# Patient Record
Sex: Female | Born: 1950 | Race: White | Hispanic: No | Smoking: Current every day smoker
Health system: Southern US, Community
[De-identification: ages and names within clinical notes are randomized; demographics above are authoritative.]

## PROBLEM LIST (undated history)

## (undated) DIAGNOSIS — C801 Malignant (primary) neoplasm, unspecified: Secondary | ICD-10-CM

## (undated) DIAGNOSIS — F419 Anxiety disorder, unspecified: Secondary | ICD-10-CM

## (undated) DIAGNOSIS — I209 Angina pectoris, unspecified: Secondary | ICD-10-CM

## (undated) DIAGNOSIS — J189 Pneumonia, unspecified organism: Secondary | ICD-10-CM

## (undated) DIAGNOSIS — I2510 Atherosclerotic heart disease of native coronary artery without angina pectoris: Secondary | ICD-10-CM

## (undated) DIAGNOSIS — I10 Essential (primary) hypertension: Secondary | ICD-10-CM

## (undated) DIAGNOSIS — M1990 Unspecified osteoarthritis, unspecified site: Secondary | ICD-10-CM

## (undated) DIAGNOSIS — K219 Gastro-esophageal reflux disease without esophagitis: Secondary | ICD-10-CM

## (undated) HISTORY — PX: THYROID LOBECTOMY: SHX420

## (undated) HISTORY — PX: CARDIAC CATHETERIZATION: SHX172

---

## 2012-09-10 ENCOUNTER — Emergency Department: Payer: Self-pay | Admitting: Emergency Medicine

## 2012-09-10 LAB — CBC
HGB: 13.8 g/dL (ref 12.0–16.0)
MCH: 26.8 pg (ref 26.0–34.0)
MCV: 80 fL (ref 80–100)
RBC: 5.14 10*6/uL (ref 3.80–5.20)
RDW: 16.3 % — ABNORMAL HIGH (ref 11.5–14.5)
WBC: 5.7 10*3/uL (ref 3.6–11.0)

## 2012-09-10 LAB — BASIC METABOLIC PANEL
Anion Gap: 8 (ref 7–16)
BUN: 6 mg/dL — ABNORMAL LOW (ref 7–18)
Calcium, Total: 8.8 mg/dL (ref 8.5–10.1)
Chloride: 104 mmol/L (ref 98–107)
Co2: 25 mmol/L (ref 21–32)
Creatinine: 0.64 mg/dL (ref 0.60–1.30)
EGFR (African American): >60
Glucose: 107 mg/dL — ABNORMAL HIGH (ref 65–99)
Potassium: 3.7 mmol/L (ref 3.5–5.1)
Sodium: 137 mmol/L (ref 136–145)

## 2012-09-10 LAB — CK TOTAL AND CKMB (NOT AT ARMC)
CK, Total: 99 U/L (ref 21–215)
CK-MB: 1.4 ng/mL (ref 0.5–3.6)

## 2012-09-10 IMAGING — CR DG CHEST 2V
1 series · 3 of 3 positions shown · non-contrast
Comparison: none

REASON FOR EXAM: SOB
COMMENTS:

PROCEDURE:     DXR - DXR CHEST PA (OR AP) AND LATERAL  - [DATE]  [DATE]
RESULT:     Comparison: None.

[Series 1: w chest pa · 0.14mm/px · 3 of 3 slices shown]
[im 1/3]
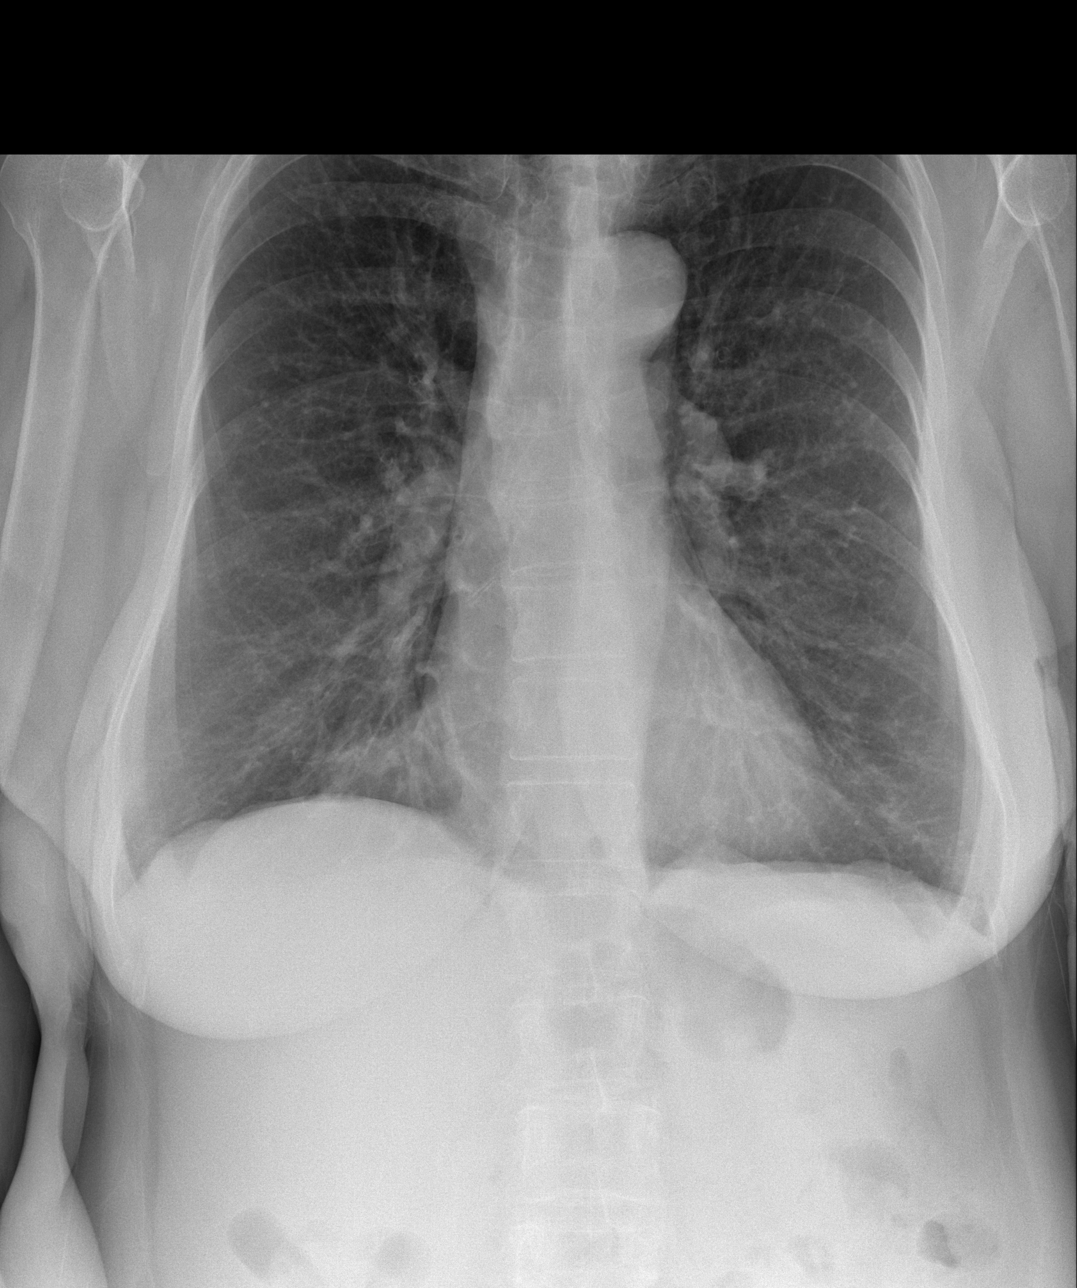
[im 2/3]
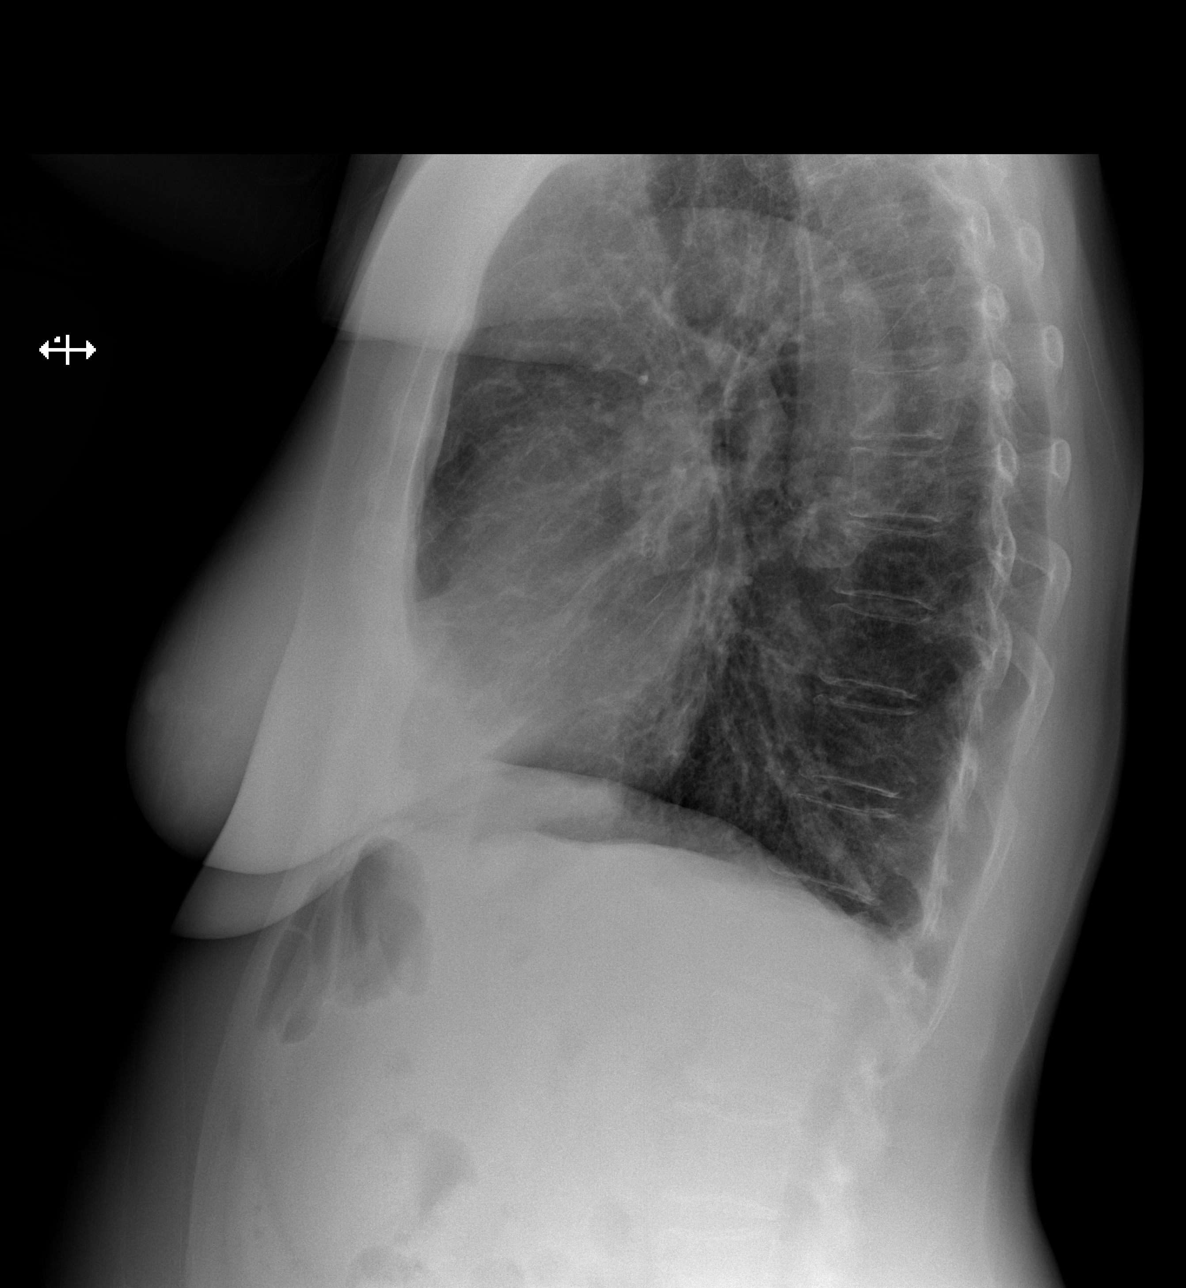
[im 3/3]
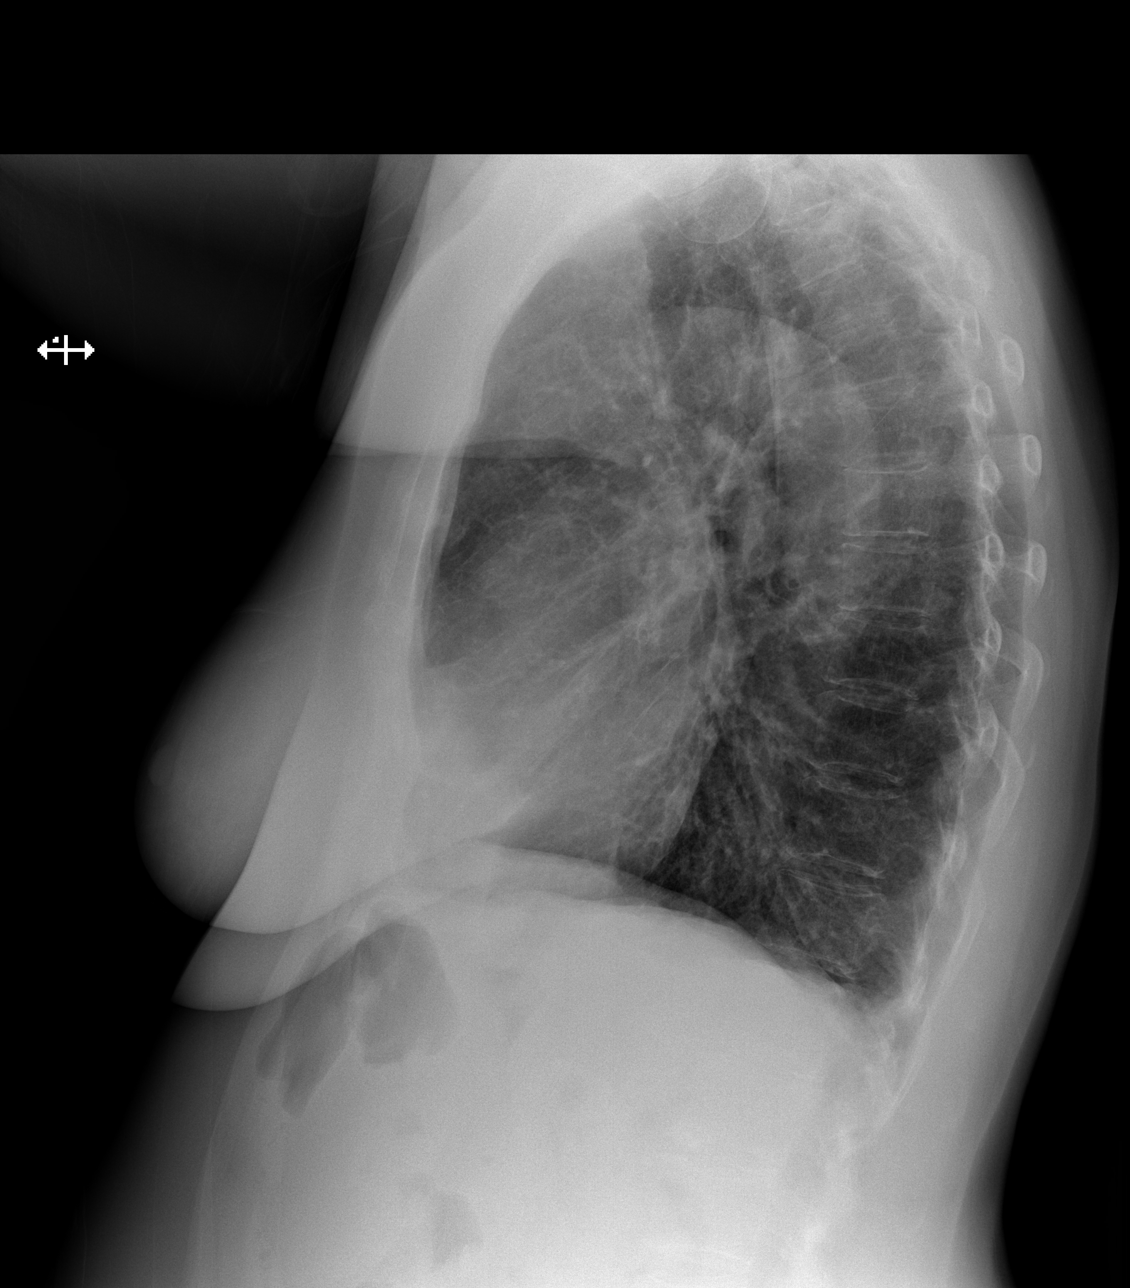

[3 of 3 positions shown; findings below may reference images not displayed]

FINDINGS: The heart and mediastinum are within normal limits. There is mild opacity in
the medial right lower lung, likely the right middle lobe. The left lung is
clear.
IMPRESSION: There is mild heterogeneous opacity in the medial right lower lung. This
could represent infection in the appropriate clinical setting. Followup PA
and lateral chest radiograph is recommended to ensure resolution.

[REDACTED]

## 2014-12-02 ENCOUNTER — Ambulatory Visit
Admit: 2014-12-02 | Disposition: A | Discharge status: Home / Self Care | Payer: Self-pay | Attending: Internal Medicine | Admitting: Internal Medicine

## 2017-05-28 ENCOUNTER — Observation Stay: Payer: Commercial Managed Care - PPO

## 2017-05-28 ENCOUNTER — Observation Stay
Admission: EM | Admit: 2017-05-28 | Discharge: 2017-05-29 | Disposition: A | Discharge status: Home / Self Care | Payer: Commercial Managed Care - PPO | Attending: Cardiology | Admitting: Cardiology

## 2017-05-28 ENCOUNTER — Encounter: Payer: Self-pay | Admitting: Emergency Medicine

## 2017-05-28 ENCOUNTER — Encounter
Admission: EM | Disposition: A | Discharge status: Home / Self Care | Payer: Self-pay | Source: Home / Self Care | Attending: Emergency Medicine

## 2017-05-28 ENCOUNTER — Emergency Department: Payer: Commercial Managed Care - PPO

## 2017-05-28 DIAGNOSIS — M069 Rheumatoid arthritis, unspecified: Secondary | ICD-10-CM | POA: Insufficient documentation

## 2017-05-28 DIAGNOSIS — J209 Acute bronchitis, unspecified: Secondary | ICD-10-CM | POA: Insufficient documentation

## 2017-05-28 DIAGNOSIS — I2510 Atherosclerotic heart disease of native coronary artery without angina pectoris: Secondary | ICD-10-CM | POA: Diagnosis not present

## 2017-05-28 DIAGNOSIS — R079 Chest pain, unspecified: Secondary | ICD-10-CM | POA: Diagnosis present

## 2017-05-28 DIAGNOSIS — J440 Chronic obstructive pulmonary disease with acute lower respiratory infection: Secondary | ICD-10-CM | POA: Insufficient documentation

## 2017-05-28 DIAGNOSIS — Z79899 Other long term (current) drug therapy: Secondary | ICD-10-CM | POA: Diagnosis not present

## 2017-05-28 DIAGNOSIS — Z885 Allergy status to narcotic agent status: Secondary | ICD-10-CM | POA: Diagnosis not present

## 2017-05-28 DIAGNOSIS — R9431 Abnormal electrocardiogram [ECG] [EKG]: Secondary | ICD-10-CM | POA: Insufficient documentation

## 2017-05-28 DIAGNOSIS — F17210 Nicotine dependence, cigarettes, uncomplicated: Secondary | ICD-10-CM | POA: Insufficient documentation

## 2017-05-28 DIAGNOSIS — E785 Hyperlipidemia, unspecified: Secondary | ICD-10-CM | POA: Insufficient documentation

## 2017-05-28 DIAGNOSIS — I10 Essential (primary) hypertension: Secondary | ICD-10-CM | POA: Insufficient documentation

## 2017-05-28 HISTORY — PX: CORONARY/GRAFT ACUTE MI REVASCULARIZATION: CATH118305

## 2017-05-28 HISTORY — DX: Unspecified osteoarthritis, unspecified site: M19.90

## 2017-05-28 HISTORY — DX: Essential (primary) hypertension: I10

## 2017-05-28 HISTORY — PX: LEFT HEART CATH AND CORONARY ANGIOGRAPHY: CATH118249

## 2017-05-28 LAB — PROTIME-INR
INR: 0.9
Prothrombin Time: 12.1 seconds (ref 11.4–15.2)

## 2017-05-28 LAB — CBC
HCT: 45.9 % (ref 35.0–47.0)
Hemoglobin: 16 g/dL (ref 12.0–16.0)
MCH: 30.1 pg (ref 26.0–34.0)
MCHC: 34.8 g/dL (ref 32.0–36.0)
MCV: 86.7 fL (ref 80.0–100.0)
PLATELETS: 351 10*3/uL (ref 150–440)
RBC: 5.3 MIL/uL — ABNORMAL HIGH (ref 3.80–5.20)
RDW: 14.6 % — ABNORMAL HIGH (ref 11.5–14.5)
WBC: 13.1 10*3/uL — ABNORMAL HIGH (ref 3.6–11.0)

## 2017-05-28 LAB — BASIC METABOLIC PANEL
Anion gap: 8 (ref 5–15)
BUN: 11 mg/dL (ref 6–20)
CO2: 24 mmol/L (ref 22–32)
CREATININE: 0.64 mg/dL (ref 0.44–1.00)
Calcium: 9.2 mg/dL (ref 8.9–10.3)
Chloride: 105 mmol/L (ref 101–111)
GFR calc Af Amer: >60 mL/min (ref >60)
GLUCOSE: 123 mg/dL — AB (ref 65–99)
POTASSIUM: 4.1 mmol/L (ref 3.5–5.1)
Sodium: 137 mmol/L (ref 135–145)

## 2017-05-28 LAB — LIPID PANEL
Cholesterol: 191 mg/dL (ref 0–200)
HDL: 43 mg/dL (ref >40)
LDL CALC: 136 mg/dL — AB (ref 0–99)
Total CHOL/HDL Ratio: 4.4 RATIO
Triglycerides: 60 mg/dL (ref <150)
VLDL: 12 mg/dL (ref 0–40)

## 2017-05-28 LAB — GLUCOSE, CAPILLARY: GLUCOSE-CAPILLARY: 122 mg/dL — AB (ref 65–99)

## 2017-05-28 LAB — MRSA PCR SCREENING: MRSA BY PCR: NEGATIVE

## 2017-05-28 LAB — APTT: aPTT: 36 seconds (ref 24–36)

## 2017-05-28 LAB — TROPONIN I

## 2017-05-28 LAB — PROCALCITONIN: Procalcitonin: <0.1 ng/mL

## 2017-05-28 IMAGING — DX DG CHEST 1V
1 series · 1 of 1 positions shown · non-contrast
Comparison: [DATE].

CLINICAL DATA: 66-year-old female with chest pain today.

EXAM:
CHEST 1 VIEW

[chest ap]
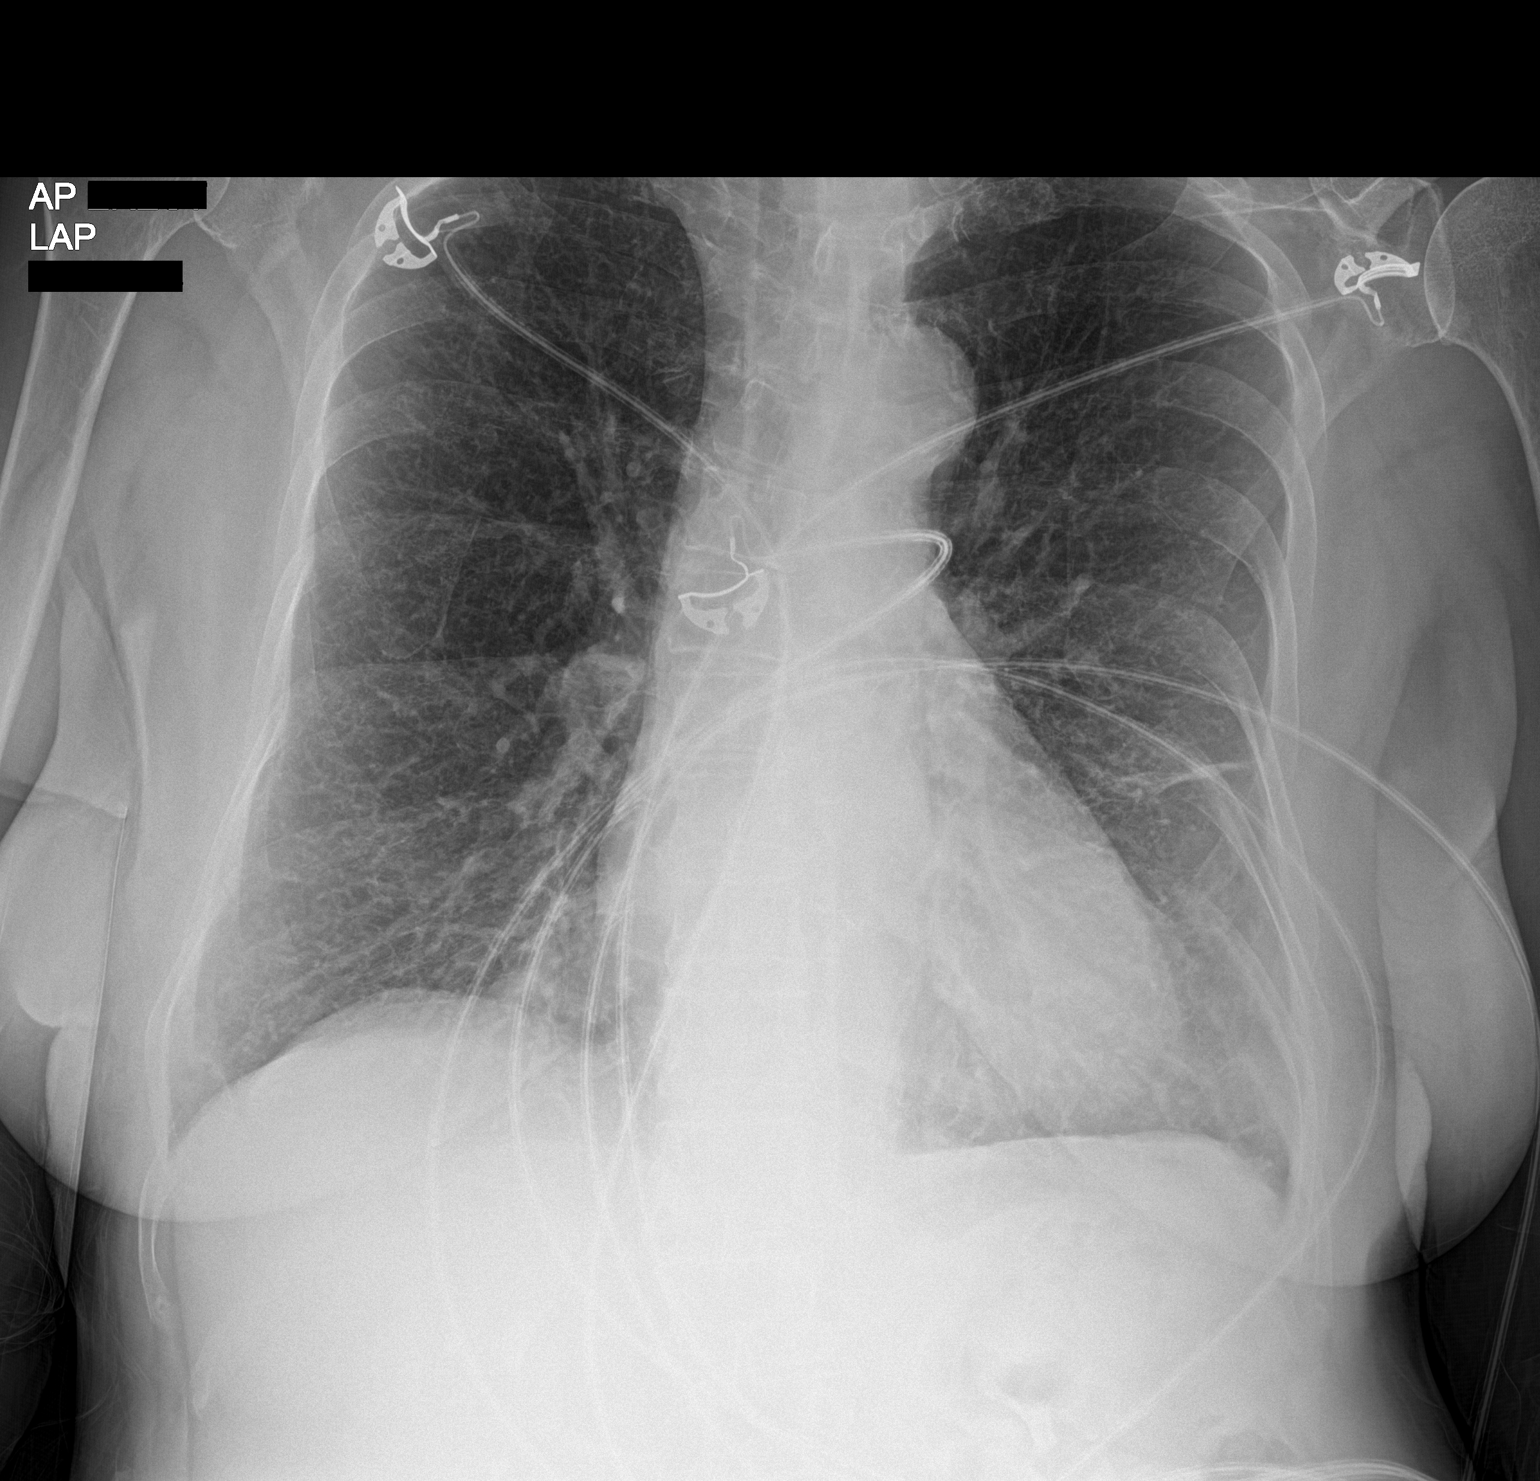

[1 of 1 positions shown; findings below may reference images not displayed]

FINDINGS: Portable AP upright view at [42] hours. Stable cardiac size at the
upper limits of normal. Other mediastinal contours are within normal
limits. Visualized tracheal air column is within normal limits.
Chronic large lung volumes. Mild increased interstitial markings in
both lungs appear increased. Mild linear atelectasis or scarring in
the left mid lung. No pneumothorax, pulmonary edema or pleural
effusion. Thoracic scoliosis. Negative visible bowel gas pattern.
IMPRESSION: 1. Increased interstitial markings in both lungs such that mild or
developing interstitial edema is difficult to exclude. Alternatively
these could be chronic, as there is evidence of hyperinflation.
2. Linear scarring or atelectasis suspected in the left mid lung.
3. Borderline to mild cardiomegaly.

## 2017-05-28 SURGERY — LEFT HEART CATH AND CORONARY ANGIOGRAPHY
Anesthesia: Moderate Sedation

## 2017-05-28 MED ORDER — MIDAZOLAM HCL 2 MG/2ML IJ SOLN
INTRAMUSCULAR | Status: AC
  Filled 2017-05-28: qty 2

## 2017-05-28 MED ORDER — AZITHROMYCIN 250 MG PO TABS: 250.0000 mg | ORAL_TABLET | Freq: Every day | ORAL | Status: DC

## 2017-05-28 MED ORDER — HEPARIN SODIUM (PORCINE) 1000 UNIT/ML IJ SOLN
4000.0000 [IU] | Freq: Once | INTRAMUSCULAR | Status: AC
  Administered 2017-05-28: 4000 [IU] via INTRAVENOUS

## 2017-05-28 MED ORDER — ASPIRIN 300 MG RE SUPP
300.0000 mg | RECTAL | Status: AC
  Filled 2017-05-28: qty 1

## 2017-05-28 MED ORDER — ACETAMINOPHEN 325 MG PO TABS: 650.0000 mg | ORAL_TABLET | ORAL | Status: DC | PRN

## 2017-05-28 MED ORDER — ENOXAPARIN SODIUM 40 MG/0.4ML ~~LOC~~ SOLN
40.0000 mg | SUBCUTANEOUS | Status: DC
  Administered 2017-05-28 – 2017-05-29 (×2): 40 mg via SUBCUTANEOUS
  Filled 2017-05-28 (×2): qty 0.4

## 2017-05-28 MED ORDER — HEPARIN SODIUM (PORCINE) 5000 UNIT/ML IJ SOLN: 60.0000 [IU]/kg | Freq: Once | INTRAMUSCULAR | Status: DC

## 2017-05-28 MED ORDER — SODIUM CHLORIDE 0.9 % WEIGHT BASED INFUSION: 1.0000 mL/kg/h | INTRAVENOUS | Status: DC

## 2017-05-28 MED ORDER — SODIUM CHLORIDE 0.9 % IV SOLN: INTRAVENOUS | Status: DC

## 2017-05-28 MED ORDER — NITROGLYCERIN 0.4 MG SL SUBL
0.4000 mg | SUBLINGUAL_TABLET | Freq: Once | SUBLINGUAL | Status: AC
  Administered 2017-05-28: 0.4 mg via SUBLINGUAL

## 2017-05-28 MED ORDER — NITROGLYCERIN 5 MG/ML IV SOLN
INTRAVENOUS | Status: AC
  Filled 2017-05-28: qty 10

## 2017-05-28 MED ORDER — SODIUM CHLORIDE 0.9 % IV SOLN: 250.0000 mL | INTRAVENOUS | Status: DC | PRN

## 2017-05-28 MED ORDER — IBUPROFEN 400 MG PO TABS
400.0000 mg | ORAL_TABLET | Freq: Four times a day (QID) | ORAL | Status: DC | PRN
  Administered 2017-05-28: 400 mg via ORAL
  Filled 2017-05-28: qty 1

## 2017-05-28 MED ORDER — ONDANSETRON HCL 4 MG/2ML IJ SOLN
4.0000 mg | Freq: Four times a day (QID) | INTRAMUSCULAR | Status: DC | PRN
  Administered 2017-05-28: 4 mg via INTRAVENOUS
  Filled 2017-05-28: qty 2

## 2017-05-28 MED ORDER — SODIUM CHLORIDE 0.9% FLUSH: 3.0000 mL | INTRAVENOUS | Status: DC | PRN

## 2017-05-28 MED ORDER — ASPIRIN 81 MG PO CHEW
324.0000 mg | CHEWABLE_TABLET | Freq: Once | ORAL | Status: AC
  Administered 2017-05-28: 324 mg via ORAL

## 2017-05-28 MED ORDER — ACETAMINOPHEN 325 MG PO TABS
650.0000 mg | ORAL_TABLET | Freq: Four times a day (QID) | ORAL | Status: DC | PRN
  Administered 2017-05-29: 650 mg via ORAL
  Filled 2017-05-28: qty 2

## 2017-05-28 MED ORDER — SODIUM CHLORIDE 0.9 % IV SOLN
INTRAVENOUS | Status: DC
  Administered 2017-05-28: 07:00:00 via INTRAVENOUS

## 2017-05-28 MED ORDER — ALBUTEROL SULFATE (2.5 MG/3ML) 0.083% IN NEBU: 2.5000 mg | INHALATION_SOLUTION | RESPIRATORY_TRACT | Status: DC | PRN

## 2017-05-28 MED ORDER — ASPIRIN 81 MG PO CHEW: 324.0000 mg | CHEWABLE_TABLET | ORAL | Status: AC

## 2017-05-28 MED ORDER — SODIUM CHLORIDE 0.9% FLUSH
3.0000 mL | Freq: Two times a day (BID) | INTRAVENOUS | Status: DC
  Administered 2017-05-28 – 2017-05-29 (×2): 3 mL via INTRAVENOUS

## 2017-05-28 MED ORDER — ACETAMINOPHEN 650 MG RE SUPP
650.0000 mg | Freq: Four times a day (QID) | RECTAL | Status: DC | PRN
  Filled 2017-05-28: qty 1

## 2017-05-28 MED ORDER — IOPAMIDOL (ISOVUE-300) INJECTION 61%
INTRAVENOUS | Status: DC | PRN
  Administered 2017-05-28: 110 mL via INTRA_ARTERIAL

## 2017-05-28 MED ORDER — ATORVASTATIN CALCIUM 20 MG PO TABS
40.0000 mg | ORAL_TABLET | Freq: Every day | ORAL | Status: DC
  Administered 2017-05-28: 40 mg via ORAL
  Filled 2017-05-28: qty 2

## 2017-05-28 MED ORDER — AZITHROMYCIN 500 MG PO TABS
500.0000 mg | ORAL_TABLET | Freq: Every day | ORAL | Status: DC
  Filled 2017-05-28: qty 1

## 2017-05-28 MED ORDER — GUAIFENESIN 100 MG/5ML PO SOLN
5.0000 mL | ORAL | Status: DC | PRN
Start: 2017-05-28 — End: 2017-05-29
  Administered 2017-05-28 – 2017-05-29 (×3): 100 mg via ORAL
  Filled 2017-05-28 (×5): qty 5

## 2017-05-28 MED ORDER — FENTANYL CITRATE (PF) 100 MCG/2ML IJ SOLN
INTRAMUSCULAR | Status: AC
  Filled 2017-05-28: qty 2

## 2017-05-28 MED ORDER — COLCHICINE 0.6 MG PO TABS
0.6000 mg | ORAL_TABLET | Freq: Every day | ORAL | Status: DC
  Administered 2017-05-28 – 2017-05-29 (×2): 0.6 mg via ORAL
  Filled 2017-05-28 (×3): qty 1

## 2017-05-28 MED ORDER — HEPARIN (PORCINE) IN NACL 2-0.9 UNIT/ML-% IJ SOLN
INTRAMUSCULAR | Status: AC
  Filled 2017-05-28: qty 1000

## 2017-05-28 SURGICAL SUPPLY — 11 items
CATH 5FR JL4 DIAGNOSTIC (CATHETERS) ×2 IMPLANT
CATH INFINITI 5FR ANG PIGTAIL (CATHETERS) ×2 IMPLANT
CATH INFINITI JR4 5F (CATHETERS) ×2 IMPLANT
DEVICE CLOSURE MYNXGRIP 6/7F (Vascular Products) ×2 IMPLANT
DEVICE INFLAT 30 PLUS (MISCELLANEOUS) IMPLANT
DEVICE SAFEGUARD 24CM (GAUZE/BANDAGES/DRESSINGS) ×2 IMPLANT
KIT MANI 3VAL PERCEP (MISCELLANEOUS) ×2 IMPLANT
NEEDLE PERC 18GX7CM (NEEDLE) ×2 IMPLANT
PACK CARDIAC CATH (CUSTOM PROCEDURE TRAY) ×2 IMPLANT
SHEATH AVANTI 6FR X 11CM (SHEATH) ×2 IMPLANT
WIRE EMERALD 3MM-J .035X150CM (WIRE) ×2 IMPLANT

## 2017-05-28 NOTE — ED Provider Notes (Signed)
[facility] Emergency Department Provider Note       Time seen: ----------------------------------------- 7:07 AM on 05/28/2017 -----------------------------------------     I have reviewed the triage vital signs and the nursing notes.   HISTORY   Chief Complaint Generalized Body Aches; Chest Pain; Cough; and Fever    HPI Kathleen Blankenship is a 66 y.o. female who presents to the ED for cough, chest pain, fever and general body aches for the last month worse over the last 2 weeks. Patient reports she's been experiencing respiratory symptoms on and off and is not having a fever. Patient is concerned she may have pneumonia. She reports last night she began having chest pain that radiated into her back that is mild to moderate. She does not normally get chest pain. She typically has pain from time to time with rheumatoid arthritis.  Past Medical History:  Diagnosis Date  . Arthritis   . Hypertension     There are no active problems to display for this patient.   Past Surgical History:  Procedure Laterality Date  . THYROID LOBECTOMY      Allergies Codeine  Social History Social History  Substance Use Topics  . Smoking status: Heavy Tobacco Smoker  . Smokeless tobacco: Never Used  . Alcohol use No    Review of Systems Constitutional: Negative for fever. Eyes: Negative for vision changes ENT:  positive for congestion Cardiovascular: positive for chest pain Respiratory: Negative for shortness of breath.positive for cough Gastrointestinal: Negative for abdominal pain, vomiting and diarrhea. Genitourinary: Negative for dysuria. Musculoskeletal: positive for back pain Skin: Negative for rash. Neurological: Negative for headaches, focal weakness or numbness.  All systems negative/normal/unremarkable except as stated in the HPI  ____________________________________________   PHYSICAL EXAM:  VITAL SIGNS: ED Triage Vitals  Enc Vitals Group      BP 05/28/17 0643 (!) 163/88     Pulse Rate 05/28/17 0643 (!) 102     Resp 05/28/17 0643 20     Temp 05/28/17 0643 98.4 F (36.9 C)     Temp Source 05/28/17 0643 Oral     SpO2 05/28/17 0643 95 %     Weight 05/28/17 0643 142 lb (64.4 kg)     Height 05/28/17 0643 5\' 2"  (1.575 m)     Head Circumference --      Peak Flow --      Pain Score 05/28/17 0642 7     Pain Loc --      Pain Edu? --      Excl. in [place]? --     Constitutional: Alert and oriented. Well appearing and in no distress. Eyes: Conjunctivae are normal. Normal extraocular movements. ENT   Head: Normocephalic and atraumatic.   Nose: No congestion/rhinnorhea.   Mouth/Throat: Mucous membranes are moist.   Neck: No stridor. Cardiovascular: rapid rate, regular rhythm. No murmurs, rubs, or gallops. Respiratory: Normal respiratory effort without tachypnea nor retractions. Breath sounds are clear and equal bilaterally. No wheezes/rales/rhonchi. Gastrointestinal: Soft and nontender. Normal bowel sounds Musculoskeletal: Nontender with normal range of motion in extremities. No lower extremity tenderness nor edema. Neurologic:  Normal speech and language. No gross focal neurologic deficits are appreciated.  Skin:  Skin is warm, dry and intact. No rash noted. Psychiatric: Mood and affect are normal. Speech and behavior are normal.  ____________________________________________  EKG: Interpreted by me.sinus tachycardia with a rate of 101 bpm, normal PR interval, normal QRS, normal QT, there is ST elevation most in the inferior leads but  also some in the anterior leads.  Repeat EKG interpreted by me with ST elevation worsening inferior leads, rate is 101 bpm, normal PR interval, normal QRS size  ____________________________________________  ED COURSE:  Pertinent labs & imaging results that were available during my care of the patient were reviewed by me and considered in my medical decision making (see chart for  details). Patient presents for symptoms of general malaise and chest pain, we will assess with labs and imaging as indicated. EKG is concerning for ST elevation MI, I will consult cardiology. We will initially treat with aspirin and heparin.   Procedures ____________________________________________   LABS (pertinent positives/negatives)  Labs Reviewed  BASIC METABOLIC PANEL  CBC  TROPONIN I  PROTIME-INR  APTT  LIPID PANEL   CRITICAL CARE Performed by: Earleen Newport   Total critical care time: 30 minutes  Critical care time was exclusive of separately billable procedures and treating other patients.  Critical care was necessary to treat or prevent imminent or life-threatening deterioration.  Critical care was time spent personally by me on the following activities: development of treatment plan with patient and/or surrogate as well as nursing, discussions with consultants, evaluation of patient's response to treatment, examination of patient, obtaining history from patient or surrogate, ordering and performing treatments and interventions, ordering and review of laboratory studies, ordering and review of radiographic studies, pulse oximetry and re-evaluation of patient's condition.  RADIOLOGY  chest x-ray Is pending ____________________________________________  DIFFERENTIAL DIAGNOSIS   STEMI, pericarditis, dissection, PE, pneumonia   FINAL ASSESSMENT AND PLAN  chest pain, abnormal EKG   Plan: Patient's labs and imaging were dictated above. Patient had presented for chest pain with abnormal EKG concerning for ST elevation MI. She looks very well clinically and her story is not consistent with most [place], however due to the abnormal EKG she will likely need a catheter. Cardiology agrees with same. This may end up being pericarditis, however she was given aspirin, heparin bolus and a dose of nitroglycerin. She remains stable for cardiac catheterization at this  time.   Earleen Newport, MD   Note: This note was generated in part or whole with voice recognition software. Voice recognition is usually quite accurate but there are transcription errors that can and very often do occur. I apologize for any typographical errors that were not detected and corrected.     Earleen Newport, MD 05/28/17 [phone]

## 2017-05-28 NOTE — ED Notes (Signed)
Dr. Saralyn Pilar at bedside at this time.

## 2017-05-28 NOTE — ED Triage Notes (Signed)
Pt arrived to the ED for complaints of cough, chest pain, fever and generalized body aches x 1 month. Pt reports that she has been experiencing upper respiratory infection symptoms on and off for the last moth and now is having a fever. Pt states that she believes that she has pneumonia. Pt is AOx4 in no apparent distress.

## 2017-05-28 NOTE — Progress Notes (Signed)
Patient Transferred from CCU. Ambulated to the bathroom unassisted. No tele orders noted. VSS. No needs expressed at this time, resting quietly with family at bedside bed in the lowest position and call light in reach will continue to monitor.

## 2017-05-28 NOTE — Progress Notes (Signed)
66 year old female with prolonged episode of chest pain, and ECG suggestive of inferior ST elevation infarction  S/p PCI   VS stable.  Post cath protocol

## 2017-05-28 NOTE — ED Notes (Signed)
Dr Williams at bedside 

## 2017-05-28 NOTE — Consult Note (Signed)
[facility]  CARDIOLOGY CONSULT NOTE  Patient ID: Kathleen Blankenship MRN: [id number] DOB/AGE: May 13, 1951 66 y.o.  Admit date: 05/28/2017 Referring Physician Jimmye Norman Primary Physician [facility] Primary Cardiologist  Reason for ConsultationChest pain  HPI: 66 year old female referred for evaluation of chest pain and abnormal ECG suggestive of inferior ST elevation myocardial infarction. The patient reports a two-week history of intermittent shortness of breath, mild fever. Last evening, she developed 7 out of 10 substernal chest discomfort which radiated to her mid back. The patient reports chest pain is exacerbated by deep breathing or cough. She presented to [facility] ED where ECG revealed ST elevations in leads II, III, and F and aVF suggestive of inferior ST elevation myocardial infarction.  Review of systems complete and found to be negative unless listed above     Past Medical History:  Diagnosis Date  . Arthritis   . Hypertension     Past Surgical History:  Procedure Laterality Date  . THYROID LOBECTOMY      No prescriptions prior to admission.   Social History   Social History  . Marital status: Divorced    Spouse name: N/A  . Number of children: N/A  . Years of education: N/A   Occupational History  . Not on file.   Social History Main Topics  . Smoking status: Heavy Tobacco Smoker  . Smokeless tobacco: Never Used  . Alcohol use No  . Drug use: No  . Sexual activity: No   Other Topics Concern  . Not on file   Social History Narrative  . No narrative on file    History reviewed. No pertinent family history.    Review of systems complete and found to be negative unless listed above      PHYSICAL EXAM  General: Well developed, well nourished, in no acute distress HEENT:  Normocephalic and atramatic Neck:  No JVD.  Lungs: Clear bilaterally to auscultation and percussion. Heart: HRRR . Normal S1 and S2 without gallops or murmurs.  Abdomen: Bowel sounds are  positive, abdomen soft and non-tender  Msk:  Back normal, normal gait. Normal strength and tone for age. Extremities: No clubbing, cyanosis or edema.   Neuro: Alert and oriented X 3. Psych:  Good affect, responds appropriately  Labs:   Lab Results  Component Value Date   WBC 13.1 (H) 05/28/2017   HGB 16.0 05/28/2017   HCT 45.9 05/28/2017   MCV 86.7 05/28/2017   PLT 351 05/28/2017    Recent Labs Lab 05/28/17 0655  NA 137  K 4.1  CL 105  CO2 24  BUN 11  CREATININE 0.64  CALCIUM 9.2  GLUCOSE 123*   Lab Results  Component Value Date   CKTOTAL 99 09/10/2012   CKMB 1.4 09/10/2012   TROPONINI <0.03 05/28/2017   No results found for: CHOL No results found for: HDL No results found for: LDLCALC No results found for: TRIG No results found for: CHOLHDL No results found for: LDLDIRECT    Radiology: No results found.  IRW:ERXVQM sinus rhythm, 1-2 mm ST elevation leads 2,3 aVF  ASSESSMENT AND PLAN:   16. 66 year old female with prolonged episode of chest pain, and ECG suggestive of inferior ST elevation infarction  Recommendations  1. Emergent cardiac catheterization and possible primary PCI. The risks, benefits alternatives to cardiac catheterization, and PCI were explained to the patient and informed consent was obtained.  Signed: Isaias Cowman MD,PhD, [facility] 05/28/2017, 8:22 AM

## 2017-05-28 NOTE — H&P (Signed)
[place] [facility] [place] NAME: Kathleen Blankenship    MR#:  [id number]  DATE OF BIRTH:  02-23-1951  DATE OF ADMISSION:  05/28/2017  PRIMARY CARE PHYSICIAN: Tracie Harrier, MD   REQUESTING/REFERRING PHYSICIAN: dr. Lenise Arena  CHIEF COMPLAINT:chest pain   Chief Complaint  Patient presents with  . Generalized Body Aches  . Chest Pain  . Cough  . Fever    HISTORY OF PRESENT ILLNESS:  Kathleen Blankenship  is a 66 y.o. female with a known history of COPD, rheumatoid arthritis on Remicade comes in with chest pain on the left side back since this morning. Patient has been having flu symptoms for 1 week associated with cough and phlegm. Patient noted to have ST elevations in lead2, lead 3, aVF suggesting inferior ST elevation so hold ST MI called.hypotension and immediately taken to cardiac catheter, cardiac catheter revealed normal coronaries. Patient still has chest pain and cardiology felt patient may be having pericarditis. She also has cough, elevated white count and case number pneumonia in the left lung.  PAST MEDICAL HISTORY:   Past Medical History:  Diagnosis Date  . Arthritis    RA  . Hypertension     PAST SURGICAL HISTOIRY:   Past Surgical History:  Procedure Laterality Date  . CORONARY/GRAFT ACUTE MI REVASCULARIZATION N/A 05/28/2017   Procedure: Coronary/Graft Acute MI Revascularization;  Surgeon: Isaias Cowman, MD;  Location: [place] [facility];  Service: Cardiovascular;  Laterality: N/A;  . LEFT HEART CATH AND CORONARY ANGIOGRAPHY N/A 05/28/2017   Procedure: LEFT HEART CATH AND CORONARY ANGIOGRAPHY;  Surgeon: Isaias Cowman, MD;  Location: [place] [facility];  Service: Cardiovascular;  Laterality: N/A;  . THYROID LOBECTOMY      SOCIAL HISTORY:   Social History  Substance Use Topics  . Smoking status: Heavy Tobacco Smoker  . Smokeless tobacco: Never Used  . Alcohol use No    FAMILY HISTORY:  History  reviewed. No pertinent family history.  DRUG ALLERGIES:   Allergies  Allergen Reactions  . Codeine Hives    REVIEW OF SYSTEMS:  CONSTITUTIONAL: No fever, fatigue or weakness.  EYES: No blurred or double vision.  EARS, NOSE, AND THROAT: No tinnitus or ear pain.  RESPIRATORY:cough, shortness of breath, pleuritic chest pain. CARDIOVASCULAR: No chest pain, orthopnea, edema.  GASTROINTESTINAL: No nausea, vomiting, diarrhea or abdominal pain.  GENITOURINARY: No dysuria, hematuria.  ENDOCRINE: No polyuria, nocturia,  HEMATOLOGY: No anemia, easy bruising or bleeding SKIN: No rash or lesion. MUSCULOSKELETAL: No joint pain or arthritis.   NEUROLOGIC: No tingling, numbness, weakness.  PSYCHIATRY: No anxiety or depression.   MEDICATIONS AT HOME:   Prior to Admission medications   Medication Sig Start Date End Date Taking? Authorizing Provider  diphenhydramine-acetaminophen (TYLENOL PM) 25-500 MG TABS tablet Take 1 tablet by mouth at bedtime as needed.   Yes [provider]  inFLIXimab (REMICADE) 100 MG injection Inject 100 mg into the vein.   Yes [provider]  leflunomide (ARAVA) 20 MG tablet Take 20 mg by mouth daily.   Yes [provider]  losartan (COZAAR) 50 MG tablet Take 50 mg by mouth daily.   Yes [provider]  metoprolol tartrate (LOPRESSOR) 50 MG tablet Take 50 mg by mouth once.   Yes [provider]  sertraline (ZOLOFT) 50 MG tablet Take 50 mg by mouth at bedtime.   Yes [provider]  traMADol (ULTRAM) 50 MG tablet Take 50 mg by mouth every 6 (  six) hours as needed.   Yes [provider]      VITAL SIGNS:  Blood pressure 126/77, pulse 99, temperature 98.6 F (37 C), temperature source Oral, resp. rate (!) 25, height 5\' 2"  (1.575 m), weight 63.9 kg (140 lb 14 oz), SpO2 94 %.  PHYSICAL EXAMINATION:  GENERAL:  66 y.o.-year-old patient lying in the bed with no acute distress.  EYES: Pupils equal, round,  reactive to light  . No scleral icterus. Extraocular muscles intact.  HEENT: Head atraumatic, normocephalic. Oropharynx and nasopharynx clear.  NECK:  Supple, no jugular venous distention. No thyroid enlargement, no tenderness.  LUNGS: Faint expiratory wheeze bilaterally. CARDIOVASCULAR: S1, S2 normal. No murmurs, rubs, or gallops.  ABDOMEN: Soft, nontender, nondistended. Bowel sounds present. No organomegaly or mass.  EXTREMITIES: No pedal edema, cyanosis, or clubbing.  NEUROLOGIC: Cranial nerves II through XII are intact. Muscle strength 5/5 in all extremities. Sensation intact. Gait not checked.  PSYCHIATRIC: The patient is alert and oriented x 3.  SKIN: No obvious rash, lesion, or ulcer.   LABORATORY PANEL:   CBC  Recent Labs Lab 05/28/17 0655  WBC 13.1*  HGB 16.0  HCT 45.9  PLT 351   ------------------------------------------------------------------------------------------------------------------  Chemistries   Recent Labs Lab 05/28/17 0655  NA 137  K 4.1  CL 105  CO2 24  GLUCOSE 123*  BUN 11  CREATININE 0.64  CALCIUM 9.2   ------------------------------------------------------------------------------------------------------------------  Cardiac Enzymes  Recent Labs Lab 05/28/17 0655  TROPONINI <0.03   ------------------------------------------------------------------------------------------------------------------  RADIOLOGY:  Dg Chest 1 View  Result Date: 05/28/2017 CLINICAL DATA:  66 year old female with chest pain today. EXAM: CHEST 1 VIEW COMPARISON:  09/10/2012. FINDINGS: Portable AP upright view at 0854 hours. Stable cardiac size at the upper limits of normal. Other mediastinal contours are within normal limits. Visualized tracheal air column is within normal limits. Chronic large lung volumes. Mild increased interstitial markings in both lungs appear increased. Mild linear atelectasis or scarring in the left mid lung. No pneumothorax, pulmonary edema  or pleural effusion. Thoracic scoliosis. Negative visible bowel gas pattern. IMPRESSION: 1. Increased interstitial markings in both lungs such that mild or developing interstitial edema is difficult to exclude. Alternatively these could be chronic, as there is evidence of hyperinflation. 2. Linear scarring or atelectasis suspected in the left mid lung. 3. Borderline to mild cardiomegaly. Electronically Signed   By: Genevie Ann M.D.   On: 05/28/2017 09:26    EKG:   Orders placed or performed during the hospital encounter of 05/28/17  . ED EKG within 10 minutes  . ED EKG within 10 minutes  . EKG 12-Lead  . EKG 12-Lead    IMPRESSION AND PLAN:   #1 #1 left-sided chest pain with EKG changes: Patient cardiac catheter is normal. Transfer the patient out of ICU to regular floor #2 pleuritic chest pain likely secondary to pneumonia and possible pericarditis. Continue azithromycin and add bronchodilators,  #3 possible pericarditis with history of rheumatoid arthritis: addcolchicine.ibuprofen  #4/tobacco abuse: Smokes 1 pack of cigarettes a day: Counseled to quit.  D/w daughter CODE STATUS:full  TOTAL TIME TAKING CARE OF THIS PATIENT: 55 minutes.    Epifanio Lesches M.D on 05/28/2017 at 2:22 PM  Between 7am to 6pm - Pager - [phone]  After 6pm go to [email or web address] - password [email or web address] [facility]  Office  [phone]  CC: Primary care physician; Tracie Harrier, MD  Note: This dictation was prepared with Dragon dictation along with smaller phrase technology. Any transcriptional errors that  result from this process are unintentional.

## 2017-05-28 NOTE — ED Notes (Signed)
EDP at bedside  

## 2017-05-29 LAB — CBC
HCT: 39.6 % (ref 35.0–47.0)
Hemoglobin: 13.8 g/dL (ref 12.0–16.0)
MCH: 30.5 pg (ref 26.0–34.0)
MCHC: 34.9 g/dL (ref 32.0–36.0)
MCV: 87.4 fL (ref 80.0–100.0)
PLATELETS: 290 10*3/uL (ref 150–440)
RBC: 4.53 MIL/uL (ref 3.80–5.20)
RDW: 14.1 % (ref 11.5–14.5)
WBC: 8.7 10*3/uL (ref 3.6–11.0)

## 2017-05-29 LAB — PROCALCITONIN: PROCALCITONIN: 0.1 ng/mL

## 2017-05-29 LAB — BASIC METABOLIC PANEL
ANION GAP: 5 (ref 5–15)
BUN: 11 mg/dL (ref 6–20)
CO2: 27 mmol/L (ref 22–32)
CREATININE: 0.48 mg/dL (ref 0.44–1.00)
Calcium: 8.5 mg/dL — ABNORMAL LOW (ref 8.9–10.3)
Chloride: 106 mmol/L (ref 101–111)
GLUCOSE: 111 mg/dL — AB (ref 65–99)
Potassium: 3.6 mmol/L (ref 3.5–5.1)
Sodium: 138 mmol/L (ref 135–145)

## 2017-05-29 LAB — GLUCOSE, CAPILLARY: GLUCOSE-CAPILLARY: 101 mg/dL — AB (ref 65–99)

## 2017-05-29 MED ORDER — ATORVASTATIN CALCIUM 40 MG PO TABS: 40.0000 mg | ORAL_TABLET | Freq: Every day | ORAL | 0 refills | Status: DC

## 2017-05-29 MED ORDER — AZITHROMYCIN 250 MG PO TABS: ORAL_TABLET | ORAL | 0 refills | Status: AC

## 2017-05-29 MED ORDER — DEXTROSE 5 % IV SOLN
1.0000 g | INTRAVENOUS | Status: DC
  Administered 2017-05-29: 1 g via INTRAVENOUS
  Filled 2017-05-29 (×2): qty 10

## 2017-05-29 MED ORDER — ALBUTEROL SULFATE HFA 108 (90 BASE) MCG/ACT IN AERS: 2.0000 | INHALATION_SPRAY | Freq: Four times a day (QID) | RESPIRATORY_TRACT | 2 refills | Status: AC | PRN

## 2017-05-29 MED ORDER — COLCHICINE 0.6 MG PO TABS: 0.6000 mg | ORAL_TABLET | Freq: Every day | ORAL | 0 refills | Status: DC

## 2017-05-29 MED ORDER — LOSARTAN POTASSIUM 50 MG PO TABS
50.0000 mg | ORAL_TABLET | Freq: Every day | ORAL | Status: DC
  Administered 2017-05-29: 50 mg via ORAL
  Filled 2017-05-29: qty 1

## 2017-05-29 MED ORDER — METOPROLOL SUCCINATE ER 50 MG PO TB24
50.0000 mg | ORAL_TABLET | Freq: Every day | ORAL | Status: DC
  Administered 2017-05-29: 50 mg via ORAL
  Filled 2017-05-29: qty 1

## 2017-05-29 MED ORDER — DEXTROSE 5 % IV SOLN
500.0000 mg | INTRAVENOUS | Status: DC
  Administered 2017-05-29: 500 mg via INTRAVENOUS
  Filled 2017-05-29 (×2): qty 500

## 2017-05-29 NOTE — Progress Notes (Signed)
Candida Peeling Trotta to be D/C'd Home per MD order. Patient given discharge teaching and paperwork regarding medications, diet, follow-up appointments and activity. Patient understanding verbalized. No questions or complaints at this time. Skin condition as charted. IV and telemetry removed prior to leaving.  No further needs by Care Management/Social Work. Prescriptions given to patient.  Allergies as of 05/29/2017      Reactions   Codeine Hives      Medication List    STOP taking these medications   diphenhydramine-acetaminophen 25-500 MG Tabs tablet Commonly known as:  TYLENOL PM   metoprolol tartrate 50 MG tablet Commonly known as:  LOPRESSOR     TAKE these medications   albuterol 108 (90 Base) MCG/ACT inhaler Commonly known as:  PROVENTIL HFA;VENTOLIN HFA Inhale 2 puffs into the lungs every 6 (six) hours as needed for wheezing or shortness of breath.   atorvastatin 40 MG tablet Commonly known as:  LIPITOR Take 1 tablet (40 mg total) by mouth daily at 6 PM.   azithromycin 250 MG tablet Commonly known as:  ZITHROMAX Z-PAK Take 2 tablets (500 mg) on  Day 1,  followed by 1 tablet (250 mg) once daily on Days 2 through 5.   colchicine 0.6 MG tablet Take 1 tablet (0.6 mg total) by mouth daily.   inFLIXimab 100 MG injection Commonly known as:  REMICADE Inject 100 mg into the vein.   leflunomide 20 MG tablet Commonly known as:  ARAVA Take 20 mg by mouth daily.   losartan 50 MG tablet Commonly known as:  COZAAR Take 50 mg by mouth daily.   sertraline 50 MG tablet Commonly known as:  ZOLOFT Take 50 mg by mouth at bedtime.   traMADol 50 MG tablet Commonly known as:  ULTRAM Take 50 mg by mouth every 6 (six) hours as needed.          An After Visit Summary was printed and given to the patient. Caregiver/family present during discharge teaching.   Patient escorted via wheelchair  Terrilyn Saver

## 2017-05-29 NOTE — Progress Notes (Signed)
Patient asking about home BP meds that have not been ordered as inpatient. Dr. Ubaldo Glassing notified - MD to place orders. Instructed to d/c telemetry.

## 2017-05-30 LAB — HIV ANTIBODY (ROUTINE TESTING W REFLEX): HIV SCREEN 4TH GENERATION: NONREACTIVE

## 2017-06-06 NOTE — Discharge Summary (Signed)
Kathleen Blankenship, is a 66 y.o. female  DOB [birth year]  MRN [id number].  Admission date:  05/28/2017  Admitting Physician  Isaias Cowman, MD  Discharge Date:  05/29/2017   Primary MD  Tracie Harrier, MD  Recommendations for primary care physician for things to follow:   Follow-up with PCP in one week.   Admission Diagnosis  Abnormal EKG [R94.31] Chest pain, unspecified type [R07.9]   Discharge Diagnosis  Abnormal EKG [R94.31] Chest pain, unspecified type [R07.9]    Active Problems:   Chest pain on exertion   Chest pain      Past Medical History:  Diagnosis Date  . Arthritis    RA  . Hypertension     Past Surgical History:  Procedure Laterality Date  . CORONARY/GRAFT ACUTE MI REVASCULARIZATION N/A 05/28/2017   Procedure: Coronary/Graft Acute MI Revascularization;  Surgeon: Isaias Cowman, MD;  Location: [facility];  Service: Cardiovascular;  Laterality: N/A;  . LEFT HEART CATH AND CORONARY ANGIOGRAPHY N/A 05/28/2017   Procedure: LEFT HEART CATH AND CORONARY ANGIOGRAPHY;  Surgeon: Isaias Cowman, MD;  Location: [facility];  Service: Cardiovascular;  Laterality: N/A;  . THYROID LOBECTOMY         History of present illness and  Hospital Course:     Kindly see H&P for history of present illness and admission details, please review complete Labs, Consult reports and Test reports for all details in brief  HPI  from the history and physical done on the day of admission 66 year old female patient with history of COPD, rheumatoid arthritis on Remicade comes in with left-sided chest pain and found to have ST elevation MI in lead 2, lead 3, aVF, initially activated code STEMI protocol. Patient is taken to cardiac catheter by Dr. Saralyn Pilar, cardiac catheter showed normal coronaries,  admitted to ICU after the cardiac catheter.   Hospital Course  #1 left-sided chest pain with EKG changes: Normal coronaries by cardiac catheterization.. Admitted initially to ICU, thought  have pericarditis causing ST elevation MI. Started on colchicine. Transfer out of ICU to telemetry.  #2 acute bronchitis,; disabled IV Rocephin, Zithromax, discharged home with antibiotics. Patient smokes at home advised to quit smoking. Advised to use albuterol inhalers.   #3. Hyperlipidemia: Patient's LDL is 36. Triglycerides 60. Started on statins. #4 history of rheumatoid arthritis: Continue her home dose Remicade, leflunomide, follow up with her rheumatologist.  Discharge Condition: stable   Follow UP  Follow-up Information    Tracie Harrier, MD On 06/06/2017.   Specialty:  Internal Medicine Why:  Appointment Time: 9:15am Contact information: [address] [place] [address] [phone]             Discharge Instructions  and  Discharge Medications     Allergies as of 05/29/2017      Reactions   Codeine Hives      Medication List    STOP taking these medications   diphenhydramine-acetaminophen 25-500 MG Tabs tablet Commonly known as:  TYLENOL PM   metoprolol tartrate 50 MG tablet Commonly known as:  LOPRESSOR     TAKE these medications   albuterol 108 (90 Base) MCG/ACT inhaler Commonly known as:  PROVENTIL HFA;VENTOLIN HFA Inhale 2 puffs into the lungs every 6 (six) hours as needed for wheezing or shortness of breath.   atorvastatin 40 MG tablet Commonly known as:  LIPITOR Take 1 tablet (40 mg total) by mouth daily at 6 PM.   colchicine 0.6 MG tablet  Take 1 tablet (0.6 mg total) by mouth daily.   inFLIXimab 100 MG injection Commonly known as:  REMICADE Inject 100 mg into the vein.   leflunomide 20 MG tablet Commonly known as:  ARAVA Take 20 mg by mouth daily.   losartan 50 MG tablet Commonly known as:  COZAAR Take 50  mg by mouth daily.   sertraline 50 MG tablet Commonly known as:  ZOLOFT Take 50 mg by mouth at bedtime.   traMADol 50 MG tablet Commonly known as:  ULTRAM Take 50 mg by mouth every 6 (six) hours as needed.     ASK your doctor about these medications   azithromycin 250 MG tablet Commonly known as:  ZITHROMAX Z-PAK Take 2 tablets (500 mg) on  Day 1,  followed by 1 tablet (250 mg) once daily on Days 2 through 5. Ask about: Should I take this medication?         Diet and Activity recommendation: See Discharge Instructions above   Consults obtained -cardiology   Major procedures and Radiology Reports - PLEASE review detailed and final reports for all details, in brief -      Dg Chest 1 View  Result Date: 05/28/2017 CLINICAL DATA:  66 year old female with chest pain today. EXAM: CHEST 1 VIEW COMPARISON:  09/10/2012. FINDINGS: Portable AP upright view at 0854 hours. Stable cardiac size at the upper limits of normal. Other mediastinal contours are within normal limits. Visualized tracheal air column is within normal limits. Chronic large lung volumes. Mild increased interstitial markings in both lungs appear increased. Mild linear atelectasis or scarring in the left mid lung. No pneumothorax, pulmonary edema or pleural effusion. Thoracic scoliosis. Negative visible bowel gas pattern. IMPRESSION: 1. Increased interstitial markings in both lungs such that mild or developing interstitial edema is difficult to exclude. Alternatively these could be chronic, as there is evidence of hyperinflation. 2. Linear scarring or atelectasis suspected in the left mid lung. 3. Borderline to mild cardiomegaly. Electronically Signed   By: Genevie Ann M.D.   On: 05/28/2017 09:26    Micro Results    Recent Results (from the past 240 hour(s))  MRSA PCR Screening     Status: None   Collection Time: 05/28/17 10:41 AM  Result Value Ref Range Status   MRSA by PCR NEGATIVE NEGATIVE Final    Comment:         The GeneXpert MRSA Assay (FDA approved for NASAL specimens only), is one component of a comprehensive MRSA colonization surveillance program. It is not intended to diagnose MRSA infection nor to guide or monitor treatment for MRSA infections.        Today   Subjective:   Corena Tilson today has no headache,no chest abdominal pain,no new weakness tingling or numbness, feels much better wants to go home today.   Objective:   Blood pressure (!) 138/97, pulse 88, temperature 98.4 F (36.9 C), temperature source Oral, resp. rate 17, height 5\' 2"  (1.575 m), weight 60.9 kg (134 lb 4.8 oz), SpO2 95 %.  No intake or output data in the 24 hours ending 06/06/17 1440  Exam Awake Alert, Oriented x 3, No new F.N deficits, Normal affect [place].AT,PERRAL Supple Neck,No JVD, No cervical lymphadenopathy appriciated.  Symmetrical Chest wall movement, Good air movement bilaterally, CTAB RRR,No Gallops,Rubs or new Murmurs, No Parasternal Heave +ve B.Sounds, Abd Soft, Non tender, No organomegaly appriciated, No rebound -guarding or rigidity. No Cyanosis, Clubbing or edema, No new Rash or bruise  Data Review  CBC w Diff:  Lab Results  Component Value Date   WBC 8.7 05/29/2017   HGB 13.8 05/29/2017   HGB 13.8 09/10/2012   HCT 39.6 05/29/2017   HCT 40.9 09/10/2012   PLT 290 05/29/2017   PLT 198 09/10/2012    CMP:  Lab Results  Component Value Date   NA 138 05/29/2017   NA 137 09/10/2012   K 3.6 05/29/2017   K 3.7 09/10/2012   CL 106 05/29/2017   CL 104 09/10/2012   CO2 27 05/29/2017   CO2 25 09/10/2012   BUN 11 05/29/2017   BUN 6 (L) 09/10/2012   CREATININE 0.48 05/29/2017   CREATININE 0.64 09/10/2012  .   Total Time in preparing paper work, data evaluation and todays exam - 92 minutes  Martrice Apt M.D on 10/42018 at 2:40 PM    Note: This dictation was prepared with Dragon dictation along with smaller phrase technology. Any transcriptional errors that result  from this process are unintentional.

## 2019-10-16 ENCOUNTER — Ambulatory Visit: Payer: Medicare HMO | Attending: Internal Medicine

## 2019-10-16 DIAGNOSIS — Z23 Encounter for immunization: Secondary | ICD-10-CM | POA: Insufficient documentation

## 2019-10-16 NOTE — Progress Notes (Signed)
   Covid-19 Vaccination Clinic  Name:  Kathleen Blankenship    MRN: [id number] DOB: 06-May-1951  10/16/2019  Kathleen Blankenship was observed post Covid-19 immunization for 30 minutes based on pre-vaccination screening without incidence. She was provided with Vaccine Information Sheet and instruction to access the V-Safe system.   Kathleen Blankenship was instructed to call 911 with any severe reactions post vaccine: Marland Kitchen Difficulty breathing  . Swelling of your face and throat  . A fast heartbeat  . A bad rash all over your body  . Dizziness and weakness    Immunizations Administered    Name Date Dose VIS Date Route   Pfizer COVID-19 Vaccine 10/16/2019 12:34 PM 0.3 mL 08/06/2019 Intramuscular   Manufacturer: [place]   Lot: [id number]   [place]: [id number]

## 2019-10-25 DIAGNOSIS — Z6823 Body mass index (BMI) 23.0-23.9, adult: Secondary | ICD-10-CM | POA: Diagnosis not present

## 2019-10-25 DIAGNOSIS — Z72820 Sleep deprivation: Secondary | ICD-10-CM | POA: Diagnosis not present

## 2019-10-25 DIAGNOSIS — M2550 Pain in unspecified joint: Secondary | ICD-10-CM | POA: Diagnosis not present

## 2019-10-25 DIAGNOSIS — Z7189 Other specified counseling: Secondary | ICD-10-CM | POA: Diagnosis not present

## 2019-10-25 DIAGNOSIS — M0579 Rheumatoid arthritis with rheumatoid factor of multiple sites without organ or systems involvement: Secondary | ICD-10-CM | POA: Diagnosis not present

## 2019-10-25 DIAGNOSIS — Z79899 Other long term (current) drug therapy: Secondary | ICD-10-CM | POA: Diagnosis not present

## 2019-10-25 DIAGNOSIS — R5383 Other fatigue: Secondary | ICD-10-CM | POA: Diagnosis not present

## 2019-10-25 DIAGNOSIS — E559 Vitamin D deficiency, unspecified: Secondary | ICD-10-CM | POA: Diagnosis not present

## 2019-11-09 ENCOUNTER — Ambulatory Visit: Payer: Medicare HMO | Attending: Internal Medicine

## 2019-11-09 DIAGNOSIS — Z23 Encounter for immunization: Secondary | ICD-10-CM

## 2019-11-09 NOTE — Progress Notes (Signed)
   Covid-19 Vaccination Clinic  Name:  Kathleen Blankenship    MRN: [id number] DOB: [birth year]  11/09/2019  [name] was observed post Covid-19 immunization for 15 minutes without incident. She was provided with Vaccine Information Sheet and instruction to access the V-Safe system.   [name] was instructed to call 911 with any severe reactions post vaccine: Marland Kitchen Difficulty breathing  . Swelling of face and throat  . A fast heartbeat  . A bad rash all over body  . Dizziness and weakness   Immunizations Administered    Name Date Dose VIS Date Route   Pfizer COVID-19 Vaccine 11/09/2019 11:55 AM 0.3 mL 08/06/2019 Intramuscular   Manufacturer: [place]   Lot: [id number]   [place]: [id number]

## 2019-12-03 DIAGNOSIS — M05741 Rheumatoid arthritis with rheumatoid factor of right hand without organ or systems involvement: Secondary | ICD-10-CM | POA: Diagnosis not present

## 2019-12-03 DIAGNOSIS — R7989 Other specified abnormal findings of blood chemistry: Secondary | ICD-10-CM | POA: Diagnosis not present

## 2019-12-03 DIAGNOSIS — I10 Essential (primary) hypertension: Secondary | ICD-10-CM | POA: Diagnosis not present

## 2019-12-03 DIAGNOSIS — Z0000 Encounter for general adult medical examination without abnormal findings: Secondary | ICD-10-CM | POA: Diagnosis not present

## 2019-12-03 DIAGNOSIS — E039 Hypothyroidism, unspecified: Secondary | ICD-10-CM | POA: Diagnosis not present

## 2019-12-10 DIAGNOSIS — M069 Rheumatoid arthritis, unspecified: Secondary | ICD-10-CM | POA: Diagnosis not present

## 2019-12-10 DIAGNOSIS — I10 Essential (primary) hypertension: Secondary | ICD-10-CM | POA: Diagnosis not present

## 2019-12-10 DIAGNOSIS — Z0000 Encounter for general adult medical examination without abnormal findings: Secondary | ICD-10-CM | POA: Diagnosis not present

## 2019-12-10 DIAGNOSIS — E538 Deficiency of other specified B group vitamins: Secondary | ICD-10-CM | POA: Diagnosis not present

## 2019-12-10 DIAGNOSIS — M25441 Effusion, right hand: Secondary | ICD-10-CM | POA: Diagnosis not present

## 2019-12-10 DIAGNOSIS — M1990 Unspecified osteoarthritis, unspecified site: Secondary | ICD-10-CM | POA: Diagnosis not present

## 2019-12-10 DIAGNOSIS — E559 Vitamin D deficiency, unspecified: Secondary | ICD-10-CM | POA: Diagnosis not present

## 2019-12-10 DIAGNOSIS — R69 Illness, unspecified: Secondary | ICD-10-CM | POA: Diagnosis not present

## 2019-12-10 DIAGNOSIS — E890 Postprocedural hypothyroidism: Secondary | ICD-10-CM | POA: Diagnosis not present

## 2019-12-10 DIAGNOSIS — E785 Hyperlipidemia, unspecified: Secondary | ICD-10-CM | POA: Diagnosis not present

## 2019-12-16 ENCOUNTER — Telehealth: Payer: Self-pay | Admitting: *Deleted

## 2019-12-16 DIAGNOSIS — Z87891 Personal history of nicotine dependence: Secondary | ICD-10-CM

## 2019-12-16 NOTE — Telephone Encounter (Signed)
Received referral for initial lung cancer screening scan. Contacted patient and obtained smoking history,(current, ) as well as answering questions related to screening process. Patient denies signs of lung cancer such as weight loss or hemoptysis. Patient denies comorbidity that would prevent curative treatment if lung cancer were found. Patient is scheduled for shared decision making visit and CT scan on 01/11/20 at 115pm.

## 2020-01-11 ENCOUNTER — Other Ambulatory Visit: Payer: Self-pay

## 2020-01-11 ENCOUNTER — Encounter: Payer: Self-pay | Admitting: Oncology

## 2020-01-11 ENCOUNTER — Ambulatory Visit
Admission: RE | Admit: 2020-01-11 | Discharge: 2020-01-11 | Disposition: A | Discharge status: Home / Self Care | Payer: Medicare HMO | Source: Ambulatory Visit | Attending: Oncology | Admitting: Oncology

## 2020-01-11 ENCOUNTER — Inpatient Hospital Stay: Payer: Medicare HMO | Attending: Oncology | Admitting: Hospice and Palliative Medicine

## 2020-01-11 DIAGNOSIS — Z87891 Personal history of nicotine dependence: Secondary | ICD-10-CM

## 2020-01-11 IMAGING — CT CT CHEST LUNG CANCER SCREENING LOW DOSE W/O CM
2 of 5 series · 14 of 40 positions shown, 17 images · non-contrast
Comparison: None.
COMPARISON: None.

Addendum:
CLINICAL DATA: 68-year-old female with 37 pack-year history of
smoking. Lung cancer screening.

EXAM:
CT CHEST WITHOUT CONTRAST LOW-DOSE FOR LUNG CANCER SCREENING
TECHNIQUE: Multidetector CT imaging of the chest was performed following the
standard protocol without IV contrast.

[Series 3: lung 1.00 · axial · 0.55mm/px · z∈[-1192,-892]mm · 11 of 331 slices shown, 14 images]
[im 16/331  mediastinal]
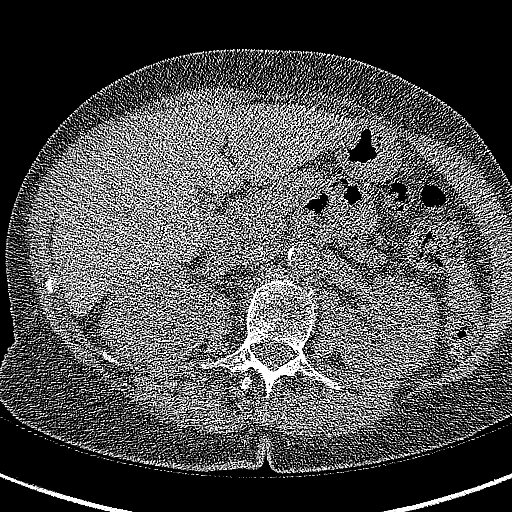
[im 16/331  lung]
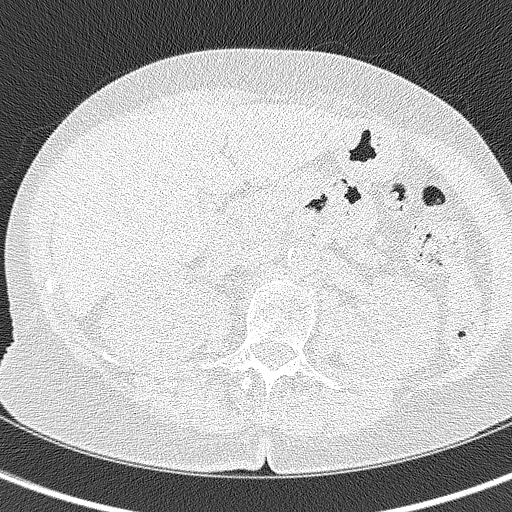
[im 46/331  lung]
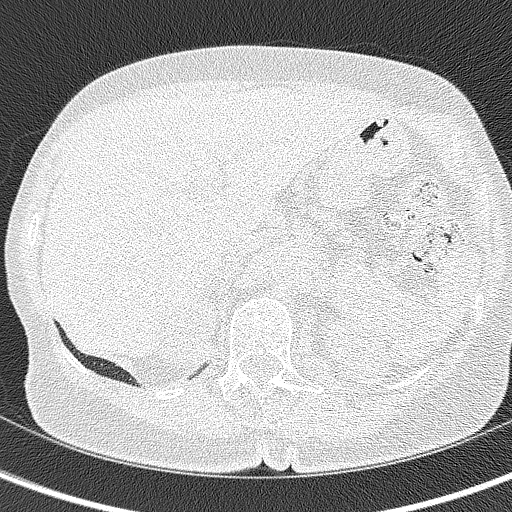
[im 76/331  lung]
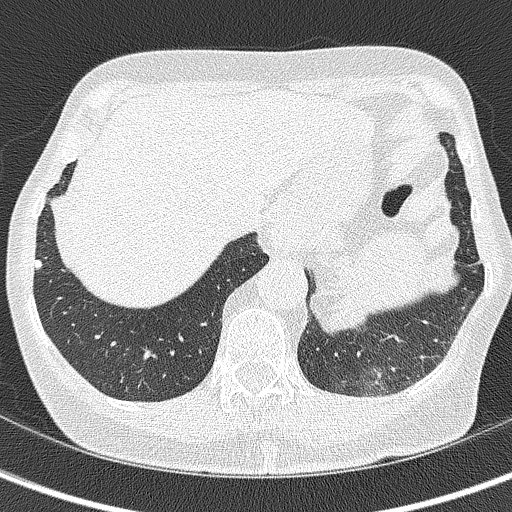
[im 106/331  lung]
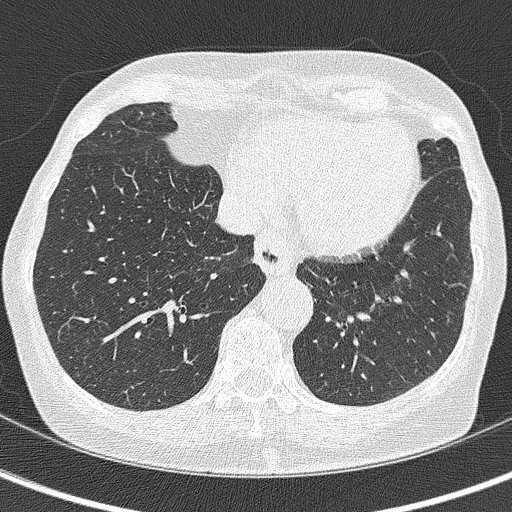
[im 136/331  mediastinal]
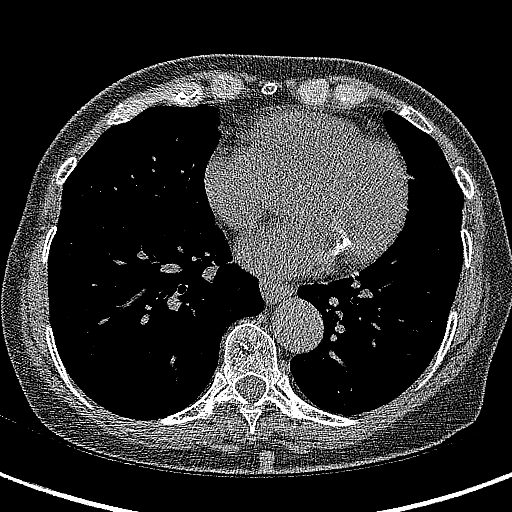
[im 136/331  lung]
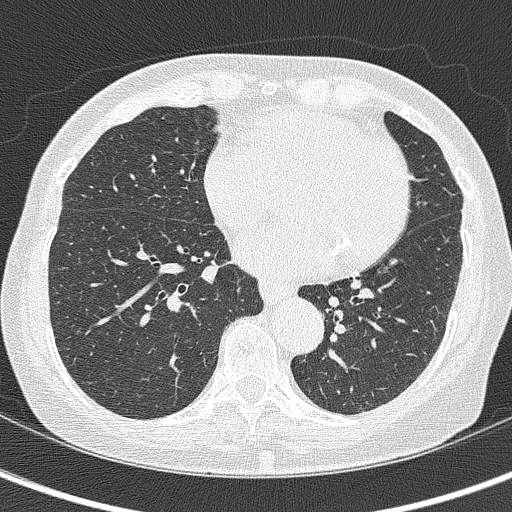
[im 166/331  lung]
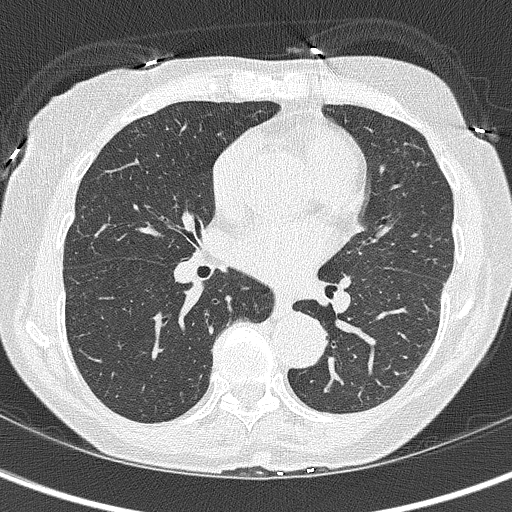
[im 196/331  lung]
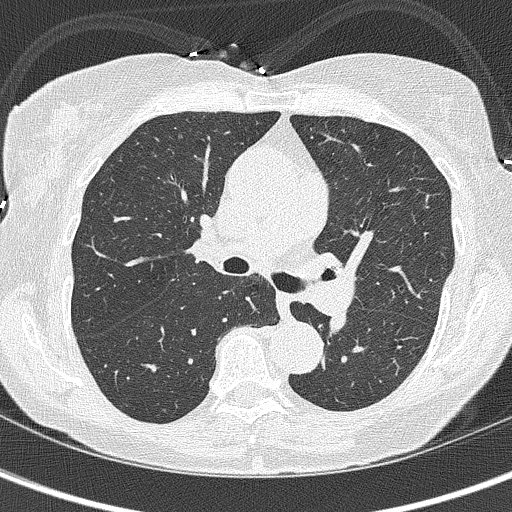
[im 226/331  lung]
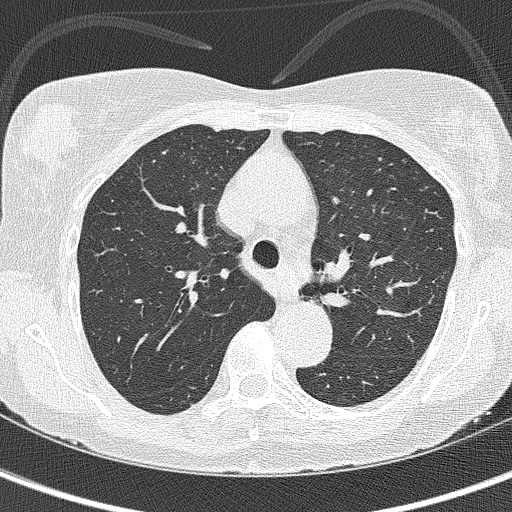
[im 256/331  mediastinal]
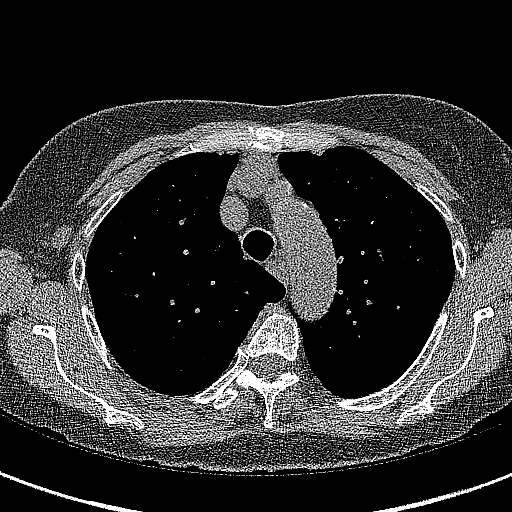
[im 256/331  lung]
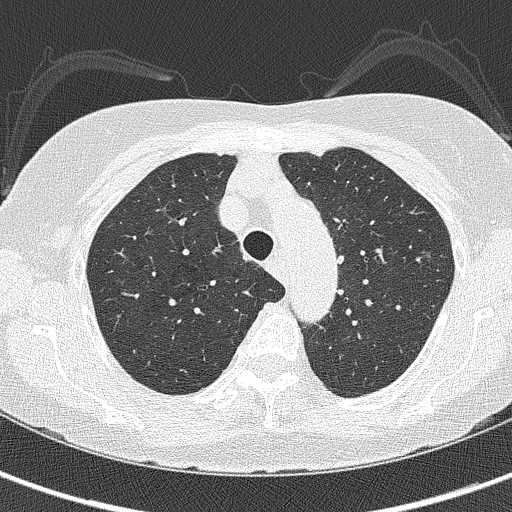
[im 286/331  lung]
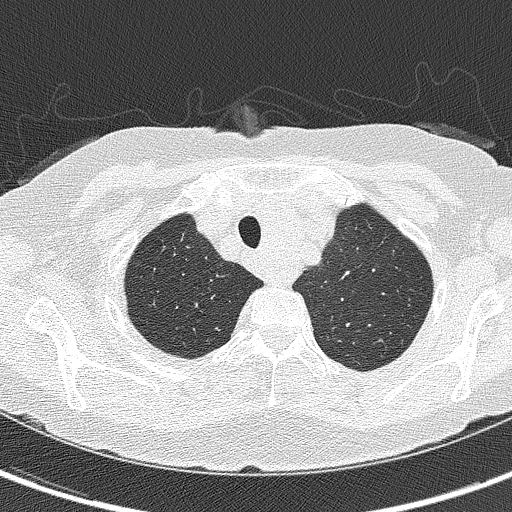
[im 316/331  lung]
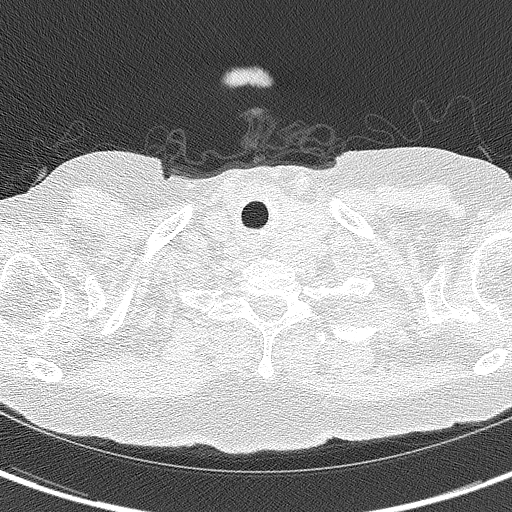

[Series 4: coronals lung 1.00 cor · coronal · 0.55mm/px · 3 of 236 slices shown]
[im 48/236  lung]
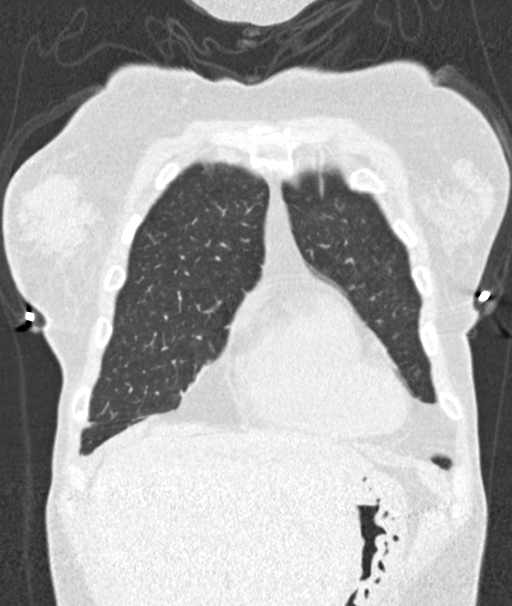
[im 95/236  lung]
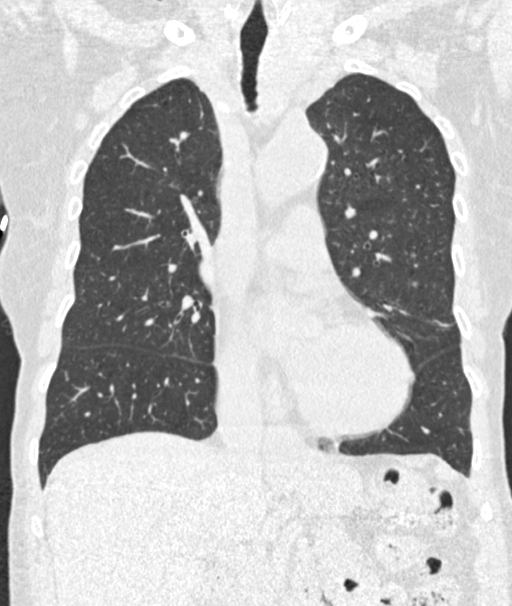
[im 142/236  lung]
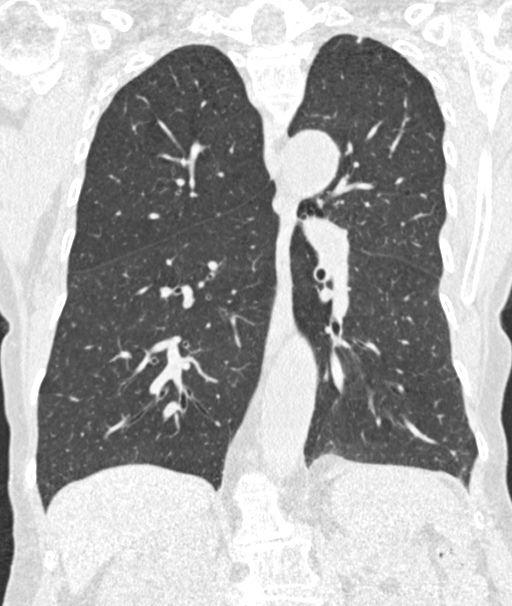

[14 of 40 positions shown; findings below may reference images not displayed]

FINDINGS: Cardiovascular: The heart size is normal. No substantial pericardial
effusion. Coronary artery calcification is evident. Atherosclerotic
calcification is noted in the wall of the thoracic aorta.

Mediastinum/Nodes: Asymmetric enlargement left thyroid lobe with 15
mm heterogeneous nodule identified on image 6/series 2. No
mediastinal lymphadenopathy. No evidence for gross hilar
lymphadenopathy although assessment is limited by the lack of
intravenous contrast on today's study. The esophagus has normal
imaging features. Tiny hiatal hernia associated. There is no
axillary lymphadenopathy. 9 mm short axis lymph node evident in the
right axilla on image [DATE], upper normal for size.

Lungs/Pleura: Centrilobular emphsyema noted. Tiny bilateral
pulmonary nodules identified measuring up to maximum volume derived
equivalent diameter of 4.1 mm. No focal airspace consolidation. No
pleural effusion.

Upper Abdomen: Unremarkable

Musculoskeletal: No worrisome lytic or sclerotic osseous
abnormality.
IMPRESSION: 1. Lung-RADS 2, benign appearance or behavior. Continue annual
screening with low-dose chest CT without contrast in 12 months.
2. Aortic Atherosclerosis ([4B]-[4B]) and Emphysema ([4B]-[4B]).

ADDENDUM:
As noted in the body of the report, there is a 15 mm heterogeneous
nodule in the left thyroid lobe. Thyroid ultrasound recommended.
(Ref: [HOSPITAL]. [DATE]): 143-50).

*** End of Addendum ***
FINDINGS: Cardiovascular: The heart size is normal. No substantial pericardial
effusion. Coronary artery calcification is evident. Atherosclerotic
calcification is noted in the wall of the thoracic aorta.

Mediastinum/Nodes: Asymmetric enlargement left thyroid lobe with 15
mm heterogeneous nodule identified on image 6/series 2. No
mediastinal lymphadenopathy. No evidence for gross hilar
lymphadenopathy although assessment is limited by the lack of
intravenous contrast on today's study. The esophagus has normal
imaging features. Tiny hiatal hernia associated. There is no
axillary lymphadenopathy. 9 mm short axis lymph node evident in the
right axilla on image [DATE], upper normal for size.

Lungs/Pleura: Centrilobular emphsyema noted. Tiny bilateral
pulmonary nodules identified measuring up to maximum volume derived
equivalent diameter of 4.1 mm. No focal airspace consolidation. No
pleural effusion.

Upper Abdomen: Unremarkable

Musculoskeletal: No worrisome lytic or sclerotic osseous
abnormality.
IMPRESSION: 1. Lung-RADS 2, benign appearance or behavior. Continue annual
screening with low-dose chest CT without contrast in 12 months.
2. Aortic Atherosclerosis ([4B]-[4B]) and Emphysema ([4B]-[4B]).

## 2020-01-11 NOTE — Progress Notes (Signed)
In accordance with CMS guidelines, patient has met eligibility criteria including age, absence of signs or symptoms of lung cancer.  Social History   Tobacco Use  . Smoking status: Current Every Day Smoker    Packs/day: 1.00    Years: 37.00    Pack years: 37.00    Types: Cigarettes  . Smokeless tobacco: Never Used  Substance Use Topics  . Alcohol use: No  . Drug use: No      A shared decision-making session was conducted prior to the performance of CT scan. This includes one or more decision aids, includes benefits and harms of screening, follow-up diagnostic testing, over-diagnosis, false positive rate, and total radiation exposure.   Counseling on the importance of adherence to annual lung cancer LDCT screening, impact of co-morbidities, and ability or willingness to undergo diagnosis and treatment is imperative for compliance of the program.   Counseling on the importance of continued smoking cessation for former smokers; the importance of smoking cessation for current smokers, and information about tobacco cessation interventions have been given to patient including [facility] and [facility] quit [place] programs.   Written order for lung cancer screening with LDCT has been given to the patient and any and all questions have been answered to the best of my abilities.    Yearly follow up will be coordinated by Burgess Estelle, Thoracic Navigator.  Time Total: 15 minutes  Visit consisted of counseling and education dealing with complex health screening. Greater than 50%  of this time was spent counseling and coordinating care related to the above assessment and plan.  Signed by: Altha Harm, PhD, NP-C [phone] (Work Cell)

## 2020-01-14 ENCOUNTER — Encounter: Payer: Self-pay | Admitting: *Deleted

## 2020-01-14 ENCOUNTER — Telehealth: Payer: Self-pay | Admitting: *Deleted

## 2020-01-14 NOTE — Telephone Encounter (Signed)
Notified Dr. Linton Ham office verbally of thyroid nodule seen on recent lung screening scan. Verbalized understanding.

## 2020-01-25 DIAGNOSIS — R5383 Other fatigue: Secondary | ICD-10-CM | POA: Diagnosis not present

## 2020-01-25 DIAGNOSIS — M2550 Pain in unspecified joint: Secondary | ICD-10-CM | POA: Diagnosis not present

## 2020-01-25 DIAGNOSIS — E559 Vitamin D deficiency, unspecified: Secondary | ICD-10-CM | POA: Diagnosis not present

## 2020-01-25 DIAGNOSIS — Z6823 Body mass index (BMI) 23.0-23.9, adult: Secondary | ICD-10-CM | POA: Diagnosis not present

## 2020-01-25 DIAGNOSIS — M0579 Rheumatoid arthritis with rheumatoid factor of multiple sites without organ or systems involvement: Secondary | ICD-10-CM | POA: Diagnosis not present

## 2020-01-25 DIAGNOSIS — Z72820 Sleep deprivation: Secondary | ICD-10-CM | POA: Diagnosis not present

## 2020-01-25 DIAGNOSIS — Z79899 Other long term (current) drug therapy: Secondary | ICD-10-CM | POA: Diagnosis not present

## 2020-01-25 DIAGNOSIS — Z7189 Other specified counseling: Secondary | ICD-10-CM | POA: Diagnosis not present

## 2020-02-09 DIAGNOSIS — E041 Nontoxic single thyroid nodule: Secondary | ICD-10-CM | POA: Diagnosis not present

## 2020-02-11 DIAGNOSIS — E041 Nontoxic single thyroid nodule: Secondary | ICD-10-CM | POA: Diagnosis not present

## 2020-04-26 DIAGNOSIS — M0579 Rheumatoid arthritis with rheumatoid factor of multiple sites without organ or systems involvement: Secondary | ICD-10-CM | POA: Diagnosis not present

## 2020-04-26 DIAGNOSIS — Z13220 Encounter for screening for lipoid disorders: Secondary | ICD-10-CM | POA: Diagnosis not present

## 2020-04-26 DIAGNOSIS — Z6823 Body mass index (BMI) 23.0-23.9, adult: Secondary | ICD-10-CM | POA: Diagnosis not present

## 2020-04-26 DIAGNOSIS — Z79899 Other long term (current) drug therapy: Secondary | ICD-10-CM | POA: Diagnosis not present

## 2020-04-26 DIAGNOSIS — M2550 Pain in unspecified joint: Secondary | ICD-10-CM | POA: Diagnosis not present

## 2020-06-06 DIAGNOSIS — M05741 Rheumatoid arthritis with rheumatoid factor of right hand without organ or systems involvement: Secondary | ICD-10-CM | POA: Diagnosis not present

## 2020-06-06 DIAGNOSIS — I10 Essential (primary) hypertension: Secondary | ICD-10-CM | POA: Diagnosis not present

## 2020-06-06 DIAGNOSIS — R69 Illness, unspecified: Secondary | ICD-10-CM | POA: Diagnosis not present

## 2020-06-06 DIAGNOSIS — Z720 Tobacco use: Secondary | ICD-10-CM | POA: Diagnosis not present

## 2020-06-06 DIAGNOSIS — R7989 Other specified abnormal findings of blood chemistry: Secondary | ICD-10-CM | POA: Diagnosis not present

## 2020-06-13 DIAGNOSIS — E039 Hypothyroidism, unspecified: Secondary | ICD-10-CM | POA: Diagnosis not present

## 2020-06-13 DIAGNOSIS — R69 Illness, unspecified: Secondary | ICD-10-CM | POA: Diagnosis not present

## 2020-06-13 DIAGNOSIS — R7989 Other specified abnormal findings of blood chemistry: Secondary | ICD-10-CM | POA: Diagnosis not present

## 2020-06-13 DIAGNOSIS — Z0000 Encounter for general adult medical examination without abnormal findings: Secondary | ICD-10-CM | POA: Diagnosis not present

## 2020-06-13 DIAGNOSIS — E538 Deficiency of other specified B group vitamins: Secondary | ICD-10-CM | POA: Diagnosis not present

## 2020-06-13 DIAGNOSIS — Z23 Encounter for immunization: Secondary | ICD-10-CM | POA: Diagnosis not present

## 2020-06-13 DIAGNOSIS — M069 Rheumatoid arthritis, unspecified: Secondary | ICD-10-CM | POA: Diagnosis not present

## 2020-06-13 DIAGNOSIS — I10 Essential (primary) hypertension: Secondary | ICD-10-CM | POA: Diagnosis not present

## 2020-06-29 DIAGNOSIS — Z20822 Contact with and (suspected) exposure to covid-19: Secondary | ICD-10-CM | POA: Diagnosis not present

## 2020-07-25 ENCOUNTER — Emergency Department
Admission: EM | Admit: 2020-07-25 | Discharge: 2020-07-26 | Disposition: A | Discharge status: Other Acute Inpatient Hospital | Payer: Medicare HMO | Attending: Emergency Medicine | Admitting: Emergency Medicine

## 2020-07-25 ENCOUNTER — Other Ambulatory Visit: Payer: Self-pay

## 2020-07-25 ENCOUNTER — Emergency Department: Payer: Medicare HMO

## 2020-07-25 DIAGNOSIS — S0993XA Unspecified injury of face, initial encounter: Secondary | ICD-10-CM | POA: Diagnosis not present

## 2020-07-25 DIAGNOSIS — S42212A Unspecified displaced fracture of surgical neck of left humerus, initial encounter for closed fracture: Secondary | ICD-10-CM | POA: Diagnosis not present

## 2020-07-25 DIAGNOSIS — S4292XA Fracture of left shoulder girdle, part unspecified, initial encounter for closed fracture: Secondary | ICD-10-CM | POA: Insufficient documentation

## 2020-07-25 DIAGNOSIS — I10 Essential (primary) hypertension: Secondary | ICD-10-CM | POA: Insufficient documentation

## 2020-07-25 DIAGNOSIS — S42202A Unspecified fracture of upper end of left humerus, initial encounter for closed fracture: Secondary | ICD-10-CM | POA: Diagnosis not present

## 2020-07-25 DIAGNOSIS — S4992XA Unspecified injury of left shoulder and upper arm, initial encounter: Secondary | ICD-10-CM | POA: Diagnosis present

## 2020-07-25 DIAGNOSIS — S0990XA Unspecified injury of head, initial encounter: Secondary | ICD-10-CM | POA: Diagnosis not present

## 2020-07-25 DIAGNOSIS — W010XXA Fall on same level from slipping, tripping and stumbling without subsequent striking against object, initial encounter: Secondary | ICD-10-CM | POA: Insufficient documentation

## 2020-07-25 DIAGNOSIS — S52351A Displaced comminuted fracture of shaft of radius, right arm, initial encounter for closed fracture: Secondary | ICD-10-CM | POA: Insufficient documentation

## 2020-07-25 DIAGNOSIS — Z20822 Contact with and (suspected) exposure to covid-19: Secondary | ICD-10-CM | POA: Insufficient documentation

## 2020-07-25 DIAGNOSIS — F17210 Nicotine dependence, cigarettes, uncomplicated: Secondary | ICD-10-CM | POA: Diagnosis not present

## 2020-07-25 DIAGNOSIS — S52181A Other fracture of upper end of right radius, initial encounter for closed fracture: Secondary | ICD-10-CM | POA: Diagnosis not present

## 2020-07-25 DIAGNOSIS — S53104A Unspecified dislocation of right ulnohumeral joint, initial encounter: Secondary | ICD-10-CM | POA: Diagnosis not present

## 2020-07-25 DIAGNOSIS — S52091A Other fracture of upper end of right ulna, initial encounter for closed fracture: Secondary | ICD-10-CM | POA: Diagnosis not present

## 2020-07-25 DIAGNOSIS — S0031XA Abrasion of nose, initial encounter: Secondary | ICD-10-CM | POA: Diagnosis not present

## 2020-07-25 DIAGNOSIS — S42292A Other displaced fracture of upper end of left humerus, initial encounter for closed fracture: Secondary | ICD-10-CM | POA: Diagnosis not present

## 2020-07-25 DIAGNOSIS — Z79899 Other long term (current) drug therapy: Secondary | ICD-10-CM | POA: Insufficient documentation

## 2020-07-25 DIAGNOSIS — S52121A Displaced fracture of head of right radius, initial encounter for closed fracture: Secondary | ICD-10-CM | POA: Diagnosis not present

## 2020-07-25 DIAGNOSIS — S52101A Unspecified fracture of upper end of right radius, initial encounter for closed fracture: Secondary | ICD-10-CM | POA: Diagnosis not present

## 2020-07-25 DIAGNOSIS — R69 Illness, unspecified: Secondary | ICD-10-CM | POA: Diagnosis not present

## 2020-07-25 DIAGNOSIS — S52001A Unspecified fracture of upper end of right ulna, initial encounter for closed fracture: Secondary | ICD-10-CM | POA: Diagnosis not present

## 2020-07-25 DIAGNOSIS — S42401A Unspecified fracture of lower end of right humerus, initial encounter for closed fracture: Secondary | ICD-10-CM

## 2020-07-25 DIAGNOSIS — Z01818 Encounter for other preprocedural examination: Secondary | ICD-10-CM

## 2020-07-25 LAB — CBC WITH DIFFERENTIAL/PLATELET
Abs Immature Granulocytes: 0.05 10*3/uL (ref 0.00–0.07)
Basophils Absolute: 0 10*3/uL (ref 0.0–0.1)
Basophils Relative: 0 %
Eosinophils Absolute: 0.1 10*3/uL (ref 0.0–0.5)
Eosinophils Relative: 1 %
HCT: 40.6 % (ref 36.0–46.0)
Hemoglobin: 13.7 g/dL (ref 12.0–15.0)
Immature Granulocytes: 0 %
Lymphocytes Relative: 8 %
Lymphs Abs: 1 10*3/uL (ref 0.7–4.0)
MCH: 30.9 pg (ref 26.0–34.0)
MCHC: 33.7 g/dL (ref 30.0–36.0)
MCV: 91.6 fL (ref 80.0–100.0)
Monocytes Absolute: 0.9 10*3/uL (ref 0.1–1.0)
Monocytes Relative: 7 %
Neutro Abs: 10.8 10*3/uL — ABNORMAL HIGH (ref 1.7–7.7)
Neutrophils Relative %: 84 %
Platelets: 245 10*3/uL (ref 150–400)
RBC: 4.43 MIL/uL (ref 3.87–5.11)
RDW: 15.9 % — ABNORMAL HIGH (ref 11.5–15.5)
WBC: 12.9 10*3/uL — ABNORMAL HIGH (ref 4.0–10.5)
nRBC: 0 % (ref 0.0–0.2)

## 2020-07-25 LAB — BASIC METABOLIC PANEL
Anion gap: 11 (ref 5–15)
BUN: 9 mg/dL (ref 8–23)
CO2: 21 mmol/L — ABNORMAL LOW (ref 22–32)
Calcium: 8.9 mg/dL (ref 8.9–10.3)
Chloride: 105 mmol/L (ref 98–111)
Creatinine, Ser: 0.63 mg/dL (ref 0.44–1.00)
GFR, Estimated: >60 mL/min (ref >60)
Glucose, Bld: 101 mg/dL — ABNORMAL HIGH (ref 70–99)
Potassium: 5 mmol/L (ref 3.5–5.1)
Sodium: 137 mmol/L (ref 135–145)

## 2020-07-25 LAB — RESP PANEL BY RT-PCR (FLU A&B, COVID) ARPGX2
Influenza A by PCR: NEGATIVE
Influenza B by PCR: NEGATIVE
SARS Coronavirus 2 by RT PCR: NEGATIVE

## 2020-07-25 IMAGING — CT CT MAXILLOFACIAL W/O CM
3 series · 16 of 47 positions shown, 19 images · non-contrast
Comparison: None.

CLINICAL DATA: Facial trauma after fall.

EXAM:
CT HEAD WITHOUT CONTRAST
CT MAXILLOFACIAL WITHOUT CONTRAST
TECHNIQUE: Multidetector CT imaging of the head and maxillofacial structures
were performed using the standard protocol without intravenous
contrast. Multiplanar CT image reconstructions of the maxillofacial
structures were also generated.

[Series 2: max soft · axial · 0.30mm/px · z∈[-209,-63]mm · 10 of 85 slices shown, 13 images]
[im 6/85  brain]
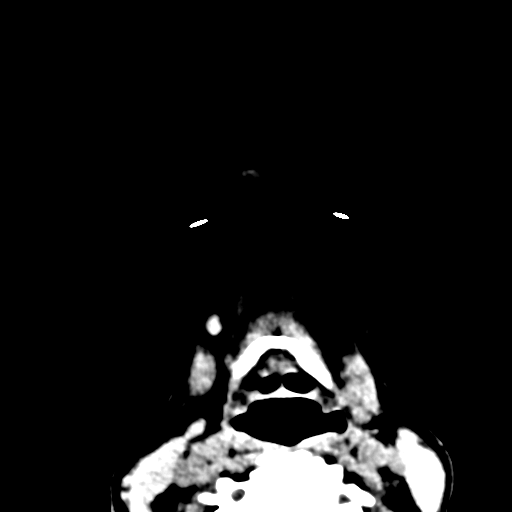
[im 6/85  bone]
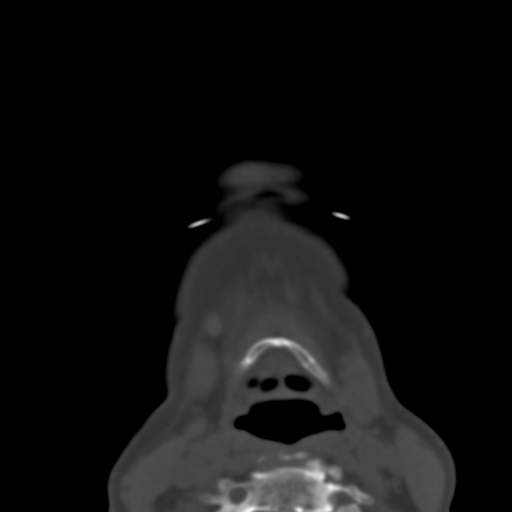
[im 15/85  bone]
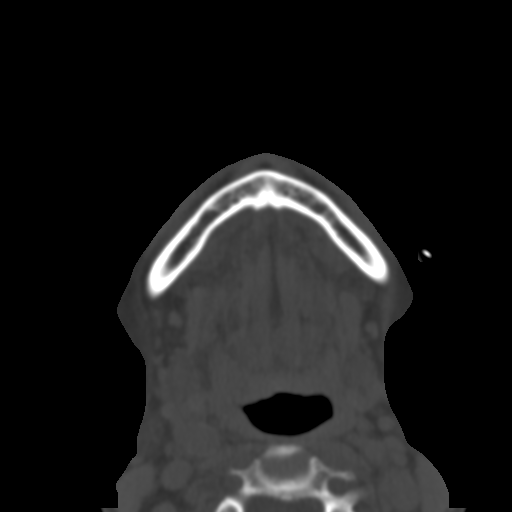
[im 24/85  bone]
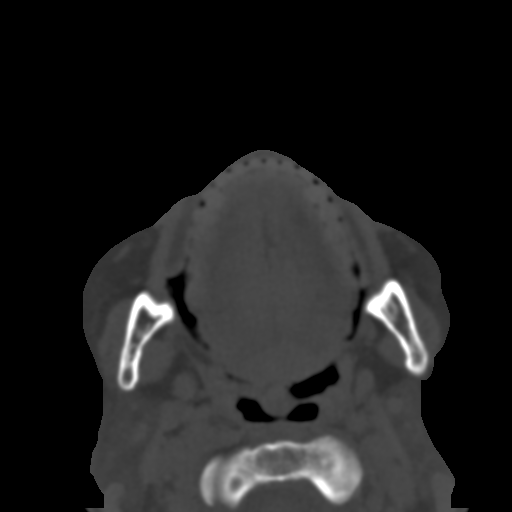
[im 29/85  bone]
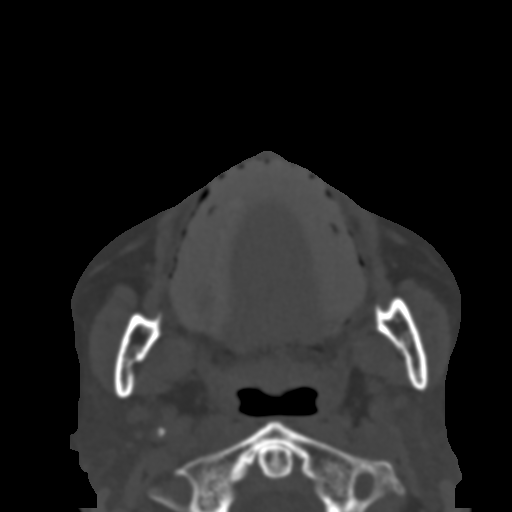
[im 38/85  brain]
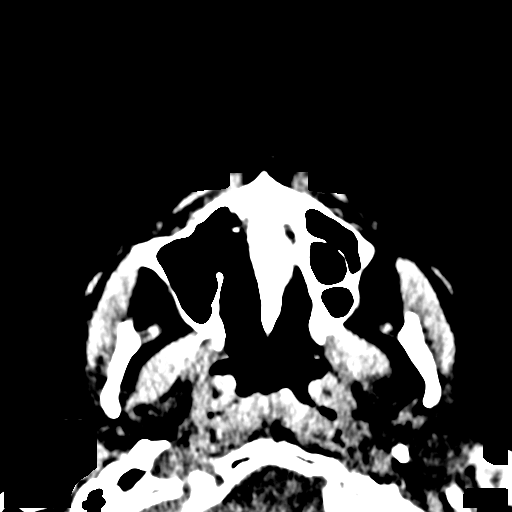
[im 38/85  bone]
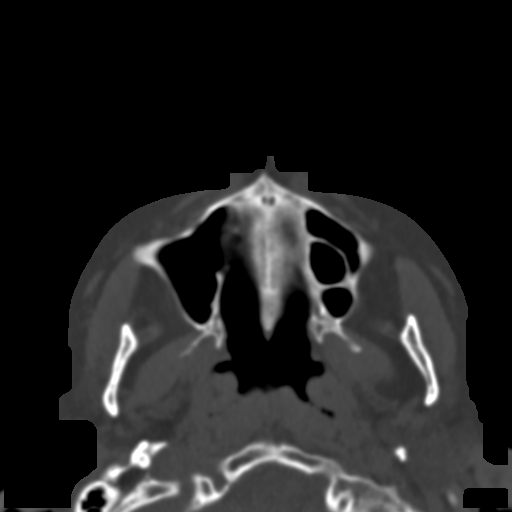
[im 47/85  bone]
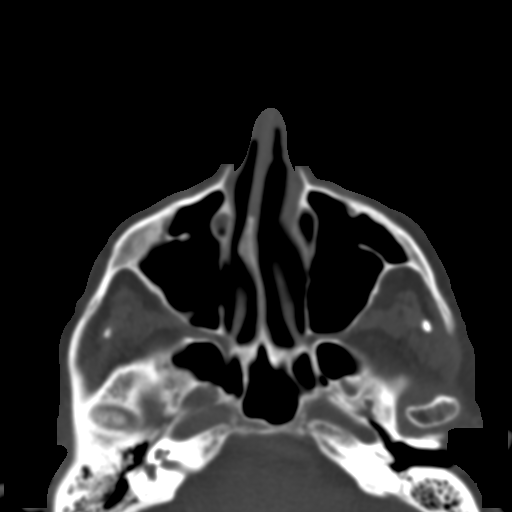
[im 56/85  bone]
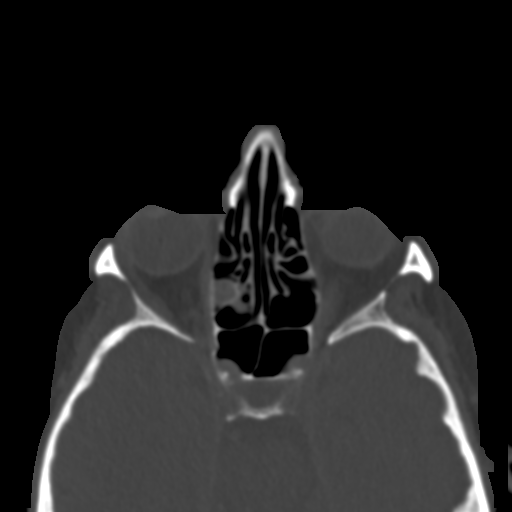
[im 64/85  bone]
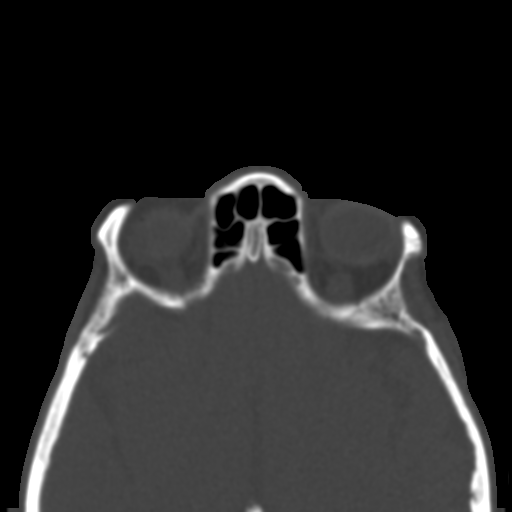
[im 70/85  brain]
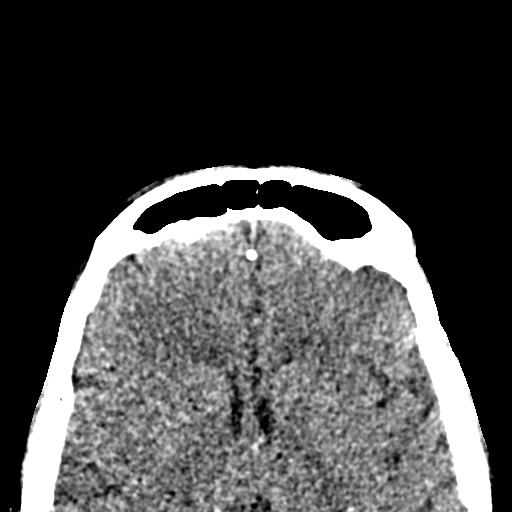
[im 70/85  bone]
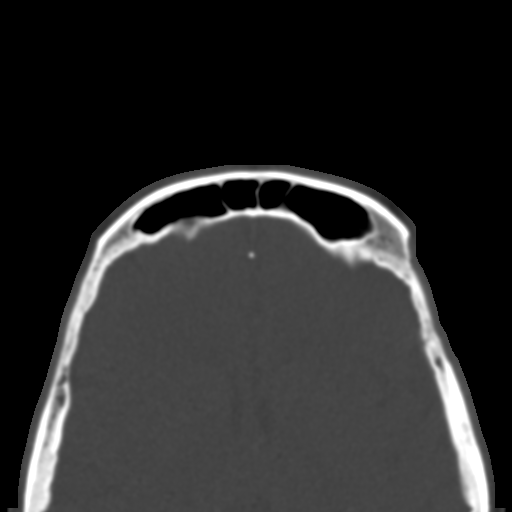
[im 79/85  bone]
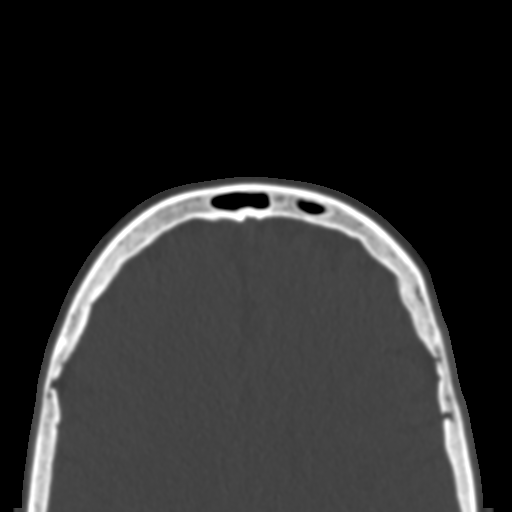

[Series 6: coronal soft · coronal · 0.35mm/px · 3 of 77 slices shown]
[im 26/77  bone]
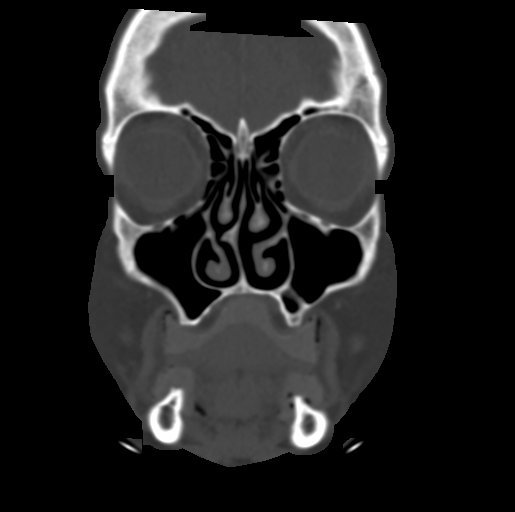
[im 34/77  bone]
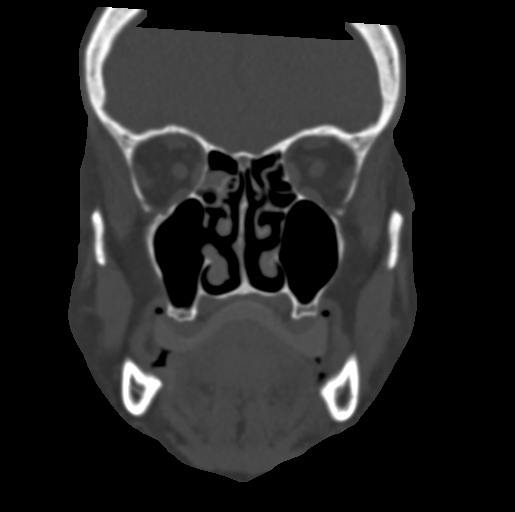
[im 43/77  bone]
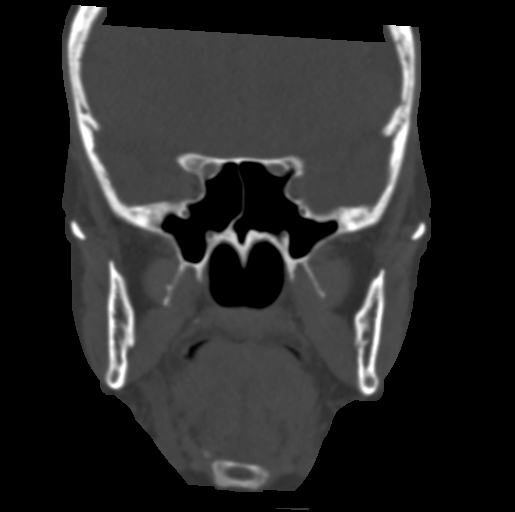

[Series 7: sagittal soft · sagittal · 0.35mm/px · 3 of 74 slices shown]
[im 25/74  bone]
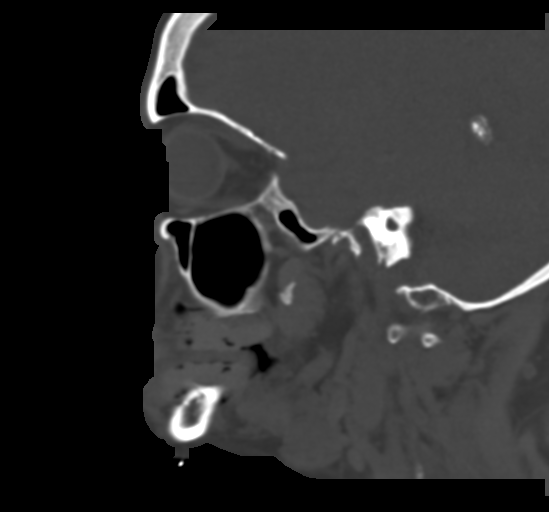
[im 37/74  bone]
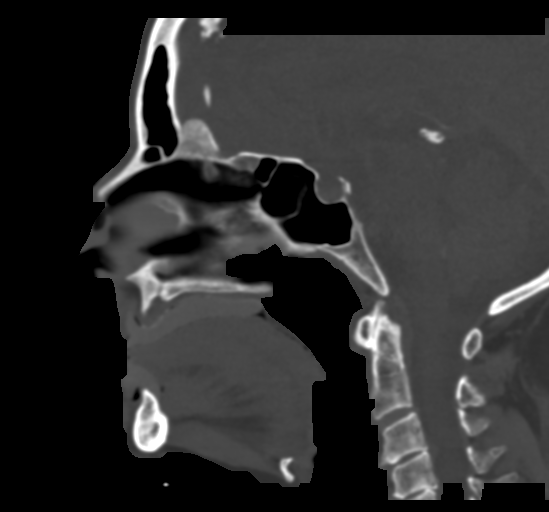
[im 49/74  bone]
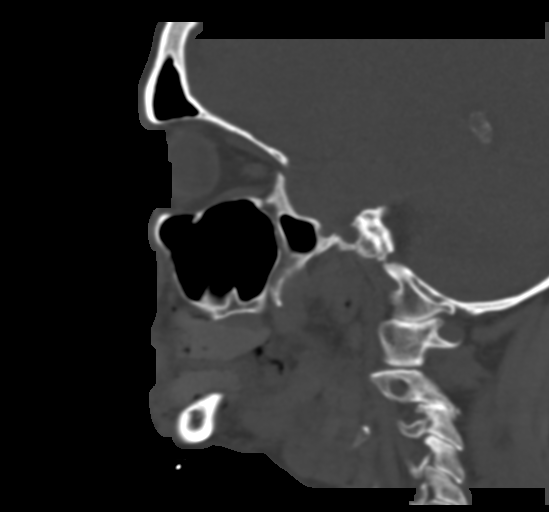

[16 of 47 positions shown; findings below may reference images not displayed]

FINDINGS: CT HEAD FINDINGS

Brain: No evidence of acute infarction, hemorrhage, hydrocephalus,
extra-axial collection or mass lesion/mass effect.

Vascular: No hyperdense vessel or unexpected calcification.

Skull: Normal. Negative for fracture or focal lesion.

Other: None.

CT MAXILLOFACIAL FINDINGS

Osseous: No fracture or mandibular dislocation. No destructive
process.

Orbits: Negative. No traumatic or inflammatory finding.

Sinuses: Clear.

Soft tissues: Negative.
IMPRESSION: 1. Normal head CT.
2. No abnormality seen in maxillofacial region.

## 2020-07-25 IMAGING — CT CT HEAD W/O CM
3 series · 15 of 47 positions shown, 18 images · non-contrast
Comparison: None.

CLINICAL DATA: Facial trauma after fall.

EXAM:
CT HEAD WITHOUT CONTRAST
CT MAXILLOFACIAL WITHOUT CONTRAST
TECHNIQUE: Multidetector CT imaging of the head and maxillofacial structures
were performed using the standard protocol without intravenous
contrast. Multiplanar CT image reconstructions of the maxillofacial
structures were also generated.

[Series 2: head wo · axial · 0.45mm/px · z∈[-125,+5]mm · 9 of 32 slices shown, 12 images]
[im 3/32  brain]
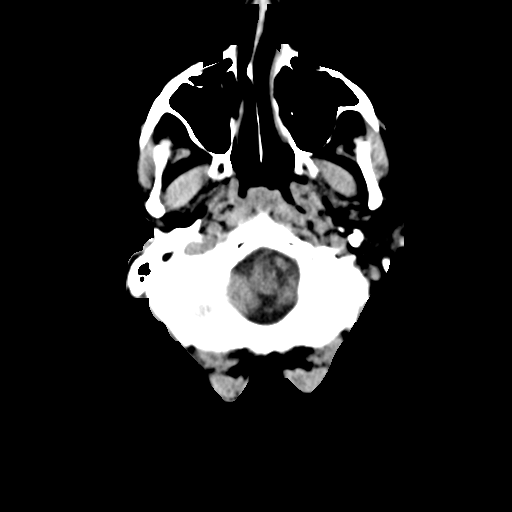
[im 3/32  bone]
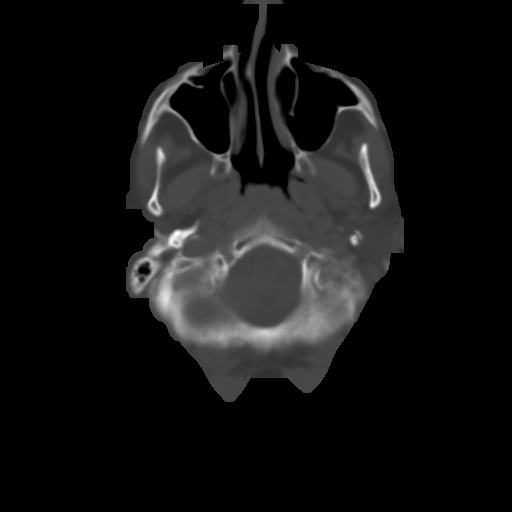
[im 6/32  brain]
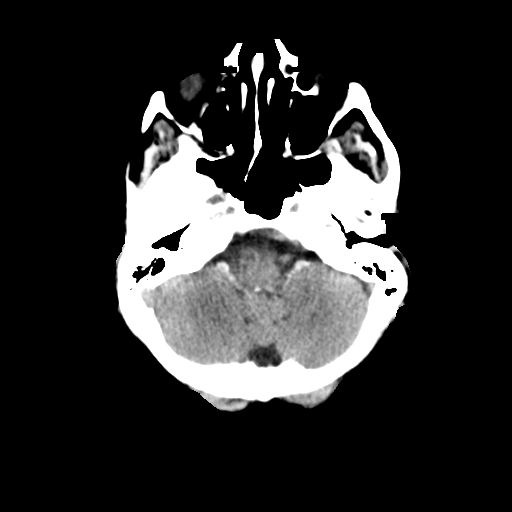
[im 9/32  brain]
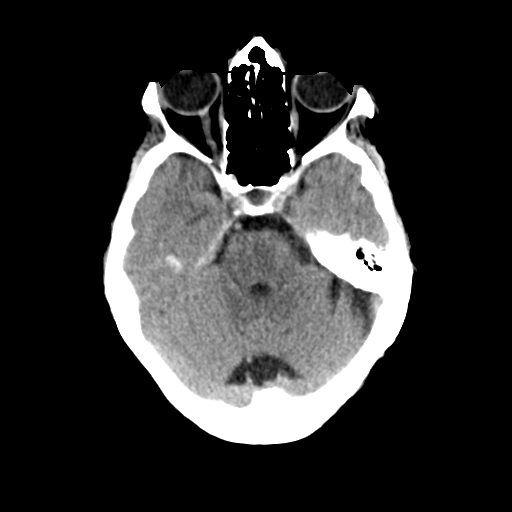
[im 12/32  brain]
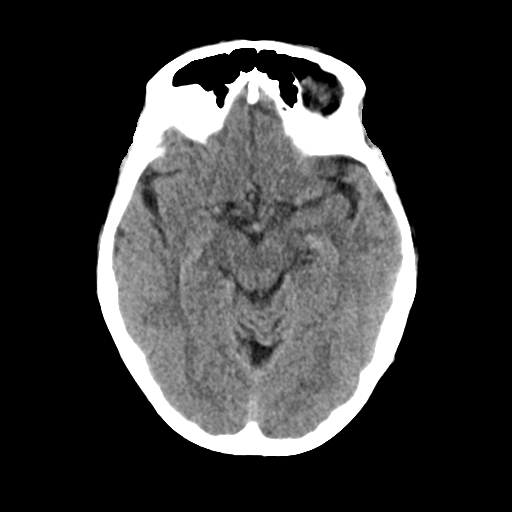
[im 17/32  brain]
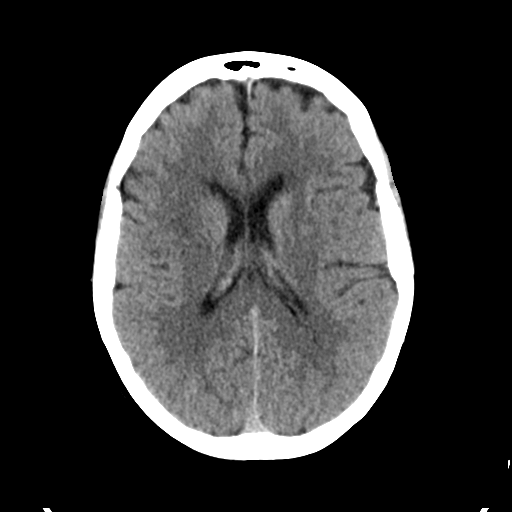
[im 17/32  bone]
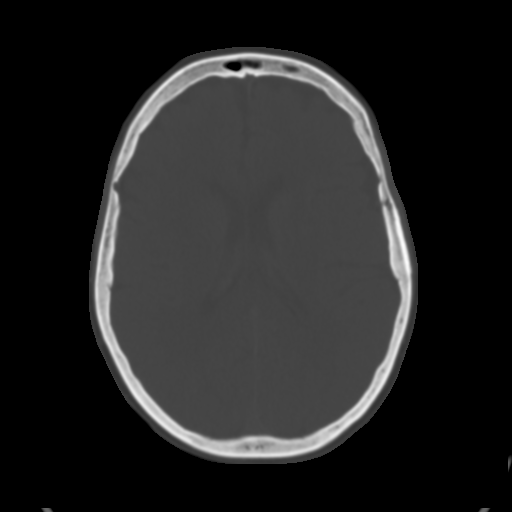
[im 20/32  brain]
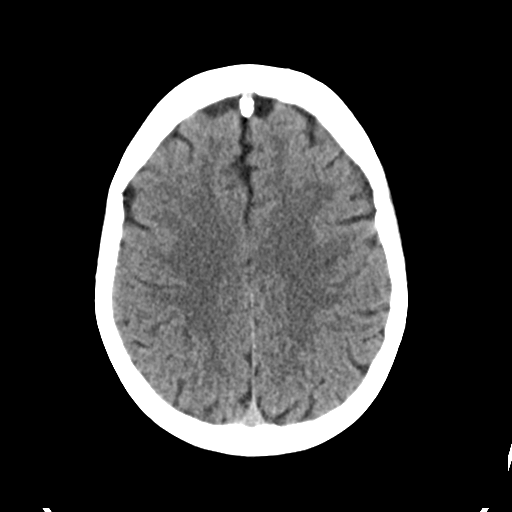
[im 23/32  brain]
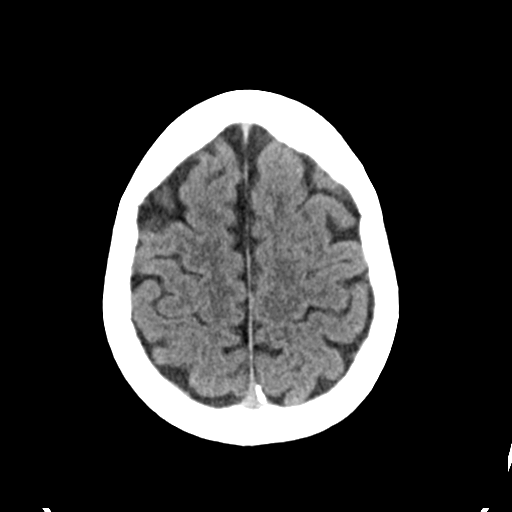
[im 26/32  brain]
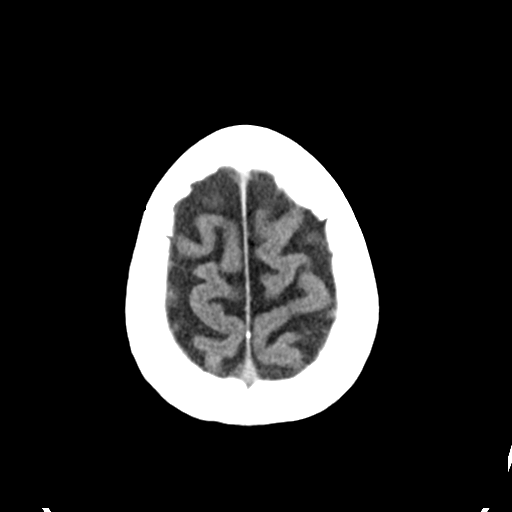
[im 29/32  brain]
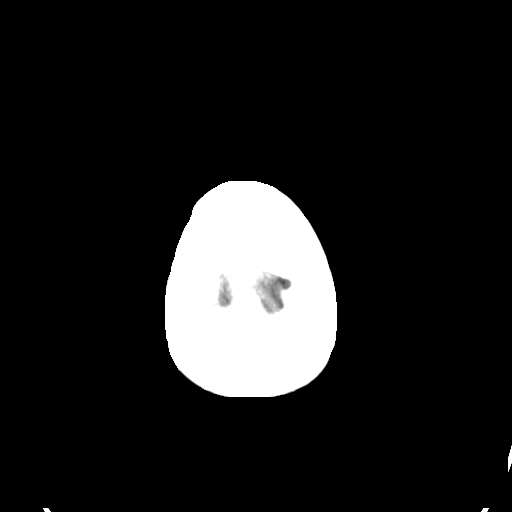
[im 29/32  bone]
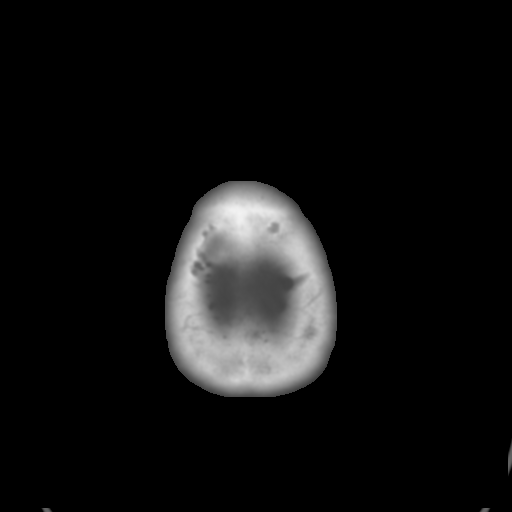

[Series 4: coronal soft tissue · coronal · 0.30mm/px · 3 of 63 slices shown]
[im 21/63  brain]
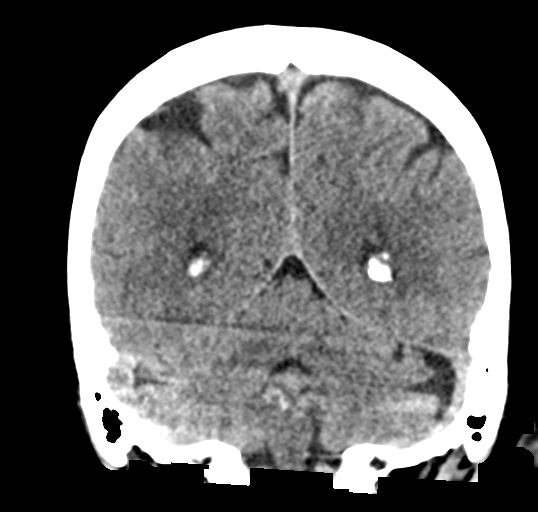
[im 28/63  brain]
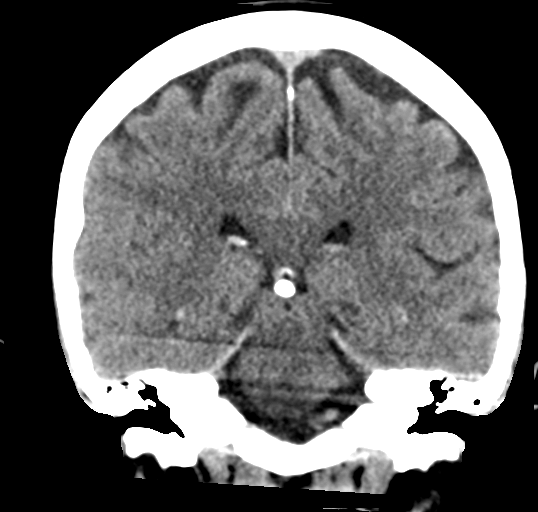
[im 35/63  brain]
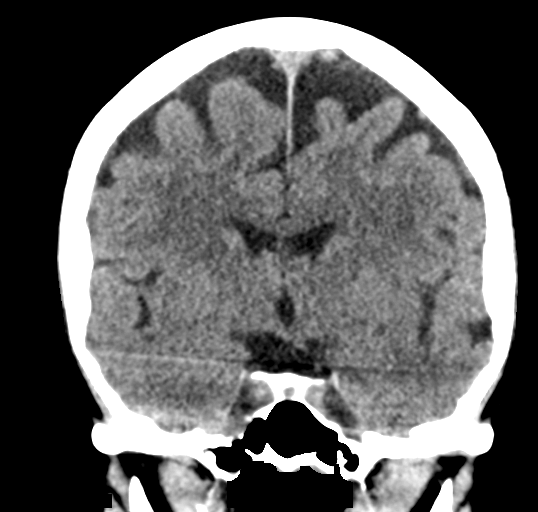

[Series 5: sagittal soft tissue · sagittal · 0.32mm/px · 3 of 48 slices shown]
[im 16/48  brain]
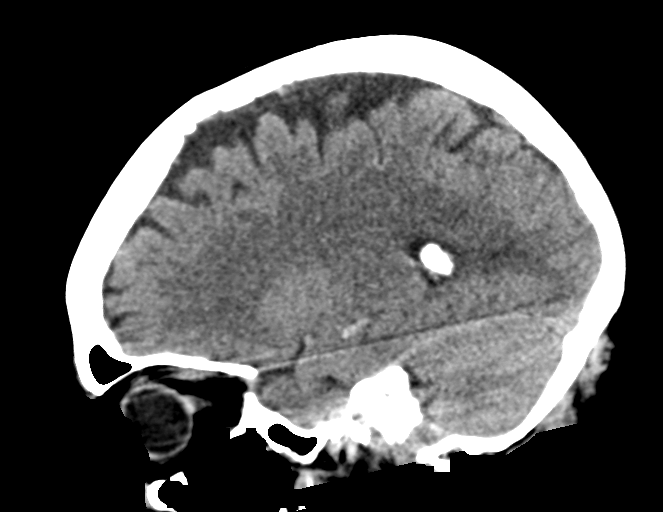
[im 24/48  brain]
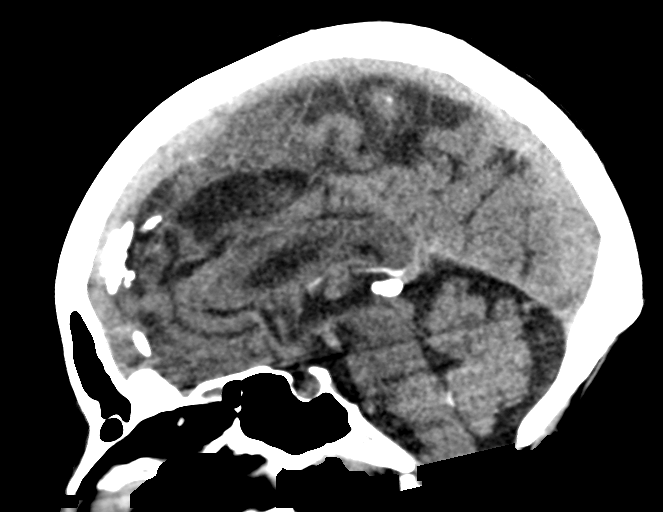
[im 32/48  brain]
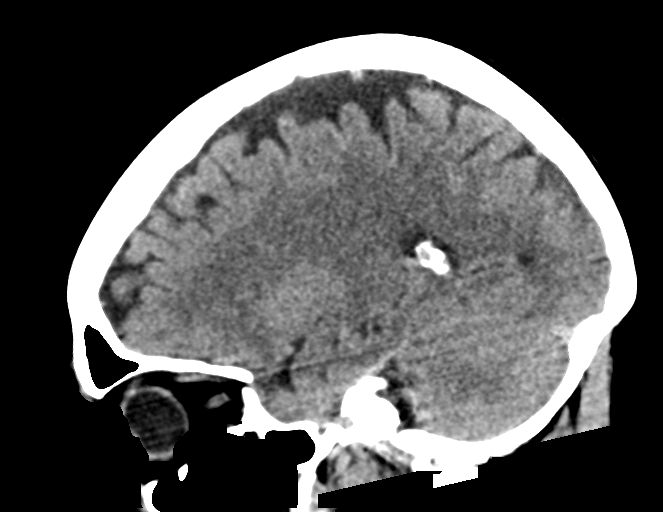

[15 of 47 positions shown; findings below may reference images not displayed]

FINDINGS: CT HEAD FINDINGS

Brain: No evidence of acute infarction, hemorrhage, hydrocephalus,
extra-axial collection or mass lesion/mass effect.

Vascular: No hyperdense vessel or unexpected calcification.

Skull: Normal. Negative for fracture or focal lesion.

Other: None.

CT MAXILLOFACIAL FINDINGS

Osseous: No fracture or mandibular dislocation. No destructive
process.

Orbits: Negative. No traumatic or inflammatory finding.

Sinuses: Clear.

Soft tissues: Negative.
IMPRESSION: 1. Normal head CT.
2. No abnormality seen in maxillofacial region.

## 2020-07-25 IMAGING — CR DG SHOULDER 2+V*L*
3 series · 3 of 3 positions shown · non-contrast
Comparison: None.

CLINICAL DATA: Post fall, now with shoulder pain.

EXAM:
LEFT SHOULDER - 2+ VIEW

[shoulder ap neutral]
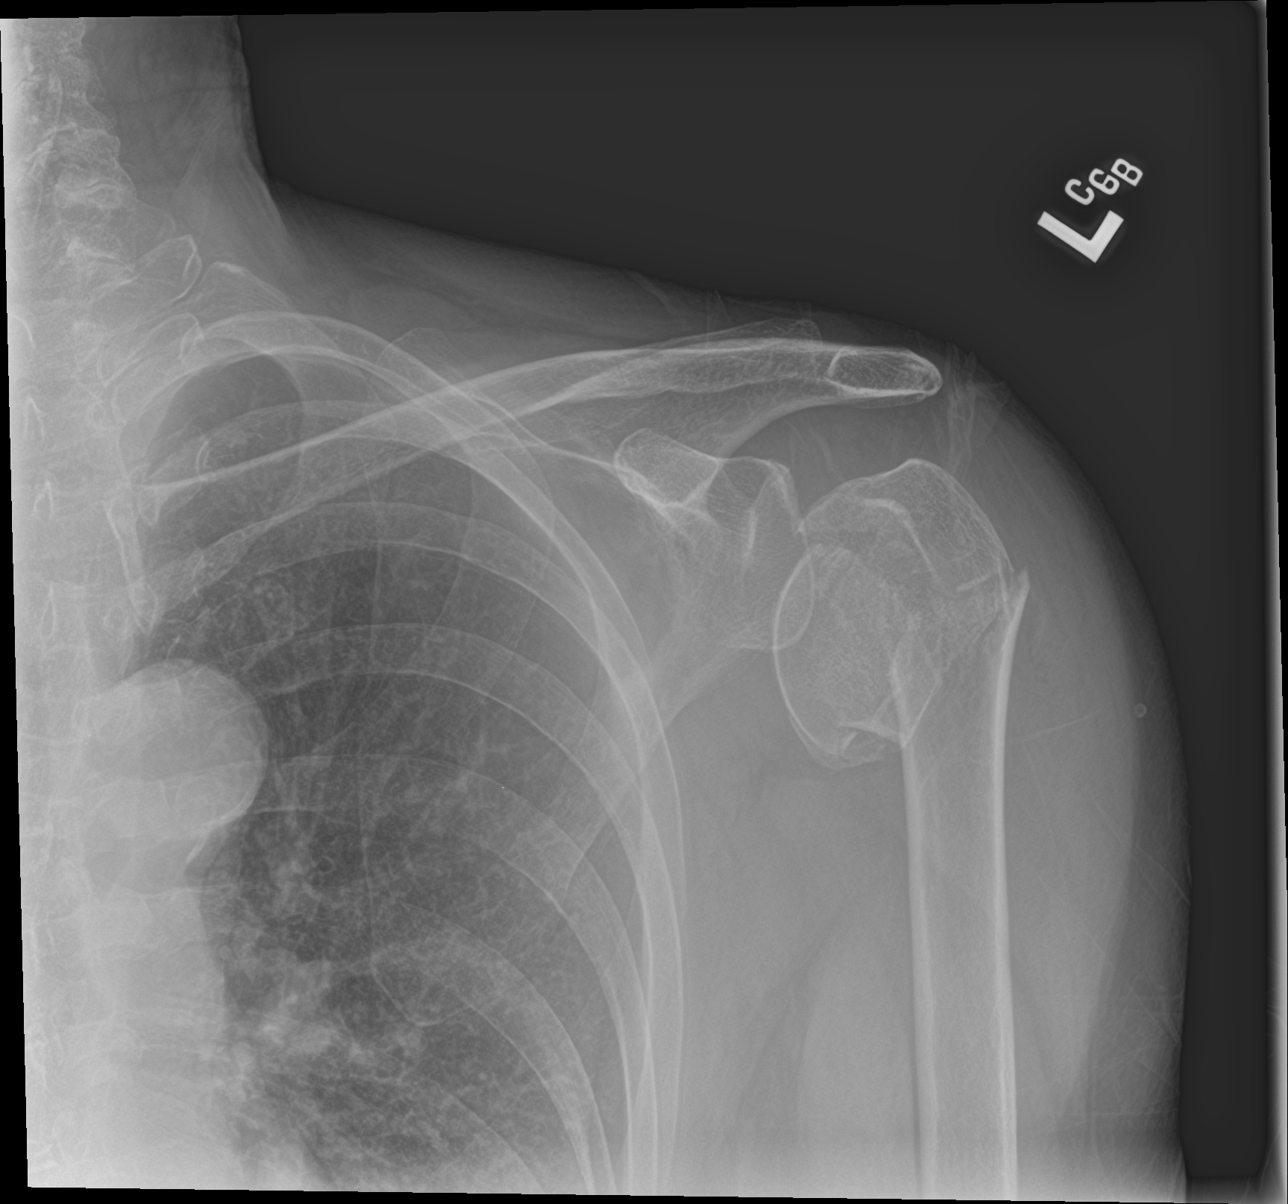

[shoulder grashey]
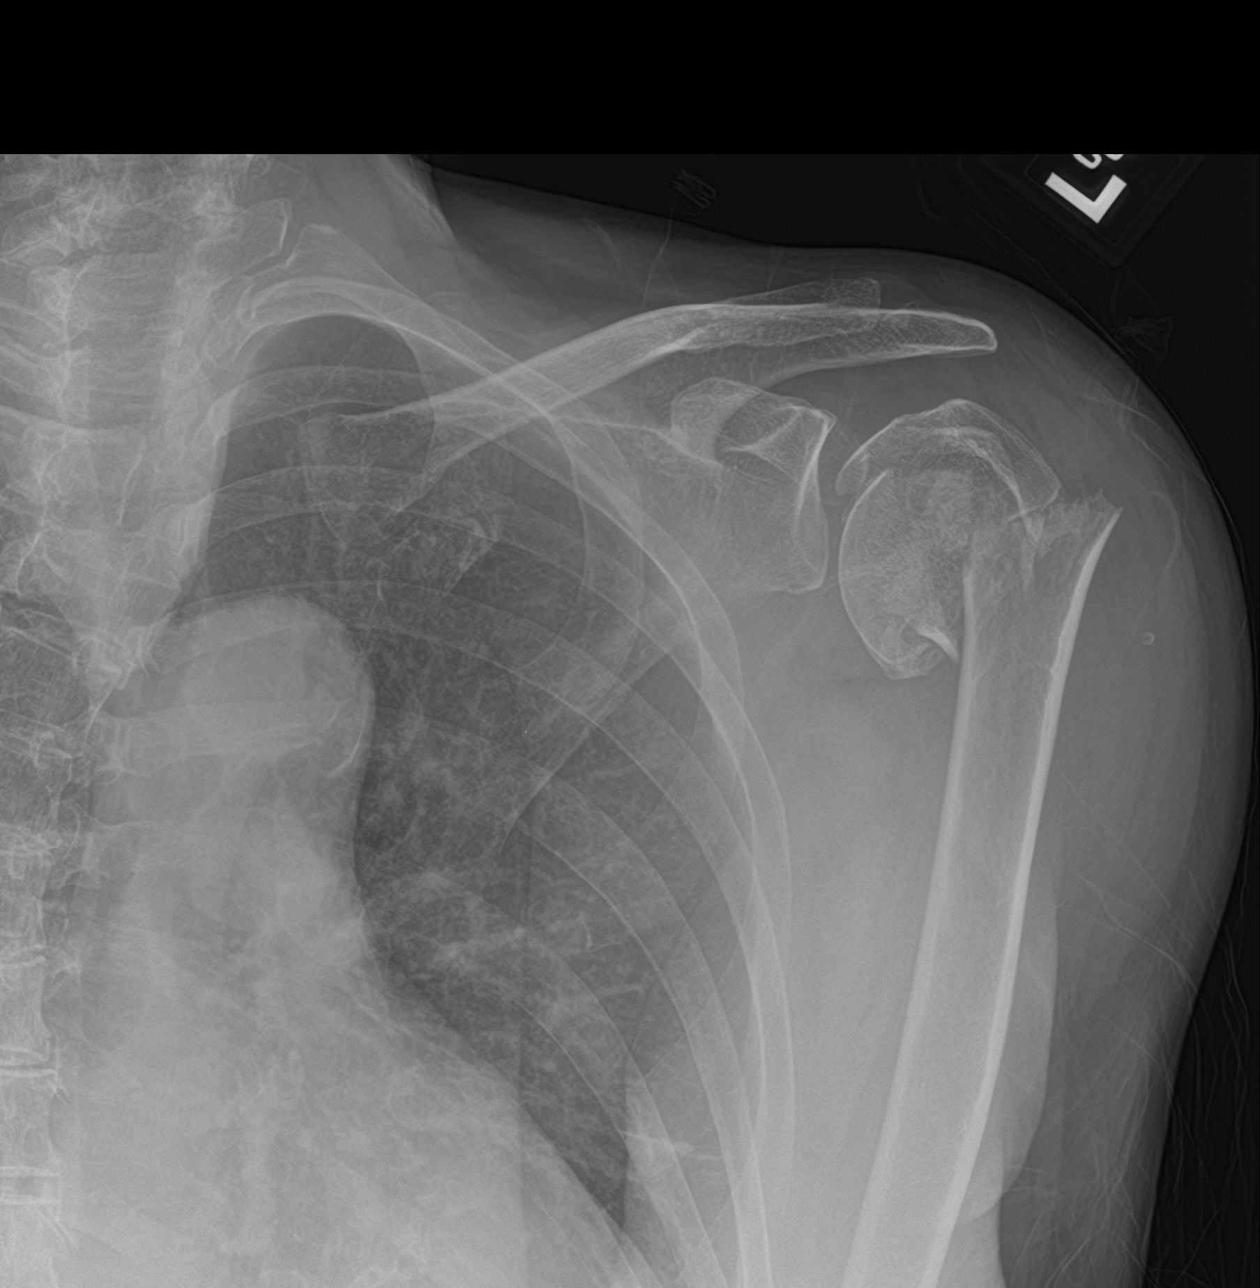

[shoulder y view]
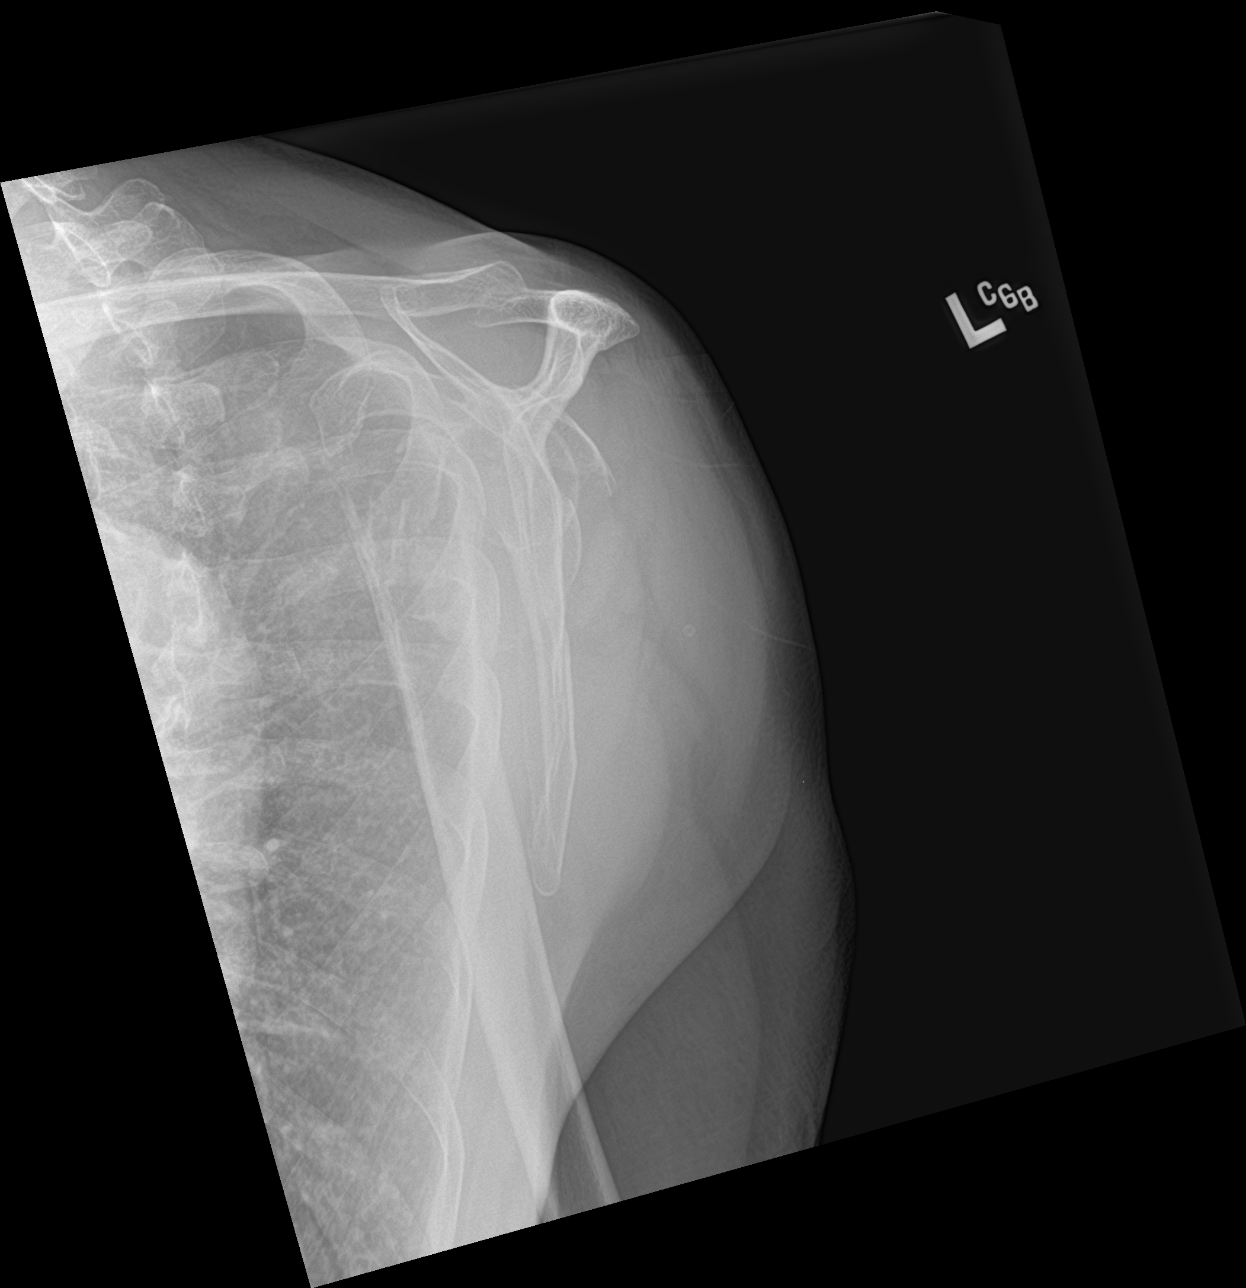

[3 of 3 positions shown; findings below may reference images not displayed]

FINDINGS: There is an acute comminuted fracture involving both the anatomic
and surgical necks of the proximal humerus with foreshortening and
angulation, apex posterior and extension of the fracture to the
glenohumeral joint. No additional fractures identified.
Acromioclavicular joint spaces appear preserved.

Expected adjacent soft tissue swelling.  No radiopaque foreign body

Limited visualization of the adjacent thorax demonstrates
atherosclerotic plaque within the aortic arch.
IMPRESSION: Acute, comminuted, displaced fracture involving both the anatomic
and surgical necks of the proximal humerus with extension to the
glenohumeral joint.

## 2020-07-25 IMAGING — CT CT SHOULDER*L* W/O CM
3 series · 13 of 33 positions shown, 16 images · non-contrast
Comparison: Radiograph dated [DATE].

CLINICAL DATA: 69-year-old female with fall and left humeral neck
fracture.

EXAM:
CT OF THE UPPER LEFT EXTREMITY WITHOUT CONTRAST
TECHNIQUE: Multidetector CT imaging of the upper left extremity was performed
according to the standard protocol.

[Series 5: ax st · axial · 0.34mm/px · z∈[-227,-103]mm · 5 of 90 slices shown, 7 images]
[im 14/90  soft-tissue]
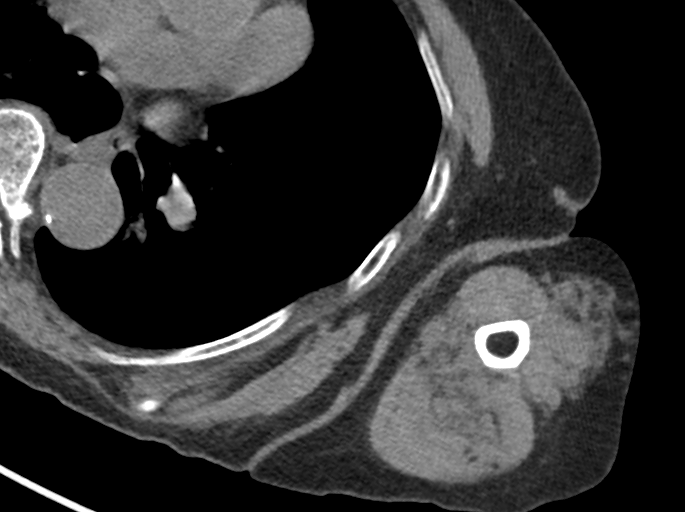
[im 14/90  bone]
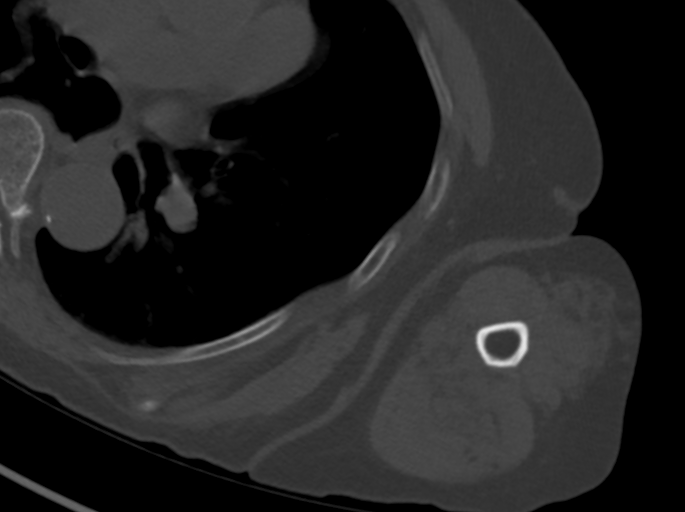
[im 28/90  bone]
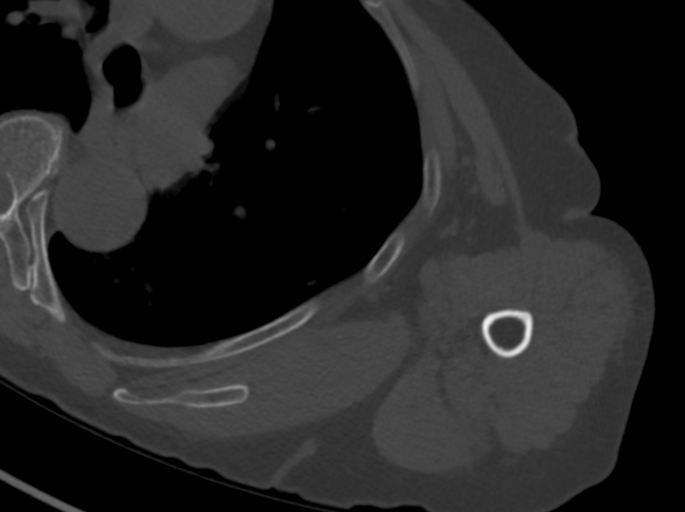
[im 48/90  bone]
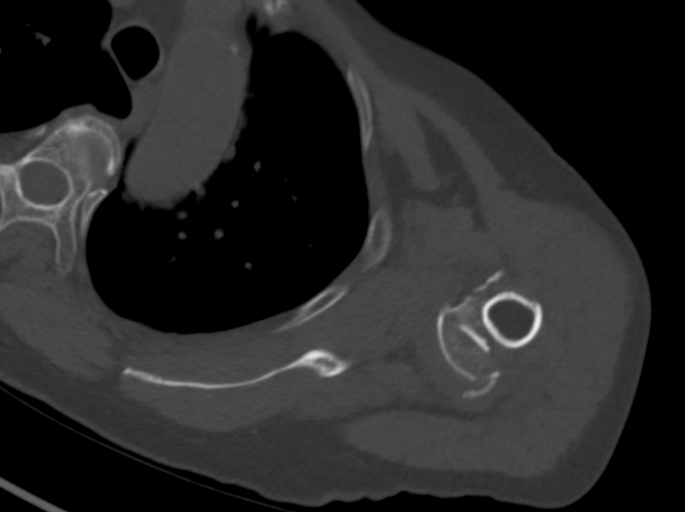
[im 62/90  bone]
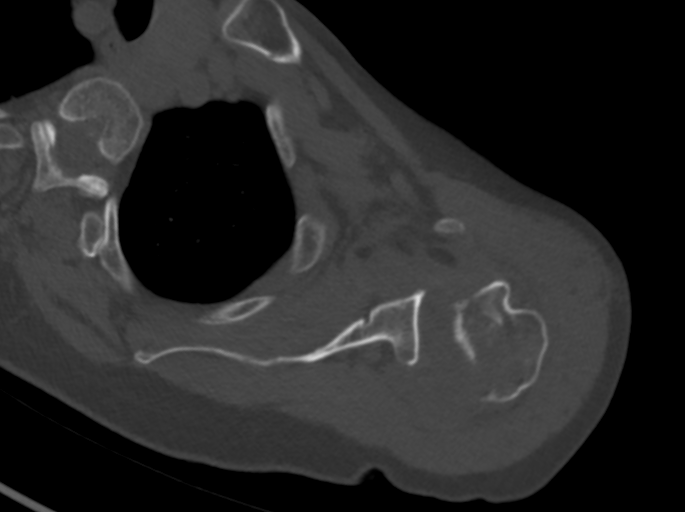
[im 76/90  soft-tissue]
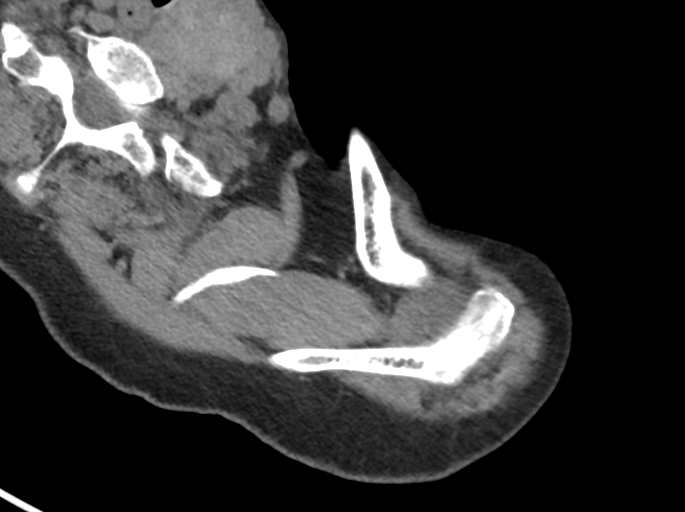
[im 76/90  bone]
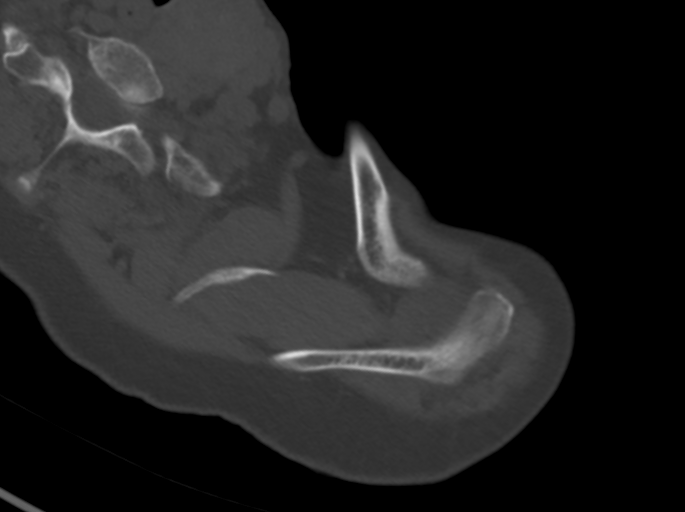

[Series 7: cor st · coronal · 0.35mm/px · 3 of 87 slices shown]
[im 18/87  bone]
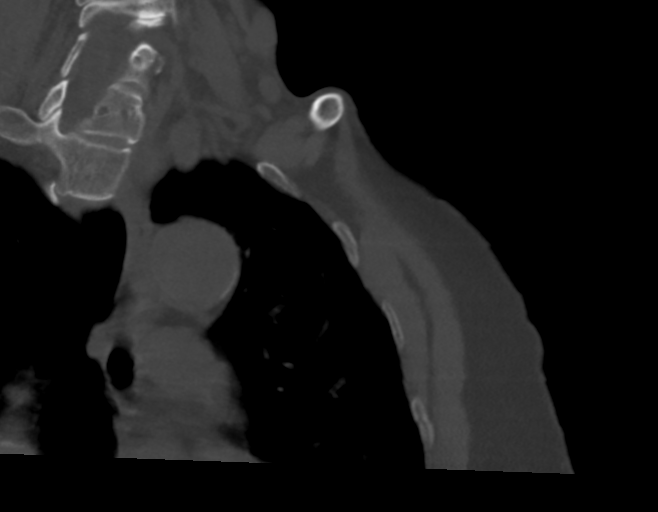
[im 35/87  bone]
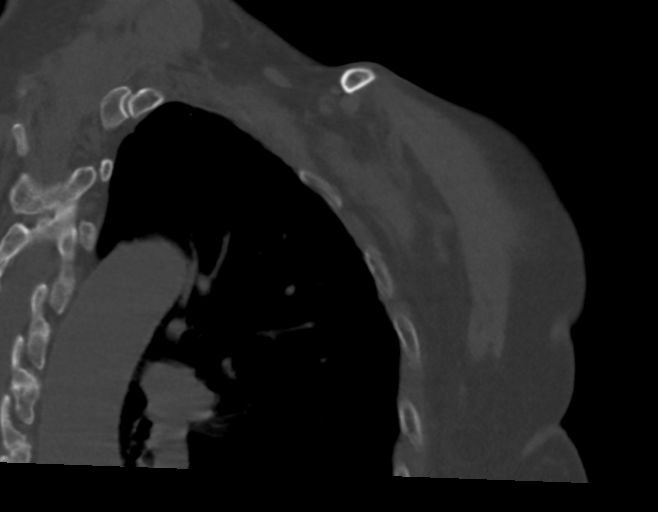
[im 52/87  bone]
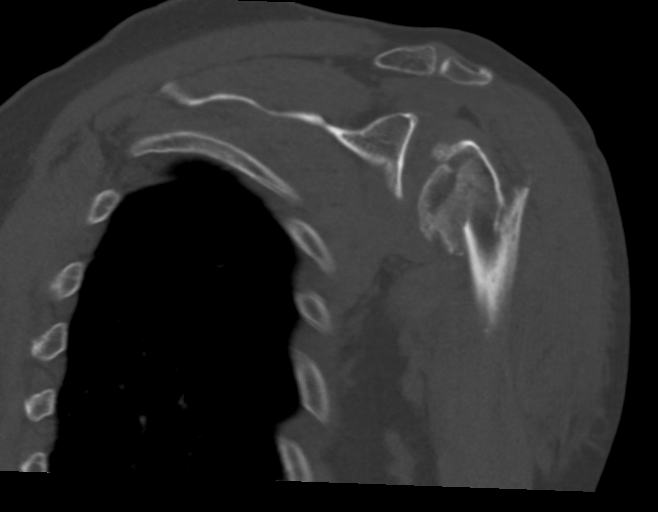

[Series 9: sag st · sagittal · 0.34mm/px · 5 of 115 slices shown, 6 images]
[im 39/115  bone]
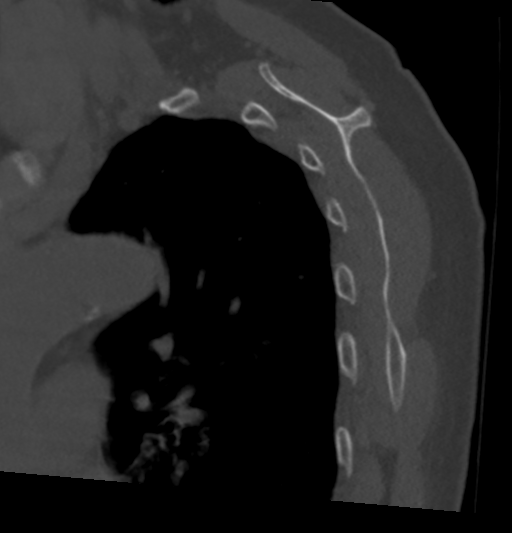
[im 48/115  bone]
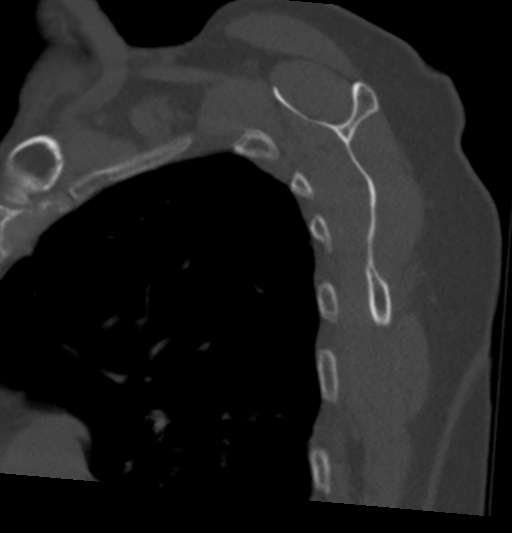
[im 58/115  soft-tissue]
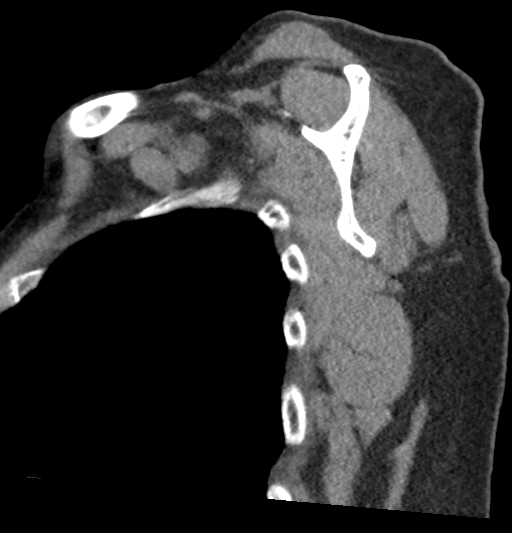
[im 58/115  bone]
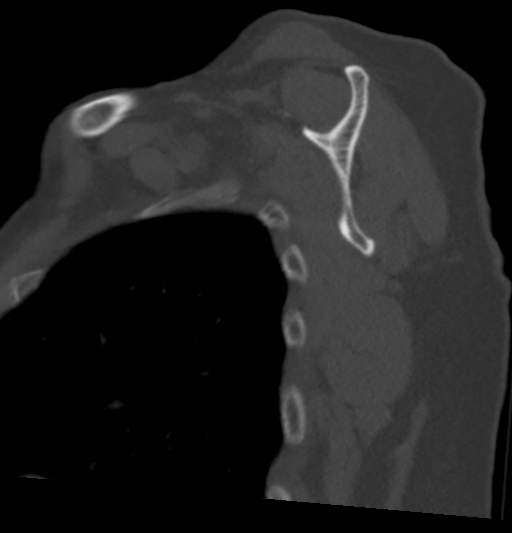
[im 67/115  bone]
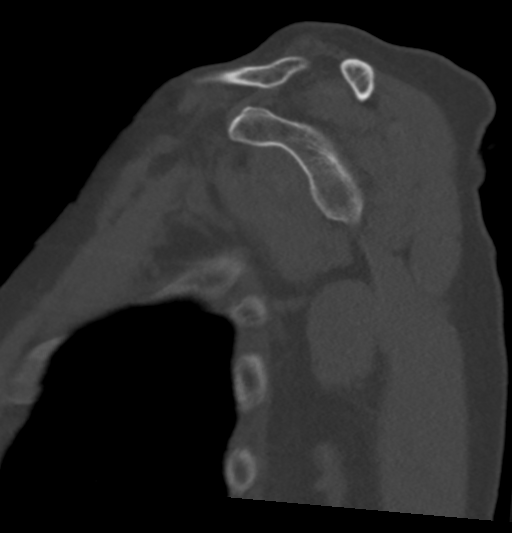
[im 77/115  bone]
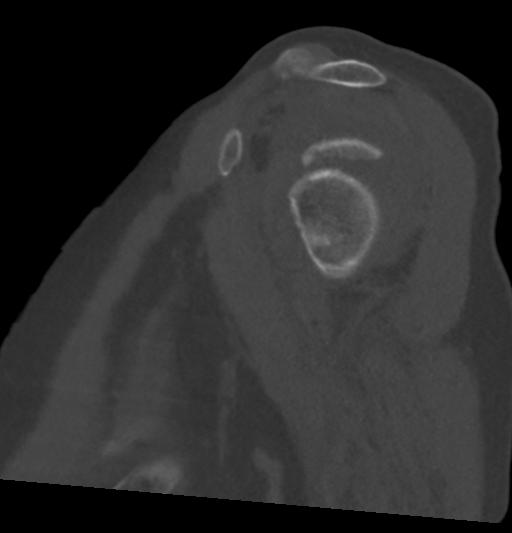

[13 of 33 positions shown; findings below may reference images not displayed]

FINDINGS: Bones/Joint/Cartilage

There is a comminuted fracture of the left humeral neck and head
with displaced fracture fragments. There is inferior positioning of
the articular surface of the humeral head in relation to the glenoid
consistent with a partial dislocation or subluxation. No other acute
fracture.

Ligaments

Suboptimally assessed by CT.

Muscles and Tendons

There is intramuscular and articular edema and hematoma. No
drainable fluid collection.

Soft tissues

Edema. No fluid collection.
IMPRESSION: Comminuted fracture of the left humeral neck and head with inferior
positioning of the articular surface of the humeral head in relation
to the glenoid consistent with a partial dislocation or subluxation.

## 2020-07-25 IMAGING — CT CT ELBOW*R* W/O CM
2 of 3 series · 6 of 18 positions shown, 7 images · non-contrast
Comparison: Radiographs from [DATE]

CLINICAL DATA: Radial and ulnar fractures.

EXAM:
CT OF THE RIGHT ELBOW WITHOUT CONTRAST
TECHNIQUE: Multidetector CT imaging of the RIGHT ELBOW was performed according
to the standard protocol.

[Series 7: ax st · oblique · 0.25mm/px · 3 of 83 slices shown, 4 images]
[im 21/83  soft-tissue]
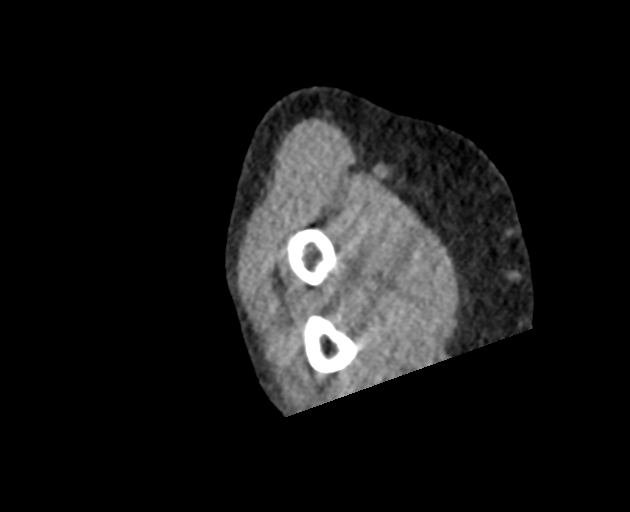
[im 21/83  bone]
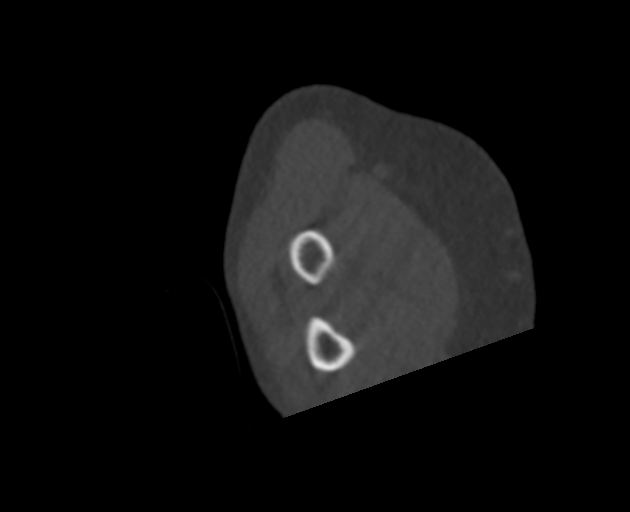
[im 42/83  bone]
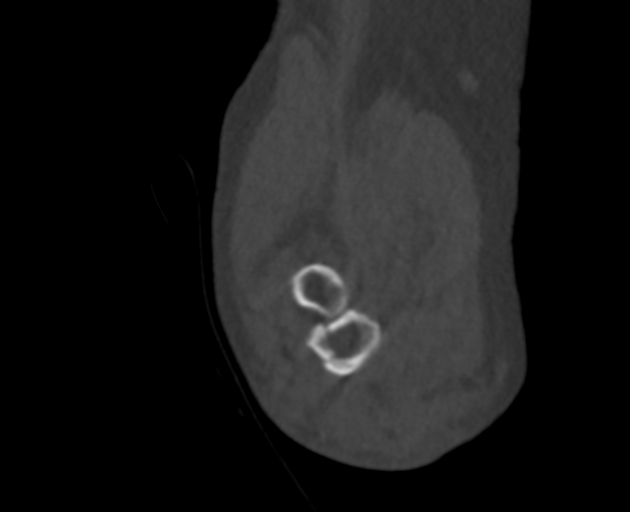
[im 62/83  bone]
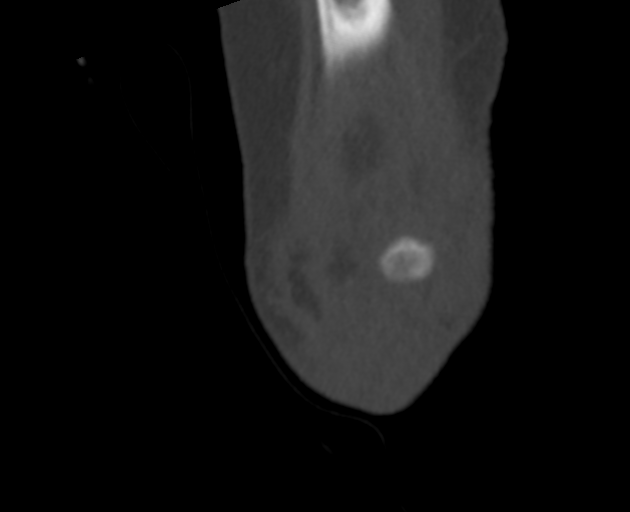

[Series 11: sag st · coronal · 0.25mm/px · 3 of 78 slices shown]
[im 31/78  bone]
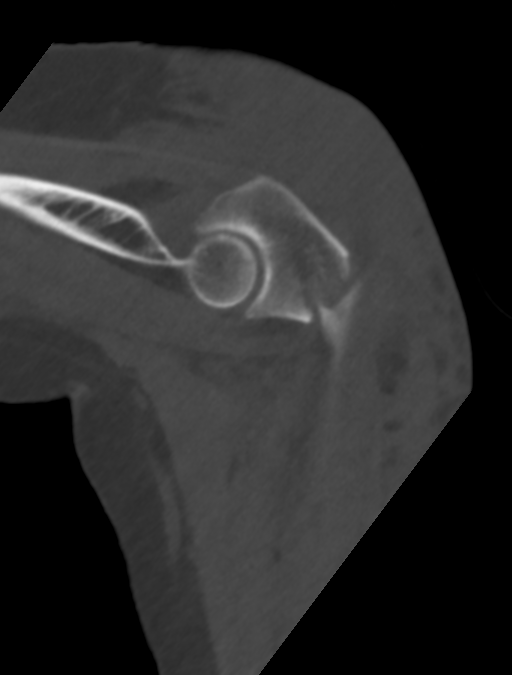
[im 39/78  bone]
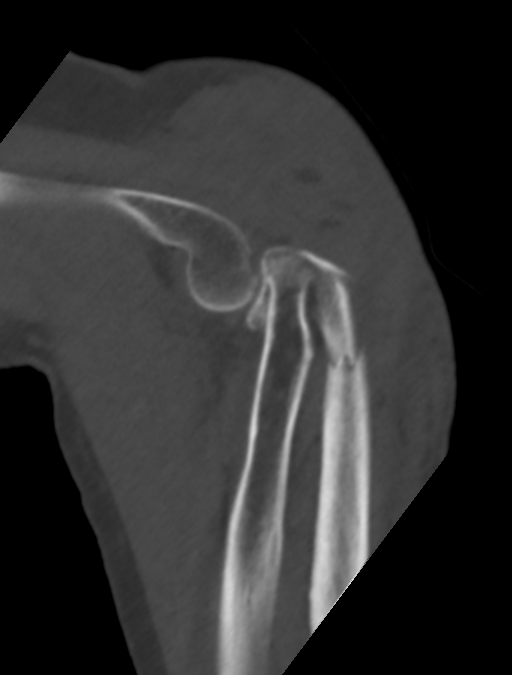
[im 47/78  bone]
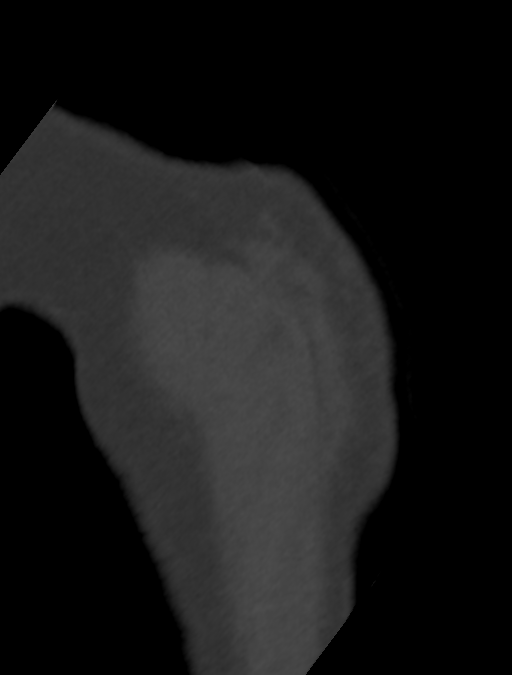

[6 of 18 positions shown; findings below may reference images not displayed]

FINDINGS: Bones/Joint/Cartilage

The distal humerus appears intact.

Comminuted fracture of the proximal ulnar metaphysis with a dominant
transverse component shown on image 48 of series 10. There is also a
posterolateral intermediary fragment measuring 2.3 cm as well as a
lateral intermediary fragment measuring 2.1 cm. The dominant shaft
fragment is laterally displaced about 9 mm with respect to the
dominant proximal fragment along with some mild apex posterior
angulation.

There is an impacted transverse fracture of the radial head/neck
with almost 90 degrees of angulation between the remaining articular
fragment and the shaft fragment as shown on image 41 of series 10.

Elbow joint effusion.

Ligaments

Suboptimally assessed by CT.

Muscles and Tendons

Expected edema in musculature surrounding the fracture sites.

Soft tissues

Subcutaneous edema along the elbow especially posteriorly, suspected
blood products or fluid in the olecranon bursa.

3D multiplanar reconstruction was performed for further depiction of
the fractures.
IMPRESSION: 1. Comminuted fracture of the proximal ulnar metaphysis. Mild
angulation and moderate displacement.
2. Prominently angulated fracture at the junction of the radial head
and neck.
3. Elbow joint effusion.
4. Subcutaneous edema along the elbow especially posteriorly,
suspected blood products or fluid in the olecranon bursa.

## 2020-07-25 IMAGING — CR DG ELBOW COMPLETE 3+V*R*
4 series · 4 of 4 positions shown · non-contrast
Comparison: None

CLINICAL DATA: Post fall, now with right elbow pain.

EXAM:
RIGHT ELBOW - COMPLETE 3+ VIEW

[elbow ap]
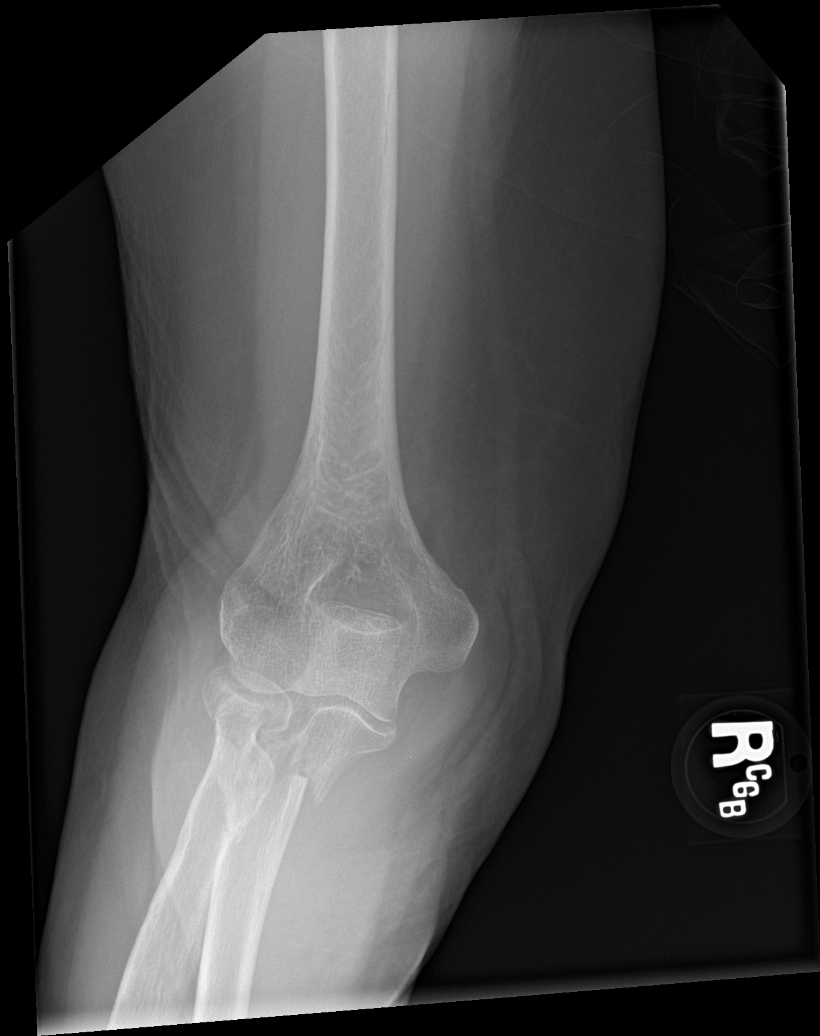

[elbow obl (1 of 2)]
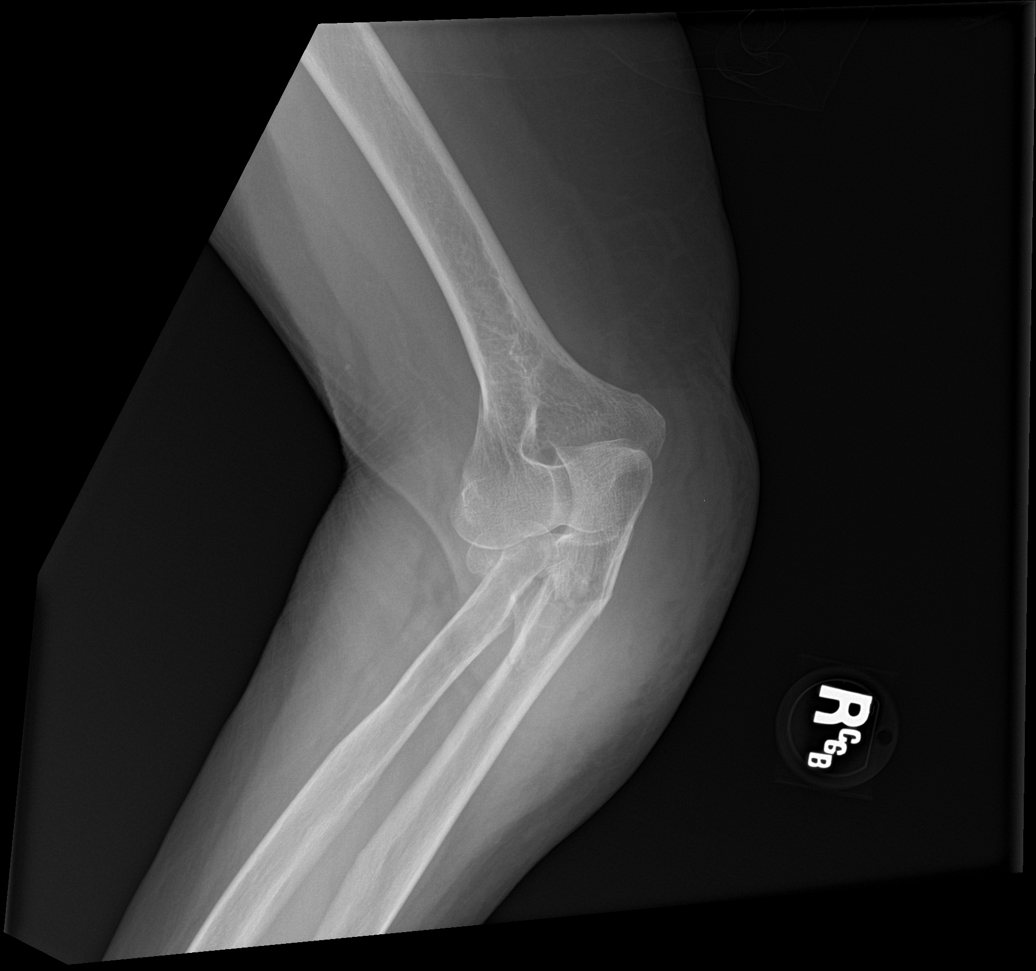

[elbow obl (2 of 2)]
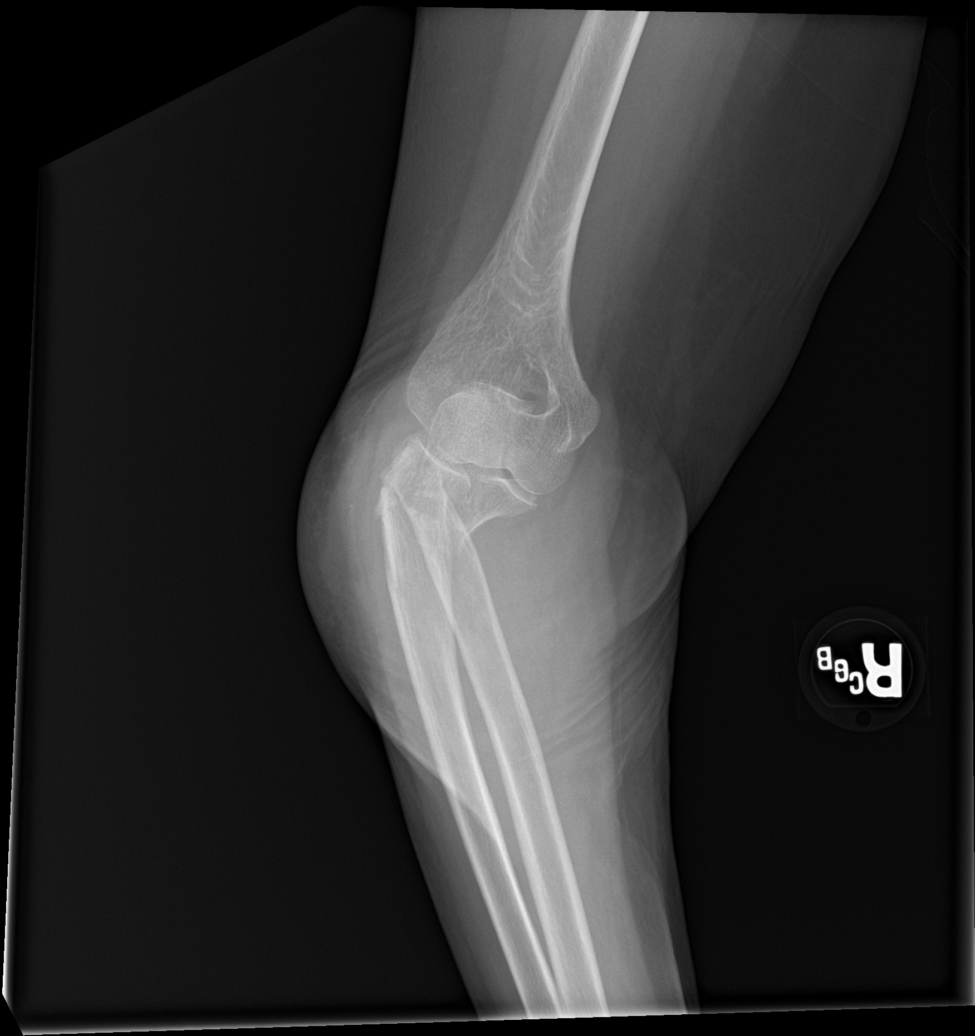

[elbow lat]
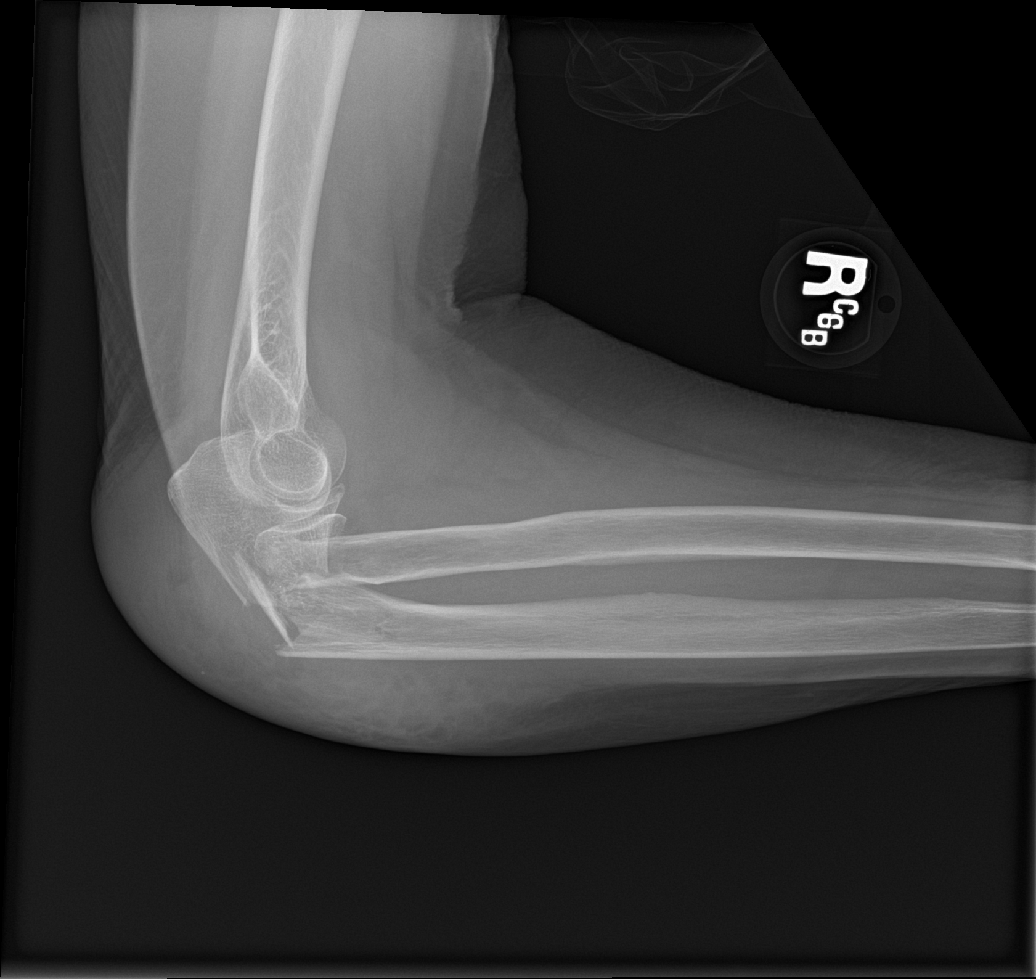

[4 of 4 positions shown; findings below may reference images not displayed]

FINDINGS: Acute displaced fractures involving the proximal radius and ulna
with angulation and foreshortening, apex dorsal. No definitive
intra-articular extension. Expected adjacent soft tissue swelling.
No radiopaque foreign body.
IMPRESSION: Acute, angulated fractures involving the proximal radius and ulna
without definitive intra-articular extension.

## 2020-07-25 MED ORDER — OXYCODONE-ACETAMINOPHEN 5-325 MG PO TABS
1.0000 | ORAL_TABLET | Freq: Once | ORAL | Status: AC
  Administered 2020-07-25: 1 via ORAL
  Filled 2020-07-25: qty 1

## 2020-07-25 MED ORDER — ONDANSETRON 8 MG PO TBDP
8.0000 mg | ORAL_TABLET | Freq: Once | ORAL | Status: AC
  Administered 2020-07-25: 8 mg via ORAL
  Filled 2020-07-25: qty 1

## 2020-07-25 MED ORDER — CYCLOBENZAPRINE HCL 10 MG PO TABS
5.0000 mg | ORAL_TABLET | Freq: Once | ORAL | Status: AC
  Administered 2020-07-25: 5 mg via ORAL
  Filled 2020-07-25: qty 1

## 2020-07-25 MED ORDER — HYDROCODONE-ACETAMINOPHEN 5-325 MG PO TABS
1.0000 | ORAL_TABLET | Freq: Once | ORAL | Status: AC
  Administered 2020-07-25: 1 via ORAL
  Filled 2020-07-25: qty 1

## 2020-07-25 MED ORDER — ONDANSETRON 4 MG PO TBDP
4.0000 mg | ORAL_TABLET | Freq: Once | ORAL | Status: AC
  Administered 2020-07-25: 4 mg via ORAL
  Filled 2020-07-25: qty 1

## 2020-07-25 MED ORDER — TRAMADOL HCL 50 MG PO TABS
100.0000 mg | ORAL_TABLET | Freq: Once | ORAL | Status: AC
Start: 2020-07-25 — End: 2020-07-25
  Administered 2020-07-25: 100 mg via ORAL
  Filled 2020-07-25: qty 2

## 2020-07-25 NOTE — ED Notes (Signed)
Pt taken to CT via wheelchair.

## 2020-07-25 NOTE — ED Notes (Signed)
Dr. Krasinski at bedside. 

## 2020-07-25 NOTE — ED Notes (Signed)
Patient transported to CT 

## 2020-07-25 NOTE — ED Triage Notes (Signed)
Pt comes via POV from home with c/o fall. Pt states she tripped and fell forward onto concrete. Pt has abrasion noted to nose and side of face. Pt states pain to left shoulder and right elbow.  Pt unsure if she hit her head. No loc. Pt denies any blood thinners.

## 2020-07-25 NOTE — Consult Note (Addendum)
ORTHOPAEDIC CONSULTATION  REQUESTING PHYSICIAN: Lucrezia Starch, MD  Chief Complaint: Right elbow and left shoulder pain status post fall  HPI: Kathleen Blankenship is a 69 y.o. female who complains of pain to her left shoulder and right elbow after mechanical fall onto concrete.  Patient denies loss of consciousness.  She has a contusion on bridge of her nose/forehead.  Patient denies any numbness or tingling in her upper extremities.  Patient denies use of anticoagulants.  She is seen in the emergency department with an adult female companion.  Patient lives independently at baseline.  Past Medical History:  Diagnosis Date  . Arthritis    RA  . Hypertension    Past Surgical History:  Procedure Laterality Date  . CORONARY/GRAFT ACUTE MI REVASCULARIZATION N/A 05/28/2017   Procedure: Coronary/Graft Acute MI Revascularization;  Surgeon: Isaias Cowman, MD;  Location: [facility];  Service: Cardiovascular;  Laterality: N/A;  . LEFT HEART CATH AND CORONARY ANGIOGRAPHY N/A 05/28/2017   Procedure: LEFT HEART CATH AND CORONARY ANGIOGRAPHY;  Surgeon: Isaias Cowman, MD;  Location: [facility];  Service: Cardiovascular;  Laterality: N/A;  . THYROID LOBECTOMY     Social History   Socioeconomic History  . Marital status: Divorced    Spouse name: Not on file  . Number of children: Not on file  . Years of education: Not on file  . Highest education level: Not on file  Occupational History  . Not on file  Tobacco Use  . Smoking status: Current Every Day Smoker    Packs/day: 1.00    Years: 37.00    Pack years: 37.00    Types: Cigarettes  . Smokeless tobacco: Never Used  Vaping Use  . Vaping Use: Never assessed  Substance and Sexual Activity  . Alcohol use: No  . Drug use: No  . Sexual activity: Never  Other Topics Concern  . Not on file  Social History Narrative  . Not on file   Social Determinants of Health   Financial Resource Strain:   . Difficulty  of Paying Living Expenses: Not on file  Food Insecurity:   . Worried About Charity fundraiser in the Last Year: Not on file  . Ran Out of Food in the Last Year: Not on file  Transportation Needs:   . Lack of Transportation (Medical): Not on file  . Lack of Transportation (Non-Medical): Not on file  Physical Activity:   . Days of Exercise per Week: Not on file  . Minutes of Exercise per Session: Not on file  Stress:   . Feeling of Stress : Not on file  Social Connections:   . Frequency of Communication with Friends and Family: Not on file  . Frequency of Social Gatherings with Friends and Family: Not on file  . Attends Religious Services: Not on file  . Active Member of Clubs or Organizations: Not on file  . Attends Archivist Meetings: Not on file  . Marital Status: Not on file   History reviewed. No pertinent family history. Allergies  Allergen Reactions  . Codeine Hives   Prior to Admission medications   Medication Sig Start Date End Date Taking? Authorizing Provider  albuterol (PROVENTIL HFA;VENTOLIN HFA) 108 (90 Base) MCG/ACT inhaler Inhale 2 puffs into the lungs every 6 (six) hours as needed for wheezing or shortness of breath. 05/29/17   Epifanio Lesches, MD  atorvastatin (LIPITOR) 40 MG tablet Take 1 tablet (40 mg total) by mouth daily at 6  PM. 05/29/17   Epifanio Lesches, MD  colchicine 0.6 MG tablet Take 1 tablet (0.6 mg total) by mouth daily. 05/30/17   Epifanio Lesches, MD  inFLIXimab (REMICADE) 100 MG injection Inject 100 mg into the vein.    [provider]  leflunomide (ARAVA) 20 MG tablet Take 20 mg by mouth daily.    [provider]  losartan (COZAAR) 50 MG tablet Take 50 mg by mouth daily.    [provider]  sertraline (ZOLOFT) 50 MG tablet Take 50 mg by mouth at bedtime.    [provider]  traMADol (ULTRAM) 50 MG tablet Take 50 mg by mouth every 6 (six) hours as needed.    [provider]   DG  Elbow Complete Right  Result Date: 07/25/2020 CLINICAL DATA:  Post fall, now with right elbow pain. EXAM: RIGHT ELBOW - COMPLETE 3+ VIEW COMPARISON:  None FINDINGS: Acute displaced fractures involving the proximal radius and ulna with angulation and foreshortening, apex dorsal. No definitive intra-articular extension. Expected adjacent soft tissue swelling. No radiopaque foreign body. IMPRESSION: Acute, angulated fractures involving the proximal radius and ulna without definitive intra-articular extension. Electronically Signed   By: Sandi Mariscal M.D.   On: 07/25/2020 14:13   CT Head Wo Contrast  Result Date: 07/25/2020 CLINICAL DATA:  Facial trauma after fall. EXAM: CT HEAD WITHOUT CONTRAST CT MAXILLOFACIAL WITHOUT CONTRAST TECHNIQUE: Multidetector CT imaging of the head and maxillofacial structures were performed using the standard protocol without intravenous contrast. Multiplanar CT image reconstructions of the maxillofacial structures were also generated. COMPARISON:  None. FINDINGS: CT HEAD FINDINGS Brain: No evidence of acute infarction, hemorrhage, hydrocephalus, extra-axial collection or mass lesion/mass effect. Vascular: No hyperdense vessel or unexpected calcification. Skull: Normal. Negative for fracture or focal lesion. Other: None. CT MAXILLOFACIAL FINDINGS Osseous: No fracture or mandibular dislocation. No destructive process. Orbits: Negative. No traumatic or inflammatory finding. Sinuses: Clear. Soft tissues: Negative. IMPRESSION: 1. Normal head CT. 2. No abnormality seen in maxillofacial region. Electronically Signed   By: Marijo Conception M.D.   On: 07/25/2020 12:53   DG Shoulder Left  Result Date: 07/25/2020 CLINICAL DATA:  Post fall, now with shoulder pain. EXAM: LEFT SHOULDER - 2+ VIEW COMPARISON:  None. FINDINGS: There is an acute comminuted fracture involving both the anatomic and surgical necks of the proximal humerus with foreshortening and angulation, apex posterior and  extension of the fracture to the glenohumeral joint. No additional fractures identified. Acromioclavicular joint spaces appear preserved. Expected adjacent soft tissue swelling.  No radiopaque foreign body Limited visualization of the adjacent thorax demonstrates atherosclerotic plaque within the aortic arch. IMPRESSION: Acute, comminuted, displaced fracture involving both the anatomic and surgical necks of the proximal humerus with extension to the glenohumeral joint. Electronically Signed   By: Sandi Mariscal M.D.   On: 07/25/2020 14:15   CT Maxillofacial Wo Contrast  Result Date: 07/25/2020 CLINICAL DATA:  Facial trauma after fall. EXAM: CT HEAD WITHOUT CONTRAST CT MAXILLOFACIAL WITHOUT CONTRAST TECHNIQUE: Multidetector CT imaging of the head and maxillofacial structures were performed using the standard protocol without intravenous contrast. Multiplanar CT image reconstructions of the maxillofacial structures were also generated. COMPARISON:  None. FINDINGS: CT HEAD FINDINGS Brain: No evidence of acute infarction, hemorrhage, hydrocephalus, extra-axial collection or mass lesion/mass effect. Vascular: No hyperdense vessel or unexpected calcification. Skull: Normal. Negative for fracture or focal lesion. Other: None. CT MAXILLOFACIAL FINDINGS Osseous: No fracture or mandibular dislocation. No destructive process. Orbits: Negative. No traumatic or inflammatory finding. Sinuses: Clear.  Soft tissues: Negative. IMPRESSION: 1. Normal head CT. 2. No abnormality seen in maxillofacial region. Electronically Signed   By: Marijo Conception M.D.   On: 07/25/2020 12:53    Positive ROS: All other systems have been reviewed and were otherwise negative with the exception of those mentioned in the HPI and as above.  Physical Exam: General: Alert, no acute distress  MUSCULOSKELETAL:   Left upper extremity: Patient skin is intact.  There is swelling but no erythema or ecchymosis.  Her arm and forearm compartments are soft  and compressible.  She has intact sensation to light touch throughout the left upper extremity.  She has a palpable radial pulse.  She has intact sensation to light touch in all 5 digits.  She can flex and extend all 5 digits of the left hand and flex and extend her wrist.  Right upper extremity: Patient's right elbow demonstrates intact skin.  There is significant focal swelling over the olecranon due to hematoma formation.  Patient has intact sensation to light touch throughout the right upper extremity including all 5 digits of her right hand.  She has a palpable radial pulse.  Her arm and forearm compartments are soft and compressible.  She can flex and extend all her fingers as well as her right wrist.  Assessment: Left closed, comminuted proximal humerus fracture Right comminuted fractures of the olecranon and radial head with angulation and displacement  Plan: I explained to the patient the nature of her fractures.  Both of the patient's fractures are complicated and will require surgical intervention.  Patient lives alone and is unable to be discharged due to injuries of both upper extremities.  I have contacted the orthopedic trauma specialist, Dr. Katha Hamming, at [facility] who is agreed to fix the patient's right elbow fracture tomorrow.  He feels that the left proximal humerus fracture will require a reverse total shoulder at a later date.  Dr. Doreatha Martin PA, [facility] Hurst, has recommended transfer of the patient to [facility] for admission to the hospitalist service for preoperative clearance.  Prior to transfer, the patient will undergo CT scans of her right elbow and left shoulder with 3D reconstructions for surgical planning.  Patient will have her right elbow placed in a posterior splint and a sling applied to the left shoulder.  I have explained this plan to the patient who understands and is in agreement with the plan.   I have also discussed this plan with the PA, Anselmo Pickler who  understands the plan and will arrange for [facility] transfer to [facility] this evening after the patient's CT scans have been completed.    Thornton Park, MD    07/25/2020 3:54 PM

## 2020-07-25 NOTE — ED Notes (Signed)
Accepted by Dr. Lorin Mercy at [facility], per [facility] waiting for bed assignment [phone]

## 2020-07-25 NOTE — Progress Notes (Signed)
[facility] to [facility] transfer:  Patient with h/o HTN; RA on immunosuppresants; and CAD s/p CABG who presented after a mechanical fall today.  PA [employer].  Fractured L shoulder and R elbow.  Dr. Mack Guise reviewed images - complicated fractures and so needs transfer to [facility] for trauma surgery here tomorrow.  Needs further imaging which will be done prior to transfer.  Labs pending but should be available at the time of transfer.  [facility] does not currently have a surgical bed but is expected to have one this evening.  She is scheduled for ORIF elbow tomorrow at [facility] at 35 with Dr. Doreatha Martin.    Carlyon Shadow, M.D.

## 2020-07-25 NOTE — ED Provider Notes (Addendum)
[facility] Emergency Department Provider Note  ____________________________________________   First MD Initiated Contact with Patient 07/25/20 1231     (approximate)  I have reviewed the triage vital signs and the nursing notes.   HISTORY  Chief Complaint Fall   HPI Kathleen Blankenship is a 69 y.o. female with history of rheumatoid arthritis on immunosuppressive therapy, hypertension, prior right thyroid lobectomy, and coronary artery graft, presents for evaluation after mechanical fall.  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  She fell without outstretched arms, landing on her bit arms and hit her face.  There was no reported LOC, nausea, vomiting, or dizziness.  Patient did not report any bleeding from the face or nose.  She presents to the ED via personal vehicle from the salon, with complaints of pain and disability to the bilateral upper extremities.  No other reports of pain including chest pain, abdominal pain, neck pain, or lower extremity pain reported.      Past Medical History:  Diagnosis Date  . Arthritis    RA  . Hypertension     Patient Active Problem List   Diagnosis Date Noted  . Chest pain on exertion 05/28/2017  . Chest pain 05/28/2017    Past Surgical History:  Procedure Laterality Date  . CORONARY/GRAFT ACUTE MI REVASCULARIZATION N/A 05/28/2017   Procedure: Coronary/Graft Acute MI Revascularization;  Surgeon: Isaias Cowman, MD;  Location: [place] [facility];  Service: Cardiovascular;  Laterality: N/A;  . LEFT HEART CATH AND CORONARY ANGIOGRAPHY N/A 05/28/2017   Procedure: LEFT HEART CATH AND CORONARY ANGIOGRAPHY;  Surgeon: Isaias Cowman, MD;  Location: [place] [facility];  Service: Cardiovascular;  Laterality: N/A;  . THYROID LOBECTOMY      Prior to Admission medications   Medication Sig Start Date End Date Taking? Authorizing Provider    albuterol (PROVENTIL HFA;VENTOLIN HFA) 108 (90 Base) MCG/ACT inhaler Inhale 2 puffs into the lungs every 6 (six) hours as needed for wheezing or shortness of breath. 05/29/17   Epifanio Lesches, MD  atorvastatin (LIPITOR) 40 MG tablet Take 1 tablet (40 mg total) by mouth daily at 6 PM. 05/29/17   Epifanio Lesches, MD  colchicine 0.6 MG tablet Take 1 tablet (0.6 mg total) by mouth daily. 05/30/17   Epifanio Lesches, MD  inFLIXimab (REMICADE) 100 MG injection Inject 100 mg into the vein.    [provider]  leflunomide (ARAVA) 20 MG tablet Take 20 mg by mouth daily.    [provider]  losartan (COZAAR) 50 MG tablet Take 50 mg by mouth daily.    [provider]  sertraline (ZOLOFT) 50 MG tablet Take 50 mg by mouth at bedtime.    [provider]  traMADol (ULTRAM) 50 MG tablet Take 50 mg by mouth every 6 (six) hours as needed.    [provider]    Allergies Codeine  History reviewed. No pertinent family history.  Social History Social History   Tobacco Use  . Smoking status: Current Every Day Smoker    Packs/day: 1.00    Years: 37.00    Pack years: 37.00    Types: Cigarettes  . Smokeless tobacco: Never Used  Vaping Use  . Vaping Use: Never assessed  Substance Use Topics  . Alcohol use: No  . Drug use: No    Review of Systems Constitutional: No fever/chills. Eyes: No visual changes. ENT: No sore throat. Cardiovascular: Denies chest  pain. Respiratory: Denies shortness of breath. Gastrointestinal: No abdominal pain.  No nausea, no vomiting.  No diarrhea.  No constipation. Genitourinary: Negative for dysuria. Musculoskeletal: Negative for back pain.  Reports bilateral upper extremity pain and disability.  Specifically right elbow pain and deformity is noted.  Left shoulder pain and disability is reported. Skin: Negative for rash. Neurological: Cranial nerves II to XII grossly intact.  Negative for headaches, focal weakness or  numbness. ____________________________________________   PHYSICAL EXAM:  VITAL SIGNS: ED Triage Vitals [07/25/20 1218]  Enc Vitals Group     BP 108/74     Pulse Rate 80     Resp 18     Temp (!) 97.5 F (36.4 C)     Temp src      SpO2 100 %     Weight 127 lb (57.6 kg)     Height 5\' 3"  (1.6 m)     Head Circumference      Peak Flow      Pain Score 6     Pain Loc      Pain Edu?      Excl. in [place]?    Constitutional: Alert and oriented. Well appearing and in no acute distress. A&O x 4. GCS=15 Eyes: Conjunctivae are normal. EOMI. Head: Atraumatic. Small nasal bridge abrasion.  Nose: No congestion/rhinnorhea. Dried blood in the vestibule Mouth/Throat: Mucous membranes are moist.  Oropharynx non-erythematous. Neck: No stridor.  No cervical spine tenderness to palpation. Cardiovascular: Normal rate, regular rhythm. Grossly normal heart sounds.  Good peripheral circulation. Respiratory: Normal respiratory effort.  No retractions. Lungs CTAB. Gastrointestinal: Soft and nontender. No distention. No abdominal bruits. No CVA tenderness. Musculoskeletal: No lower extremity tenderness nor edema.  Decreased left shoulder ROM due to pain. Right elbow effusion, deformity, ecchymosis noted. No LE joint effusions. Neurologic:  CN II-XII grossly intact. Normal composite fist bilaterally. Normal speech and language. No gross focal neurologic deficits are appreciated. No gait instability. Skin:  Skin is warm, dry and intact. No rash noted. Psychiatric: Mood and affect are normal. Speech and behavior are normal.  ____________________________________________   LABS (all labs ordered are listed, but only abnormal results are displayed)  Labs Reviewed  BASIC METABOLIC PANEL - Abnormal; Notable for the following components:      Result Value   CO2 21 (*)    Glucose, Bld 101 (*)    All other components within normal limits  CBC WITH DIFFERENTIAL/PLATELET - Abnormal; Notable for the following  components:   WBC 12.9 (*)    RDW 15.9 (*)    Neutro Abs 10.8 (*)    All other components within normal limits  RESP PANEL BY RT-PCR (FLU A&B, COVID) ARPGX2  URINALYSIS, COMPLETE (UACMP) WITH MICROSCOPIC   ____________________________________________  EKG  NSR 67 bpm PR Interval 148 ms QRS duration 86 ms Normal axis No STEMI ____________________________________________  RADIOLOGY I, Melvenia Needles, personally viewed and evaluated these images (plain radiographs) as part of my medical decision making, as well as reviewing the written report by the radiologist.  ED MD interpretation: Comminuted fracture of the proximal humerus on the left.                                      Complex, comminuted fractures of the ulnar radius proximally    Official radiology report(s): DG Elbow Complete Right  Result Date: 07/25/2020 CLINICAL DATA:  Post fall, now  with right elbow pain. EXAM: RIGHT ELBOW - COMPLETE 3+ VIEW COMPARISON:  None FINDINGS: Acute displaced fractures involving the proximal radius and ulna with angulation and foreshortening, apex dorsal. No definitive intra-articular extension. Expected adjacent soft tissue swelling. No radiopaque foreign body. IMPRESSION: Acute, angulated fractures involving the proximal radius and ulna without definitive intra-articular extension. Electronically Signed   By: Sandi Mariscal M.D.   On: 07/25/2020 14:13   CT Head Wo Contrast  Result Date: 07/25/2020 CLINICAL DATA:  Facial trauma after fall. EXAM: CT HEAD WITHOUT CONTRAST CT MAXILLOFACIAL WITHOUT CONTRAST TECHNIQUE: Multidetector CT imaging of the head and maxillofacial structures were performed using the standard protocol without intravenous contrast. Multiplanar CT image reconstructions of the maxillofacial structures were also generated. COMPARISON:  None. FINDINGS: CT HEAD FINDINGS Brain: No evidence of acute infarction, hemorrhage, hydrocephalus, extra-axial collection or mass  lesion/mass effect. Vascular: No hyperdense vessel or unexpected calcification. Skull: Normal. Negative for fracture or focal lesion. Other: None. CT MAXILLOFACIAL FINDINGS Osseous: No fracture or mandibular dislocation. No destructive process. Orbits: Negative. No traumatic or inflammatory finding. Sinuses: Clear. Soft tissues: Negative. IMPRESSION: 1. Normal head CT. 2. No abnormality seen in maxillofacial region. Electronically Signed   By: Marijo Conception M.D.   On: 07/25/2020 12:53   CT SHOULDER LEFT WO CONTRAST  Result Date: 07/25/2020 CLINICAL DATA:  69 year old female with fall and left humeral neck fracture. EXAM: CT OF THE UPPER LEFT EXTREMITY WITHOUT CONTRAST TECHNIQUE: Multidetector CT imaging of the upper left extremity was performed according to the standard protocol. COMPARISON:  Radiograph dated 07/25/2020. FINDINGS: Bones/Joint/Cartilage There is a comminuted fracture of the left humeral neck and head with displaced fracture fragments. There is inferior positioning of the articular surface of the humeral head in relation to the glenoid consistent with a partial dislocation or subluxation. No other acute fracture. Ligaments Suboptimally assessed by CT. Muscles and Tendons There is intramuscular and articular edema and hematoma. No drainable fluid collection. Soft tissues Edema. No fluid collection. IMPRESSION: Comminuted fracture of the left humeral neck and head with inferior positioning of the articular surface of the humeral head in relation to the glenoid consistent with a partial dislocation or subluxation. Electronically Signed   By: Anner Crete M.D.   On: 07/25/2020 17:38   CT ELBOW RIGHT WO CONTRAST  Result Date: 07/25/2020 CLINICAL DATA:  Radial and ulnar fractures. EXAM: CT OF THE RIGHT ELBOW WITHOUT CONTRAST TECHNIQUE: Multidetector CT imaging of the RIGHT ELBOW was performed according to the standard protocol. COMPARISON:  Radiographs from 07/25/2020 FINDINGS:  Bones/Joint/Cartilage The distal humerus appears intact. Comminuted fracture of the proximal ulnar metaphysis with a dominant transverse component shown on image 48 of series 10. There is also a posterolateral intermediary fragment measuring 2.3 cm as well as a lateral intermediary fragment measuring 2.1 cm. The dominant shaft fragment is laterally displaced about 9 mm with respect to the dominant proximal fragment along with some mild apex posterior angulation. There is an impacted transverse fracture of the radial head/neck with almost 90 degrees of angulation between the remaining articular fragment and the shaft fragment as shown on image 41 of series 10. Elbow joint effusion. Ligaments Suboptimally assessed by CT. Muscles and Tendons Expected edema in musculature surrounding the fracture sites. Soft tissues Subcutaneous edema along the elbow especially posteriorly, suspected blood products or fluid in the olecranon bursa. 3D multiplanar reconstruction was performed for further depiction of the fractures. IMPRESSION: 1. Comminuted fracture of the proximal ulnar metaphysis. Mild angulation and moderate  displacement. 2. Prominently angulated fracture at the junction of the radial head and neck. 3. Elbow joint effusion. 4. Subcutaneous edema along the elbow especially posteriorly, suspected blood products or fluid in the olecranon bursa. Electronically Signed   By: Van Clines M.D.   On: 07/25/2020 17:41   CT 3D RECON AT SCANNER  Result Date: 07/25/2020 : 3D reconstruction of the left shoulder was performed. Please see accession number [id number] [facility] for diagnostic report. Electronically Signed   By: Van Clines M.D.   On: 07/25/2020 17:43   CT 3D RECON AT SCANNER  Result Date: 07/25/2020 CLINICAL DATA:  Radial and ulnar fractures. EXAM: CT OF THE RIGHT ELBOW WITHOUT CONTRAST TECHNIQUE: Multidetector CT imaging of the RIGHT ELBOW was performed according to the standard protocol. COMPARISON:   Radiographs from 07/25/2020 FINDINGS: Bones/Joint/Cartilage The distal humerus appears intact. Comminuted fracture of the proximal ulnar metaphysis with a dominant transverse component shown on image 48 of series 10. There is also a posterolateral intermediary fragment measuring 2.3 cm as well as a lateral intermediary fragment measuring 2.1 cm. The dominant shaft fragment is laterally displaced about 9 mm with respect to the dominant proximal fragment along with some mild apex posterior angulation. There is an impacted transverse fracture of the radial head/neck with almost 90 degrees of angulation between the remaining articular fragment and the shaft fragment as shown on image 41 of series 10. Elbow joint effusion. Ligaments Suboptimally assessed by CT. Muscles and Tendons Expected edema in musculature surrounding the fracture sites. Soft tissues Subcutaneous edema along the elbow especially posteriorly, suspected blood products or fluid in the olecranon bursa. 3D multiplanar reconstruction was performed for further depiction of the fractures. IMPRESSION: 1. Comminuted fracture of the proximal ulnar metaphysis. Mild angulation and moderate displacement. 2. Prominently angulated fracture at the junction of the radial head and neck. 3. Elbow joint effusion. 4. Subcutaneous edema along the elbow especially posteriorly, suspected blood products or fluid in the olecranon bursa. Electronically Signed   By: Van Clines M.D.   On: 07/25/2020 17:41   DG Shoulder Left  Result Date: 07/25/2020 CLINICAL DATA:  Post fall, now with shoulder pain. EXAM: LEFT SHOULDER - 2+ VIEW COMPARISON:  None. FINDINGS: There is an acute comminuted fracture involving both the anatomic and surgical necks of the proximal humerus with foreshortening and angulation, apex posterior and extension of the fracture to the glenohumeral joint. No additional fractures identified. Acromioclavicular joint spaces appear preserved. Expected  adjacent soft tissue swelling.  No radiopaque foreign body Limited visualization of the adjacent thorax demonstrates atherosclerotic plaque within the aortic arch. IMPRESSION: Acute, comminuted, displaced fracture involving both the anatomic and surgical necks of the proximal humerus with extension to the glenohumeral joint. Electronically Signed   By: Sandi Mariscal M.D.   On: 07/25/2020 14:15   CT Maxillofacial Wo Contrast  Result Date: 07/25/2020 CLINICAL DATA:  Facial trauma after fall. EXAM: CT HEAD WITHOUT CONTRAST CT MAXILLOFACIAL WITHOUT CONTRAST TECHNIQUE: Multidetector CT imaging of the head and maxillofacial structures were performed using the standard protocol without intravenous contrast. Multiplanar CT image reconstructions of the maxillofacial structures were also generated. COMPARISON:  None. FINDINGS: CT HEAD FINDINGS Brain: No evidence of acute infarction, hemorrhage, hydrocephalus, extra-axial collection or mass lesion/mass effect. Vascular: No hyperdense vessel or unexpected calcification. Skull: Normal. Negative for fracture or focal lesion. Other: None. CT MAXILLOFACIAL FINDINGS Osseous: No fracture or mandibular dislocation. No destructive process. Orbits: Negative. No traumatic or inflammatory finding. Sinuses: Clear. Soft tissues: Negative. IMPRESSION: 1.  Normal head CT. 2. No abnormality seen in maxillofacial region. Electronically Signed   By: Marijo Conception M.D.   On: 07/25/2020 12:53   ____________________________________________   PROCEDURES  Procedure(s) performed (including Critical Care):  Procedures  Ultram 100 mg PO Zofran 4 mg ODT Cyclobenzaprine 5 mg PO Norco 5-325 mg PO Right Elbow Posterior OCL Left shoulder sling ____________________________________________   INITIAL IMPRESSION / ASSESSMENT AND PLAN / ED COURSE  As part of my medical decision making, I reviewed the following data within the [place] Discussed with admitting physician  Wynetta Emery, Notes from prior ED visits and [place] Controlled Substance Database   ----------------------------------------- 4:07 PM on 07/25/2020 ----------------------------------------- Dr. Mack Guise (Ortho) has been in touch with [facility]. She will have 3-D CT imaging of the respective joints, and await transfer. The patient will be transferred to the hospitalist service for [facility] consultation. The plan is for surgical repair of the right elbow in the morning.   ----------------------------------------- 4:30 PM on 07/25/2020 ----------------------------------------- S/W Dr. Karmen Bongo: she is the accepting attending for the [facility] service.   ----------------------------------------- 6:15 PM on 07/25/2020 ----------------------------------------- Patient accepted. She will be transported via [facility] when IP bed is assigned. Patient/family aware. Her pain is well controlled with PO meds, as she is leary of stronger meds at this time. She is aware that she will have to transition to IV meds after midnight due to her NPO status at that hour.   Patient with ED evaluation of injury sustained following a mechanical fall. She presents for injuries and was found to have complex fractures of the left shoulder and the right elbow. Patient has been consulted by orthopedics here in the ED, and her complex fractures were deemed emergent, and needed trauma service level of intervention. Patient was agreeable,after some discussion with family and friends, she was agreeable to the plan to be admitted to the trauma service. This necessitated a transfer to [facility] for trauma level service care. Patient was appropriately splinted after CT images with 3D recon were obtained. Patient is comfortable at this time with pain under control, and will be n.p.o. at midnight.   ----------------------------------------- 8:04 PM on  07/25/2020 ----------------------------------------- Patient care transferred to J. Cuthriell, PA-C. He will manage patient pain prn until transfer is complete. Patient is well-fed, splinted, comfortable and pain-free at the time of this disposition. She has been updated on the current delays with transfer. ____________________________________________   FINAL CLINICAL IMPRESSION(S) / ED DIAGNOSES  Final diagnoses:  Shoulder fracture, left, closed, initial encounter  Closed fracture dislocation of right elbow, initial encounter     ED Discharge Orders    None      *Please note:  Kathleen Blankenship was evaluated in Emergency Department on 07/25/2020 for the symptoms described in the history of present illness. She was evaluated in the context of the global COVID-19 pandemic, which necessitated consideration that the patient might be at risk for infection with the SARS-CoV-2 virus that causes COVID-19. Institutional protocols and algorithms that pertain to the evaluation of patients at risk for COVID-19 are in a state of rapid change based on information released by regulatory bodies including the CDC and federal and state organizations. These policies and algorithms were followed during the patient's care in the ED.  Some ED evaluations and interventions may be delayed as a result of limited staffing during and the pandemic.*   Note:  This document was prepared using Dragon voice recognition  software and may include unintentional dictation errors.    Melvenia Needles, PA-C 07/25/20 1816    Melvenia Needles, PA-C 07/26/20 1016    Lucrezia Starch, MD 07/26/20 1032

## 2020-07-25 NOTE — ED Provider Notes (Signed)
-----------------------------------------   11:53 PM on 07/25/2020 -----------------------------------------  Blood pressure (!) 146/86, pulse 87, temperature (!) 97.5 F (36.4 C), resp. rate 17, height 5\' 3"  (1.6 m), weight 57.6 kg, SpO2 94 %.  Assuming care from [facility] [place].  In short, Kathleen Blankenship Blankenship is a 69 y.o. female with a chief complaint of Fall .  Refer to the original H&P for additional details.  The current plan of care is to await transport.  Patient presented to emergency department after a fall with fracture of the left shoulder and right elbow.  Patient has been evaluated by the previous ED provider, as well as the on-call orthopedic surgeon, Dr. Mack Guise.  Given the injuries, patient was unable to care for self at home, did require surgery to both affected joints.  At this time plan was to transfer to the medical service at [facility] pending surgery in the morning.  Unfortunately, bed availability at [facility] has been limited and her transfer has been postponed several times awaiting bed assignment.  Patient has been stable throughout the ED encounter.  She has requested pain medications every few hours as pain returned.  This has been provided along with antiemetics.  Otherwise patient is very stable, no complaints.  Apparently the new plan is to await transfer until the time that patient is scheduled for her surgery.  Patient will be transported directly from the ED to the OR at [facility] for orthopedic surgery.  I have turned over patient care to attending provider, Dr. Karma Greaser pending her overnight stay in the ED awaiting transport.  Attending provider is aware of patient's history, diagnosis, treatment plan.      Darletta Moll, PA-C 07/25/20 2357    Nance Pear, MD 07/29/20 1515

## 2020-07-25 NOTE — ED Provider Notes (Signed)
-----------------------------------------   11:49 PM on 07/25/2020 -----------------------------------------  Assuming care from [facility].  In short, Kathleen Blankenship is a 69 y.o. female with a chief complaint of bilateral upper extremity orthopedic injuries after a fall.  Refer to the original H&P for additional details.  The current plan of care is to keep the patient in the [facility] ED overnight.  There is no bed available at [facility], but she is going to the OR in the morning for orthopedic surgery.  The plan is for her to remain n.p.o. overnight and they will call in the morning when it is time to transport her so she can go directly to the OR.  The patient understands the plan.  I ordered normal saline 100 mL/h IV fluids.  She is stable at this time.    ----------------------------------------- 2:45 AM on 07/26/2020 -----------------------------------------  A bed became available at [facility] so the patient is to be transferred now (EMS is on the way).  I updated the patient she said her pain is well controlled right now but if she moves or gets worse, so I ordered a dose of morphine 4 mg IV to be administered prior to her transport.  She is hemodynamically stable, neurovascularly intact, and has no specific complaints or concerns at this time and she understands and agrees with the plan.  She is appropriate for transfer at this time.   Hinda Kehr, MD 07/26/20 [phone]

## 2020-07-26 ENCOUNTER — Inpatient Hospital Stay (HOSPITAL_COMMUNITY): Payer: Medicare HMO

## 2020-07-26 ENCOUNTER — Inpatient Hospital Stay (HOSPITAL_COMMUNITY)
Admission: RE | Admit: 2020-07-26 | Discharge: 2020-07-31 | DRG: 483 | Disposition: A | Discharge status: Home Health | Payer: Medicare HMO | Source: Other Acute Inpatient Hospital | Attending: Internal Medicine | Admitting: Internal Medicine

## 2020-07-26 ENCOUNTER — Encounter (HOSPITAL_COMMUNITY): Payer: Self-pay | Admitting: Internal Medicine

## 2020-07-26 ENCOUNTER — Inpatient Hospital Stay (HOSPITAL_COMMUNITY): Payer: Medicare HMO | Admitting: Certified Registered Nurse Anesthetist

## 2020-07-26 ENCOUNTER — Encounter (HOSPITAL_COMMUNITY)
Admission: RE | Disposition: A | Discharge status: Home Health | Payer: Self-pay | Source: Other Acute Inpatient Hospital | Attending: Internal Medicine

## 2020-07-26 ENCOUNTER — Inpatient Hospital Stay (HOSPITAL_COMMUNITY): Admission: RE | Admit: 2020-07-26 | Payer: Medicare HMO | Source: Home / Self Care | Admitting: Student

## 2020-07-26 DIAGNOSIS — Z419 Encounter for procedure for purposes other than remedying health state, unspecified: Secondary | ICD-10-CM

## 2020-07-26 DIAGNOSIS — Y9289 Other specified places as the place of occurrence of the external cause: Secondary | ICD-10-CM | POA: Diagnosis not present

## 2020-07-26 DIAGNOSIS — S52121A Displaced fracture of head of right radius, initial encounter for closed fracture: Secondary | ICD-10-CM | POA: Diagnosis not present

## 2020-07-26 DIAGNOSIS — W010XXA Fall on same level from slipping, tripping and stumbling without subsequent striking against object, initial encounter: Secondary | ICD-10-CM | POA: Diagnosis present

## 2020-07-26 DIAGNOSIS — G8918 Other acute postprocedural pain: Secondary | ICD-10-CM | POA: Diagnosis not present

## 2020-07-26 DIAGNOSIS — S4292XA Fracture of left shoulder girdle, part unspecified, initial encounter for closed fracture: Secondary | ICD-10-CM | POA: Diagnosis not present

## 2020-07-26 DIAGNOSIS — F17210 Nicotine dependence, cigarettes, uncomplicated: Secondary | ICD-10-CM | POA: Diagnosis present

## 2020-07-26 DIAGNOSIS — D62 Acute posthemorrhagic anemia: Secondary | ICD-10-CM | POA: Diagnosis not present

## 2020-07-26 DIAGNOSIS — M069 Rheumatoid arthritis, unspecified: Secondary | ICD-10-CM | POA: Diagnosis present

## 2020-07-26 DIAGNOSIS — Z96612 Presence of left artificial shoulder joint: Secondary | ICD-10-CM | POA: Diagnosis not present

## 2020-07-26 DIAGNOSIS — F32A Depression, unspecified: Secondary | ICD-10-CM | POA: Diagnosis not present

## 2020-07-26 DIAGNOSIS — Z471 Aftercare following joint replacement surgery: Secondary | ICD-10-CM | POA: Diagnosis not present

## 2020-07-26 DIAGNOSIS — Z09 Encounter for follow-up examination after completed treatment for conditions other than malignant neoplasm: Secondary | ICD-10-CM

## 2020-07-26 DIAGNOSIS — S42401A Unspecified fracture of lower end of right humerus, initial encounter for closed fracture: Secondary | ICD-10-CM | POA: Diagnosis not present

## 2020-07-26 DIAGNOSIS — Z79899 Other long term (current) drug therapy: Secondary | ICD-10-CM | POA: Diagnosis not present

## 2020-07-26 DIAGNOSIS — E890 Postprocedural hypothyroidism: Secondary | ICD-10-CM | POA: Diagnosis not present

## 2020-07-26 DIAGNOSIS — Z20822 Contact with and (suspected) exposure to covid-19: Secondary | ICD-10-CM | POA: Diagnosis present

## 2020-07-26 DIAGNOSIS — I2583 Coronary atherosclerosis due to lipid rich plaque: Secondary | ICD-10-CM | POA: Diagnosis not present

## 2020-07-26 DIAGNOSIS — E785 Hyperlipidemia, unspecified: Secondary | ICD-10-CM | POA: Diagnosis not present

## 2020-07-26 DIAGNOSIS — W19XXXA Unspecified fall, initial encounter: Secondary | ICD-10-CM | POA: Diagnosis not present

## 2020-07-26 DIAGNOSIS — S42242A 4-part fracture of surgical neck of left humerus, initial encounter for closed fracture: Secondary | ICD-10-CM | POA: Diagnosis present

## 2020-07-26 DIAGNOSIS — Z01818 Encounter for other preprocedural examination: Secondary | ICD-10-CM

## 2020-07-26 DIAGNOSIS — T148XXA Other injury of unspecified body region, initial encounter: Secondary | ICD-10-CM

## 2020-07-26 DIAGNOSIS — I10 Essential (primary) hypertension: Secondary | ICD-10-CM | POA: Diagnosis present

## 2020-07-26 DIAGNOSIS — S52131A Displaced fracture of neck of right radius, initial encounter for closed fracture: Secondary | ICD-10-CM | POA: Diagnosis present

## 2020-07-26 DIAGNOSIS — S52271A Monteggia's fracture of right ulna, initial encounter for closed fracture: Principal | ICD-10-CM | POA: Diagnosis present

## 2020-07-26 DIAGNOSIS — I2510 Atherosclerotic heart disease of native coronary artery without angina pectoris: Secondary | ICD-10-CM | POA: Diagnosis present

## 2020-07-26 DIAGNOSIS — S42292A Other displaced fracture of upper end of left humerus, initial encounter for closed fracture: Secondary | ICD-10-CM | POA: Diagnosis not present

## 2020-07-26 DIAGNOSIS — R69 Illness, unspecified: Secondary | ICD-10-CM | POA: Diagnosis not present

## 2020-07-26 DIAGNOSIS — Z4789 Encounter for other orthopedic aftercare: Secondary | ICD-10-CM | POA: Diagnosis not present

## 2020-07-26 DIAGNOSIS — M109 Gout, unspecified: Secondary | ICD-10-CM | POA: Diagnosis present

## 2020-07-26 DIAGNOSIS — S42202A Unspecified fracture of upper end of left humerus, initial encounter for closed fracture: Secondary | ICD-10-CM | POA: Diagnosis not present

## 2020-07-26 DIAGNOSIS — S52121D Displaced fracture of head of right radius, subsequent encounter for closed fracture with routine healing: Secondary | ICD-10-CM | POA: Diagnosis not present

## 2020-07-26 DIAGNOSIS — J449 Chronic obstructive pulmonary disease, unspecified: Secondary | ICD-10-CM | POA: Diagnosis not present

## 2020-07-26 DIAGNOSIS — M25521 Pain in right elbow: Secondary | ICD-10-CM | POA: Diagnosis present

## 2020-07-26 HISTORY — PX: ORIF ELBOW FRACTURE: SHX5031

## 2020-07-26 LAB — COMPREHENSIVE METABOLIC PANEL
ALT: 12 U/L (ref 0–44)
AST: 22 U/L (ref 15–41)
Albumin: 3.2 g/dL — ABNORMAL LOW (ref 3.5–5.0)
Alkaline Phosphatase: 41 U/L (ref 38–126)
Anion gap: 9 (ref 5–15)
BUN: 10 mg/dL (ref 8–23)
CO2: 23 mmol/L (ref 22–32)
Calcium: 8.5 mg/dL — ABNORMAL LOW (ref 8.9–10.3)
Chloride: 104 mmol/L (ref 98–111)
Creatinine, Ser: 0.65 mg/dL (ref 0.44–1.00)
GFR, Estimated: >60 mL/min (ref >60)
Glucose, Bld: 116 mg/dL — ABNORMAL HIGH (ref 70–99)
Potassium: 4 mmol/L (ref 3.5–5.1)
Sodium: 136 mmol/L (ref 135–145)
Total Bilirubin: 1 mg/dL (ref 0.3–1.2)
Total Protein: 5.4 g/dL — ABNORMAL LOW (ref 6.5–8.1)

## 2020-07-26 LAB — CBC WITH DIFFERENTIAL/PLATELET
Abs Immature Granulocytes: 0.02 10*3/uL (ref 0.00–0.07)
Basophils Absolute: 0 10*3/uL (ref 0.0–0.1)
Basophils Relative: 0 %
Eosinophils Absolute: 0.1 10*3/uL (ref 0.0–0.5)
Eosinophils Relative: 1 %
HCT: 36.1 % (ref 36.0–46.0)
Hemoglobin: 11.9 g/dL — ABNORMAL LOW (ref 12.0–15.0)
Immature Granulocytes: 0 %
Lymphocytes Relative: 10 %
Lymphs Abs: 0.9 10*3/uL (ref 0.7–4.0)
MCH: 30.2 pg (ref 26.0–34.0)
MCHC: 33 g/dL (ref 30.0–36.0)
MCV: 91.6 fL (ref 80.0–100.0)
Monocytes Absolute: 0.9 10*3/uL (ref 0.1–1.0)
Monocytes Relative: 10 %
Neutro Abs: 7.1 10*3/uL (ref 1.7–7.7)
Neutrophils Relative %: 79 %
Platelets: 228 10*3/uL (ref 150–400)
RBC: 3.94 MIL/uL (ref 3.87–5.11)
RDW: 15.7 % — ABNORMAL HIGH (ref 11.5–15.5)
WBC: 9 10*3/uL (ref 4.0–10.5)
nRBC: 0 % (ref 0.0–0.2)

## 2020-07-26 LAB — HIV ANTIBODY (ROUTINE TESTING W REFLEX): HIV Screen 4th Generation wRfx: NONREACTIVE

## 2020-07-26 LAB — SURGICAL PCR SCREEN
MRSA, PCR: NEGATIVE
Staphylococcus aureus: NEGATIVE

## 2020-07-26 LAB — ABO/RH: ABO/RH(D): O POS

## 2020-07-26 LAB — TYPE AND SCREEN
ABO/RH(D): O POS
Antibody Screen: NEGATIVE

## 2020-07-26 IMAGING — RF DG ELBOW COMPLETE 3+V*R*
1 series · 7 of 7 positions shown · non-contrast
Comparison: [DATE].

CLINICAL DATA: ORIF elbow

EXAM:
DG C-ARM 1-60 MIN; RIGHT ELBOW - COMPLETE 3+ VIEW
FLUOROSCOPY TIME:  Fluoroscopy Time:  61.2 seconds.

[Series 1: run · 7 of 7 slices shown]
[im 1/7]
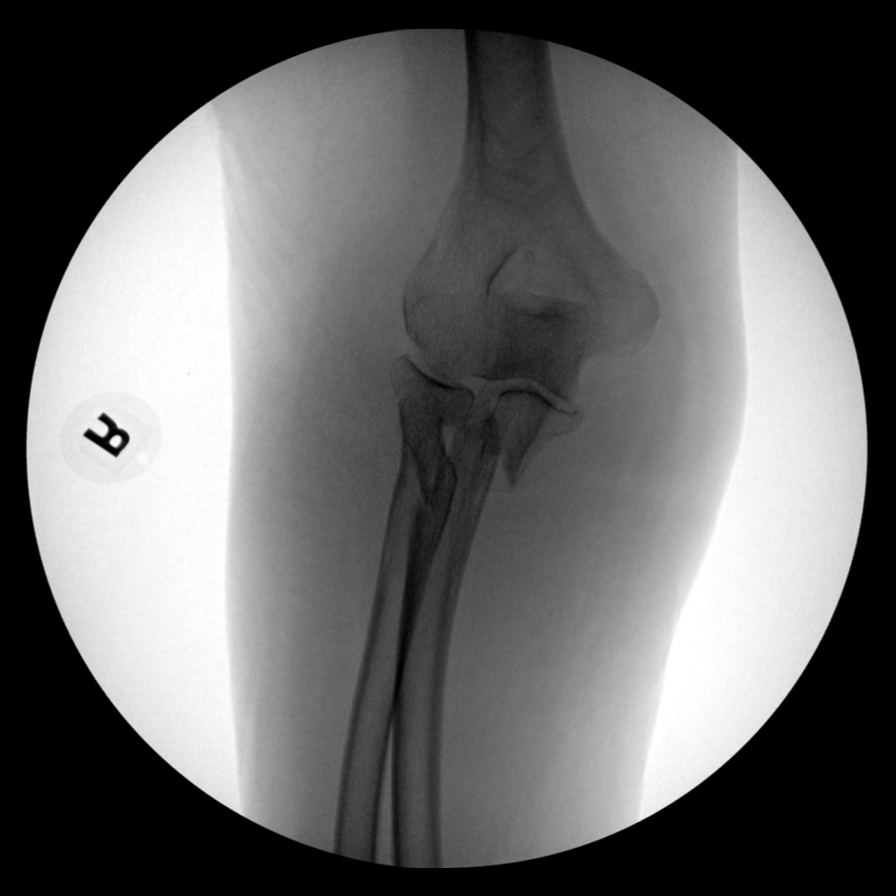
[im 2/7]
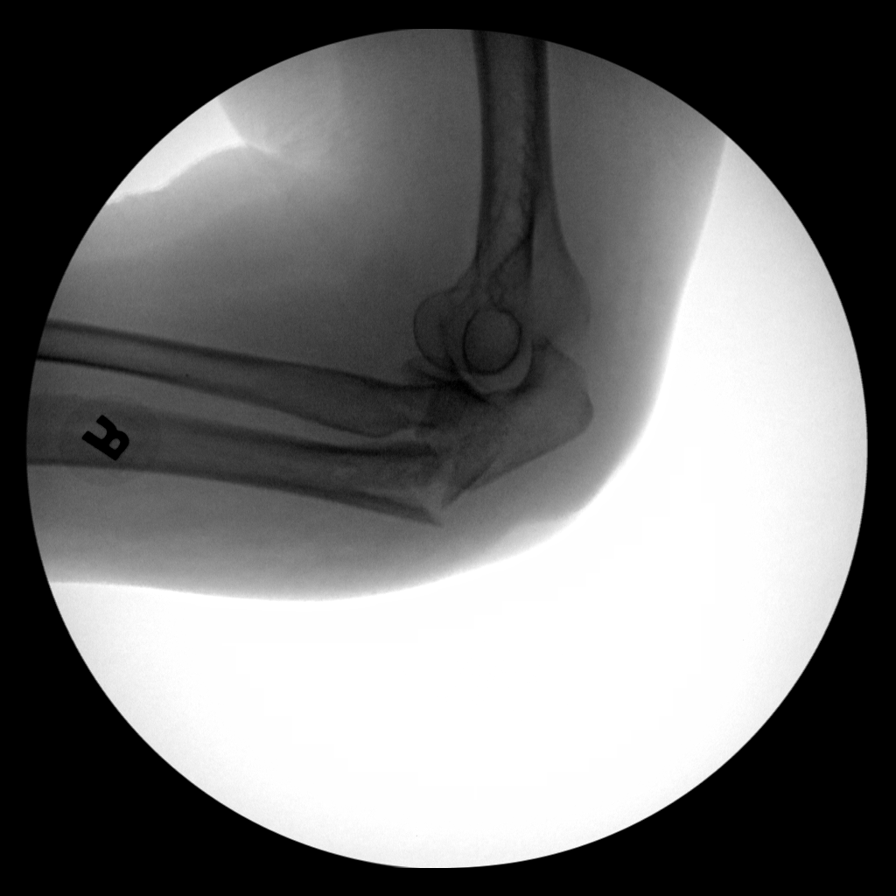
[im 3/7]
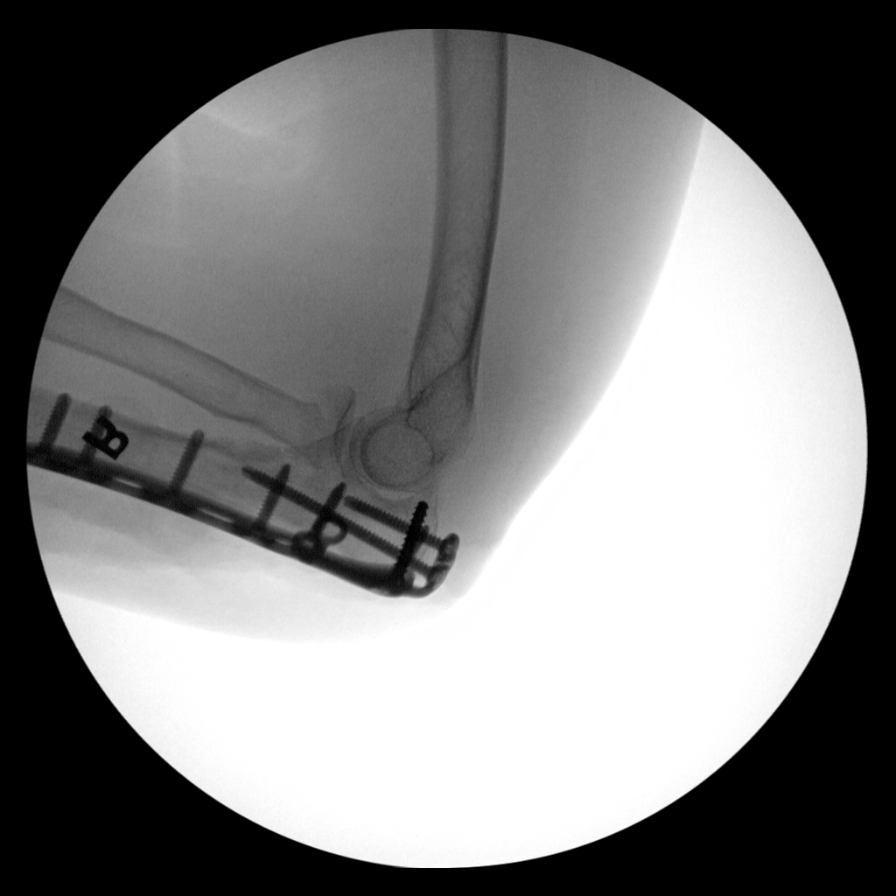
[im 4/7]
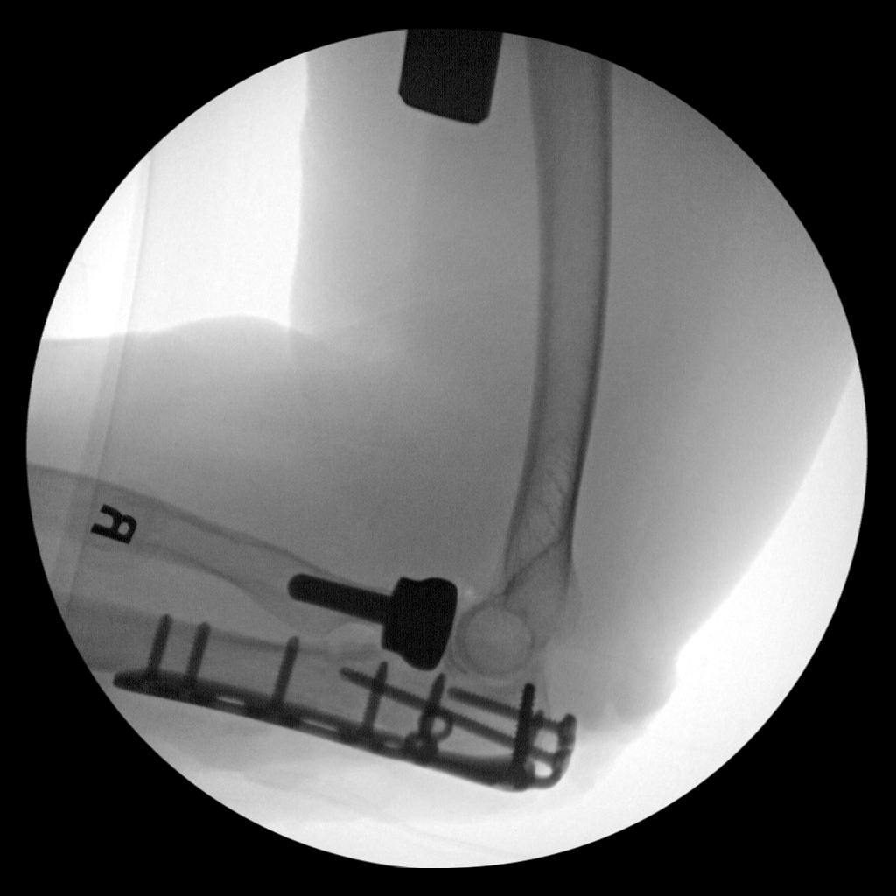
[im 5/7]
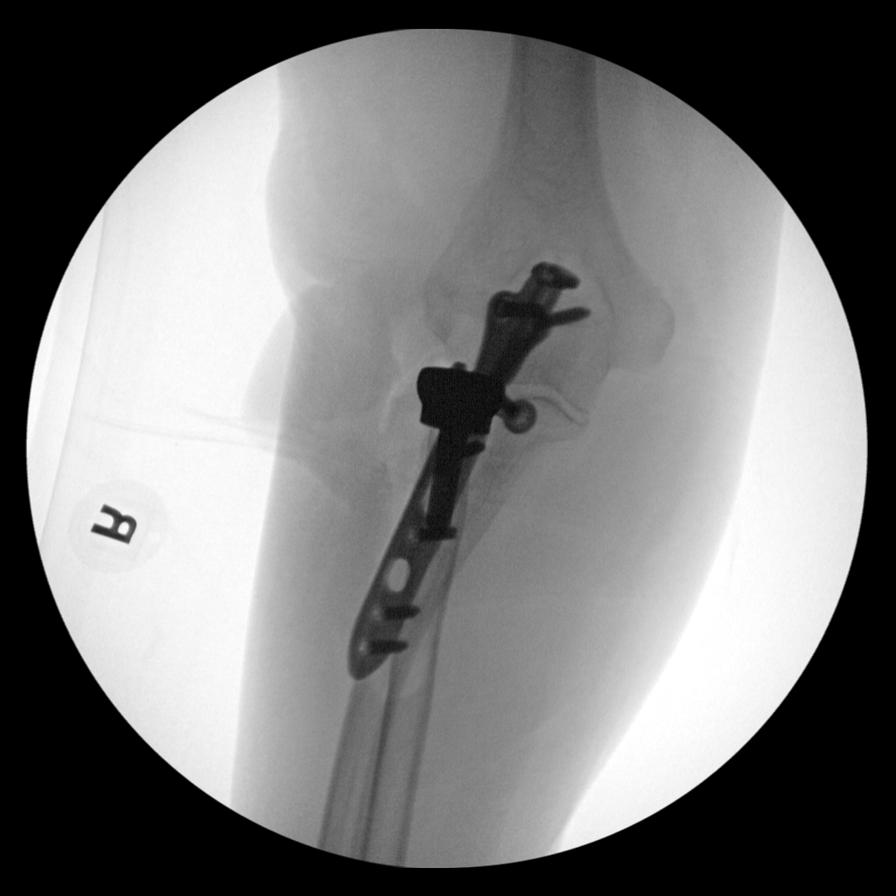
[im 6/7]
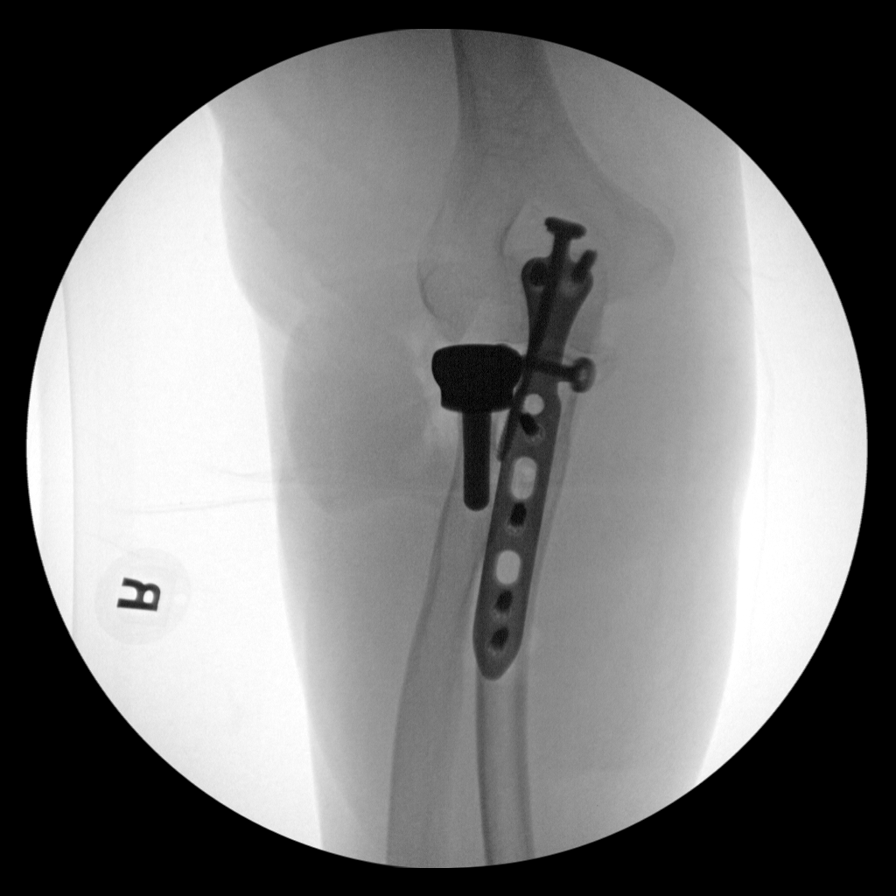
[im 7/7]
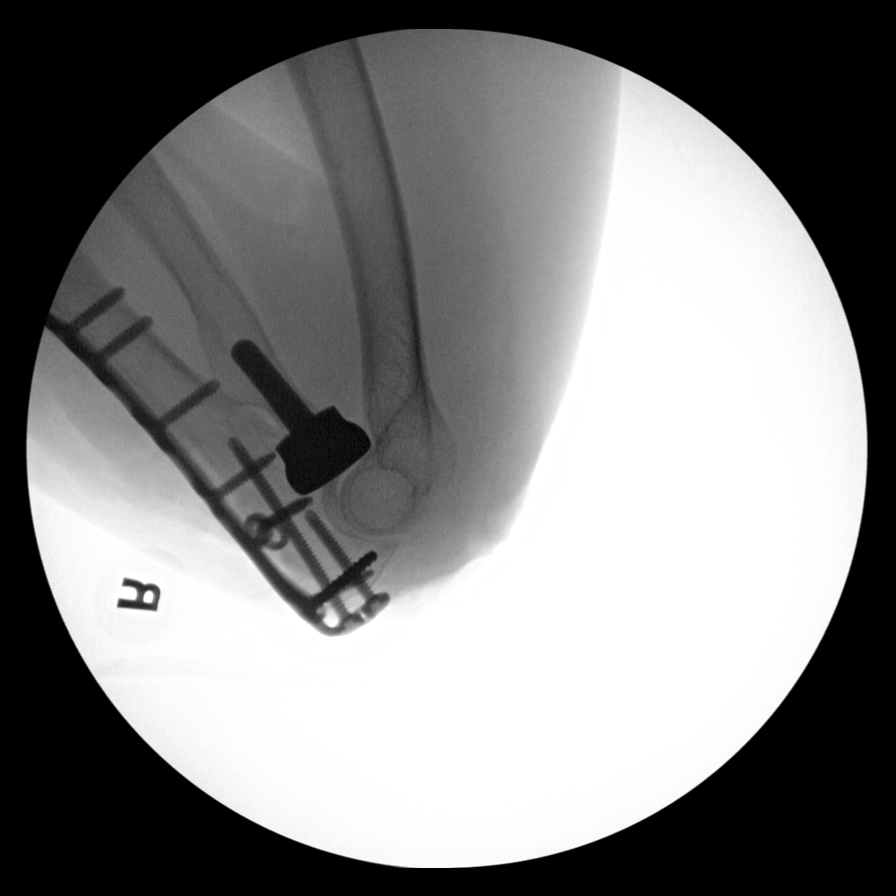

[7 of 7 positions shown; findings below may reference images not displayed]

FINDINGS: Seven C-arm fluoroscopic images were obtained intraoperatively and
submitted for post operative interpretation. These images
demonstrate plate and screw fixation of the proximal ulna and radial
head replacement. Please see the performing provider's procedural
report for further detail.
IMPRESSION: Intraoperative fluoroscopic imaging, as detailed above.

## 2020-07-26 IMAGING — DX DG CHEST 1V PORT
1 series · 1 of 1 positions shown · non-contrast
Comparison: [DATE]

CLINICAL DATA: Left shoulder fracture

EXAM:
PORTABLE CHEST 1 VIEW

[chest ap]
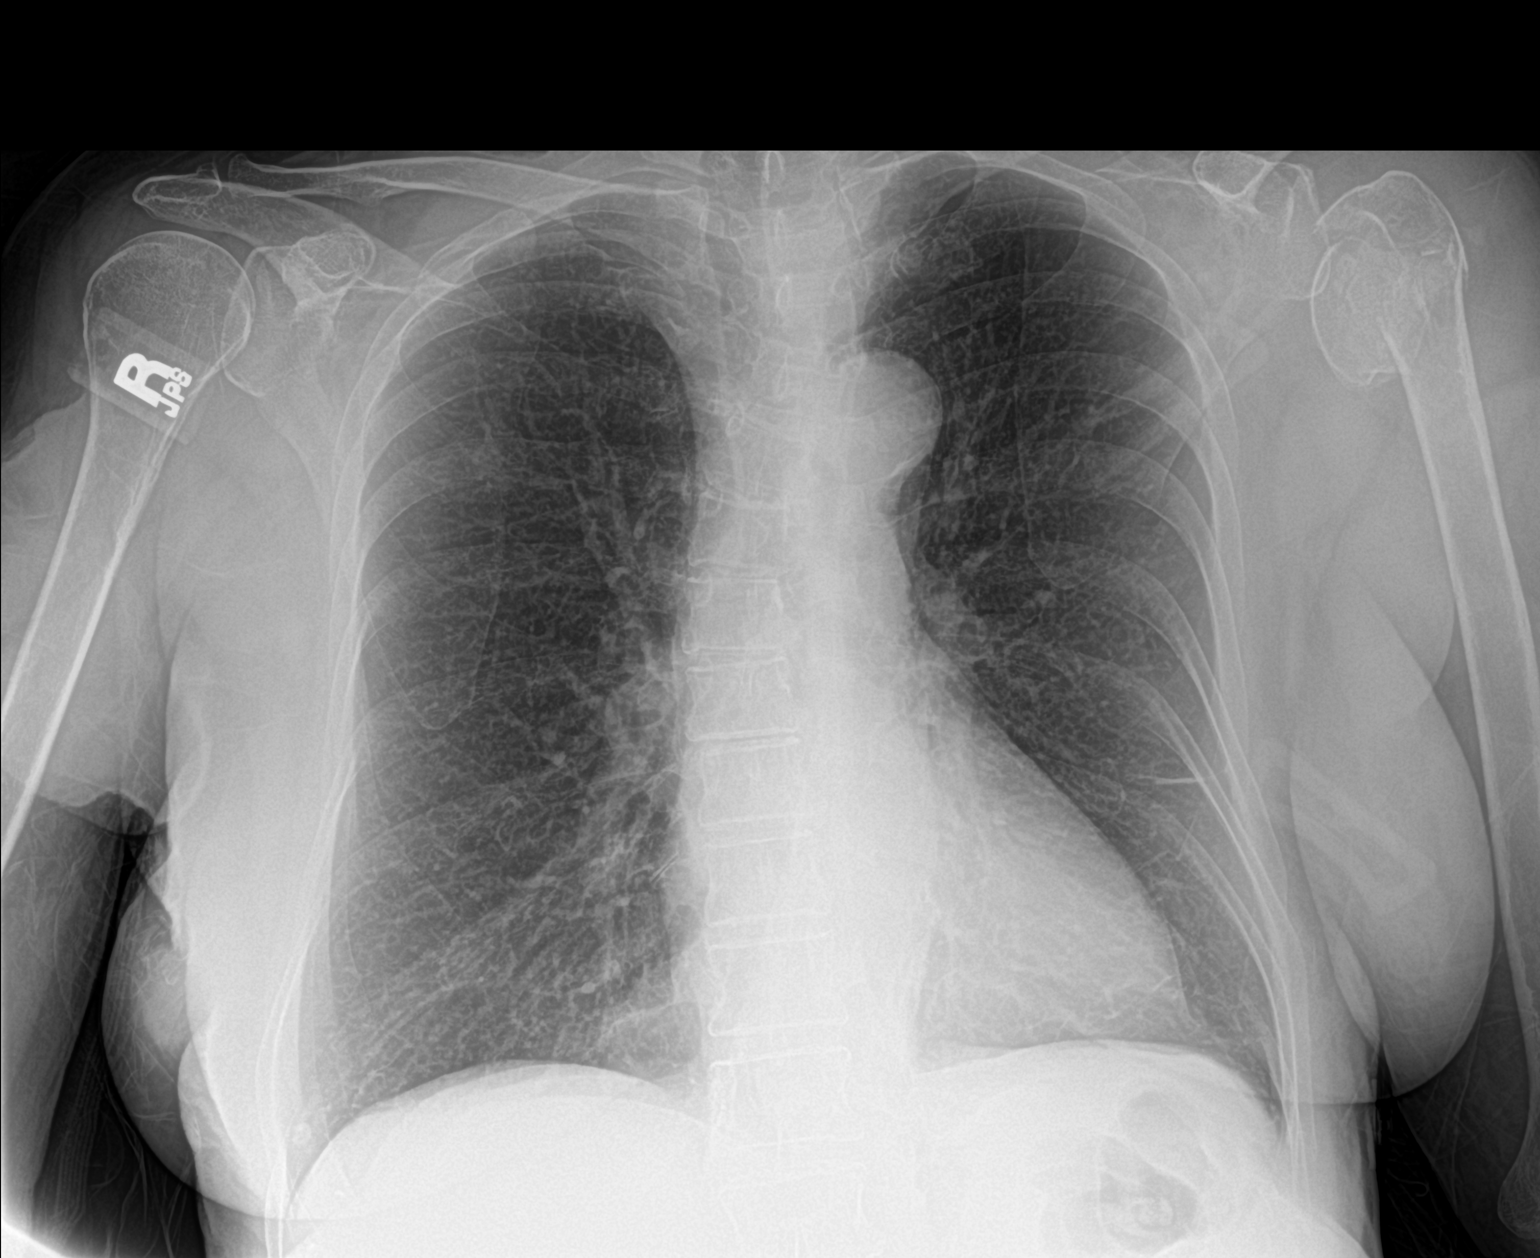

[1 of 1 positions shown; findings below may reference images not displayed]

FINDINGS: The lungs are symmetrically mildly hyperinflated in keeping with
changes of underlying COPD. Minimal linear scarring at the left lung
base. The lungs are otherwise clear. No pneumothorax or pleural
effusion. Cardiac size within normal limits. Pulmonary vascularity
is normal. Mild thoracic 6 point scoliosis is noted. An acute
three-part fracture of the left humeral head is identified with
inferior subluxation of the a articular surface of the humeral head
from the glenoid fossa likely representing pseudosubluxation in the
setting of a left shoulder effusion.
IMPRESSION: COPD.

Acute three-part fracture of the left humeral head with
pseudosubluxation noted.

## 2020-07-26 IMAGING — RF DG C-ARM 1-60 MIN
1 series · 7 of 7 positions shown · non-contrast
Comparison: [DATE].

CLINICAL DATA: ORIF elbow

EXAM:
DG C-ARM 1-60 MIN; RIGHT ELBOW - COMPLETE 3+ VIEW
FLUOROSCOPY TIME:  Fluoroscopy Time:  61.2 seconds.

[Series 1: run · 7 of 7 slices shown]
[im 1/7]
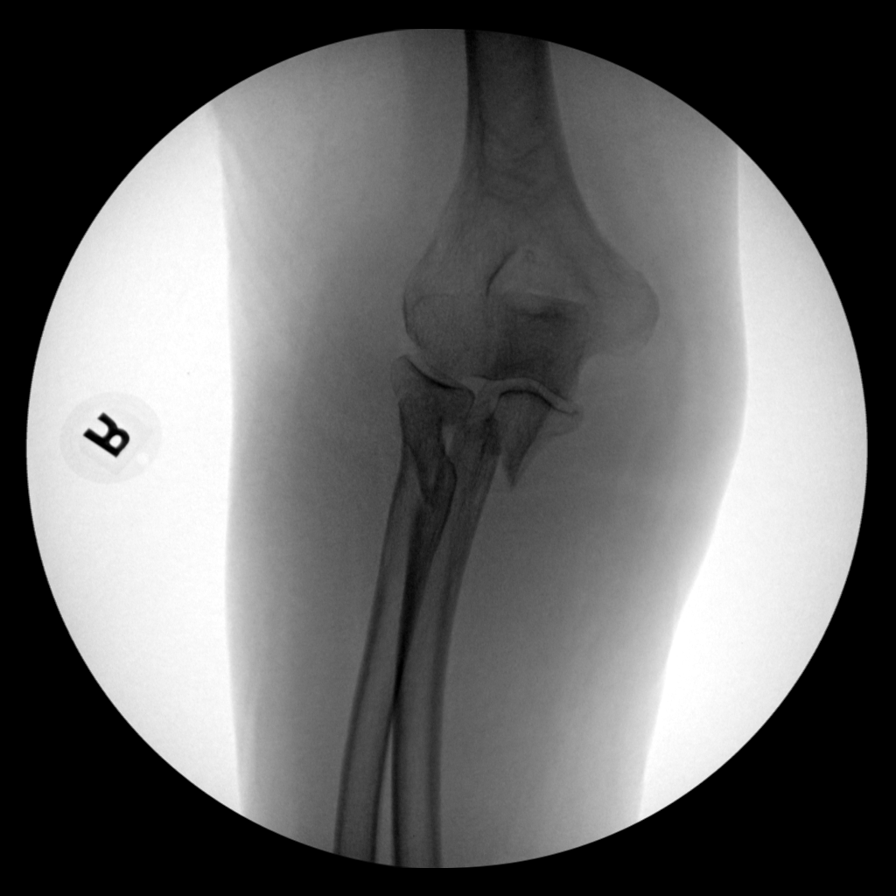
[im 2/7]
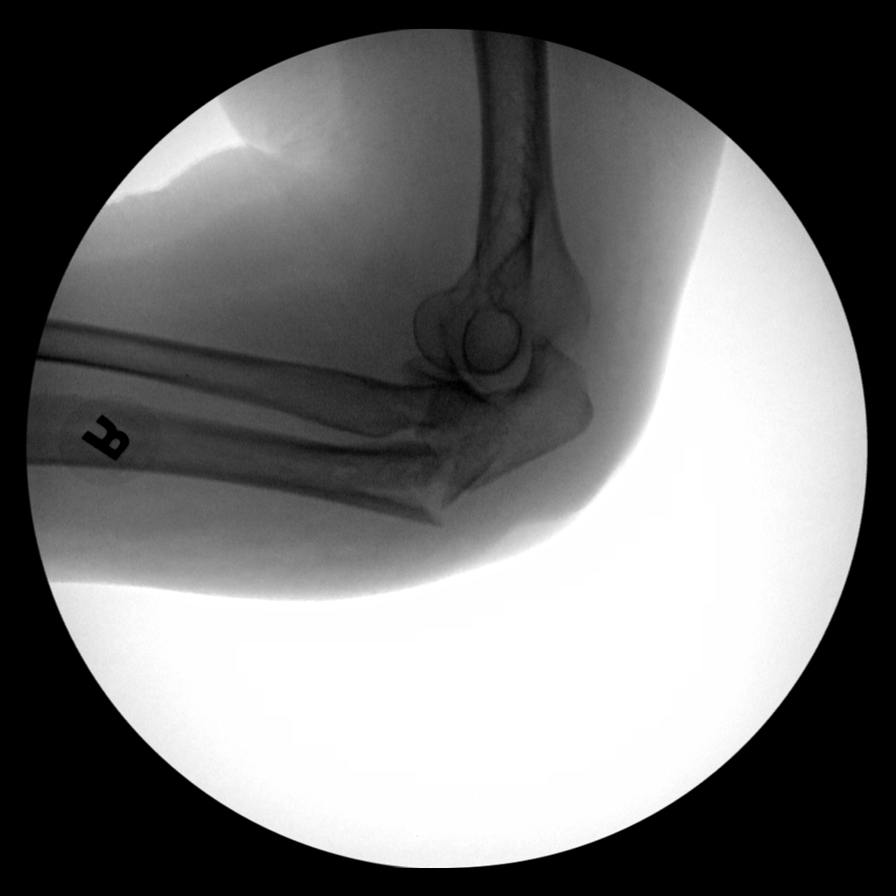
[im 3/7]
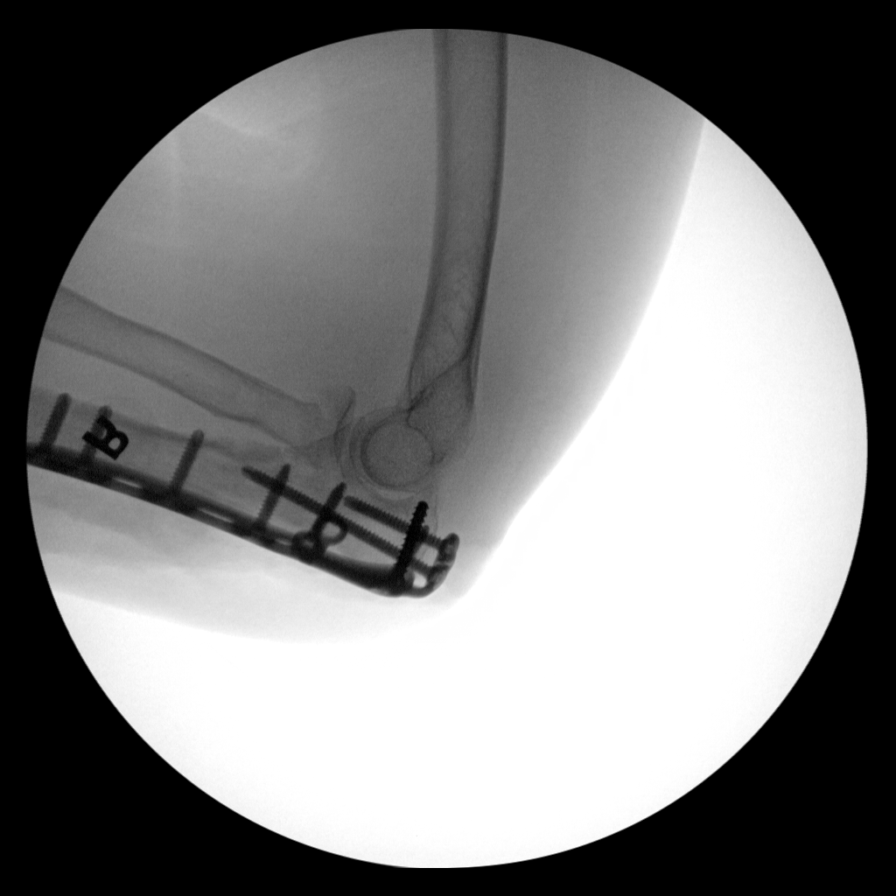
[im 4/7]
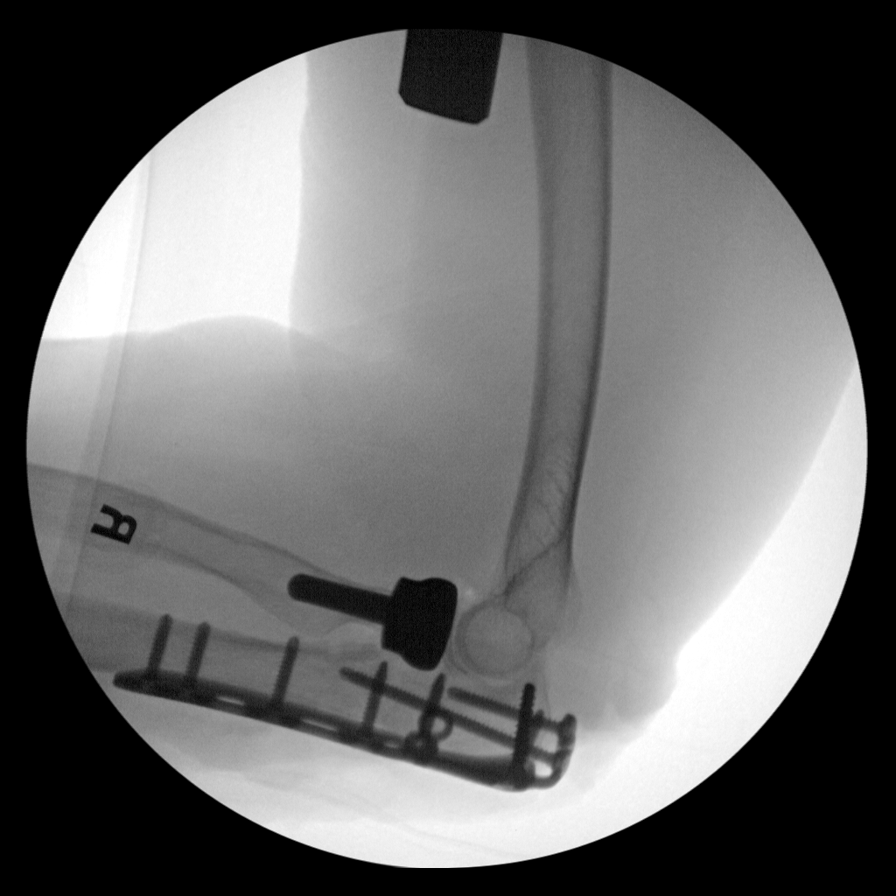
[im 5/7]
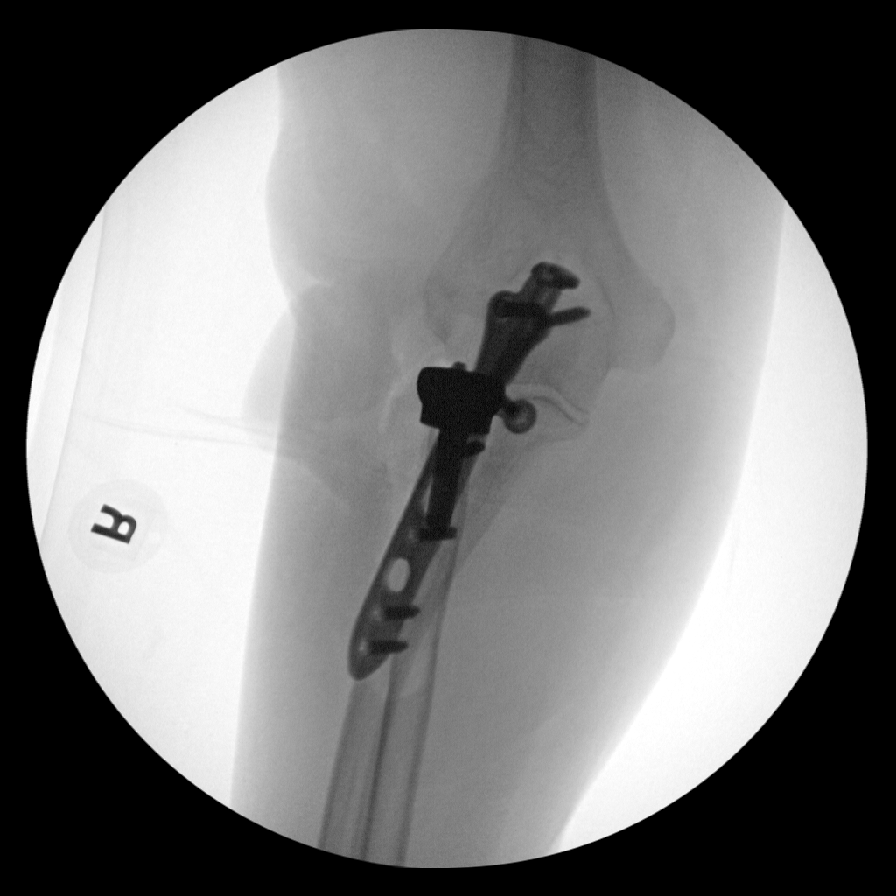
[im 6/7]
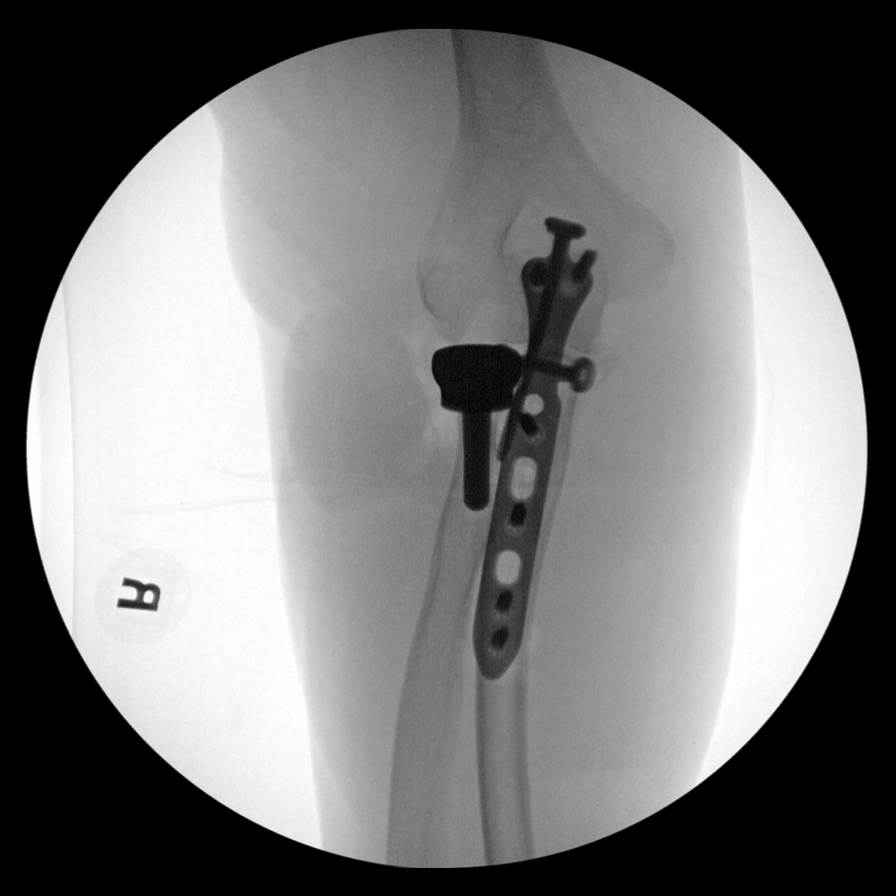
[im 7/7]
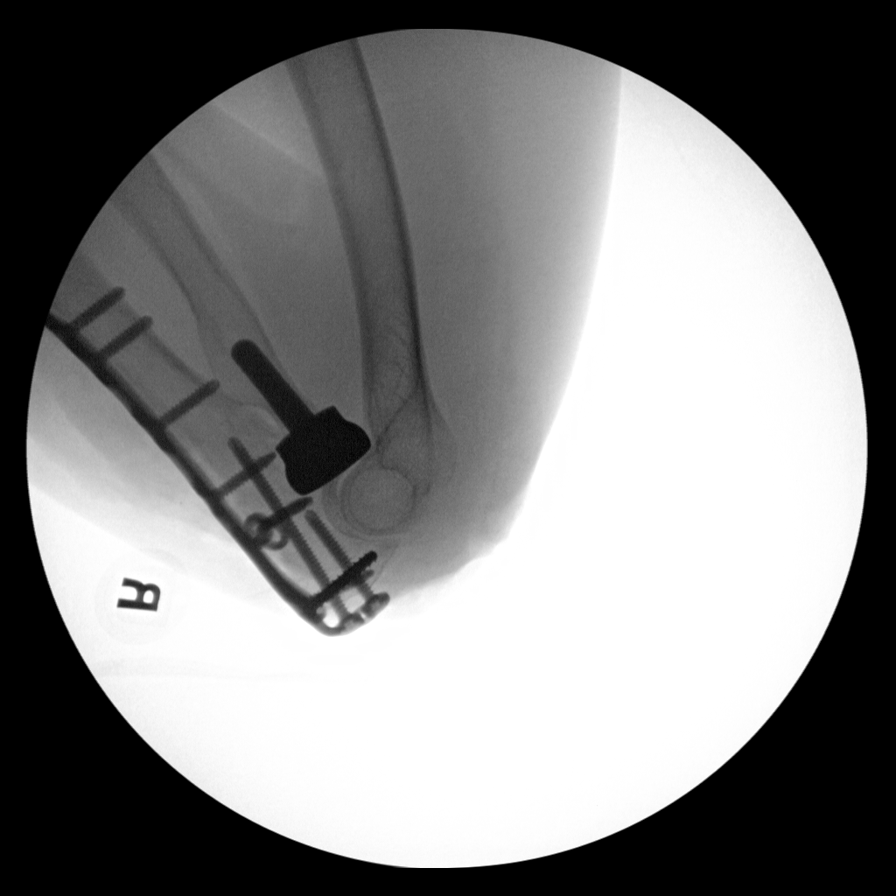

[7 of 7 positions shown; findings below may reference images not displayed]

FINDINGS: Seven C-arm fluoroscopic images were obtained intraoperatively and
submitted for post operative interpretation. These images
demonstrate plate and screw fixation of the proximal ulna and radial
head replacement. Please see the performing provider's procedural
report for further detail.
IMPRESSION: Intraoperative fluoroscopic imaging, as detailed above.

## 2020-07-26 IMAGING — DX DG ELBOW 2V*R*
2 series · 2 of 2 positions shown · non-contrast
Comparison: Fluoroscopic intraoperative study dated [DATE].

CLINICAL DATA: 69-year-old female with ORIF of the right elbow.

EXAM:
RIGHT ELBOW - 2 VIEW

[elbow ap]
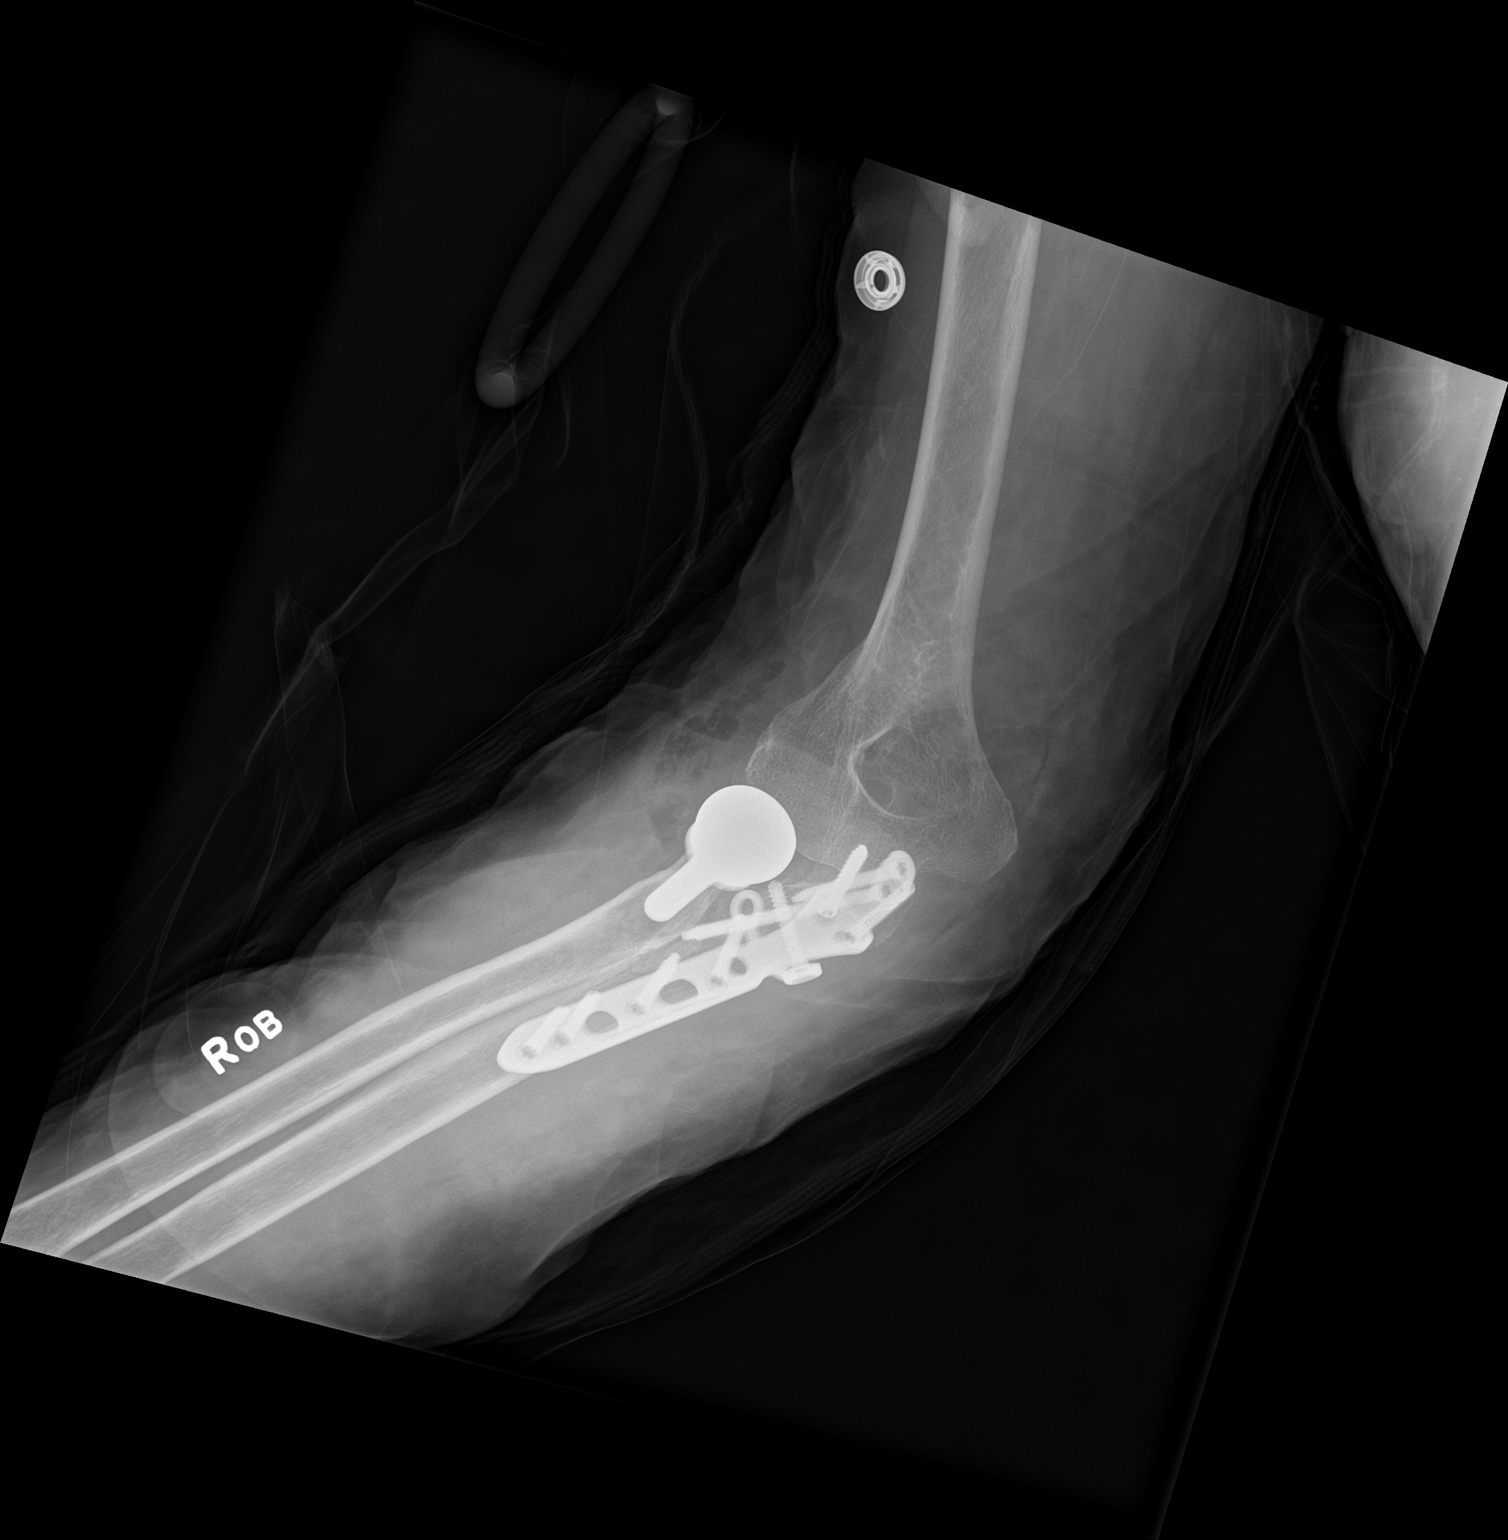

[elbow lat]
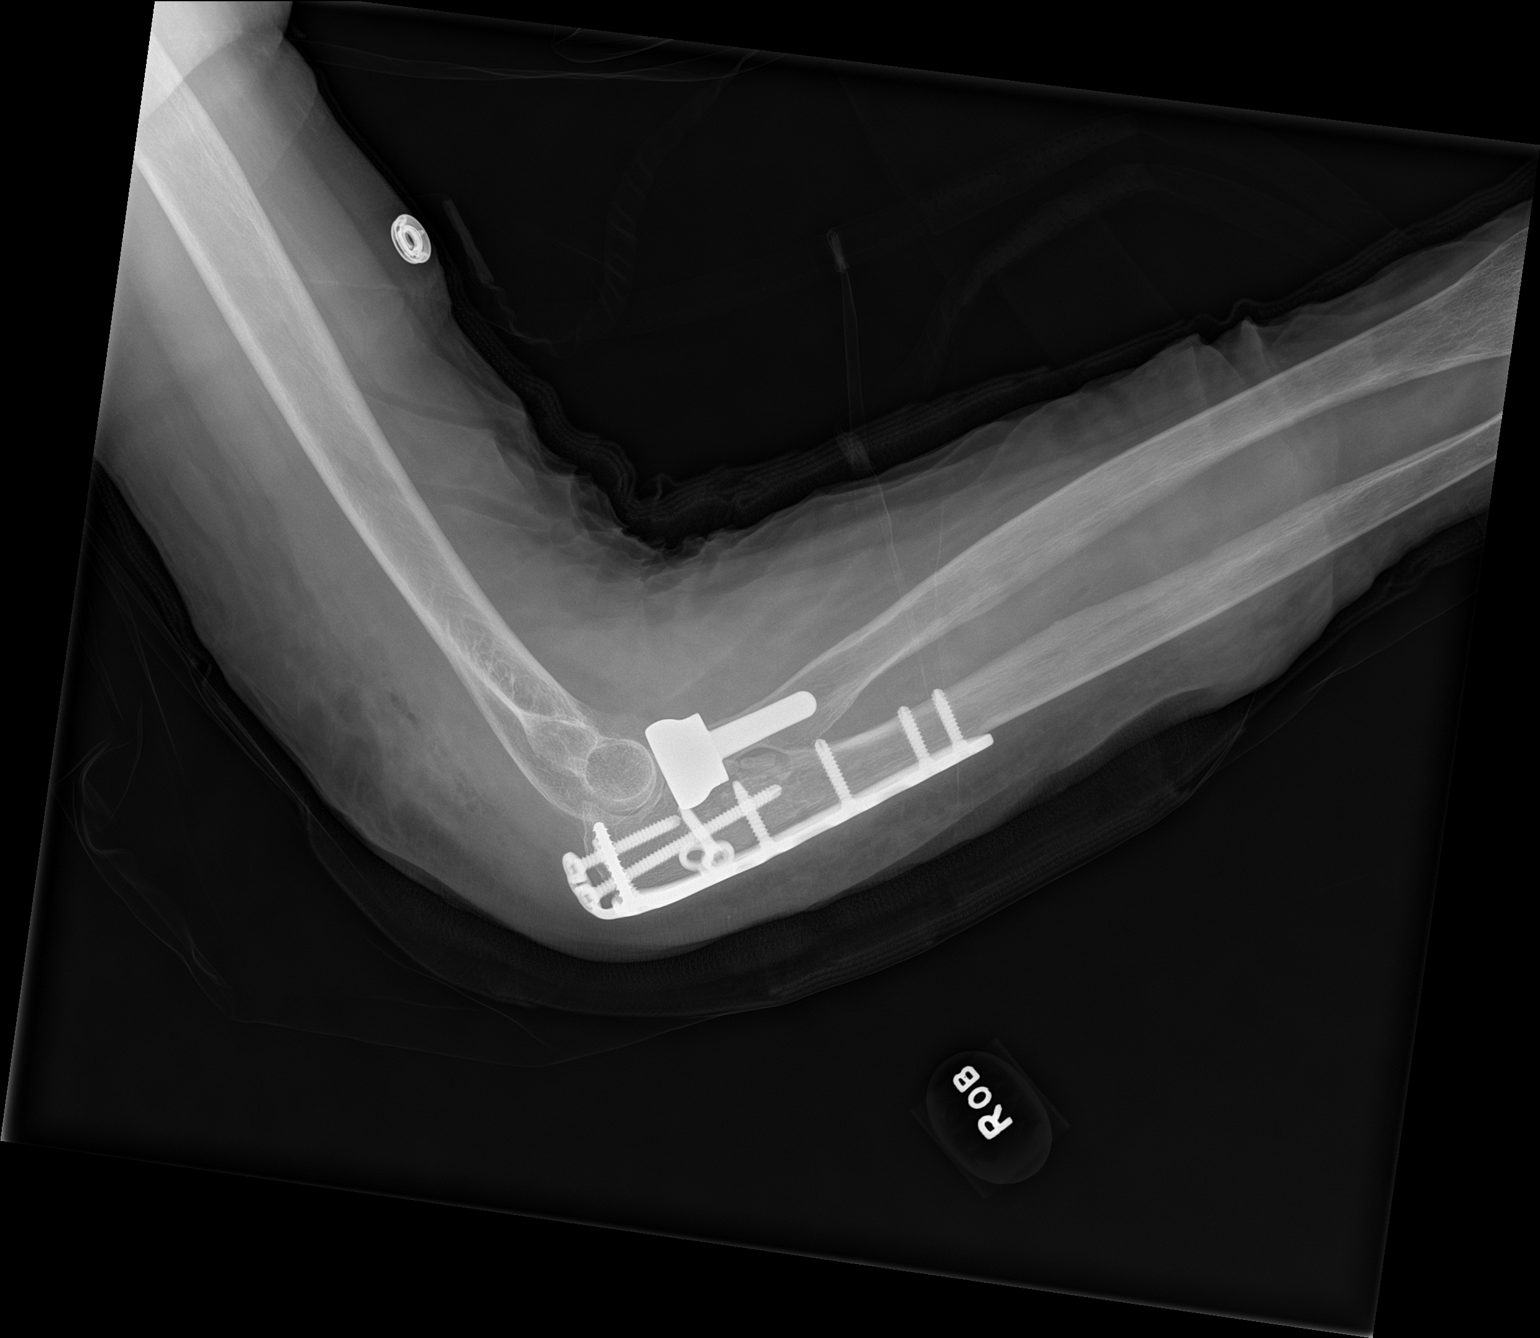

[2 of 2 positions shown; findings below may reference images not displayed]

FINDINGS: There is replacement of the radial head. Fixation plate and screws
noted in the proximal ulna. The bones are osteopenic. There is no
dislocation. Postsurgical changes of the soft tissues.
IMPRESSION: Status post replacement of the radial head and plate and screws of
the proximal ulna.

## 2020-07-26 SURGERY — OPEN REDUCTION INTERNAL FIXATION (ORIF) ELBOW/OLECRANON FRACTURE
Anesthesia: General | Site: Elbow | Laterality: Right

## 2020-07-26 MED ORDER — MORPHINE SULFATE (PF) 4 MG/ML IV SOLN
4.0000 mg | INTRAVENOUS | Status: DC | PRN
  Administered 2020-07-26: 4 mg via INTRAVENOUS
  Filled 2020-07-26: qty 1

## 2020-07-26 MED ORDER — ONDANSETRON HCL 4 MG/2ML IJ SOLN
INTRAMUSCULAR | Status: DC | PRN
  Administered 2020-07-26: 4 mg via INTRAVENOUS

## 2020-07-26 MED ORDER — ONDANSETRON HCL 4 MG PO TABS: 4.0000 mg | ORAL_TABLET | Freq: Four times a day (QID) | ORAL | Status: DC | PRN

## 2020-07-26 MED ORDER — METOPROLOL SUCCINATE ER 25 MG PO TB24
25.0000 mg | ORAL_TABLET | Freq: Every day | ORAL | Status: DC
  Administered 2020-07-26 – 2020-07-28 (×3): 25 mg via ORAL
  Filled 2020-07-26 (×3): qty 1

## 2020-07-26 MED ORDER — PHENYLEPHRINE HCL-NACL 10-0.9 MG/250ML-% IV SOLN
INTRAVENOUS | Status: DC | PRN
  Administered 2020-07-26: 25 ug/min via INTRAVENOUS

## 2020-07-26 MED ORDER — FENTANYL CITRATE (PF) 100 MCG/2ML IJ SOLN: 25.0000 ug | INTRAMUSCULAR | Status: DC | PRN

## 2020-07-26 MED ORDER — METHOCARBAMOL 1000 MG/10ML IJ SOLN
500.0000 mg | Freq: Four times a day (QID) | INTRAVENOUS | Status: DC | PRN
  Filled 2020-07-26: qty 5

## 2020-07-26 MED ORDER — LACTATED RINGERS IV SOLN: INTRAVENOUS | Status: DC | PRN

## 2020-07-26 MED ORDER — METOCLOPRAMIDE HCL 5 MG/ML IJ SOLN: 5.0000 mg | Freq: Three times a day (TID) | INTRAMUSCULAR | Status: DC | PRN

## 2020-07-26 MED ORDER — CEFAZOLIN SODIUM-DEXTROSE 2-3 GM-%(50ML) IV SOLR
INTRAVENOUS | Status: DC | PRN
  Administered 2020-07-26: 2 g via INTRAVENOUS

## 2020-07-26 MED ORDER — PROPOFOL 10 MG/ML IV BOLUS
INTRAVENOUS | Status: DC | PRN
  Administered 2020-07-26: 120 mg via INTRAVENOUS

## 2020-07-26 MED ORDER — ENOXAPARIN SODIUM 40 MG/0.4ML ~~LOC~~ SOLN
40.0000 mg | SUBCUTANEOUS | Status: DC
  Administered 2020-07-27: 40 mg via SUBCUTANEOUS
  Filled 2020-07-26: qty 0.4

## 2020-07-26 MED ORDER — ALBUTEROL SULFATE HFA 108 (90 BASE) MCG/ACT IN AERS: 2.0000 | INHALATION_SPRAY | Freq: Four times a day (QID) | RESPIRATORY_TRACT | Status: DC | PRN

## 2020-07-26 MED ORDER — DOCUSATE SODIUM 100 MG PO CAPS
100.0000 mg | ORAL_CAPSULE | Freq: Two times a day (BID) | ORAL | Status: DC
  Administered 2020-07-27 – 2020-07-28 (×4): 100 mg via ORAL
  Filled 2020-07-26 (×5): qty 1

## 2020-07-26 MED ORDER — CEFAZOLIN SODIUM-DEXTROSE 2-4 GM/100ML-% IV SOLN
2.0000 g | Freq: Three times a day (TID) | INTRAVENOUS | Status: AC
  Administered 2020-07-26 – 2020-07-27 (×3): 2 g via INTRAVENOUS
  Filled 2020-07-26 (×3): qty 100

## 2020-07-26 MED ORDER — PANTOPRAZOLE SODIUM 40 MG PO TBEC
40.0000 mg | DELAYED_RELEASE_TABLET | Freq: Every day | ORAL | Status: DC
  Administered 2020-07-26 – 2020-07-31 (×6): 40 mg via ORAL
  Filled 2020-07-26 (×6): qty 1

## 2020-07-26 MED ORDER — POLYETHYLENE GLYCOL 3350 17 G PO PACK: 17.0000 g | PACK | Freq: Every day | ORAL | Status: DC | PRN

## 2020-07-26 MED ORDER — LIDOCAINE HCL (CARDIAC) PF 100 MG/5ML IV SOSY
PREFILLED_SYRINGE | INTRAVENOUS | Status: DC | PRN
  Administered 2020-07-26: 60 mg via INTRAVENOUS

## 2020-07-26 MED ORDER — 0.9 % SODIUM CHLORIDE (POUR BTL) OPTIME
TOPICAL | Status: DC | PRN
  Administered 2020-07-26: 1000 mL

## 2020-07-26 MED ORDER — ONDANSETRON HCL 4 MG/2ML IJ SOLN: 4.0000 mg | Freq: Once | INTRAMUSCULAR | Status: DC | PRN

## 2020-07-26 MED ORDER — DEXAMETHASONE SODIUM PHOSPHATE 4 MG/ML IJ SOLN
INTRAMUSCULAR | Status: DC | PRN
  Administered 2020-07-26: 5 mg via INTRAVENOUS

## 2020-07-26 MED ORDER — FENTANYL CITRATE (PF) 100 MCG/2ML IJ SOLN
INTRAMUSCULAR | Status: DC | PRN
  Administered 2020-07-26: 100 ug via INTRAVENOUS

## 2020-07-26 MED ORDER — TRAMADOL HCL 50 MG PO TABS
50.0000 mg | ORAL_TABLET | Freq: Four times a day (QID) | ORAL | Status: DC | PRN
  Administered 2020-07-26 (×2): 50 mg via ORAL
  Administered 2020-07-27 (×2): 100 mg via ORAL
  Administered 2020-07-27: 50 mg via ORAL
  Filled 2020-07-26: qty 1
  Filled 2020-07-26 (×2): qty 2
  Filled 2020-07-26 (×2): qty 1

## 2020-07-26 MED ORDER — METHOCARBAMOL 500 MG PO TABS
500.0000 mg | ORAL_TABLET | Freq: Four times a day (QID) | ORAL | Status: DC | PRN
  Administered 2020-07-26 – 2020-07-31 (×9): 500 mg via ORAL
  Filled 2020-07-26 (×10): qty 1

## 2020-07-26 MED ORDER — ONDANSETRON HCL 4 MG/2ML IJ SOLN: 4.0000 mg | Freq: Four times a day (QID) | INTRAMUSCULAR | Status: DC | PRN

## 2020-07-26 MED ORDER — SODIUM CHLORIDE 0.9 % IV SOLN: Freq: Once | INTRAVENOUS | Status: AC

## 2020-07-26 MED ORDER — ROCURONIUM BROMIDE 100 MG/10ML IV SOLN
INTRAVENOUS | Status: DC | PRN
  Administered 2020-07-26: 50 mg via INTRAVENOUS

## 2020-07-26 MED ORDER — GABAPENTIN 100 MG PO CAPS
100.0000 mg | ORAL_CAPSULE | Freq: Three times a day (TID) | ORAL | Status: DC
  Administered 2020-07-26 – 2020-07-31 (×15): 100 mg via ORAL
  Filled 2020-07-26 (×15): qty 1

## 2020-07-26 MED ORDER — SUGAMMADEX SODIUM 200 MG/2ML IV SOLN
INTRAVENOUS | Status: DC | PRN
  Administered 2020-07-26: 125 mg via INTRAVENOUS

## 2020-07-26 MED ORDER — PHENYLEPHRINE HCL (PRESSORS) 10 MG/ML IV SOLN
INTRAVENOUS | Status: DC | PRN
  Administered 2020-07-26 (×2): 120 ug via INTRAVENOUS

## 2020-07-26 MED ORDER — METOCLOPRAMIDE HCL 5 MG PO TABS: 5.0000 mg | ORAL_TABLET | Freq: Three times a day (TID) | ORAL | Status: DC | PRN

## 2020-07-26 MED ORDER — VANCOMYCIN HCL 1000 MG IV SOLR
INTRAVENOUS | Status: DC | PRN
  Administered 2020-07-26: 1000 mg

## 2020-07-26 MED ORDER — POTASSIUM CHLORIDE IN NACL 20-0.9 MEQ/L-% IV SOLN
INTRAVENOUS | Status: DC
  Filled 2020-07-26: qty 1000

## 2020-07-26 SURGICAL SUPPLY — 96 items
BENZOIN TINCTURE PRP APPL 2/3 (GAUZE/BANDAGES/DRESSINGS) IMPLANT
BIT DRILL CALIBRATED 2.7 (BIT) ×2 IMPLANT
BLADE AVERAGE 25X9 (BLADE) ×2 IMPLANT
BLADE CLIPPER SURG (BLADE) ×2 IMPLANT
BLADE SURG 10 STRL SS (BLADE) IMPLANT
BNDG COHESIVE 4X5 TAN STRL (GAUZE/BANDAGES/DRESSINGS) ×2 IMPLANT
BNDG ELASTIC 3X5.8 VLCR STR LF (GAUZE/BANDAGES/DRESSINGS) ×2 IMPLANT
BNDG ELASTIC 4X5.8 VLCR STR LF (GAUZE/BANDAGES/DRESSINGS) ×2 IMPLANT
BNDG ELASTIC 6X5.8 VLCR STR LF (GAUZE/BANDAGES/DRESSINGS) IMPLANT
BNDG ESMARK 4X9 LF (GAUZE/BANDAGES/DRESSINGS) IMPLANT
BNDG GAUZE ELAST 4 BULKY (GAUZE/BANDAGES/DRESSINGS) ×2 IMPLANT
BRUSH SCRUB EZ PLAIN DRY (MISCELLANEOUS) ×2 IMPLANT
CHLORAPREP W/TINT 26 (MISCELLANEOUS) ×4 IMPLANT
CLEANER TIP ELECTROSURG 2X2 (MISCELLANEOUS) ×2 IMPLANT
COVER SURGICAL LIGHT HANDLE (MISCELLANEOUS) ×2 IMPLANT
COVER WAND RF STERILE (DRAPES) ×2 IMPLANT
CUFF TOURN SGL QUICK 18X4 (TOURNIQUET CUFF) IMPLANT
CUFF TOURN SGL QUICK 24 (TOURNIQUET CUFF)
CUFF TRNQT CYL 24X4X16.5-23 (TOURNIQUET CUFF) IMPLANT
DECANTER SPIKE VIAL GLASS SM (MISCELLANEOUS) IMPLANT
DERMABOND ADVANCED (GAUZE/BANDAGES/DRESSINGS) ×1
DERMABOND ADVANCED .7 DNX12 (GAUZE/BANDAGES/DRESSINGS) ×1 IMPLANT
DRAPE C-ARM 42X72 X-RAY (DRAPES) ×2 IMPLANT
DRAPE C-ARMOR (DRAPES) ×2 IMPLANT
DRAPE INCISE IOBAN 66X45 STRL (DRAPES) IMPLANT
DRAPE ORTHO SPLIT 77X108 STRL (DRAPES) ×2
DRAPE SURG ORHT 6 SPLT 77X108 (DRAPES) ×2 IMPLANT
DRAPE U-SHAPE 47X51 STRL (DRAPES) ×2 IMPLANT
DRSG ADAPTIC 3X8 NADH LF (GAUZE/BANDAGES/DRESSINGS) IMPLANT
DRSG MEPILEX BORDER 4X8 (GAUZE/BANDAGES/DRESSINGS) ×2 IMPLANT
ELECT REM PT RETURN 9FT ADLT (ELECTROSURGICAL) ×2
ELECTRODE REM PT RTRN 9FT ADLT (ELECTROSURGICAL) ×1 IMPLANT
GAUZE SPONGE 4X4 12PLY STRL (GAUZE/BANDAGES/DRESSINGS) ×2 IMPLANT
GAUZE XEROFORM 5X9 LF (GAUZE/BANDAGES/DRESSINGS) ×2 IMPLANT
GLOVE BIO SURGEON STRL SZ 6.5 (GLOVE) ×6 IMPLANT
GLOVE BIO SURGEON STRL SZ7.5 (GLOVE) ×8 IMPLANT
GLOVE BIOGEL PI IND STRL 6.5 (GLOVE) ×1 IMPLANT
GLOVE BIOGEL PI IND STRL 7.5 (GLOVE) ×1 IMPLANT
GLOVE BIOGEL PI INDICATOR 6.5 (GLOVE) ×1
GLOVE BIOGEL PI INDICATOR 7.5 (GLOVE) ×1
GOWN STRL REUS W/ TWL LRG LVL3 (GOWN DISPOSABLE) ×2 IMPLANT
GOWN STRL REUS W/TWL LRG LVL3 (GOWN DISPOSABLE) ×2
HEAD RADIAL 10X20 (Head) ×2 IMPLANT
K-WIRE ACE 1.6X6 (WIRE) ×4
K-WIRE FIXATION 2.0X6 (WIRE) ×2
KIT BASIN OR (CUSTOM PROCEDURE TRAY) ×2 IMPLANT
KIT TURNOVER KIT B (KITS) ×2 IMPLANT
KWIRE ACE 1.6X6 (WIRE) ×2 IMPLANT
KWIRE FIXATION 2.0X6 (WIRE) ×1 IMPLANT
MANIFOLD NEPTUNE II (INSTRUMENTS) ×2 IMPLANT
NS IRRIG 1000ML POUR BTL (IV SOLUTION) ×2 IMPLANT
PACK ORTHO EXTREMITY (CUSTOM PROCEDURE TRAY) ×2 IMPLANT
PAD ABD 8X10 STRL (GAUZE/BANDAGES/DRESSINGS) ×2 IMPLANT
PAD ARMBOARD 7.5X6 YLW CONV (MISCELLANEOUS) ×2 IMPLANT
PAD CAST 3X4 CTTN HI CHSV (CAST SUPPLIES) ×2 IMPLANT
PAD CAST 4YDX4 CTTN HI CHSV (CAST SUPPLIES) ×1 IMPLANT
PADDING CAST COTTON 3X4 STRL (CAST SUPPLIES) ×2
PADDING CAST COTTON 4X4 STRL (CAST SUPPLIES) ×1
PLATE OLECRANON LRG (Plate) ×2 IMPLANT
SCREW CORT LP T15 3.5X16 (Screw) ×2 IMPLANT
SCREW CORTICAL 3.5MM  20MM (Screw) ×1 IMPLANT
SCREW CORTICAL 3.5MM 20MM (Screw) ×1 IMPLANT
SCREW LOCK CORT STAR 3.5X14 (Screw) ×4 IMPLANT
SCREW LOCK CORT STAR 3.5X16 (Screw) ×4 IMPLANT
SCREW LOCK CORT STAR 3.5X18 (Screw) ×4 IMPLANT
SCREW LOCK CORT STAR 3.5X26 (Screw) ×2 IMPLANT
SCREW LOCK CORT STAR 3.5X50 (Screw) ×2 IMPLANT
SCREW LP NL T15 3.5X20 (Screw) ×2 IMPLANT
SCREW MDS ST STRM 3.5X18 (Screw) ×2 IMPLANT
SCREW T15 MD 3.5X18 (Screw) ×2 IMPLANT
SCREW T15 MD 3.5X22MM NS (Screw) ×2 IMPLANT
SPLINT PLASTER EXTRA FAST 3X15 (CAST SUPPLIES) ×1
SPLINT PLASTER GYPS XFAST 3X15 (CAST SUPPLIES) ×1 IMPLANT
SPONGE LAP 18X18 RF (DISPOSABLE) ×4 IMPLANT
STAPLER VISISTAT 35W (STAPLE) ×2 IMPLANT
STEM IMPLANT W SCREW (Stem) ×2 IMPLANT
STRIP CLOSURE SKIN 1/2X4 (GAUZE/BANDAGES/DRESSINGS) IMPLANT
SUCTION FRAZIER HANDLE 10FR (MISCELLANEOUS) ×1
SUCTION TUBE FRAZIER 10FR DISP (MISCELLANEOUS) ×1 IMPLANT
SUT MNCRL AB 3-0 PS2 18 (SUTURE) ×2 IMPLANT
SUT MON AB 2-0 CT1 36 (SUTURE) IMPLANT
SUT PDS AB 2-0 CT1 27 (SUTURE) IMPLANT
SUT PROLENE 0 CT (SUTURE) IMPLANT
SUT PROLENE 3 0 PS 2 (SUTURE) IMPLANT
SUT VIC AB 0 CT1 27 (SUTURE) ×2
SUT VIC AB 0 CT1 27XBRD ANBCTR (SUTURE) ×2 IMPLANT
SUT VIC AB 2-0 CT1 27 (SUTURE) ×2
SUT VIC AB 2-0 CT1 TAPERPNT 27 (SUTURE) ×2 IMPLANT
SUT VIC AB 2-0 CT3 27 (SUTURE) IMPLANT
SYR CONTROL 10ML LL (SYRINGE) ×2 IMPLANT
TOWEL GREEN STERILE (TOWEL DISPOSABLE) ×4 IMPLANT
TOWEL GREEN STERILE FF (TOWEL DISPOSABLE) IMPLANT
TUBE CONNECTING 12X1/4 (SUCTIONS) ×2 IMPLANT
UNDERPAD 30X36 HEAVY ABSORB (UNDERPADS AND DIAPERS) ×2 IMPLANT
WATER STERILE IRR 1000ML POUR (IV SOLUTION) ×2 IMPLANT
YANKAUER SUCT BULB TIP NO VENT (SUCTIONS) ×2 IMPLANT

## 2020-07-26 NOTE — ED Notes (Signed)
Transfer consent form signed by this pt at this time with this RN as witness.

## 2020-07-26 NOTE — Anesthesia Postprocedure Evaluation (Signed)
Anesthesia Post Note  Patient: Kathleen Blankenship  Procedure(s) Performed: OPEN REDUCTION INTERNAL FIXATION (ORIF) ELBOW/OLECRANON FRACTURE (Right Elbow)     Patient location during evaluation: Nursing Unit Anesthesia Type: General Level of consciousness: awake and alert Pain management: pain level controlled Vital Signs Assessment: post-procedure vital signs reviewed and stable Respiratory status: spontaneous breathing, nonlabored ventilation, respiratory function stable and patient connected to nasal cannula oxygen Cardiovascular status: blood pressure returned to baseline and stable Postop Assessment: no apparent nausea or vomiting Anesthetic complications: no   No complications documented.  Last Vitals:  Vitals:   07/26/20 1410 07/26/20 1510  BP: (!) 146/94 (!) 143/82  Pulse: 94 91  Resp: 17 16  Temp: 36.9 C 36.8 C  SpO2: 93% 95%    Last Pain:  Vitals:   07/26/20 1510  TempSrc: Oral  PainSc:                  Aslyn Cottman COKER

## 2020-07-26 NOTE — Plan of Care (Signed)
  Problem: Education: Goal: Knowledge of General Education information will improve Description: Including pain rating scale, medication(s)/side effects and non-pharmacologic comfort measures Outcome: Progressing   Problem: Clinical Measurements: Goal: Will remain free from infection Outcome: Progressing   Problem: Activity: Goal: Risk for activity intolerance will decrease Outcome: Progressing   Problem: Nutrition: Goal: Adequate nutrition will be maintained Outcome: Progressing   Problem: Coping: Goal: Level of anxiety will decrease Outcome: Progressing   Problem: Elimination: Goal: Will not experience complications related to bowel motility Outcome: Progressing   Problem: Pain Managment: Goal: General experience of comfort will improve Outcome: Progressing   Problem: Safety: Goal: Ability to remain free from injury will improve Outcome: Progressing   Problem: Skin Integrity: Goal: Risk for impaired skin integrity will decrease Outcome: Progressing   

## 2020-07-26 NOTE — Progress Notes (Signed)
Pt seen in room from PACU, s/p ORIF right elbow with compression wrap CDI and sling. Pt alert/oriented in no acute distress. No complaints at this time.

## 2020-07-26 NOTE — Progress Notes (Signed)
PROGRESS NOTE    Kathleen Blankenship  ZOX:[id number] DOB: 1950/11/03 DOA: 07/26/2020 PCP: Tracie Harrier, MD    Brief Narrative:  Mrs. Kathleen Blankenship was admitted to the hospital with the working diagnosis of closed right elbow fracture, and left proximal humerus fracture.   69 year old female with past medical history of COPD, rheumatoid arthritis, hypertension, hypothyroidism and depression who sustained a mechanical fall, she tripped and fell, landing on her elbows and shoulders.  No head trauma loss of consciousness.  On her initial physical examination blood pressure 140/90, heart rate 90, respiratory rate 18, oxygen saturation 94%, her lungs are clear to auscultation bilaterally, heart S1-S2, present rhythmic, soft abdomen, no lower extremity edema.  Radiograph of right elbow with acute, angulated fractures involving the proximal radius and ulna, without definite intra-articular extension. Radiograph left shoulder with acute comminuted displaced fracture involving both the anatomic and surgical necks of the proximal humerus with extension to the glenohumeral joint.  She was initially evaluated at [facility], transferred to [facility] for further advanced orthopedic intervention. She underwent open reduction internal fixation right elbow with radial head replacement on 12/01.  Assessment & Plan:   Principal Problem:   Closed fracture dislocation of right elbow joint, initial encounter Active Problems:   CAD (coronary artery disease)   Rheumatoid arthritis (HCC)   Closed fracture dislocation of right elbow   Closed fracture of left proximal humerus   1. Right elbow fracture (proximal radius and ulna)/ left shoulder fracture (proximal humerus). Patient had open reduction and internal fixation of right elbow, with plan for surgical intervention of left shoulder later this week.  Continue pain control (acetaminophen/ fentanyl), DVT prophylaxis and follow with  orthopedic surgery recommendations.    2. HTN/ dyslipidemia. Stable blood pressure 143/82 today, will continue metoprolol and will resume losartan. Continue with statin therapy.   3. RA. Continue pain control with tramadol, mobility as tolerated.   4. COPD No clinical signs of exacerbation.   5. Hypothyroid. Patient not on levothyroxine   Status is: Inpatient  Remains inpatient appropriate because:Inpatient level of care appropriate due to severity of illness   Dispo: The patient is from: Home              Anticipated d/c is to: SNF              Anticipated d/c date is: > 3 days              Patient currently is not medically stable to d/c.   DVT prophylaxis: Enoxaparin   Code Status:   full  Family Communication:  No family at the bedside       Consultants:   Orthopedics   Procedures:   right elbow ORIF   Antimicrobials:    ceftriaxone    Subjective: Patient with right elbow and left shoulder controlled with analgesics, no nausea or vomiting, no chest pain or dyspnea.   Objective: Vitals:   07/26/20 1154 07/26/20 1218 07/26/20 1309 07/26/20 1410  BP: (!) 167/83 (!) 158/80 129/76 (!) 146/94  Pulse: 93 97 98 94  Resp: (!) 21 18 17 17   Temp:  98.1 F (36.7 C) 98 F (36.7 C) 98.5 F (36.9 C)  TempSrc:  Oral Oral Oral  SpO2: 93% 94% 92% 93%  Weight:      Height:        Intake/Output Summary (Last 24 hours) at 07/26/2020 1515 Last data filed at 07/26/2020 1130 Gross per 24 hour  Intake 1300  ml  Output 300 ml  Net 1000 ml   Filed Weights   07/26/20 0400  Weight: 57.6 kg    Examination:   General: Not in pain or dyspnea, deconditioned  Neurology: Awake and alert, non focal  E ENT: no pallor, no icterus, oral mucosa moist Cardiovascular: No JVD. S1-S2 present, rhythmic, no gallops, rubs, or murmurs. No lower extremity edema. Pulmonary: positive breath sounds bilaterally, adequate air movement, no wheezing, rhonchi or rales. Gastrointestinal.  Abdomen soft and non-tender Skin. Right arm with ecchymosis  Musculoskeletal: right elbow in sling.      Data Reviewed: I have personally reviewed following labs and imaging studies  CBC: Recent Labs  Lab 07/25/20 1417 07/26/20 0556  WBC 12.9* 9.0  NEUTROABS 10.8* 7.1  HGB 13.7 11.9*  HCT 40.6 36.1  MCV 91.6 91.6  PLT 245 712   Basic Metabolic Panel: Recent Labs  Lab 07/25/20 1417 07/26/20 0556  NA 137 136  K 5.0 4.0  CL 105 104  CO2 21* 23  GLUCOSE 101* 116*  BUN 9 10  CREATININE 0.63 0.65  CALCIUM 8.9 8.5*   GFR: Estimated Creatinine Clearance: 55.7 mL/min (by C-G formula based on SCr of 0.65 mg/dL). Liver Function Tests: Recent Labs  Lab 07/26/20 0556  AST 22  ALT 12  ALKPHOS 41  BILITOT 1.0  PROT 5.4*  ALBUMIN 3.2*   No results for input(s): LIPASE, AMYLASE in the last 168 hours. No results for input(s): AMMONIA in the last 168 hours. Coagulation Profile: No results for input(s): INR, PROTIME in the last 168 hours. Cardiac Enzymes: No results for input(s): CKTOTAL, CKMB, CKMBINDEX, TROPONINI in the last 168 hours. BNP (last 3 results) No results for input(s): PROBNP in the last 8760 hours. HbA1C: No results for input(s): HGBA1C in the last 72 hours. CBG: No results for input(s): GLUCAP in the last 168 hours. Lipid Profile: No results for input(s): CHOL, HDL, LDLCALC, TRIG, CHOLHDL, LDLDIRECT in the last 72 hours. Thyroid Function Tests: No results for input(s): TSH, T4TOTAL, FREET4, T3FREE, THYROIDAB in the last 72 hours. Anemia Panel: No results for input(s): VITAMINB12, FOLATE, FERRITIN, TIBC, IRON, RETICCTPCT in the last 72 hours.    Radiology Studies: I have reviewed all of the imaging during this hospital visit personally     Scheduled Meds: . docusate sodium  100 mg Oral BID  . [START ON 07/27/2020] enoxaparin (LOVENOX) injection  40 mg Subcutaneous Q24H  . gabapentin  100 mg Oral TID  . metoprolol succinate  25 mg Oral Daily    Continuous Infusions: . 0.9 % NaCl with KCl 20 mEq / L 75 mL/hr at 07/26/20 1406  .  ceFAZolin (ANCEF) IV 2 g (07/26/20 1412)  . methocarbamol (ROBAXIN) IV       LOS: 0 days        Kamaile Zachow Gerome Apley, MD

## 2020-07-26 NOTE — Anesthesia Procedure Notes (Signed)
Procedure Name: Intubation Date/Time: 07/26/2020 8:52 AM Performed by: Oletta Lamas, CRNA Pre-anesthesia Checklist: Patient identified, Emergency Drugs available, Suction available and Patient being monitored Patient Re-evaluated:Patient Re-evaluated prior to induction Oxygen Delivery Method: Circle System Utilized Preoxygenation: Pre-oxygenation with 100% oxygen Induction Type: IV induction Ventilation: Mask ventilation without difficulty Laryngoscope Size: Miller and 2 Grade View: Grade I Tube type: Oral Number of attempts: 1 Airway Equipment and Method: Stylet and Oral airway Placement Confirmation: ETT inserted through vocal cords under direct vision,  positive ETCO2 and breath sounds checked- equal and bilateral Secured at: 21 cm Tube secured with: Tape Dental Injury: Teeth and Oropharynx as per pre-operative assessment

## 2020-07-26 NOTE — Anesthesia Preprocedure Evaluation (Signed)
Anesthesia Evaluation  Patient identified by MRN, date of birth, ID band Patient awake    Reviewed: Allergy & Precautions, NPO status , Patient's Chart, lab work & pertinent test results  Airway Mallampati: II  TM Distance: >3 FB Neck ROM: Full    Dental  (+) Edentulous Upper, Edentulous Lower   Pulmonary Current Smoker,     + decreased breath sounds      Cardiovascular hypertension,  Rhythm:Regular Rate:Normal     Neuro/Psych    GI/Hepatic   Endo/Other    Renal/GU      Musculoskeletal   Abdominal   Peds  Hematology   Anesthesia Other Findings   Reproductive/Obstetrics                             Anesthesia Physical Anesthesia Plan  ASA: III  Anesthesia Plan: General   Post-op Pain Management:  Regional for Post-op pain   Induction: Intravenous  PONV Risk Score and Plan: Ondansetron and Dexamethasone  Airway Management Planned: Oral ETT  Additional Equipment:   Intra-op Plan:   Post-operative Plan:   Informed Consent: I have reviewed the patients History and Physical, chart, labs and discussed the procedure including the risks, benefits and alternatives for the proposed anesthesia with the patient or authorized representative who has indicated his/her understanding and acceptance.       Plan Discussed with: Anesthesiologist and CRNA  Anesthesia Plan Comments:         Anesthesia Quick Evaluation

## 2020-07-26 NOTE — Anesthesia Procedure Notes (Signed)
Anesthesia Regional Block: Axillary brachial plexus block   Pre-Anesthetic Checklist: ,, timeout performed, Correct Patient, Correct Site, Correct Laterality, Correct Procedure, Correct Position, site marked, Risks and benefits discussed,  Surgical consent,  Pre-op evaluation,  At surgeon's request and post-op pain management  Laterality: Right  Prep: chloraprep       Needles:  Injection technique: Single-shot  Needle Type: Echogenic Stimulator Needle          Additional Needles:   Procedures:,,,, ultrasound used (permanent image in chart),,,,  Narrative:  Start time: 07/26/2020 8:50 AM End time: 07/26/2020 9:00 AM Injection made incrementally with aspirations every 5 mL.  Performed by: Personally  Anesthesiologist: Roberts Gaudy, MD  Additional Notes: 20 cc 0.75% Ropivacaine injected easily

## 2020-07-26 NOTE — Transfer of Care (Signed)
Immediate Anesthesia Transfer of Care Note  Patient: Kathleen Blankenship  Procedure(s) Performed: OPEN REDUCTION INTERNAL FIXATION (ORIF) ELBOW/OLECRANON FRACTURE (Right Elbow)  Patient Location: PACU  Anesthesia Type:GA combined with regional for post-op pain  Level of Consciousness: AAO X3  Airway & Oxygen Therapy: Patient Spontanous Breathing  Post-op Assessment: Report given to RN and Post -op Vital signs reviewed and stable  Post vital signs: Reviewed and stable  Last Vitals:  Vitals Value Taken Time  BP 167/83 07/26/20 1153  Temp 36.2 C 07/26/20 1147  Pulse 92 07/26/20 1200  Resp 22 07/26/20 1200  SpO2 94 % 07/26/20 1200  Vitals shown include unvalidated device data.  Last Pain:  Vitals:   07/26/20 1147  PainSc: 0-No pain         Complications: No complications documented.

## 2020-07-26 NOTE — Consult Note (Signed)
Orthopaedic Trauma Service (OTS) Consult   Patient ID: Kathleen Blankenship MRN: [id number] DOB/AGE: Mar 02, 1951 69 y.o.  Reason for Consult: Right elbow fracture/dislocation and Left proximal humerus fracture Referring Physician: Dr. Thornton Park, MD [facility]  HPI: Kathleen Blankenship is an 69 y.o. female who is being seen in consultation at the request of Dr. Mack Guise for evaluation of right elbow fracture dislocation left proximal humerus.  Patient is a right-hand-dominant female with a history of rheumatoid arthritis presented after a ground-level fall.  She was brought to [facility] where x-rays showed a right [place] fracture dislocation with associated radial head fracture along with a comminuted four-part proximal humerus fracture on the left side.  Due to the complexity of the injuries Dr. Mack Guise felt that this was outside the scope of practice and recommended transfer to [facility] for evaluation and treatment by orthopedic traumatologist.  Patient was transferred overnight and was evaluated this morning in the preop holding area.  Currently comfortable.  Denies any lower extremity injuries denies any numbness and tingling to her bilateral upper extremities.  She states that she lives alone but she has some family and friends that are available to help her out at home.  She states that she smokes about a pack a day.  She does have a history of RA for which she is on a number of oral medications.  She takes blood pressure medication but denies any recent heart history.  Past Medical History:  Diagnosis Date  . Arthritis    RA  . Hypertension     Past Surgical History:  Procedure Laterality Date  . CORONARY/GRAFT ACUTE MI REVASCULARIZATION N/A 05/28/2017   Procedure: Coronary/Graft Acute MI Revascularization;  Surgeon: Isaias Cowman, MD;  Location: [facility];  Service: Cardiovascular;  Laterality: N/A;  . LEFT HEART CATH AND CORONARY  ANGIOGRAPHY N/A 05/28/2017   Procedure: LEFT HEART CATH AND CORONARY ANGIOGRAPHY;  Surgeon: Isaias Cowman, MD;  Location: [facility];  Service: Cardiovascular;  Laterality: N/A;  . THYROID LOBECTOMY      Family History  Family history unknown: Yes    Social History:  reports that she has been smoking cigarettes. She has a 37.00 pack-year smoking history. She has never used smokeless tobacco. She reports that she does not drink alcohol and does not use drugs.  Allergies:  Allergies  Allergen Reactions  . Codeine Hives    Medications:  No current facility-administered medications on file prior to encounter.   Current Outpatient Medications on File Prior to Encounter  Medication Sig Dispense Refill  . albuterol (PROVENTIL HFA;VENTOLIN HFA) 108 (90 Base) MCG/ACT inhaler Inhale 2 puffs into the lungs every 6 (six) hours as needed for wheezing or shortness of breath. 1 Inhaler 2  . atorvastatin (LIPITOR) 40 MG tablet Take 1 tablet (40 mg total) by mouth daily at 6 PM. 30 tablet 0  . colchicine 0.6 MG tablet Take 1 tablet (0.6 mg total) by mouth daily. 5 tablet 0  . inFLIXimab (REMICADE) 100 MG injection Inject 100 mg into the vein.    Marland Kitchen leflunomide (ARAVA) 20 MG tablet Take 20 mg by mouth daily.    Marland Kitchen losartan (COZAAR) 50 MG tablet Take 50 mg by mouth daily.    . sertraline (ZOLOFT) 50 MG tablet Take 50 mg by mouth at bedtime.    . traMADol (ULTRAM) 50 MG tablet Take 50 mg by mouth every 6 (six) hours as needed.      ROS:  Constitutional: No fever or chills Vision: No changes in vision ENT: No difficulty swallowing CV: No chest pain Pulm: No SOB or wheezing GI: No nausea or vomiting GU: No urgency or inability to hold urine Skin: No poor wound healing Neurologic: No numbness or tingling Psychiatric: No depression or anxiety Heme: No bruising Allergic: No reaction to medications or food   Exam: Height 5\' 3"  (1.6 m), weight 57.6 kg. General: No acute  distress Orientation: Awake alert and oriented x3 Mood and Affect: Cooperative and pleasant Gait: Within normal limits Coordination and balance: Within normal limits  Right upper extremity:  Patient neurovascularly intact with motor and sensory function intact median, radial and ulnar nerve distribution warm well-perfused hand with brisk cap refill.  Splint is in place is clean dry and intact.  No deformity about the shoulder.  Reflexes within normal limits no lymphadenopathy.  Left upper extremity: Significant swelling about the shoulder with some mild ecchymosis no skin lesions.  Able to bend her elbow and extend her elbow but does have the discomfort.  Compartments are soft compressible.  Motor and sensory function intact in median, radial and ulnar nerve distribution warm well-perfused hand with brisk cap refill of less than 2 seconds.  No instability about the wrist.  Unable to test the stability about the elbow.  Bilateral lower extremities: Skin without lesions. No tenderness to palpation. Full painless ROM, full strength in each muscle groups without evidence of instability.   Medical Decision Making: Data: Imaging: X-rays and CT scan are reviewed which shows a comminuted proximal ulna fracture with associated angulation and dislocation of the radial head with associated fracture of the radial head.  X-rays and CT of the left shoulder shows a four-part proximal humerus fracture with significant involvement of the humeral head.  Labs:  Results for orders placed or performed during the hospital encounter of 07/26/20 (from the past 24 hour(s))  Surgical pcr screen     Status: None   Collection Time: 07/26/20  4:32 AM   Specimen: Nasal Mucosa; Nasal Swab  Result Value Ref Range   MRSA, PCR NEGATIVE NEGATIVE   Staphylococcus aureus NEGATIVE NEGATIVE  Comprehensive metabolic panel     Status: Abnormal   Collection Time: 07/26/20  5:56 AM  Result Value Ref Range   Sodium 136 135 - 145  mmol/L   Potassium 4.0 3.5 - 5.1 mmol/L   Chloride 104 98 - 111 mmol/L   CO2 23 22 - 32 mmol/L   Glucose, Bld 116 (H) 70 - 99 mg/dL   BUN 10 8 - 23 mg/dL   Creatinine, Ser 0.65 0.44 - 1.00 mg/dL   Calcium 8.5 (L) 8.9 - 10.3 mg/dL   Total Protein 5.4 (L) 6.5 - 8.1 g/dL   Albumin 3.2 (L) 3.5 - 5.0 g/dL   AST 22 15 - 41 U/L   ALT 12 0 - 44 U/L   Alkaline Phosphatase 41 38 - 126 U/L   Total Bilirubin 1.0 0.3 - 1.2 mg/dL   GFR, Estimated >60 >60 mL/min   Anion gap 9 5 - 15  CBC with Differential/Platelet     Status: Abnormal   Collection Time: 07/26/20  5:56 AM  Result Value Ref Range   WBC 9.0 4.0 - 10.5 K/uL   RBC 3.94 3.87 - 5.11 MIL/uL   Hemoglobin 11.9 (L) 12.0 - 15.0 g/dL   HCT 36.1 36 - 46 %   MCV 91.6 80.0 - 100.0 fL   MCH 30.2 26.0 - 34.0 pg  MCHC 33.0 30.0 - 36.0 g/dL   RDW 15.7 (H) 11.5 - 15.5 %   Platelets 228 150 - 400 K/uL   nRBC 0.0 0.0 - 0.2 %   Neutrophils Relative % 79 %   Neutro Abs 7.1 1.7 - 7.7 K/uL   Lymphocytes Relative 10 %   Lymphs Abs 0.9 0.7 - 4.0 K/uL   Monocytes Relative 10 %   Monocytes Absolute 0.9 0.1 - 1.0 K/uL   Eosinophils Relative 1 %   Eosinophils Absolute 0.1 0.0 - 0.5 K/uL   Basophils Relative 0 %   Basophils Absolute 0.0 0.0 - 0.1 K/uL   Immature Granulocytes 0 %   Abs Immature Granulocytes 0.02 0.00 - 0.07 K/uL  Type and screen [place]     Status: None   Collection Time: 07/26/20  5:56 AM  Result Value Ref Range   ABO/RH(D) O POS    Antibody Screen NEG    Sample Expiration      07/29/2020,2359 Performed at [facility], [place] [address], [place], [place] [address]   ABO/Rh     Status: None   Collection Time: 07/26/20  7:35 AM  Result Value Ref Range   ABO/RH(D)      O POS Performed at [place] [address], [place], [place] [address]     Imaging or Labs ordered: None  Medical history and chart was reviewed and case discussed with medical provider.  Assessment/Plan: 70 year old  female with history of rheumatoid arthritis with a complex right elbow fracture dislocation and a left four-part proximal humerus fracture  Patient requires a formal open reduction internal fixation of proximal ulna with a radial head replacement.  We will plan to perform this today.  Risks and benefits were discussed with the patient.  Risks included but not limited to bleeding, infection, malunion, nonunion, hardware failure, hardware irritation, nerve and blood vessel injury, elbow stiffness, elbow instability, DVT, even the possibility anesthetic complications.  The patient agreed to proceed with surgery and consent was obtained.  In regards to her left proximal humerus this is unreconstructable.  I have reached out to Dr. Griffin Basil who will plan for reverse total shoulder arthroplasty likely later this week.  Formal consult to follow from his team.  Shona Needles, MD [facility] [phone] (office) [email or web address]

## 2020-07-26 NOTE — Op Note (Signed)
Orthopaedic Surgery Operative Note (CSN: [id number] ) Date of Surgery: 07/26/2020  Admit Date: 07/26/2020   Diagnoses: Pre-Op Diagnoses: Right Monteggia fracture dislocation of proximal ulnar/radial head   Post-Op Diagnosis: Same  Procedures: CPT 24587-Open reduction internal fixation of right elbow with radial head replacement  Surgeons : Primary: Shona Needles, MD  Assistant: Patrecia Pace, PA-C  Location: OR 7   Anesthesia:General with regional block  Antibiotics: Ancef 2g preop with 1 gm vancomycin powder placed topically   Tourniquet time:None used    Estimated Blood PRFF:63 mL  Complications:None   Specimens:None   Implants: Implant Name Type Inv. Item Serial No. Manufacturer Lot No. LRB No. Used Action  PLATE OLECRANON LRG - [id number] Plate PLATE OLECRANON LRG  ZIMMER [employer]ON(ORTH,TRAU,BIO,SG)  Right 1 Implanted  SCREW LOCK CORT STAR 3.5X14 - [id number] Screw SCREW LOCK CORT STAR 3.5X14  [employer]IM[employer](ORTH,TRAU,BIO,SG)  Right 2 Implanted  SCREW LOCK CORT STAR 3.5X16 - [id number] Screw SCREW LOCK CORT STAR 3.5X16  ZIMMER [employer]ON(ORTH,TRAU,BIO,SG)  Right 2 Implanted  SCREW LOCK CORT STAR 3.5X18 - [id number] Screw SCREW LOCK CORT STAR 3.5X18  ZIMMER [employer]ON(ORTH,TRAU,BIO,SG)  Right 2 Implanted  SCREW LOCK CORT STAR 3.5X26 - [id number] Screw SCREW LOCK CORT STAR 3.5X26  ZIMMER [employer]ON(ORTH,TRAU,BIO,SG)  Right 1 Implanted  SCREW LOCK CORT STAR 3.5X50 - [id number] Screw SCREW LOCK CORT STAR 3.5X50  ZIMMER [employer]ON(ORTH,TRAU,BIO,SG)  Right 1 Implanted  3.5 T15 MDS X18MM      Right 1 Implanted  RADIAL HEAD 10X20 - [id number] Head RADIAL HEAD 10X20  ZIMMER [employer]ON(ORTH,TRAU,BIO,SG) [id number] Right 1 Implanted  STEM Henrene [employer] SCREW - [id number] Stem STEM IMPLANT W SCREW  ZIMMER RECON(ORTH,TRAU,BIO,SG) [id number] Right 1 Implanted     Indications for Surgery: 69 year old female who sustained a ground-level fall.  Kathleen Blankenship has a history of rheumatoid arthritis.  Kathleen Blankenship was found to have a complex right elbow  fracture dislocation as well as a left four-part proximal humerus fracture.  Kathleen Blankenship was transferred to [facility] for definitive management by orthopedic traumatology.  I recommended proceeding with open reduction internal fixation of her right elbow.  Risks and benefits were discussed with the patient.  Risks include but not limited to bleeding, infection, malunion, nonunion, hardware failure, hardware irritation, nerve or blood vessel injury, DVT, elbow stiffness, elbow instability, even the possibility of anesthetic complications.  Patient agreed to proceed with surgery and consent was obtained.  Operative Findings: 1.  Open reduction internal fixation of Monteggia variant proximal ulna fracture using [employer]immer [employer] ALPS olecranon plate 2.  Radial head replacement for comminuted radial neck fracture using Zimmer Explor 10x20 radial head with size 6 stem  Procedure: The patient was identified in the preoperative holding area. Consent was confirmed with the patient and their family and all questions were answered. The operative extremity was marked after confirmation with the patient. Kathleen Blankenship was then brought back to the operating room by our anesthesia colleagues.  Kathleen Blankenship was placed under general anesthetic.  A regional anesthetic was performed.  A Foley catheter was placed.  The patient was then transferred over to a radiolucent flat top table.  The right upper extremity was then prepped and draped in usual sterile fashion.  A timeout was performed to verify the patient, the procedure, and the extremity.  Preoperative antibiotics were dosed.  Fluoroscopic imaging was obtained to show the unstable nature of her injury.  I made a direct posterior approach to the elbow and proximal ulna.  I carried it down through skin and subcutaneous tissue.  I  exposed the fracture and the ulnar shaft between the ECU and FCU.  I exposed the proximal extent of the olecranon as well.  I was able to have a good anatomic cortical read  for the olecranon.  I held it provisionally and then chose to use a Zimmer [employer] ALPS olecranon plate held this provisionally with a K wire on the distal and proximal ulna.  I then placed a nonlocking screw into the distal ulnar shaft and confirmed positioning with fluoroscopy.  I then placed a locking screws in the proximal segment as her bone was very poor quality.  I removed and exchanged the nonlocking screw in the shaft segment with a locking screw.  I then contoured the plate in situ to fit better along the olecranon and placed a locking screws to hold the tip of the olecranon to provide further fixation.  I placed a total of 3 locking screws in the distal shaft segment.  Fluoroscopic imaging was obtained and anatomic reduction of the radiocapitellar joint was present.  However with the extent of the radial neck fracture and the distal amount of it I felt that radial head replacement would be most appropriate.  I mobilized the skin flap along the lateral aspect of the elbow and developed a Kocher interval to expose the radiocapitellar joint.  I incised through the annular ligament and was able to remove the radial head en bloc.  I then measured and chose to trial a 10 x 28mm radial head.  I then sequentially broached the radial shaft and was pleased with the size of the size 6 stem.  Trialed off of this and felt that the alignment and fit of the prosthesis was appropriate.  I confirmed with AP fluoroscopic imaging of the elbow that the joint was not overstuffed.  I then removed the trial and placed the final implants.  Final fluoroscopic imaging was obtained.  The incision was copiously irrigated.  The Kocher interval and the annular ligament was repaired with 0 Vicryl suture.  The skin was closed with 0 Vicryl, 2-0 Vicryl and 3-0 Monocryl with Dermabond.  A gram of vancomycin powder was placed prior to closure.  A sterile dressing was placed to the right upper extremity.  The patient was then awoken from  anesthesia and taken to the PACU in stable condition.  Post Op Plan/Instructions: The patient will be nonweightbearing to the right upper extremity.  Kathleen Blankenship will have unrestricted range of motion to the right arm.  Kathleen Blankenship will undergo reverse total shoulder arthroplasty likely in a few days with Dr. Griffin Basil.  Kathleen Blankenship will receive Lovenox for DVT prophylaxis.  Ancef for surgical prophylaxis.  We will mobilize her with physical and Occupational Therapy.  I was present and performed the entire surgery.  Patrecia Pace, PA-C did assist me throughout the case. An assistant was necessary given the difficulty in approach, maintenance of reduction and ability to instrument the fracture.   Katha Hamming, MD [facility]

## 2020-07-26 NOTE — Progress Notes (Signed)
PT Cancellation Note  Patient Details Name: Kathleen Blankenship MRN: [id number] DOB: [birth year]   Cancelled Treatment:    Reason Eval/Treat Not Completed: Patient at procedure or test/unavailable Pt currently in OR. Will follow up as schedule allows.   Reuel Derby, PT, DPT  Acute Rehabilitation Services  Pager: [phone] Office: [phone]    Rudean Hitt 07/26/2020, 9:08 AM

## 2020-07-26 NOTE — Evaluation (Signed)
Physical Therapy Evaluation Patient Details Name: Kathleen Blankenship MRN: [id number] DOB: Feb 11, 1951 Today's Date: 07/26/2020   History of Present Illness  Kathleen Blankenship is an 69 y.o. female admitted after falling suffering fracutre of right elbow now s/p ORIF 07/26/2020 and fracture dislocation left proximal humerus.  PMH positive for COPD, rheumatoid arthritis, hypertension, thyroidectomy, depression, tobacco abuse.  Clinical Impression  Patient presents with decreased mobility due to limited UE use, pain, decreased balance, decreased safety and will benefit from skilled PT in the acute setting.  She will need care for BADL's due to likely to be NWB bilateral UE for some time, but reports family and friends can help at currently not wanting SNF level rehab.  She was min A for mobility up to [facility] today.  PT to continue to follow acutely.  If has capable help at home recommending HHPT.    Follow Up Recommendations Home health PT;Supervision/Assistance - 24 hour    Equipment Recommendations  None recommended by PT    Recommendations for Other Services       Precautions / Restrictions Precautions Precautions: Fall Required Braces or Orthoses: Sling Restrictions Weight Bearing Restrictions: Yes RUE Weight Bearing: Non weight bearing LUE Weight Bearing: Non weight bearing Other Position/Activity Restrictions: assumed NWB L UE as well      Mobility  Bed Mobility Overal bed mobility: Needs Assistance Bed Mobility: Supine to Sit;Sit to Supine     Supine to sit: Min assist Sit to supine: Min assist   General bed mobility comments: helping her behind her back to come up to sit and to lower to supine so she doesn't have to use her UE's    Transfers Overall transfer level: Needs assistance Equipment used: None Transfers: Sit to/from [facility] Sit to Stand: Min assist Stand pivot transfers: Min assist       General transfer comment: assisted with gait belt to [facility]  then back to bed  Ambulation/Gait             General Gait Details: deferred due to post surgical and IV out with  bleeding during session  Stairs            Wheelchair Mobility    Modified Rankin (Stroke Patients Only)       Balance Overall balance assessment: Needs assistance   Sitting balance-Leahy Scale: Fair       Standing balance-Leahy Scale: Poor Standing balance comment: not formally tested, assisted up to [facility] and back to bed                             Pertinent Vitals/Pain Pain Assessment: Faces Faces Pain Scale: Hurts little more Pain Location: L shoulder (reports R elbow still numb from surgery) Pain Descriptors / Indicators: Grimacing;Guarding Pain Intervention(s): Monitored during session;Repositioned;Limited activity within patient's tolerance    Home Living Family/patient expects to be discharged to:: Private residence Living Arrangements: Alone Available Help at Discharge: Family;Friend(s);Available 24 hours/day Type of Home: Mobile home Home Access: Stairs to enter   Entrance Stairs-Number of Steps: 5 Home Layout: One level        Prior Function Level of Independence: Independent         Comments: reports her friends and sister can assist at discharge     Hand Dominance        Extremity/Trunk Assessment   Upper Extremity Assessment Upper Extremity Assessment: LUE deficits/detail;RUE deficits/detail RUE Deficits / Details: in sling, but  pt holding arm up at times; able to grasp with hand RUE: Unable to fully assess due to immobilization LUE Deficits / Details: in sling to start, but removed due to IV inadvertently removed during sling reapplication after changing her gown; RN aware; able to squeeze hand LUE: Unable to fully assess due to immobilization    Lower Extremity Assessment Lower Extremity Assessment: Overall WFL for tasks assessed    Cervical / Trunk Assessment Cervical / Trunk Assessment:  Kyphotic  Communication   Communication: No difficulties  Cognition Arousal/Alertness: Awake/alert Behavior During Therapy: WFL for tasks assessed/performed Overall Cognitive Status: Within Functional Limits for tasks assessed                                        General Comments General comments (skin integrity, edema, etc.): friend in the room and reports she will be happy to help her even if need to help wipe her bottom.  Had discussed SNF, but pt and friend both report she will have help    Exercises     Assessment/Plan    PT Assessment Patient needs continued PT services  PT Problem List Decreased mobility;Decreased activity tolerance;Decreased balance;Pain       PT Treatment Interventions DME instruction;Therapeutic activities;Therapeutic exercise;Gait training;Patient/family education;Balance training;Functional mobility training;Stair training    PT Goals (Current goals can be found in the Care Plan section)  Acute Rehab PT Goals Patient Stated Goal: to go home PT Goal Formulation: With patient Time For Goal Achievement: 08/09/20 Potential to Achieve Goals: Good    Frequency Min 5X/week   Barriers to discharge        Co-evaluation               AM-PAC PT "6 Clicks" Mobility  Outcome Measure Help needed turning from your back to your side while in a flat bed without using bedrails?: A Little Help needed moving from lying on your back to sitting on the side of a flat bed without using bedrails?: A Little Help needed moving to and from a bed to a chair (including a wheelchair)?: A Little Help needed standing up from a chair using your arms (e.g., wheelchair or bedside chair)?: A Little Help needed to walk in hospital room?: A Little Help needed climbing 3-5 steps with a railing? : A Lot 6 Click Score: 17    End of Session Equipment Utilized During Treatment: Gait belt (sling) Activity Tolerance: Patient tolerated treatment well Patient  left: in bed;with call bell/phone within reach;with family/visitor present Nurse Communication: Mobility status;Other (comment) (IV came out, pt needing dressing) PT Visit Diagnosis: Other abnormalities of gait and mobility (R26.89)    Time: 4627-0350 PT Time Calculation (min) (ACUTE ONLY): 27 min   Charges:   PT Evaluation $PT Eval Moderate Complexity: 1 Mod PT Treatments $Therapeutic Activity: 8-22 mins        Magda Kiel, PT Acute Rehabilitation Services KXFGH:[phone] Office:[phone] 07/26/2020   Reginia Naas 07/26/2020, 5:37 PM

## 2020-07-26 NOTE — Progress Notes (Signed)
Orthopedic Tech Progress Note Patient Details:  Kathleen Blankenship 26-Sep-1950 [id number]  Ortho Devices Type of Ortho Device: Sling immobilizer Ortho Device/Splint Location: LUE Ortho Device/Splint Interventions: Application, Ordered   Post Interventions Patient Tolerated: Well Instructions Provided: Poper ambulation with device, Adjustment of device   Kathleen Blankenship 07/26/2020, 6:52 PM

## 2020-07-26 NOTE — H&P (Signed)
History and Physical    Kathleen Blankenship PQZ:[id number] DOB: 1951/06/03 DOA: 07/26/2020  PCP: Tracie Harrier, MD  Patient coming from: Home.  Patient was transferred from [facility].  Chief Complaint: Fall.  HPI: Kathleen Blankenship is a 69 y.o. female with history of COPD, rheumatoid arthritis, hypertension, thyroidectomy, depression had a fall when patient was going to the saloon.  Patient tripped on something and fell onto her face and landed on her elbow and shoulder.  Patient did not have any loss of consciousness or chest pain.  ED Course: In the ER at [facility] CT head and CT maxillofacial did not show anything acute.  X-rays and CT scan showed right elbow fracture involving the proximal ulna and radius and left humeral head and neck fracture.  Orthopedic surgeon at [facility] requested transfer to [facility] for surgical procedure for which Dr. Doreatha Martin was consulted.  Labs show mild leukocytosis otherwise unremarkable.  Covid test was negative and EKG shows normal sinus rhythm with nonspecific T wave changes.  Review of Systems: As per HPI, rest all negative.   Past Medical History:  Diagnosis Date   Arthritis    RA   Hypertension     Past Surgical History:  Procedure Laterality Date   CORONARY/GRAFT ACUTE MI REVASCULARIZATION N/A 05/28/2017   Procedure: Coronary/Graft Acute MI Revascularization;  Surgeon: Isaias Cowman, MD;  Location: [facility];  Service: Cardiovascular;  Laterality: N/A;   LEFT HEART CATH AND CORONARY ANGIOGRAPHY N/A 05/28/2017   Procedure: LEFT HEART CATH AND CORONARY ANGIOGRAPHY;  Surgeon: Isaias Cowman, MD;  Location: [facility];  Service: Cardiovascular;  Laterality: N/A;   THYROID LOBECTOMY       reports that she has been smoking cigarettes. She has a 37.00 pack-year smoking history. She has never used smokeless tobacco. She reports that she does not  drink alcohol and does not use drugs.  Allergies  Allergen Reactions   Codeine Hives    Family History  Family history unknown: Yes    Prior to Admission medications   Medication Sig Start Date End Date Taking? Authorizing Provider  albuterol (PROVENTIL HFA;VENTOLIN HFA) 108 (90 Base) MCG/ACT inhaler Inhale 2 puffs into the lungs every 6 (six) hours as needed for wheezing or shortness of breath. 05/29/17   Epifanio Lesches, MD  atorvastatin (LIPITOR) 40 MG tablet Take 1 tablet (40 mg total) by mouth daily at 6 PM. 05/29/17   Epifanio Lesches, MD  colchicine 0.6 MG tablet Take 1 tablet (0.6 mg total) by mouth daily. 05/30/17   Epifanio Lesches, MD  inFLIXimab (REMICADE) 100 MG injection Inject 100 mg into the vein.    [provider]  leflunomide (ARAVA) 20 MG tablet Take 20 mg by mouth daily.    [provider]  losartan (COZAAR) 50 MG tablet Take 50 mg by mouth daily.    [provider]  sertraline (ZOLOFT) 50 MG tablet Take 50 mg by mouth at bedtime.    [provider]  traMADol (ULTRAM) 50 MG tablet Take 50 mg by mouth every 6 (six) hours as needed.    [provider]    Physical Exam: Constitutional: Moderately built and nourished. There were no vitals filed for this visit.  Blood pressure is 140/90 pulses 90/min respiration 18/min O2 sats 94% on room air. Eyes: Anicteric no pallor. ENMT: No discharge from the ears eyes nose or mouth. Neck: No mass felt.  No neck  rigidity. Respiratory: No rhonchi or crepitations. Cardiovascular: S1-S2 heard. Abdomen: Soft nontender bowel sounds present. Musculoskeletal: Patient's both upper extremities are in sling. Skin: No rash. Neurologic: Alert awake oriented to time place and person.  Moves all extremities. Psychiatric: Appears normal.  Normal affect.   Labs on Admission: I have personally reviewed following labs and imaging studies  CBC: Recent Labs  Lab 07/25/20 1417  WBC  12.9*  NEUTROABS 10.8*  HGB 13.7  HCT 40.6  MCV 91.6  PLT 761   Basic Metabolic Panel: Recent Labs  Lab 07/25/20 1417  NA 137  K 5.0  CL 105  CO2 21*  GLUCOSE 101*  BUN 9  CREATININE 0.63  CALCIUM 8.9   GFR: Estimated Creatinine Clearance: 55.7 mL/min (by C-G formula based on SCr of 0.63 mg/dL). Liver Function Tests: No results for input(s): AST, ALT, ALKPHOS, BILITOT, PROT, ALBUMIN in the last 168 hours. No results for input(s): LIPASE, AMYLASE in the last 168 hours. No results for input(s): AMMONIA in the last 168 hours. Coagulation Profile: No results for input(s): INR, PROTIME in the last 168 hours. Cardiac Enzymes: No results for input(s): CKTOTAL, CKMB, CKMBINDEX, TROPONINI in the last 168 hours. BNP (last 3 results) No results for input(s): PROBNP in the last 8760 hours. HbA1C: No results for input(s): HGBA1C in the last 72 hours. CBG: No results for input(s): GLUCAP in the last 168 hours. Lipid Profile: No results for input(s): CHOL, HDL, LDLCALC, TRIG, CHOLHDL, LDLDIRECT in the last 72 hours. Thyroid Function Tests: No results for input(s): TSH, T4TOTAL, FREET4, T3FREE, THYROIDAB in the last 72 hours. Anemia Panel: No results for input(s): VITAMINB12, FOLATE, FERRITIN, TIBC, IRON, RETICCTPCT in the last 72 hours. Urine analysis: No results found for: COLORURINE, APPEARANCEUR, LABSPEC, PHURINE, GLUCOSEU, HGBUR, BILIRUBINUR, KETONESUR, PROTEINUR, UROBILINOGEN, NITRITE, LEUKOCYTESUR Sepsis Labs: @LABRCNTIP (procalcitonin:4,lacticidven:4) ) Recent Results (from the past 240 hour(s))  Resp Panel by RT-PCR (Flu A&B, Covid) Nasopharyngeal Swab     Status: None   Collection Time: 07/25/20  3:54 PM   Specimen: Nasopharyngeal Swab; Nasopharyngeal(NP) swabs in vial transport medium  Result Value Ref Range Status   SARS Coronavirus 2 by RT PCR NEGATIVE NEGATIVE Final    Comment: (NOTE) SARS-CoV-2 target nucleic acids are NOT DETECTED.  The SARS-CoV-2 RNA is  generally detectable in upper respiratory specimens during the acute phase of infection. The lowest concentration of SARS-CoV-2 viral copies this assay can detect is 138 copies/mL. A negative result does not preclude SARS-Cov-2 infection and should not be used as the sole basis for treatment or other patient management decisions. A negative result may occur with  improper specimen collection/handling, submission of specimen other than nasopharyngeal swab, presence of viral mutation(s) within the areas targeted by this assay, and inadequate number of viral copies(<138 copies/mL). A negative result must be combined with clinical observations, patient history, and epidemiological information. The expected result is Negative.  Fact Sheet for Patients:  EntrepreneurPulse.com.au  Fact Sheet for Healthcare Providers:  IncredibleEmployment.be  This test is no t yet approved or cleared by the [place] FDA and  has been authorized for detection and/or diagnosis of SARS-CoV-2 by FDA under an Emergency Use Authorization (EUA). This EUA will remain  in effect (meaning this test can be used) for the duration of the COVID-19 declaration under Section 564(b)(1) of the Act, 21 U.S.C.section 360bbb-3(b)(1), unless the authorization is terminated  or revoked sooner.       Influenza A by PCR NEGATIVE NEGATIVE Final   Influenza B by  PCR NEGATIVE NEGATIVE Final    Comment: (NOTE) The Xpert Xpress SARS-CoV-2/FLU/RSV plus assay is intended as an aid in the diagnosis of influenza from Nasopharyngeal swab specimens and should not be used as a sole basis for treatment. Nasal washings and aspirates are unacceptable for Xpert Xpress SARS-CoV-2/FLU/RSV testing.  Fact Sheet for Patients: EntrepreneurPulse.com.au  Fact Sheet for Healthcare Providers: IncredibleEmployment.be  This test is not yet approved or cleared by the [place] FDA and has been authorized for detection and/or diagnosis of SARS-CoV-2 by FDA under an Emergency Use Authorization (EUA). This EUA will remain in effect (meaning this test can be used) for the duration of the COVID-19 declaration under Section 564(b)(1) of the Act, 21 U.S.C. section 360bbb-3(b)(1), unless the authorization is terminated or revoked.  Performed at [facility], [address] [place], [place] [address]      Radiological Exams on Admission: DG Elbow Complete Right  Result Date: 07/25/2020 CLINICAL DATA:  Post fall, now with right elbow pain. EXAM: RIGHT ELBOW - COMPLETE 3+ VIEW COMPARISON:  None FINDINGS: Acute displaced fractures involving the proximal radius and ulna with angulation and foreshortening, apex dorsal. No definitive intra-articular extension. Expected adjacent soft tissue swelling. No radiopaque foreign body. IMPRESSION: Acute, angulated fractures involving the proximal radius and ulna without definitive intra-articular extension. Electronically Signed   By: Sandi Mariscal M.D.   On: 07/25/2020 14:13   CT Head Wo Contrast  Result Date: 07/25/2020 CLINICAL DATA:  Facial trauma after fall. EXAM: CT HEAD WITHOUT CONTRAST CT MAXILLOFACIAL WITHOUT CONTRAST TECHNIQUE: Multidetector CT imaging of the head and maxillofacial structures were performed using the standard protocol without intravenous contrast. Multiplanar CT image reconstructions of the maxillofacial structures were also generated. COMPARISON:  None. FINDINGS: CT HEAD FINDINGS Brain: No evidence of acute infarction, hemorrhage, hydrocephalus, extra-axial collection or mass lesion/mass effect. Vascular: No hyperdense vessel or unexpected calcification. Skull: Normal. Negative for fracture or focal lesion. Other: None. CT MAXILLOFACIAL FINDINGS Osseous: No fracture or mandibular dislocation. No destructive process. Orbits: Negative. No traumatic or inflammatory finding. Sinuses: Clear. Soft  tissues: Negative. IMPRESSION: 1. Normal head CT. 2. No abnormality seen in maxillofacial region. Electronically Signed   By: Marijo Conception M.D.   On: 07/25/2020 12:53   CT SHOULDER LEFT WO CONTRAST  Result Date: 07/25/2020 CLINICAL DATA:  69 year old female with fall and left humeral neck fracture. EXAM: CT OF THE UPPER LEFT EXTREMITY WITHOUT CONTRAST TECHNIQUE: Multidetector CT imaging of the upper left extremity was performed according to the standard protocol. COMPARISON:  Radiograph dated 07/25/2020. FINDINGS: Bones/Joint/Cartilage There is a comminuted fracture of the left humeral neck and head with displaced fracture fragments. There is inferior positioning of the articular surface of the humeral head in relation to the glenoid consistent with a partial dislocation or subluxation. No other acute fracture. Ligaments Suboptimally assessed by CT. Muscles and Tendons There is intramuscular and articular edema and hematoma. No drainable fluid collection. Soft tissues Edema. No fluid collection. IMPRESSION: Comminuted fracture of the left humeral neck and head with inferior positioning of the articular surface of the humeral head in relation to the glenoid consistent with a partial dislocation or subluxation. Electronically Signed   By: Anner Crete M.D.   On: 07/25/2020 17:38   CT ELBOW RIGHT WO CONTRAST  Result Date: 07/25/2020 CLINICAL DATA:  Radial and ulnar fractures. EXAM: CT OF THE RIGHT ELBOW WITHOUT CONTRAST TECHNIQUE: Multidetector CT imaging of the RIGHT ELBOW was performed according to the standard protocol. COMPARISON:  Radiographs from 07/25/2020 FINDINGS: Bones/Joint/Cartilage The distal humerus appears intact. Comminuted fracture of the proximal ulnar metaphysis with a dominant transverse component shown on image 48 of series 10. There is also a posterolateral intermediary fragment measuring 2.3 cm as well as a lateral intermediary fragment measuring 2.1 cm. The dominant shaft  fragment is laterally displaced about 9 mm with respect to the dominant proximal fragment along with some mild apex posterior angulation. There is an impacted transverse fracture of the radial head/neck with almost 90 degrees of angulation between the remaining articular fragment and the shaft fragment as shown on image 41 of series 10. Elbow joint effusion. Ligaments Suboptimally assessed by CT. Muscles and Tendons Expected edema in musculature surrounding the fracture sites. Soft tissues Subcutaneous edema along the elbow especially posteriorly, suspected blood products or fluid in the olecranon bursa. 3D multiplanar reconstruction was performed for further depiction of the fractures. IMPRESSION: 1. Comminuted fracture of the proximal ulnar metaphysis. Mild angulation and moderate displacement. 2. Prominently angulated fracture at the junction of the radial head and neck. 3. Elbow joint effusion. 4. Subcutaneous edema along the elbow especially posteriorly, suspected blood products or fluid in the olecranon bursa. Electronically Signed   By: Van Clines M.D.   On: 07/25/2020 17:41   CT 3D RECON AT SCANNER  Result Date: 07/25/2020 : 3D reconstruction of the left shoulder was performed. Please see accession number [id number] [facility] for diagnostic report. Electronically Signed   By: Van Clines M.D.   On: 07/25/2020 17:43   CT 3D RECON AT SCANNER  Result Date: 07/25/2020 CLINICAL DATA:  Radial and ulnar fractures. EXAM: CT OF THE RIGHT ELBOW WITHOUT CONTRAST TECHNIQUE: Multidetector CT imaging of the RIGHT ELBOW was performed according to the standard protocol. COMPARISON:  Radiographs from 07/25/2020 FINDINGS: Bones/Joint/Cartilage The distal humerus appears intact. Comminuted fracture of the proximal ulnar metaphysis with a dominant transverse component shown on image 48 of series 10. There is also a posterolateral intermediary fragment measuring 2.3 cm as well as a lateral intermediary fragment  measuring 2.1 cm. The dominant shaft fragment is laterally displaced about 9 mm with respect to the dominant proximal fragment along with some mild apex posterior angulation. There is an impacted transverse fracture of the radial head/neck with almost 90 degrees of angulation between the remaining articular fragment and the shaft fragment as shown on image 41 of series 10. Elbow joint effusion. Ligaments Suboptimally assessed by CT. Muscles and Tendons Expected edema in musculature surrounding the fracture sites. Soft tissues Subcutaneous edema along the elbow especially posteriorly, suspected blood products or fluid in the olecranon bursa. 3D multiplanar reconstruction was performed for further depiction of the fractures. IMPRESSION: 1. Comminuted fracture of the proximal ulnar metaphysis. Mild angulation and moderate displacement. 2. Prominently angulated fracture at the junction of the radial head and neck. 3. Elbow joint effusion. 4. Subcutaneous edema along the elbow especially posteriorly, suspected blood products or fluid in the olecranon bursa. Electronically Signed   By: Van Clines M.D.   On: 07/25/2020 17:41   DG Shoulder Left  Result Date: 07/25/2020 CLINICAL DATA:  Post fall, now with shoulder pain. EXAM: LEFT SHOULDER - 2+ VIEW COMPARISON:  None. FINDINGS: There is an acute comminuted fracture involving both the anatomic and surgical necks of the proximal humerus with foreshortening and angulation, apex posterior and extension of the fracture to the glenohumeral joint. No additional fractures identified. Acromioclavicular joint spaces appear preserved. Expected adjacent soft tissue swelling.  No radiopaque foreign body Limited visualization  of the adjacent thorax demonstrates atherosclerotic plaque within the aortic arch. IMPRESSION: Acute, comminuted, displaced fracture involving both the anatomic and surgical necks of the proximal humerus with extension to the glenohumeral joint.  Electronically Signed   By: Sandi Mariscal M.D.   On: 07/25/2020 14:15   CT Maxillofacial Wo Contrast  Result Date: 07/25/2020 CLINICAL DATA:  Facial trauma after fall. EXAM: CT HEAD WITHOUT CONTRAST CT MAXILLOFACIAL WITHOUT CONTRAST TECHNIQUE: Multidetector CT imaging of the head and maxillofacial structures were performed using the standard protocol without intravenous contrast. Multiplanar CT image reconstructions of the maxillofacial structures were also generated. COMPARISON:  None. FINDINGS: CT HEAD FINDINGS Brain: No evidence of acute infarction, hemorrhage, hydrocephalus, extra-axial collection or mass lesion/mass effect. Vascular: No hyperdense vessel or unexpected calcification. Skull: Normal. Negative for fracture or focal lesion. Other: None. CT MAXILLOFACIAL FINDINGS Osseous: No fracture or mandibular dislocation. No destructive process. Orbits: Negative. No traumatic or inflammatory finding. Sinuses: Clear. Soft tissues: Negative. IMPRESSION: 1. Normal head CT. 2. No abnormality seen in maxillofacial region. Electronically Signed   By: Marijo Conception M.D.   On: 07/25/2020 12:53    EKG: Independently reviewed.  Normal sinus rhythm nonspecific changes.  Assessment/Plan Principal Problem:   Closed fracture dislocation of right elbow joint, initial encounter Active Problems:   CAD (coronary artery disease)   Rheumatoid arthritis (HCC)   Closed fracture dislocation of right elbow    1. Closed right elbow fracture involving the proximal ulna and radius and also left proximal humerus involving the head and neck fracture after mechanical fall.  Dr. Doreatha Martin has accepted the patient at [facility] for possible surgical correction of the right elbow.  We will keep patient n.p.o. pain relief medications. 2. History of hypertension reviewed patient's chart in care everywhere patient is on losartan and metoprolol.  Will continue metoprolol hold losartan and restart after surgery.  As needed IV  hydralazine.  Follow blood pressure trends. 3. Rheumatoid arthritis we will continue home medications after surgery. 4. Hyperlipidemia on statins presently n.p.o. 5. COPD not actively wheezing. 6. History of thyroidectomy.  Since patient will require surgical management of the fractures will need close monitoring and inpatient status.   DVT prophylaxis: SCDs for now in anticipation of surgery.  Avoiding anticoagulation.  May start pharmacological anticoagulation once okay with orthopedics. Code Status: Full code. Family Communication: Discussed with patient. Disposition Plan: May need rehab. Consults called: Orthopedics. Admission status: Inpatient.   Rise Patience MD [facility] Pager [phone].  If 7PM-7AM, please contact night-coverage [email or web address] Password [facility]  07/26/2020, 5:49 AM

## 2020-07-27 DIAGNOSIS — S42202A Unspecified fracture of upper end of left humerus, initial encounter for closed fracture: Secondary | ICD-10-CM

## 2020-07-27 DIAGNOSIS — I2510 Atherosclerotic heart disease of native coronary artery without angina pectoris: Secondary | ICD-10-CM

## 2020-07-27 DIAGNOSIS — M069 Rheumatoid arthritis, unspecified: Secondary | ICD-10-CM

## 2020-07-27 DIAGNOSIS — I2583 Coronary atherosclerosis due to lipid rich plaque: Secondary | ICD-10-CM

## 2020-07-27 DIAGNOSIS — S42401A Unspecified fracture of lower end of right humerus, initial encounter for closed fracture: Secondary | ICD-10-CM | POA: Diagnosis not present

## 2020-07-27 LAB — BASIC METABOLIC PANEL
Anion gap: 13 (ref 5–15)
BUN: 7 mg/dL — ABNORMAL LOW (ref 8–23)
CO2: 22 mmol/L (ref 22–32)
Calcium: 8.5 mg/dL — ABNORMAL LOW (ref 8.9–10.3)
Chloride: 102 mmol/L (ref 98–111)
Creatinine, Ser: 0.64 mg/dL (ref 0.44–1.00)
GFR, Estimated: >60 mL/min (ref >60)
Glucose, Bld: 108 mg/dL — ABNORMAL HIGH (ref 70–99)
Potassium: 3.7 mmol/L (ref 3.5–5.1)
Sodium: 137 mmol/L (ref 135–145)

## 2020-07-27 LAB — CBC
HCT: 31.2 % — ABNORMAL LOW (ref 36.0–46.0)
Hemoglobin: 10.3 g/dL — ABNORMAL LOW (ref 12.0–15.0)
MCH: 30.4 pg (ref 26.0–34.0)
MCHC: 33 g/dL (ref 30.0–36.0)
MCV: 92 fL (ref 80.0–100.0)
Platelets: 190 10*3/uL (ref 150–400)
RBC: 3.39 MIL/uL — ABNORMAL LOW (ref 3.87–5.11)
RDW: 15.7 % — ABNORMAL HIGH (ref 11.5–15.5)
WBC: 8.3 10*3/uL (ref 4.0–10.5)
nRBC: 0 % (ref 0.0–0.2)

## 2020-07-27 LAB — VITAMIN D 25 HYDROXY (VIT D DEFICIENCY, FRACTURES): Vit D, 25-Hydroxy: 17.16 ng/mL — ABNORMAL LOW (ref 30–100)

## 2020-07-27 MED ORDER — ACETAMINOPHEN 325 MG PO TABS
650.0000 mg | ORAL_TABLET | Freq: Four times a day (QID) | ORAL | Status: DC
  Administered 2020-07-28 – 2020-07-31 (×12): 650 mg via ORAL
  Filled 2020-07-27 (×13): qty 2

## 2020-07-27 MED ORDER — KETOROLAC TROMETHAMINE 15 MG/ML IJ SOLN
15.0000 mg | Freq: Four times a day (QID) | INTRAMUSCULAR | Status: AC
  Administered 2020-07-27 – 2020-07-28 (×5): 15 mg via INTRAVENOUS
  Filled 2020-07-27 (×5): qty 1

## 2020-07-27 NOTE — Consult Note (Signed)
ORTHOPAEDIC CONSULTATION  REQUESTING PHYSICIAN: Tawni Millers,*  Chief Complaint: Left proximal humerus fracture  HPI: Kathleen Blankenship is a 69 y.o. female with rheumatoid arthritis and a fall resulting in a complex right elbow fracture managed by Dr. Lennette Bihari Haddix and a left proximal humerus fracture, 4 part, the Dr. Doreatha Martin requested I participate in care for.  Patient's history of RA and significant displaced fracture with poor bone quality would likely preclude successful open reduction of fixation and I was asked to consider reverse shoulder arthroplasty.  Patient is doing well he sitting in her bed today comfortably her family members at the bedside.  Past Medical History:  Diagnosis Date  . Arthritis    RA  . Hypertension    Past Surgical History:  Procedure Laterality Date  . CORONARY/GRAFT ACUTE MI REVASCULARIZATION N/A 05/28/2017   Procedure: Coronary/Graft Acute MI Revascularization;  Surgeon: Isaias Cowman, MD;  Location: [facility];  Service: Cardiovascular;  Laterality: N/A;  . LEFT HEART CATH AND CORONARY ANGIOGRAPHY N/A 05/28/2017   Procedure: LEFT HEART CATH AND CORONARY ANGIOGRAPHY;  Surgeon: Isaias Cowman, MD;  Location: [facility];  Service: Cardiovascular;  Laterality: N/A;  . THYROID LOBECTOMY     Social History   Socioeconomic History  . Marital status: Divorced    Spouse name: Not on file  . Number of children: Not on file  . Years of education: Not on file  . Highest education level: Not on file  Occupational History  . Not on file  Tobacco Use  . Smoking status: Current Every Day Smoker    Packs/day: 1.00    Years: 37.00    Pack years: 37.00    Types: Cigarettes  . Smokeless tobacco: Never Used  Vaping Use  . Vaping Use: Never assessed  Substance and Sexual Activity  . Alcohol use: No  . Drug use: No  . Sexual activity: Never  Other Topics Concern  . Not on file  Social History Narrative  . Not  on file   Social Determinants of Health   Financial Resource Strain:   . Difficulty of Paying Living Expenses: Not on file  Food Insecurity:   . Worried About Charity fundraiser in the Last Year: Not on file  . Ran Out of Food in the Last Year: Not on file  Transportation Needs:   . Lack of Transportation (Medical): Not on file  . Lack of Transportation (Non-Medical): Not on file  Physical Activity:   . Days of Exercise per Week: Not on file  . Minutes of Exercise per Session: Not on file  Stress:   . Feeling of Stress : Not on file  Social Connections:   . Frequency of Communication with Friends and Family: Not on file  . Frequency of Social Gatherings with Friends and Family: Not on file  . Attends Religious Services: Not on file  . Active Member of Clubs or Organizations: Not on file  . Attends Archivist Meetings: Not on file  . Marital Status: Not on file   Family History  Family history unknown: Yes   Allergies  Allergen Reactions  . Codeine Hives  . Remicade [Infliximab] Other (See Comments)    Chest felt heavy, was given benadryl.   Prior to Admission medications   Medication Sig Start Date End Date Taking? Authorizing Provider  albuterol (PROVENTIL HFA;VENTOLIN HFA) 108 (90 Base) MCG/ACT inhaler Inhale 2 puffs into the lungs every 6 (six) hours  as needed for wheezing or shortness of breath. 05/29/17  Yes Epifanio Lesches, MD  atorvastatin (LIPITOR) 10 MG tablet Take 10 mg by mouth daily. 06/26/20  Yes [provider]  benzonatate (TESSALON) 200 MG capsule Take 200 mg by mouth daily as needed for cough. 07/04/20  Yes [provider]  hydroxychloroquine (PLAQUENIL) 200 MG tablet Take 200 mg by mouth daily.  06/26/20  Yes [provider]  leflunomide (ARAVA) 20 MG tablet Take 20 mg by mouth daily.   Yes [provider]  losartan (COZAAR) 50 MG tablet Take 50 mg by mouth daily.   Yes [provider]  metoprolol  succinate (TOPROL-XL) 50 MG 24 hr tablet Take 50 mg by mouth daily. 05/08/20  Yes [provider]  sertraline (ZOLOFT) 50 MG tablet Take 50 mg by mouth at bedtime.   Yes [provider]  traMADol (ULTRAM) 50 MG tablet Take 50 mg by mouth every 6 (six) hours as needed for moderate pain.    Yes [provider]  Upadacitinib ER (RINVOQ) 15 MG TB24 Take 15 mg by mouth daily.   Yes [provider]  vitamin B-12 (CYANOCOBALAMIN) 1000 MCG tablet Take 1,000 mcg by mouth daily.   Yes [provider]  atorvastatin (LIPITOR) 40 MG tablet Take 1 tablet (40 mg total) by mouth daily at 6 PM. Patient not taking: Reported on 07/26/2020 05/29/17   Epifanio Lesches, MD  colchicine 0.6 MG tablet Take 1 tablet (0.6 mg total) by mouth daily. 05/30/17   Epifanio Lesches, MD  inFLIXimab (REMICADE) 100 MG injection Inject 100 mg into the vein.    [provider]   DG Elbow 2 Views Right  Result Date: 07/26/2020 CLINICAL DATA:  69 year old female with ORIF of the right elbow. EXAM: RIGHT ELBOW - 2 VIEW COMPARISON:  Fluoroscopic intraoperative study dated 07/26/2020. FINDINGS: There is replacement of the radial head. Fixation plate and screws noted in the proximal ulna. The bones are osteopenic. There is no dislocation. Postsurgical changes of the soft tissues. IMPRESSION: Status post replacement of the radial head and plate and screws of the proximal ulna. Electronically Signed   By: Anner Crete M.D.   On: 07/26/2020 18:44   DG Elbow Complete Right  Result Date: 07/26/2020 CLINICAL DATA:  ORIF elbow EXAM: DG C-ARM 1-60 MIN; RIGHT ELBOW - COMPLETE 3+ VIEW FLUOROSCOPY TIME:  Fluoroscopy Time:  61.2 seconds. COMPARISON:  November 30 21. FINDINGS: Seven C-arm fluoroscopic images were obtained intraoperatively and submitted for post operative interpretation. These images demonstrate plate and screw fixation of the proximal ulna and radial head replacement. Please see  the performing provider's procedural report for further detail. IMPRESSION: Intraoperative fluoroscopic imaging, as detailed above. Electronically Signed   By: Margaretha Sheffield MD   On: 07/26/2020 11:25   DG Elbow Complete Right  Result Date: 07/25/2020 CLINICAL DATA:  Post fall, now with right elbow pain. EXAM: RIGHT ELBOW - COMPLETE 3+ VIEW COMPARISON:  None FINDINGS: Acute displaced fractures involving the proximal radius and ulna with angulation and foreshortening, apex dorsal. No definitive intra-articular extension. Expected adjacent soft tissue swelling. No radiopaque foreign body. IMPRESSION: Acute, angulated fractures involving the proximal radius and ulna without definitive intra-articular extension. Electronically Signed   By: Sandi Mariscal M.D.   On: 07/25/2020 14:13   CT Head Wo Contrast  Result Date: 07/25/2020 CLINICAL DATA:  Facial trauma after fall. EXAM: CT HEAD WITHOUT CONTRAST CT MAXILLOFACIAL WITHOUT CONTRAST TECHNIQUE: Multidetector CT imaging of the head and  maxillofacial structures were performed using the standard protocol without intravenous contrast. Multiplanar CT image reconstructions of the maxillofacial structures were also generated. COMPARISON:  None. FINDINGS: CT HEAD FINDINGS Brain: No evidence of acute infarction, hemorrhage, hydrocephalus, extra-axial collection or mass lesion/mass effect. Vascular: No hyperdense vessel or unexpected calcification. Skull: Normal. Negative for fracture or focal lesion. Other: None. CT MAXILLOFACIAL FINDINGS Osseous: No fracture or mandibular dislocation. No destructive process. Orbits: Negative. No traumatic or inflammatory finding. Sinuses: Clear. Soft tissues: Negative. IMPRESSION: 1. Normal head CT. 2. No abnormality seen in maxillofacial region. Electronically Signed   By: Marijo Conception M.D.   On: 07/25/2020 12:53   CT SHOULDER LEFT WO CONTRAST  Result Date: 07/25/2020 CLINICAL DATA:  69 year old female with fall and left  humeral neck fracture. EXAM: CT OF THE UPPER LEFT EXTREMITY WITHOUT CONTRAST TECHNIQUE: Multidetector CT imaging of the upper left extremity was performed according to the standard protocol. COMPARISON:  Radiograph dated 07/25/2020. FINDINGS: Bones/Joint/Cartilage There is a comminuted fracture of the left humeral neck and head with displaced fracture fragments. There is inferior positioning of the articular surface of the humeral head in relation to the glenoid consistent with a partial dislocation or subluxation. No other acute fracture. Ligaments Suboptimally assessed by CT. Muscles and Tendons There is intramuscular and articular edema and hematoma. No drainable fluid collection. Soft tissues Edema. No fluid collection. IMPRESSION: Comminuted fracture of the left humeral neck and head with inferior positioning of the articular surface of the humeral head in relation to the glenoid consistent with a partial dislocation or subluxation. Electronically Signed   By: Anner Crete M.D.   On: 07/25/2020 17:38   CT ELBOW RIGHT WO CONTRAST  Result Date: 07/25/2020 CLINICAL DATA:  Radial and ulnar fractures. EXAM: CT OF THE RIGHT ELBOW WITHOUT CONTRAST TECHNIQUE: Multidetector CT imaging of the RIGHT ELBOW was performed according to the standard protocol. COMPARISON:  Radiographs from 07/25/2020 FINDINGS: Bones/Joint/Cartilage The distal humerus appears intact. Comminuted fracture of the proximal ulnar metaphysis with a dominant transverse component shown on image 48 of series 10. There is also a posterolateral intermediary fragment measuring 2.3 cm as well as a lateral intermediary fragment measuring 2.1 cm. The dominant shaft fragment is laterally displaced about 9 mm with respect to the dominant proximal fragment along with some mild apex posterior angulation. There is an impacted transverse fracture of the radial head/neck with almost 90 degrees of angulation between the remaining articular fragment and the  shaft fragment as shown on image 41 of series 10. Elbow joint effusion. Ligaments Suboptimally assessed by CT. Muscles and Tendons Expected edema in musculature surrounding the fracture sites. Soft tissues Subcutaneous edema along the elbow especially posteriorly, suspected blood products or fluid in the olecranon bursa. 3D multiplanar reconstruction was performed for further depiction of the fractures. IMPRESSION: 1. Comminuted fracture of the proximal ulnar metaphysis. Mild angulation and moderate displacement. 2. Prominently angulated fracture at the junction of the radial head and neck. 3. Elbow joint effusion. 4. Subcutaneous edema along the elbow especially posteriorly, suspected blood products or fluid in the olecranon bursa. Electronically Signed   By: Van Clines M.D.   On: 07/25/2020 17:41   CT 3D RECON AT SCANNER  Result Date: 07/25/2020 : 3D reconstruction of the left shoulder was performed. Please see accession number [id number] [facility] for diagnostic report. Electronically Signed   By: Van Clines M.D.   On: 07/25/2020 17:43   CT 3D RECON AT SCANNER  Result Date: 07/25/2020 CLINICAL DATA:  Radial and ulnar fractures. EXAM: CT OF THE RIGHT ELBOW WITHOUT CONTRAST TECHNIQUE: Multidetector CT imaging of the RIGHT ELBOW was performed according to the standard protocol. COMPARISON:  Radiographs from 07/25/2020 FINDINGS: Bones/Joint/Cartilage The distal humerus appears intact. Comminuted fracture of the proximal ulnar metaphysis with a dominant transverse component shown on image 48 of series 10. There is also a posterolateral intermediary fragment measuring 2.3 cm as well as a lateral intermediary fragment measuring 2.1 cm. The dominant shaft fragment is laterally displaced about 9 mm with respect to the dominant proximal fragment along with some mild apex posterior angulation. There is an impacted transverse fracture of the radial head/neck with almost 90 degrees of angulation between the  remaining articular fragment and the shaft fragment as shown on image 41 of series 10. Elbow joint effusion. Ligaments Suboptimally assessed by CT. Muscles and Tendons Expected edema in musculature surrounding the fracture sites. Soft tissues Subcutaneous edema along the elbow especially posteriorly, suspected blood products or fluid in the olecranon bursa. 3D multiplanar reconstruction was performed for further depiction of the fractures. IMPRESSION: 1. Comminuted fracture of the proximal ulnar metaphysis. Mild angulation and moderate displacement. 2. Prominently angulated fracture at the junction of the radial head and neck. 3. Elbow joint effusion. 4. Subcutaneous edema along the elbow especially posteriorly, suspected blood products or fluid in the olecranon bursa. Electronically Signed   By: Van Clines M.D.   On: 07/25/2020 17:41   DG CHEST PORT 1 VIEW  Result Date: 07/26/2020 CLINICAL DATA:  Left shoulder fracture EXAM: PORTABLE CHEST 1 VIEW COMPARISON:  05/28/2017 FINDINGS: The lungs are symmetrically mildly hyperinflated in keeping with changes of underlying COPD. Minimal linear scarring at the left lung base. The lungs are otherwise clear. No pneumothorax or pleural effusion. Cardiac size within normal limits. Pulmonary vascularity is normal. Mild thoracic 6 point scoliosis is noted. An acute three-part fracture of the left humeral head is identified with inferior subluxation of the a articular surface of the humeral head from the glenoid fossa likely representing pseudosubluxation in the setting of a left shoulder effusion. IMPRESSION: COPD. Acute three-part fracture of the left humeral head with pseudosubluxation noted. Electronically Signed   By: Fidela Salisbury MD   On: 07/26/2020 06:19   DG Shoulder Left  Result Date: 07/25/2020 CLINICAL DATA:  Post fall, now with shoulder pain. EXAM: LEFT SHOULDER - 2+ VIEW COMPARISON:  None. FINDINGS: There is an acute comminuted fracture involving  both the anatomic and surgical necks of the proximal humerus with foreshortening and angulation, apex posterior and extension of the fracture to the glenohumeral joint. No additional fractures identified. Acromioclavicular joint spaces appear preserved. Expected adjacent soft tissue swelling.  No radiopaque foreign body Limited visualization of the adjacent thorax demonstrates atherosclerotic plaque within the aortic arch. IMPRESSION: Acute, comminuted, displaced fracture involving both the anatomic and surgical necks of the proximal humerus with extension to the glenohumeral joint. Electronically Signed   By: Sandi Mariscal M.D.   On: 07/25/2020 14:15   DG C-Arm 1-60 Min  Result Date: 07/26/2020 CLINICAL DATA:  ORIF elbow EXAM: DG C-ARM 1-60 MIN; RIGHT ELBOW - COMPLETE 3+ VIEW FLUOROSCOPY TIME:  Fluoroscopy Time:  61.2 seconds. COMPARISON:  November 30 21. FINDINGS: Seven C-arm fluoroscopic images were obtained intraoperatively and submitted for post operative interpretation. These images demonstrate plate and screw fixation of the proximal ulna and radial head replacement. Please see the performing provider's procedural report for further detail. IMPRESSION: Intraoperative fluoroscopic imaging, as detailed above. Electronically Signed  By: Margaretha Sheffield MD   On: 07/26/2020 11:25   CT Maxillofacial Wo Contrast  Result Date: 07/25/2020 CLINICAL DATA:  Facial trauma after fall. EXAM: CT HEAD WITHOUT CONTRAST CT MAXILLOFACIAL WITHOUT CONTRAST TECHNIQUE: Multidetector CT imaging of the head and maxillofacial structures were performed using the standard protocol without intravenous contrast. Multiplanar CT image reconstructions of the maxillofacial structures were also generated. COMPARISON:  None. FINDINGS: CT HEAD FINDINGS Brain: No evidence of acute infarction, hemorrhage, hydrocephalus, extra-axial collection or mass lesion/mass effect. Vascular: No hyperdense vessel or unexpected calcification. Skull:  Normal. Negative for fracture or focal lesion. Other: None. CT MAXILLOFACIAL FINDINGS Osseous: No fracture or mandibular dislocation. No destructive process. Orbits: Negative. No traumatic or inflammatory finding. Sinuses: Clear. Soft tissues: Negative. IMPRESSION: 1. Normal head CT. 2. No abnormality seen in maxillofacial region. Electronically Signed   By: Marijo Conception M.D.   On: 07/25/2020 12:53   Family History Reviewed and non-contributory, no pertinent history of problems with bleeding or anesthesia      Review of Systems 14 system ROS conducted and negative except for that noted in HPI   OBJECTIVE  Vitals: Patient Vitals for the past 8 hrs:  BP Temp Temp src Pulse Resp SpO2  07/27/20 0236 139/76 (!) 97.4 F (36.3 C) Oral 80 15 96 %   General: Alert, no acute distress Cardiovascular: Warm extremities noted Respiratory: No cyanosis, no use of accessory musculature GI: No organomegaly, abdomen is soft and non-tender Skin: No lesions in the area of chief complaint other than those listed below in MSK exam.  Neurologic: Sensation intact distally save for the below mentioned MSK exam Psychiatric: Patient is competent for consent with normal mood and affect Lymphatic: No swelling obvious and reported other than the area involved in the exam below Extremities  Left upper extremity: Sling in place, deferred range of motion setting of fracture, distal motor tendon functions intact.  Axillary nerve sensation appears to be preserved there was difficulty get the deltoid to fire in the setting of fracture.    Test Results Imaging Four-part proximal humerus fracture noted on the left with significant displacement and varus alignment.  Labs cbc Recent Labs    07/26/20 0556 07/27/20 0233  WBC 9.0 8.3  HGB 11.9* 10.3*  HCT 36.1 31.2*  PLT 228 190    Labs inflam No results for input(s): CRP in the last 72 hours.  Invalid input(s): ESR  Labs coag No results for input(s): INR,  PTT in the last 72 hours.  Invalid input(s): PT  Recent Labs    07/26/20 0556 07/27/20 0233  NA 136 137  K 4.0 3.7  CL 104 102  CO2 23 22  GLUCOSE 116* 108*  BUN 10 7*  CREATININE 0.65 0.64  CALCIUM 8.5* 8.5*     ASSESSMENT AND PLAN: 69 y.o. female with the following: Left four-part proximal humerus fracture in a patient with rheumatoid arthritis  Unfortunately patient has a complex injury.  She would benefit from surgical management the setting of a fracture that would very unlikely heal well on its own.  Open reduction internal fixation is extremely high risk of failure secondary to her comorbidities Wels the fracture pattern.  Talked with the risk benefits alternatives of a reverse shoulder arthroplasty including but not limited to infection, hardware failure, periprosthetic fracture, loss of fixation of tuberosities and dislocation.  Family elected to proceed.  We will plan for surgery first thing Saturday morning at 730.  N.p.o. midnight on Friday night.

## 2020-07-27 NOTE — Progress Notes (Signed)
Orthopaedic Trauma Progress Note  SUBJECTIVE: Reports mild/moderate pain about operative site. Nerve block has worn off. No chest pain. No SOB. No nausea/vomiting. No other complaints. Family member at bedside  OBJECTIVE:  Vitals:   07/26/20 2200 07/27/20 0236  BP: 140/72 139/76  Pulse: 90 80  Resp: 16 15  Temp: 98 F (36.7 C) (!) 97.4 F (36.3 C)  SpO2: 92% 96%    General: Sitting up in bed, NAD Respiratory: No increased work of breathing.  Right Upper Extremity: Dressing CDI. Loosened ace wrap to allow for more elbow motion. Tolerates gentle elbow motion. Motor and sensory function intact in median, ulnar, radial nerve distribution. Compartments soft and compressible. +radial pulse.  Left Upper Extremity: Sling in place. Significant swelling about the shoulder with some mild ecchymosis. No skin lesions.  Able to bend and extend her elbow but does have some discomfort. Shoulder motion not attempted.  Compartments are soft compressible.  Motor and sensory function intact in median, radial and ulnar nerve distribution. Hand warm and well-perfused with brisk cap refill.    IMAGING: Stable post op imaging of right elbow.   LABS:  Results for orders placed or performed during the hospital encounter of 07/26/20 (from the past 24 hour(s))  ABO/Rh     Status: None   Collection Time: 07/26/20  7:35 AM  Result Value Ref Range   ABO/RH(D)      O POS Performed at [facility] [address], [place], [place] [address]   Basic metabolic panel     Status: Abnormal   Collection Time: 07/27/20  2:33 AM  Result Value Ref Range   Sodium 137 135 - 145 mmol/L   Potassium 3.7 3.5 - 5.1 mmol/L   Chloride 102 98 - 111 mmol/L   CO2 22 22 - 32 mmol/L   Glucose, Bld 108 (H) 70 - 99 mg/dL   BUN 7 (L) 8 - 23 mg/dL   Creatinine, Ser 0.64 0.44 - 1.00 mg/dL   Calcium 8.5 (L) 8.9 - 10.3 mg/dL   GFR, Estimated >60 >60 mL/min   Anion gap 13 5 - 15  CBC     Status: Abnormal   Collection Time:  07/27/20  2:33 AM  Result Value Ref Range   WBC 8.3 4.0 - 10.5 K/uL   RBC 3.39 (L) 3.87 - 5.11 MIL/uL   Hemoglobin 10.3 (L) 12.0 - 15.0 g/dL   HCT 31.2 (L) 36 - 46 %   MCV 92.0 80.0 - 100.0 fL   MCH 30.4 26.0 - 34.0 pg   MCHC 33.0 30.0 - 36.0 g/dL   RDW 15.7 (H) 11.5 - 15.5 %   Platelets 190 150 - 400 K/uL   nRBC 0.0 0.0 - 0.2 %    ASSESSMENT: Kathleen Blankenship is a 69 y.o. female, 1 Day Post-Op  Injuries: 1. Right [place] fracture dislocation of proximal ulnar/radial head s/p ORIF elbow with radial head replacement 2. Left 4-part proximal humerus fracture with scheduled procedure 07/29/20 CV/Blood loss: Acute blood loss anemia, Hgb 10.3 this morning. Hemodynamically stable  PLAN: Weightbearing: NWB RUE and LUE Incisional and dressing care: Plan to remove dressing tomorrow to evaluate incision, will likely plan to leave open to air at that point  Showering: Bed bath today, Ok for shower tomorrow 07/28/20 Orthopedic device(s): Sling LUE, sling RUE for comfort Pain management:  1. Tylenol 650 mg q 6 hours scheduled 2. Robaxin 500 mg q 6 hours PRN 3. Tramadol 50-100 mg q 6 hours  PRN 4. Neurontin 100 mg TID 5. Fentanyl 25 mcg q 2 hours PRN 6. Toradol 15 mg q 6 hours x 5 doses VTE prophylaxis: Lovenox, SCDs ID:  Ancef 2gm post op Foley/Lines:  No foley, KVO IVFs Impediments to Fracture Healing: Vit D level pending, will start supplementation as indicated Dispo: PT/OT eval, currently recommending HH. Patient scheduled for reverse TSA with Dr. Griffin Basil on 07/29/20. Will need to be NPO after midnight on 07/29/20.  Follow - up plan: 2 weeks  Contact information:  Katha Hamming MD, Patrecia Pace PA-C. After hours and holidays please check [email or web address] for group call information for [facility]   Jetaun Colbath A. Ricci Barker, PA-C [phone] (office) [email or web address]

## 2020-07-27 NOTE — Progress Notes (Signed)
Physical Therapy Treatment Patient Details Name: Kathleen Blankenship MRN: [id number] DOB: [birth year] Today's Date: 07/27/2020    History of Present Illness Kathleen Blankenship is an 69 y.o. female admitted after falling suffering fracutre of right elbow now s/p ORIF 07/26/2020 and fracture dislocation left proximal humerus.  PMH positive for COPD, rheumatoid arthritis, hypertension, thyroidectomy, depression, tobacco abuse.    PT Comments    Pt supine in bed on arrival this session.  Educated on weight bearing restriction and mobility.  Pt progressed to gt and stair training with min assistance to maintain balance.  Gt belt in room and caregiver educated to take home to assist patient with mobility.  Pt tolerated session well.  POst gt performed incentive spirometer with cues for technique.     Follow Up Recommendations  Home health PT;Supervision/Assistance - 24 hour     Equipment Recommendations  None recommended by PT    Recommendations for Other Services       Precautions / Restrictions Precautions Precautions: Fall Required Braces or Orthoses: Sling Restrictions Weight Bearing Restrictions: Yes RUE Weight Bearing: Non weight bearing LUE Weight Bearing: Non weight bearing Other Position/Activity Restrictions: Per MD note: can perform unrestricted AROM of R elbow as of 07/27/2020    Mobility  Bed Mobility Overal bed mobility: Needs Assistance Bed Mobility: Supine to Sit;Sit to Supine     Supine to sit: Supervision Sit to supine: Supervision   General bed mobility comments: Pt able to move into and OOB this session unassisted.  Transfers Overall transfer level: Needs assistance Equipment used: None Transfers: Sit to/from Stand Sit to Stand: Min assist Stand pivot transfers: Min assist       General transfer comment: Cues for maintaining weight bearing, rocking forward, and pushing with B LEs into standing.  Ambulation/Gait Ambulation/Gait assistance: Min assist Gait  Distance (Feet): 300 Feet Assistive device: Rolling walker (2 wheeled) Gait Pattern/deviations: Step-through pattern;Trunk flexed;Drifts right/left;Staggering right;Narrow base of support     General Gait Details: MIld LOB x 3.  Pt required cues to increase BOS and allow RUE to swing for balance.   Stairs Stairs: Yes Stairs assistance: Min assist Stair Management: No rails Number of Stairs: 4 General stair comments: Assistance to balance.   Wheelchair Mobility    Modified Rankin (Stroke Patients Only)       Balance Overall balance assessment: Needs assistance   Sitting balance-Leahy Scale: Fair       Standing balance-Leahy Scale: Poor                              Cognition Arousal/Alertness: Awake/alert Behavior During Therapy: WFL for tasks assessed/performed Overall Cognitive Status: Within Functional Limits for tasks assessed                                        Exercises Other Exercises Other Exercises: IS x 10 reps. quality 1063ml-1500ml.  Cues for technique.    General Comments        Pertinent Vitals/Pain Pain Assessment: 0-10 Pain Score: 6  Pain Location: L shoulder and  R elbow Pain Descriptors / Indicators: Operative site guarding;Aching Pain Intervention(s): Monitored during session;Repositioned;Ice applied (Ice applied to R elbow.)    Home Living Family/patient expects to be discharged to:: Private residence Living Arrangements: Alone Available Help at Discharge: Family;Friend(s);Available 24 hours/day Type of Home: Mobile home Home  Access: Stairs to enter   Home Layout: One level Home Equipment: None      Prior Function Level of Independence: Independent      Comments: reports her friends and sister can assist at discharge   PT Goals (current goals can now be found in the care plan section) Acute Rehab PT Goals Patient Stated Goal: to go home Potential to Achieve Goals: Good Progress towards PT goals:  Progressing toward goals    Frequency    Min 5X/week      PT Plan Current plan remains appropriate    Co-evaluation              AM-PAC PT "6 Clicks" Mobility   Outcome Measure  Help needed turning from your back to your side while in a flat bed without using bedrails?: A Little Help needed moving from lying on your back to sitting on the side of a flat bed without using bedrails?: A Little Help needed moving to and from a bed to a chair (including a wheelchair)?: A Little Help needed standing up from a chair using your arms (e.g., wheelchair or bedside chair)?: A Little Help needed to walk in hospital room?: A Little Help needed climbing 3-5 steps with a railing? : A Little 6 Click Score: 18    End of Session Equipment Utilized During Treatment: Gait belt (sling) Activity Tolerance: Patient tolerated treatment well Patient left: in bed;with call bell/phone within reach;with family/visitor present Nurse Communication: Mobility status PT Visit Diagnosis: Other abnormalities of gait and mobility (R26.89)     Time: 1445-1510 PT Time Calculation (min) (ACUTE ONLY): 25 min  Charges:  $Gait Training: 8-22 mins $Therapeutic Activity: 8-22 mins                     Erasmo Leventhal , PTA Acute Rehabilitation Services Pager [phone] Office [phone]     Kathleen Blankenship 07/27/2020, 3:19 PM

## 2020-07-27 NOTE — Evaluation (Signed)
Occupational Therapy Evaluation Patient Details Name: Kathleen Blankenship MRN: [id number] DOB: 09-Mar-1951 Today's Date: 07/27/2020    History of Present Illness Kathleen Blankenship is an 69 y.o. female admitted after falling suffering fracutre of right elbow now s/p ORIF 07/26/2020 and fracture dislocation left proximal humerus.  PMH positive for COPD, rheumatoid arthritis, hypertension, thyroidectomy, depression, tobacco abuse.   Clinical Impression   Pt presents with decline in function and safety with ADLs and ADL mobility with impaired strength, balance, endurance and B UE AROM/function. Pt scheduled for L shoulder surgery 07/28/2020. Pt will have 24/7 assist at home from family. Pt would benefit from acute OT services to address impairments to maximize level of function and safety    Follow Up Recommendations  Home health OT;Supervision/Assistance - 24 hour    Equipment Recommendations  Tub/shower seat;Other (comment);Toilet riser    Recommendations for Other Services       Precautions / Restrictions Precautions Precautions: Fall Required Braces or Orthoses: Sling Restrictions Weight Bearing Restrictions: Yes RUE Weight Bearing: Non weight bearing LUE Weight Bearing: Non weight bearing Other Position/Activity Restrictions: Per MD note: can perform unrestricted AROM of R elbow as of 07/27/2020      Mobility Bed Mobility Overal bed mobility: Needs Assistance Bed Mobility: Supine to Sit;Sit to Supine     Supine to sit: Min assist Sit to supine: Min assist   General bed mobility comments: assist to elevate trunk    Transfers Overall transfer level: Needs assistance Equipment used: None Transfers: Sit to/from [employer] Sit to Stand: Min assist Stand pivot transfers: Min assist       General transfer comment: educated on sit - stand without use of UEs to push up from seated surfaces    Balance Overall balance assessment: Needs assistance   Sitting  balance-Leahy Scale: Fair       Standing balance-Leahy Scale: Poor                             ADL either performed or assessed with clinical judgement   ADL Overall ADL's : Needs assistance/impaired Eating/Feeding: Set up Eating/Feeding Details (indicate cue type and reason): set up to open containers and cut foods Grooming: Min guard Grooming Details (indicate cue type and reason): seated EOB to wash hands and lower half of face Upper Body Bathing: Moderate assistance Upper Body Bathing Details (indicate cue type and reason): B UEs AROM impaired Lower Body Bathing: Total assistance   Upper Body Dressing : Moderate assistance   Lower Body Dressing: Total assistance   Toilet Transfer: Minimal assistance Toilet Transfer Details (indicate cue type and reason): SPT to [facility] Toileting- Clothing Manipulation and Hygiene: Total assistance       Functional mobility during ADLs: Minimal assistance General ADL Comments: Pt and her sister educated on compensatory ADL/selfcare techniques and level of assist required as well as sling doffing/donning. Pt scheduled for L shoulder surgery 07/29/2020     Vision Baseline Vision/History: Wears glasses Patient Visual Report: No change from baseline       Perception     Praxis      Pertinent Vitals/Pain Pain Assessment: 0-10 Pain Score: 6  Pain Location: L shoulder and  R elbow Pain Descriptors / Indicators: Operative site guarding;Aching Pain Intervention(s): Monitored during session;RN gave pain meds during session;Repositioned;Relaxation     Hand Dominance Right   Extremity/Trunk Assessment Upper Extremity Assessment Upper Extremity Assessment: RUE deficits/detail;LUE deficits/detail RUE Deficits / Details:  with ACE wrap, unrestricted AROM allowed in R elbow as of 07/27/2020 per MD note LUE Deficits / Details: in sling  able to squeeze hand LUE: Unable to fully assess due to immobilization;Unable to fully assess due to  pain       Cervical / Trunk Assessment Cervical / Trunk Assessment: Kyphotic   Communication Communication Communication: No difficulties   Cognition Arousal/Alertness: Awake/alert Behavior During Therapy: WFL for tasks assessed/performed Overall Cognitive Status: Within Functional Limits for tasks assessed                                     General Comments   pt pleasant and cooperative, sister present    Exercises Other Exercises Other Exercises: flexion/extension of R elbow as tolerated   Shoulder Instructions      Home Living Family/patient expects to be discharged to:: Private residence Living Arrangements: Alone Available Help at Discharge: Family;Friend(s);Available 24 hours/day Type of Home: Mobile home Home Access: Stairs to enter Entrance Stairs-Number of Steps: 5   Home Layout: One level     Bathroom Shower/Tub: Occupational psychologist: Standard     Home Equipment: None          Prior Functioning/Environment Level of Independence: Independent        Comments: reports her friends and sister can assist at discharge        OT Problem List: Decreased strength;Decreased range of motion;Impaired balance (sitting and/or standing);Decreased activity tolerance;Decreased coordination;Decreased knowledge of use of DME or AE;Impaired UE functional use;Pain      OT Treatment/Interventions: Self-care/ADL training;Therapeutic exercise;Neuromuscular education;DME and/or AE instruction;Therapeutic activities;Patient/family education    OT Goals(Current goals can be found in the care plan section) Acute Rehab OT Goals Patient Stated Goal: to go home OT Goal Formulation: With patient/family Time For Goal Achievement: 08/10/20 Potential to Achieve Goals: Good ADL Goals Pt Will Perform Grooming: with set-up;with supervision;sitting Pt Will Perform Upper Body Bathing: with min assist;with caregiver independent in assisting;sitting Pt  Will Perform Upper Body Dressing: with min assist;with caregiver independent in assisting;sitting Pt Will Transfer to Toilet: with min guard assist;with supervision;ambulating;stand pivot transfer;regular height toilet;bedside commode;grab bars Additional ADL Goal #1: AROM of R elbow to tolerance during ADL/selfcare tasks  OT Frequency: Min 2X/week   Barriers to D/C:            Co-evaluation              AM-PAC OT "6 Clicks" Daily Activity     Outcome Measure Help from another person eating meals?: A Little Help from another person taking care of personal grooming?: A Little Help from another person toileting, which includes using toliet, bedpan, or urinal?: Total Help from another person bathing (including washing, rinsing, drying)?: A Lot Help from another person to put on and taking off regular upper body clothing?: A Lot Help from another person to put on and taking off regular lower body clothing?: Total 6 Click Score: 12   End of Session Equipment Utilized During Treatment: Gait belt;Other (comment) (BSC)  Activity Tolerance: Patient tolerated treatment well Patient left: in bed;with call bell/phone within reach;with family/visitor present;with bed alarm set  OT Visit Diagnosis: Unsteadiness on feet (R26.81);Other abnormalities of gait and mobility (R26.89);Muscle weakness (generalized) (M62.81);History of falling (Z91.81);Pain Pain - Right/Left:  (R and L) Pain - part of body: Shoulder;Arm (R elbow, L shoulder)  Time: 1840-3754 OT Time Calculation (min): 23 min Charges:  OT General Charges $OT Visit: 1 Visit OT Evaluation $OT Eval Moderate Complexity: 1 Mod OT Treatments $Self Care/Home Management : 8-22 mins    Britt Bottom 07/27/2020, 3:10 PM

## 2020-07-27 NOTE — Progress Notes (Signed)
PROGRESS NOTE    Kathleen Blankenship  VOZ:[id number] DOB: November 28, 1950 DOA: 07/26/2020 PCP: Kathleen Harrier, MD    Brief Narrative:  Kathleen Blankenship was admitted to the hospital with the working diagnosis of closed right elbow fracture, and left proximal humerus fracture.   69 year old female with past medical history of COPD, rheumatoid arthritis, hypertension, hypothyroidism and depression who sustained a mechanical fall, she tripped and fell, landing on her elbows and shoulders.  No head trauma loss of consciousness.  On her initial physical examination blood pressure 140/90, heart rate 90, respiratory rate 18, oxygen saturation 94%, her lungs are clear to auscultation bilaterally, heart S1-S2, present rhythmic, soft abdomen, no lower extremity edema.  Radiograph of right elbow with acute, angulated fractures involving the proximal radius and ulna, without definite intra-articular extension. Radiograph left shoulder with acute comminuted displaced fracture involving both the anatomic and surgical necks of the proximal humerus with extension to the glenohumeral joint.  She was initially evaluated at [facility], transferred to [facility] for further advanced orthopedic intervention. She underwent open reduction internal fixation right elbow with radial head replacement on 12/01.   Assessment & Plan:   Principal Problem:   Closed fracture dislocation of right elbow joint, initial encounter Active Problems:   CAD (coronary artery disease)   Rheumatoid arthritis (HCC)   Closed fracture dislocation of right elbow   Closed fracture of left proximal humerus    1. Right elbow fracture (proximal radius and ulna)/ left shoulder fracture (proximal humerus). sp open reduction and internal fixation of right elbow 12/01,  Plan for surgical intervention on left shoulder on 12/4   Pain is well controlled with acetaminophen, ketorolac, methocarbamol and fentanyl. Continue  with DVT prophylaxis.   PT/OT.   2. HTN/ dyslipidemia. Continue blood pressure control with metoprolol and losartan. Continue statin therapy.   3. RA. On tramadol, mobility as tolerated. Continue with gabapentin.   4. COPD No exacerbation. As needed albuterol.   5. Hypothyroid. Clinically euthyroid. Will check TSH in am.      Status is: Inpatient  Remains inpatient appropriate because:Inpatient level of care appropriate due to severity of illness   Dispo: The patient is from: Home              Anticipated d/c is to: SNF              Anticipated d/c date is: > 3 days              Patient currently is not medically stable to d/c.   DVT prophylaxis: Enoxaparin   Code Status:   full  Family Communication:  I spoke with patient's friend at the bedside, we talked in detail about patient's condition, plan of care and prognosis and all questions were addressed.      Nutrition Status:           Skin Documentation:     Consultants:   orthopedics   Procedures:   Left elbow ORIF       Subjective: Patient seating in bed, pain is well controlled with analgesics, no nausea or vomiting, no chest pain or dyspnea.   Objective: Vitals:   07/26/20 1510 07/26/20 1951 07/26/20 2200 07/27/20 0236  BP: (!) 143/82 138/68 140/72 139/76  Pulse: 91 92 90 80  Resp: 16 16 16 15   Temp: 98.2 F (36.8 C) 98.7 F (37.1 C) 98 F (36.7 C) (!) 97.4 F (36.3 C)  TempSrc: Oral Oral Oral Oral  SpO2:  95% 94% 92% 96%  Weight:      Height:        Intake/Output Summary (Last 24 hours) at 07/27/2020 1217 Last data filed at 07/26/2020 1600 Gross per 24 hour  Intake 143.74 ml  Output --  Net 143.74 ml   Filed Weights   07/26/20 0400  Weight: 57.6 kg    Examination:   General: Not in pain or dyspnea, deconditioned  Neurology: Awake and alert, non focal  E ENT: no pallor, no icterus, oral mucosa moist Cardiovascular: No JVD. S1-S2 present, rhythmic, no gallops, rubs, or  murmurs. No lower extremity edema. Pulmonary: positive breath sounds bilaterally, adequate air movement, no wheezing, rhonchi or rales. Gastrointestinal. Abdomen soft and non tender Skin. Right arm large ecchymosis  Musculoskeletal: no joint deformities     Data Reviewed: I have personally reviewed following labs and imaging studies  CBC: Recent Labs  Lab 07/25/20 1417 07/26/20 0556 07/27/20 0233  WBC 12.9* 9.0 8.3  NEUTROABS 10.8* 7.1  --   HGB 13.7 11.9* 10.3*  HCT 40.6 36.1 31.2*  MCV 91.6 91.6 92.0  PLT 245 228 762   Basic Metabolic Panel: Recent Labs  Lab 07/25/20 1417 07/26/20 0556 07/27/20 0233  NA 137 136 137  K 5.0 4.0 3.7  CL 105 104 102  CO2 21* 23 22  GLUCOSE 101* 116* 108*  BUN 9 10 7*  CREATININE 0.63 0.65 0.64  CALCIUM 8.9 8.5* 8.5*   GFR: Estimated Creatinine Clearance: 55.7 mL/min (by C-G formula based on SCr of 0.64 mg/dL). Liver Function Tests: Recent Labs  Lab 07/26/20 0556  AST 22  ALT 12  ALKPHOS 41  BILITOT 1.0  PROT 5.4*  ALBUMIN 3.2*   No results for input(s): LIPASE, AMYLASE in the last 168 hours. No results for input(s): AMMONIA in the last 168 hours. Coagulation Profile: No results for input(s): INR, PROTIME in the last 168 hours. Cardiac Enzymes: No results for input(s): CKTOTAL, CKMB, CKMBINDEX, TROPONINI in the last 168 hours. BNP (last 3 results) No results for input(s): PROBNP in the last 8760 hours. HbA1C: No results for input(s): HGBA1C in the last 72 hours. CBG: No results for input(s): GLUCAP in the last 168 hours. Lipid Profile: No results for input(s): CHOL, HDL, LDLCALC, TRIG, CHOLHDL, LDLDIRECT in the last 72 hours. Thyroid Function Tests: No results for input(s): TSH, T4TOTAL, FREET4, T3FREE, THYROIDAB in the last 72 hours. Anemia Panel: No results for input(s): VITAMINB12, FOLATE, FERRITIN, TIBC, IRON, RETICCTPCT in the last 72 hours.    Radiology Studies: I have reviewed all of the imaging during  this hospital visit personally     Scheduled Meds: . acetaminophen  650 mg Oral Q6H  . docusate sodium  100 mg Oral BID  . enoxaparin (LOVENOX) injection  40 mg Subcutaneous Q24H  . gabapentin  100 mg Oral TID  . ketorolac  15 mg Intravenous Q6H  . metoprolol succinate  25 mg Oral Daily  . pantoprazole  40 mg Oral Daily   Continuous Infusions: . methocarbamol (ROBAXIN) IV       LOS: 1 day        Endiya Klahr Gerome Apley, MD

## 2020-07-27 NOTE — H&P (View-Only) (Signed)
ORTHOPAEDIC CONSULTATION  REQUESTING PHYSICIAN: Tawni Millers,*  Chief Complaint: Left proximal humerus fracture  HPI: Kathleen Blankenship is a 69 y.o. female with rheumatoid arthritis and a fall resulting in a complex right elbow fracture managed by Dr. Lennette Bihari Haddix and a left proximal humerus fracture, 4 part, the Dr. Doreatha Martin requested I participate in care for.  Patient's history of RA and significant displaced fracture with poor bone quality would likely preclude successful open reduction of fixation and I was asked to consider reverse shoulder arthroplasty.  Patient is doing well he sitting in her bed today comfortably her family members at the bedside.  Past Medical History:  Diagnosis Date  . Arthritis    RA  . Hypertension    Past Surgical History:  Procedure Laterality Date  . CORONARY/GRAFT ACUTE MI REVASCULARIZATION N/A 05/28/2017   Procedure: Coronary/Graft Acute MI Revascularization;  Surgeon: Isaias Cowman, MD;  Location: [facility];  Service: Cardiovascular;  Laterality: N/A;  . LEFT HEART CATH AND CORONARY ANGIOGRAPHY N/A 05/28/2017   Procedure: LEFT HEART CATH AND CORONARY ANGIOGRAPHY;  Surgeon: Isaias Cowman, MD;  Location: [facility];  Service: Cardiovascular;  Laterality: N/A;  . THYROID LOBECTOMY     Social History   Socioeconomic History  . Marital status: Divorced    Spouse name: Not on file  . Number of children: Not on file  . Years of education: Not on file  . Highest education level: Not on file  Occupational History  . Not on file  Tobacco Use  . Smoking status: Current Every Day Smoker    Packs/day: 1.00    Years: 37.00    Pack years: 37.00    Types: Cigarettes  . Smokeless tobacco: Never Used  Vaping Use  . Vaping Use: Never assessed  Substance and Sexual Activity  . Alcohol use: No  . Drug use: No  . Sexual activity: Never  Other Topics Concern  . Not on file  Social History Narrative  . Not  on file   Social Determinants of Health   Financial Resource Strain:   . Difficulty of Paying Living Expenses: Not on file  Food Insecurity:   . Worried About Charity fundraiser in the Last Year: Not on file  . Ran Out of Food in the Last Year: Not on file  Transportation Needs:   . Lack of Transportation (Medical): Not on file  . Lack of Transportation (Non-Medical): Not on file  Physical Activity:   . Days of Exercise per Week: Not on file  . Minutes of Exercise per Session: Not on file  Stress:   . Feeling of Stress : Not on file  Social Connections:   . Frequency of Communication with Friends and Family: Not on file  . Frequency of Social Gatherings with Friends and Family: Not on file  . Attends Religious Services: Not on file  . Active Member of Clubs or Organizations: Not on file  . Attends Archivist Meetings: Not on file  . Marital Status: Not on file   Family History  Family history unknown: Yes   Allergies  Allergen Reactions  . Codeine Hives  . Remicade [Infliximab] Other (See Comments)    Chest felt heavy, was given benadryl.   Prior to Admission medications   Medication Sig Start Date End Date Taking? Authorizing Provider  albuterol (PROVENTIL HFA;VENTOLIN HFA) 108 (90 Base) MCG/ACT inhaler Inhale 2 puffs into the lungs every 6 (six) hours  as needed for wheezing or shortness of breath. 05/29/17  Yes Epifanio Lesches, MD  atorvastatin (LIPITOR) 10 MG tablet Take 10 mg by mouth daily. 06/26/20  Yes [provider]  benzonatate (TESSALON) 200 MG capsule Take 200 mg by mouth daily as needed for cough. 07/04/20  Yes [provider]  hydroxychloroquine (PLAQUENIL) 200 MG tablet Take 200 mg by mouth daily.  06/26/20  Yes [provider]  leflunomide (ARAVA) 20 MG tablet Take 20 mg by mouth daily.   Yes [provider]  losartan (COZAAR) 50 MG tablet Take 50 mg by mouth daily.   Yes [provider]  metoprolol  succinate (TOPROL-XL) 50 MG 24 hr tablet Take 50 mg by mouth daily. 05/08/20  Yes [provider]  sertraline (ZOLOFT) 50 MG tablet Take 50 mg by mouth at bedtime.   Yes [provider]  traMADol (ULTRAM) 50 MG tablet Take 50 mg by mouth every 6 (six) hours as needed for moderate pain.    Yes [provider]  Upadacitinib ER (RINVOQ) 15 MG TB24 Take 15 mg by mouth daily.   Yes [provider]  vitamin B-12 (CYANOCOBALAMIN) 1000 MCG tablet Take 1,000 mcg by mouth daily.   Yes [provider]  atorvastatin (LIPITOR) 40 MG tablet Take 1 tablet (40 mg total) by mouth daily at 6 PM. Patient not taking: Reported on 07/26/2020 05/29/17   Epifanio Lesches, MD  colchicine 0.6 MG tablet Take 1 tablet (0.6 mg total) by mouth daily. 05/30/17   Epifanio Lesches, MD  inFLIXimab (REMICADE) 100 MG injection Inject 100 mg into the vein.    [provider]   DG Elbow 2 Views Right  Result Date: 07/26/2020 CLINICAL DATA:  69 year old female with ORIF of the right elbow. EXAM: RIGHT ELBOW - 2 VIEW COMPARISON:  Fluoroscopic intraoperative study dated 07/26/2020. FINDINGS: There is replacement of the radial head. Fixation plate and screws noted in the proximal ulna. The bones are osteopenic. There is no dislocation. Postsurgical changes of the soft tissues. IMPRESSION: Status post replacement of the radial head and plate and screws of the proximal ulna. Electronically Signed   By: Anner Crete M.D.   On: 07/26/2020 18:44   DG Elbow Complete Right  Result Date: 07/26/2020 CLINICAL DATA:  ORIF elbow EXAM: DG C-ARM 1-60 MIN; RIGHT ELBOW - COMPLETE 3+ VIEW FLUOROSCOPY TIME:  Fluoroscopy Time:  61.2 seconds. COMPARISON:  November 30 21. FINDINGS: Seven C-arm fluoroscopic images were obtained intraoperatively and submitted for post operative interpretation. These images demonstrate plate and screw fixation of the proximal ulna and radial head replacement. Please see  the performing provider's procedural report for further detail. IMPRESSION: Intraoperative fluoroscopic imaging, as detailed above. Electronically Signed   By: Margaretha Sheffield MD   On: 07/26/2020 11:25   DG Elbow Complete Right  Result Date: 07/25/2020 CLINICAL DATA:  Post fall, now with right elbow pain. EXAM: RIGHT ELBOW - COMPLETE 3+ VIEW COMPARISON:  None FINDINGS: Acute displaced fractures involving the proximal radius and ulna with angulation and foreshortening, apex dorsal. No definitive intra-articular extension. Expected adjacent soft tissue swelling. No radiopaque foreign body. IMPRESSION: Acute, angulated fractures involving the proximal radius and ulna without definitive intra-articular extension. Electronically Signed   By: Sandi Mariscal M.D.   On: 07/25/2020 14:13   CT Head Wo Contrast  Result Date: 07/25/2020 CLINICAL DATA:  Facial trauma after fall. EXAM: CT HEAD WITHOUT CONTRAST CT MAXILLOFACIAL WITHOUT CONTRAST TECHNIQUE: Multidetector CT imaging of the head and  maxillofacial structures were performed using the standard protocol without intravenous contrast. Multiplanar CT image reconstructions of the maxillofacial structures were also generated. COMPARISON:  None. FINDINGS: CT HEAD FINDINGS Brain: No evidence of acute infarction, hemorrhage, hydrocephalus, extra-axial collection or mass lesion/mass effect. Vascular: No hyperdense vessel or unexpected calcification. Skull: Normal. Negative for fracture or focal lesion. Other: None. CT MAXILLOFACIAL FINDINGS Osseous: No fracture or mandibular dislocation. No destructive process. Orbits: Negative. No traumatic or inflammatory finding. Sinuses: Clear. Soft tissues: Negative. IMPRESSION: 1. Normal head CT. 2. No abnormality seen in maxillofacial region. Electronically Signed   By: Marijo Conception M.D.   On: 07/25/2020 12:53   CT SHOULDER LEFT WO CONTRAST  Result Date: 07/25/2020 CLINICAL DATA:  69 year old female with fall and left  humeral neck fracture. EXAM: CT OF THE UPPER LEFT EXTREMITY WITHOUT CONTRAST TECHNIQUE: Multidetector CT imaging of the upper left extremity was performed according to the standard protocol. COMPARISON:  Radiograph dated 07/25/2020. FINDINGS: Bones/Joint/Cartilage There is a comminuted fracture of the left humeral neck and head with displaced fracture fragments. There is inferior positioning of the articular surface of the humeral head in relation to the glenoid consistent with a partial dislocation or subluxation. No other acute fracture. Ligaments Suboptimally assessed by CT. Muscles and Tendons There is intramuscular and articular edema and hematoma. No drainable fluid collection. Soft tissues Edema. No fluid collection. IMPRESSION: Comminuted fracture of the left humeral neck and head with inferior positioning of the articular surface of the humeral head in relation to the glenoid consistent with a partial dislocation or subluxation. Electronically Signed   By: Anner Crete M.D.   On: 07/25/2020 17:38   CT ELBOW RIGHT WO CONTRAST  Result Date: 07/25/2020 CLINICAL DATA:  Radial and ulnar fractures. EXAM: CT OF THE RIGHT ELBOW WITHOUT CONTRAST TECHNIQUE: Multidetector CT imaging of the RIGHT ELBOW was performed according to the standard protocol. COMPARISON:  Radiographs from 07/25/2020 FINDINGS: Bones/Joint/Cartilage The distal humerus appears intact. Comminuted fracture of the proximal ulnar metaphysis with a dominant transverse component shown on image 48 of series 10. There is also a posterolateral intermediary fragment measuring 2.3 cm as well as a lateral intermediary fragment measuring 2.1 cm. The dominant shaft fragment is laterally displaced about 9 mm with respect to the dominant proximal fragment along with some mild apex posterior angulation. There is an impacted transverse fracture of the radial head/neck with almost 90 degrees of angulation between the remaining articular fragment and the  shaft fragment as shown on image 41 of series 10. Elbow joint effusion. Ligaments Suboptimally assessed by CT. Muscles and Tendons Expected edema in musculature surrounding the fracture sites. Soft tissues Subcutaneous edema along the elbow especially posteriorly, suspected blood products or fluid in the olecranon bursa. 3D multiplanar reconstruction was performed for further depiction of the fractures. IMPRESSION: 1. Comminuted fracture of the proximal ulnar metaphysis. Mild angulation and moderate displacement. 2. Prominently angulated fracture at the junction of the radial head and neck. 3. Elbow joint effusion. 4. Subcutaneous edema along the elbow especially posteriorly, suspected blood products or fluid in the olecranon bursa. Electronically Signed   By: Van Clines M.D.   On: 07/25/2020 17:41   CT 3D RECON AT SCANNER  Result Date: 07/25/2020 : 3D reconstruction of the left shoulder was performed. Please see accession number [id number] [facility] for diagnostic report. Electronically Signed   By: Van Clines M.D.   On: 07/25/2020 17:43   CT 3D RECON AT SCANNER  Result Date: 07/25/2020 CLINICAL DATA:  Radial and ulnar fractures. EXAM: CT OF THE RIGHT ELBOW WITHOUT CONTRAST TECHNIQUE: Multidetector CT imaging of the RIGHT ELBOW was performed according to the standard protocol. COMPARISON:  Radiographs from 07/25/2020 FINDINGS: Bones/Joint/Cartilage The distal humerus appears intact. Comminuted fracture of the proximal ulnar metaphysis with a dominant transverse component shown on image 48 of series 10. There is also a posterolateral intermediary fragment measuring 2.3 cm as well as a lateral intermediary fragment measuring 2.1 cm. The dominant shaft fragment is laterally displaced about 9 mm with respect to the dominant proximal fragment along with some mild apex posterior angulation. There is an impacted transverse fracture of the radial head/neck with almost 90 degrees of angulation between the  remaining articular fragment and the shaft fragment as shown on image 41 of series 10. Elbow joint effusion. Ligaments Suboptimally assessed by CT. Muscles and Tendons Expected edema in musculature surrounding the fracture sites. Soft tissues Subcutaneous edema along the elbow especially posteriorly, suspected blood products or fluid in the olecranon bursa. 3D multiplanar reconstruction was performed for further depiction of the fractures. IMPRESSION: 1. Comminuted fracture of the proximal ulnar metaphysis. Mild angulation and moderate displacement. 2. Prominently angulated fracture at the junction of the radial head and neck. 3. Elbow joint effusion. 4. Subcutaneous edema along the elbow especially posteriorly, suspected blood products or fluid in the olecranon bursa. Electronically Signed   By: Van Clines M.D.   On: 07/25/2020 17:41   DG CHEST PORT 1 VIEW  Result Date: 07/26/2020 CLINICAL DATA:  Left shoulder fracture EXAM: PORTABLE CHEST 1 VIEW COMPARISON:  05/28/2017 FINDINGS: The lungs are symmetrically mildly hyperinflated in keeping with changes of underlying COPD. Minimal linear scarring at the left lung base. The lungs are otherwise clear. No pneumothorax or pleural effusion. Cardiac size within normal limits. Pulmonary vascularity is normal. Mild thoracic 6 point scoliosis is noted. An acute three-part fracture of the left humeral head is identified with inferior subluxation of the a articular surface of the humeral head from the glenoid fossa likely representing pseudosubluxation in the setting of a left shoulder effusion. IMPRESSION: COPD. Acute three-part fracture of the left humeral head with pseudosubluxation noted. Electronically Signed   By: Fidela Salisbury MD   On: 07/26/2020 06:19   DG Shoulder Left  Result Date: 07/25/2020 CLINICAL DATA:  Post fall, now with shoulder pain. EXAM: LEFT SHOULDER - 2+ VIEW COMPARISON:  None. FINDINGS: There is an acute comminuted fracture involving  both the anatomic and surgical necks of the proximal humerus with foreshortening and angulation, apex posterior and extension of the fracture to the glenohumeral joint. No additional fractures identified. Acromioclavicular joint spaces appear preserved. Expected adjacent soft tissue swelling.  No radiopaque foreign body Limited visualization of the adjacent thorax demonstrates atherosclerotic plaque within the aortic arch. IMPRESSION: Acute, comminuted, displaced fracture involving both the anatomic and surgical necks of the proximal humerus with extension to the glenohumeral joint. Electronically Signed   By: Sandi Mariscal M.D.   On: 07/25/2020 14:15   DG C-Arm 1-60 Min  Result Date: 07/26/2020 CLINICAL DATA:  ORIF elbow EXAM: DG C-ARM 1-60 MIN; RIGHT ELBOW - COMPLETE 3+ VIEW FLUOROSCOPY TIME:  Fluoroscopy Time:  61.2 seconds. COMPARISON:  November 30 21. FINDINGS: Seven C-arm fluoroscopic images were obtained intraoperatively and submitted for post operative interpretation. These images demonstrate plate and screw fixation of the proximal ulna and radial head replacement. Please see the performing provider's procedural report for further detail. IMPRESSION: Intraoperative fluoroscopic imaging, as detailed above. Electronically Signed  By: Margaretha Sheffield MD   On: 07/26/2020 11:25   CT Maxillofacial Wo Contrast  Result Date: 07/25/2020 CLINICAL DATA:  Facial trauma after fall. EXAM: CT HEAD WITHOUT CONTRAST CT MAXILLOFACIAL WITHOUT CONTRAST TECHNIQUE: Multidetector CT imaging of the head and maxillofacial structures were performed using the standard protocol without intravenous contrast. Multiplanar CT image reconstructions of the maxillofacial structures were also generated. COMPARISON:  None. FINDINGS: CT HEAD FINDINGS Brain: No evidence of acute infarction, hemorrhage, hydrocephalus, extra-axial collection or mass lesion/mass effect. Vascular: No hyperdense vessel or unexpected calcification. Skull:  Normal. Negative for fracture or focal lesion. Other: None. CT MAXILLOFACIAL FINDINGS Osseous: No fracture or mandibular dislocation. No destructive process. Orbits: Negative. No traumatic or inflammatory finding. Sinuses: Clear. Soft tissues: Negative. IMPRESSION: 1. Normal head CT. 2. No abnormality seen in maxillofacial region. Electronically Signed   By: Marijo Conception M.D.   On: 07/25/2020 12:53   Family History Reviewed and non-contributory, no pertinent history of problems with bleeding or anesthesia      Review of Systems 14 system ROS conducted and negative except for that noted in HPI   OBJECTIVE  Vitals: Patient Vitals for the past 8 hrs:  BP Temp Temp src Pulse Resp SpO2  07/27/20 0236 139/76 (!) 97.4 F (36.3 C) Oral 80 15 96 %   General: Alert, no acute distress Cardiovascular: Warm extremities noted Respiratory: No cyanosis, no use of accessory musculature GI: No organomegaly, abdomen is soft and non-tender Skin: No lesions in the area of chief complaint other than those listed below in MSK exam.  Neurologic: Sensation intact distally save for the below mentioned MSK exam Psychiatric: Patient is competent for consent with normal mood and affect Lymphatic: No swelling obvious and reported other than the area involved in the exam below Extremities  Left upper extremity: Sling in place, deferred range of motion setting of fracture, distal motor tendon functions intact.  Axillary nerve sensation appears to be preserved there was difficulty get the deltoid to fire in the setting of fracture.    Test Results Imaging Four-part proximal humerus fracture noted on the left with significant displacement and varus alignment.  Labs cbc Recent Labs    07/26/20 0556 07/27/20 0233  WBC 9.0 8.3  HGB 11.9* 10.3*  HCT 36.1 31.2*  PLT 228 190    Labs inflam No results for input(s): CRP in the last 72 hours.  Invalid input(s): ESR  Labs coag No results for input(s): INR,  PTT in the last 72 hours.  Invalid input(s): PT  Recent Labs    07/26/20 0556 07/27/20 0233  NA 136 137  K 4.0 3.7  CL 104 102  CO2 23 22  GLUCOSE 116* 108*  BUN 10 7*  CREATININE 0.65 0.64  CALCIUM 8.5* 8.5*     ASSESSMENT AND PLAN: 69 y.o. female with the following: Left four-part proximal humerus fracture in a patient with rheumatoid arthritis  Unfortunately patient has a complex injury.  She would benefit from surgical management the setting of a fracture that would very unlikely heal well on its own.  Open reduction internal fixation is extremely high risk of failure secondary to her comorbidities Wels the fracture pattern.  Talked with the risk benefits alternatives of a reverse shoulder arthroplasty including but not limited to infection, hardware failure, periprosthetic fracture, loss of fixation of tuberosities and dislocation.  Family elected to proceed.  We will plan for surgery first thing Saturday morning at 730.  N.p.o. midnight on Friday night.

## 2020-07-28 DIAGNOSIS — M069 Rheumatoid arthritis, unspecified: Secondary | ICD-10-CM | POA: Diagnosis not present

## 2020-07-28 DIAGNOSIS — S42202A Unspecified fracture of upper end of left humerus, initial encounter for closed fracture: Secondary | ICD-10-CM | POA: Diagnosis not present

## 2020-07-28 DIAGNOSIS — S42401A Unspecified fracture of lower end of right humerus, initial encounter for closed fracture: Secondary | ICD-10-CM | POA: Diagnosis not present

## 2020-07-28 DIAGNOSIS — I2510 Atherosclerotic heart disease of native coronary artery without angina pectoris: Secondary | ICD-10-CM | POA: Diagnosis not present

## 2020-07-28 MED ORDER — HYDROXYCHLOROQUINE SULFATE 200 MG PO TABS: 200.0000 mg | ORAL_TABLET | Freq: Every day | ORAL | Status: DC

## 2020-07-28 MED ORDER — COLCHICINE 0.6 MG PO TABS: 0.6000 mg | ORAL_TABLET | Freq: Every day | ORAL | Status: DC

## 2020-07-28 MED ORDER — LEFLUNOMIDE 20 MG PO TABS: 20.0000 mg | ORAL_TABLET | Freq: Every day | ORAL | Status: DC

## 2020-07-28 MED ORDER — UPADACITINIB ER 15 MG PO TB24: 15.0000 mg | ORAL_TABLET | Freq: Every day | ORAL | Status: DC

## 2020-07-28 MED ORDER — CEFAZOLIN SODIUM-DEXTROSE 2-4 GM/100ML-% IV SOLN
2.0000 g | INTRAVENOUS | Status: AC
  Administered 2020-07-29: 2 g via INTRAVENOUS
  Filled 2020-07-28: qty 100

## 2020-07-28 MED ORDER — METOPROLOL SUCCINATE ER 50 MG PO TB24
50.0000 mg | ORAL_TABLET | Freq: Every day | ORAL | Status: DC
  Administered 2020-07-29 – 2020-07-31 (×3): 50 mg via ORAL
  Filled 2020-07-28 (×3): qty 1

## 2020-07-28 MED ORDER — VITAMIN D 25 MCG (1000 UNIT) PO TABS
2000.0000 [IU] | ORAL_TABLET | Freq: Every day | ORAL | Status: DC
  Administered 2020-07-28 – 2020-07-31 (×4): 2000 [IU] via ORAL
  Filled 2020-07-28 (×4): qty 2

## 2020-07-28 MED ORDER — SERTRALINE HCL 50 MG PO TABS
50.0000 mg | ORAL_TABLET | Freq: Every day | ORAL | Status: DC
  Administered 2020-07-28 – 2020-07-30 (×3): 50 mg via ORAL
  Filled 2020-07-28 (×3): qty 1

## 2020-07-28 MED ORDER — BENZONATATE 100 MG PO CAPS: 200.0000 mg | ORAL_CAPSULE | Freq: Every day | ORAL | Status: DC | PRN

## 2020-07-28 MED ORDER — TRAMADOL HCL 50 MG PO TABS
50.0000 mg | ORAL_TABLET | Freq: Four times a day (QID) | ORAL | Status: DC | PRN
  Administered 2020-07-28 – 2020-07-31 (×5): 50 mg via ORAL
  Filled 2020-07-28 (×5): qty 1

## 2020-07-28 MED ORDER — VITAMIN B-12 1000 MCG PO TABS
1000.0000 ug | ORAL_TABLET | Freq: Every day | ORAL | Status: DC
  Administered 2020-07-28 – 2020-07-31 (×4): 1000 ug via ORAL
  Filled 2020-07-28 (×4): qty 1

## 2020-07-28 MED ORDER — ATORVASTATIN CALCIUM 10 MG PO TABS
10.0000 mg | ORAL_TABLET | Freq: Every day | ORAL | Status: DC
  Administered 2020-07-28 – 2020-07-31 (×4): 10 mg via ORAL
  Filled 2020-07-28 (×4): qty 1

## 2020-07-28 MED ORDER — METOPROLOL SUCCINATE ER 50 MG PO TB24: 50.0000 mg | ORAL_TABLET | Freq: Every day | ORAL | Status: DC

## 2020-07-28 NOTE — Progress Notes (Signed)
Physical Therapy Treatment Patient Details Name: Kathleen Blankenship MRN: [id number] DOB: Oct 10, 1950 Today's Date: 07/28/2020    History of Present Illness Kathleen Blankenship is an 69 y.o. female admitted after falling suffering fracutre of right elbow now s/p ORIF 07/26/2020 and fracture dislocation left proximal humerus.  PMH positive for COPD, rheumatoid arthritis, hypertension, thyroidectomy, depression, tobacco abuse.    PT Comments    Pt up in chair on arrival, agreeable to limited session, c/o fatigue and wanting to return to bed. Pt performed mobility tasks with min guard and needed Supervision for bed mobility. Pt performed seated/standing/supine BLE AROM therapeutic exercises as detailed below with good tolerance, given HEP handout (link: [email or web address] Access Code: [email or web address]), pt with fair seated balance but continues to need external assist with all OOB mobility. Per chart review, plan for L reverse TSA tomorrow AM and will need re-eval post-op. Pt continues to benefit from PT services to progress toward functional mobility goals. D/C recs below, pending progress post-op.  Follow Up Recommendations  Home health PT;Supervision/Assistance - 24 hour     Equipment Recommendations  None recommended by PT    Recommendations for Other Services       Precautions / Restrictions Precautions Precautions: Fall Restrictions Weight Bearing Restrictions: Yes RUE Weight Bearing: Non weight bearing LUE Weight Bearing: Non weight bearing Other Position/Activity Restrictions: Per MD note: can perform unrestricted AROM of R elbow as of 07/27/2020    Mobility  Bed Mobility Overal bed mobility: Needs Assistance Bed Mobility: Sit to Supine       Sit to supine: Supervision   General bed mobility comments: pt able to move OOB unassisted/SBA, cues for bridging hips to reposition in bed and pt performed with arms crossed/no difficulty  Transfers Overall transfer level: Needs  assistance Equipment used: None Transfers: Sit to/from Stand Sit to Stand: Min guard         General transfer comment: Cues for maintaining weight bearing, rocking forward, and pushing with B LEs into standing.  Ambulation/Gait Ambulation/Gait assistance: Min guard Gait Distance (Feet): 20 Feet Assistive device: None Gait Pattern/deviations: Step-through pattern;Trunk flexed Gait velocity: decreased; limited distance   General Gait Details: no LOB, limited distance in room 2/2 fatigue   Stairs             Wheelchair Mobility    Modified Rankin (Stroke Patients Only)       Balance Overall balance assessment: Needs assistance Sitting-balance support: No upper extremity supported;Feet supported Sitting balance-Leahy Scale: Fair Sitting balance - Comments: static sitting and weight shifting no LOB, SBA   Standing balance support: No upper extremity supported Standing balance-Leahy Scale: Poor Standing balance comment: not formally tested, min guard for safety with static standing and amb in room                            Cognition Arousal/Alertness: Awake/alert Behavior During Therapy: WFL for tasks assessed/performed Overall Cognitive Status: Within Functional Limits for tasks assessed                                 General Comments: pleasantly cooperative      Exercises General Exercises - Lower Extremity Ankle Circles/Pumps: AROM;Strengthening;Both;10 reps;Seated Long Arc Quad: AROM;Strengthening;Both;10 reps;Seated Heel Slides: AROM;Strengthening;Both;10 reps;Supine Hip ABduction/ADduction: AROM;Strengthening;Both;10 reps;Supine Straight Leg Raises: AROM;Strengthening;Both;10 reps;Supine Hip Flexion/Marching: AROM;Strengthening;Both;10 reps;Seated Mini-Sqauts: AROM;Strengthening;10 reps;Standing;Both Other Exercises Other Exercises: IS x 5 reps.  quality ~1055mL; cues for technique; flexion/extension of R elbow as tolerated x5  reps during session    General Comments General comments (skin integrity, edema, etc.): sister present in room, helpful; BP elevated seated/standing) taken in L calf, improved with return to supine       Pertinent Vitals/Pain Pain Assessment: 0-10 Pain Score: 5  Pain Location: L shoulder and  R elbow Pain Descriptors / Indicators: Operative site guarding;Aching Pain Intervention(s): Monitored during session;Premedicated before session;Repositioned    Home Living                      Prior Function            PT Goals (current goals can now be found in the care plan section) Acute Rehab PT Goals Patient Stated Goal: to go home PT Goal Formulation: With patient Time For Goal Achievement: 08/09/20 Potential to Achieve Goals: Good Progress towards PT goals: Progressing toward goals    Frequency    Min 5X/week      PT Plan Current plan remains appropriate    Co-evaluation              AM-PAC PT "6 Clicks" Mobility   Outcome Measure  Help needed turning from your back to your side while in a flat bed without using bedrails?: A Little Help needed moving from lying on your back to sitting on the side of a flat bed without using bedrails?: A Little Help needed moving to and from a bed to a chair (including a wheelchair)?: A Little Help needed standing up from a chair using your arms (e.g., wheelchair or bedside chair)?: A Little Help needed to walk in hospital room?: A Little Help needed climbing 3-5 steps with a railing? : A Little 6 Click Score: 18    End of Session Equipment Utilized During Treatment: Gait belt;Other (comment) (LUE sling) Activity Tolerance: Patient limited by fatigue Patient left: in bed;with call bell/phone within reach;with family/visitor present Nurse Communication: Mobility status PT Visit Diagnosis: Other abnormalities of gait and mobility (R26.89)     Time: 4707-6151 PT Time Calculation (min) (ACUTE ONLY): 23 min  Charges:   $Therapeutic Exercise: 8-22 mins $Therapeutic Activity: 8-22 mins                     Kathleen Bas P., PTA Acute Rehabilitation Services Pager: [phone] Office: [place] 07/28/2020, 2:50 PM

## 2020-07-28 NOTE — Progress Notes (Addendum)
PROGRESS NOTE    Kathleen Blankenship  ACZ:[id number] DOB: [birth year] DOA: 07/26/2020 PCP: Tracie Harrier, MD    Brief Narrative:  Kathleen Blankenship admitted to the hospital with the working diagnosis of closed right elbow fracture, andleft proximal humerus fracture.  68 year old female with past medical history of COPD, rheumatoid arthritis, hypertension, hypothyroidism and depression who sustained a mechanical fall, she tripped and fell, landing on her elbows and shoulders. No head trauma loss of consciousness. On her initial physical examination blood pressure 140/90, heart rate 90, respiratory rate 18, oxygen saturation 94%, her lungs are clear to auscultation bilaterally, heart S1-S2, present rhythmic, soft abdomen, no lower extremity edema.  Radiograph of right elbow with acute, angulated fractures involving the proximal radius and ulna, without definite intra-articular extension. Radiograph left shoulder with acute comminuted displaced fracture involving both the anatomic and surgical necks of the proximal humerus with extension to the glenohumeral joint.  She was initially evaluated at [facility], transferred to [facility] for further advanced orthopedic intervention. She underwent open reduction internal fixation right elbow with radial head replacement on 12/01.  Plan for OR on 12/04 for left shoulder intervention.    Assessment & Plan:   Principal Problem:   Closed fracture dislocation of right elbow joint, initial encounter Active Problems:   CAD (coronary artery disease)   Rheumatoid arthritis (HCC)   Closed fracture dislocation of right elbow   Closed fracture of left proximal humerus    1. Right elbow fracture (proximal radius and ulna)/ left shoulder fracture (proximal humerus). sp open reduction and internal fixation of right elbow 12/01,  Pain and mobility of right elbow has improved.    Continue with acetaminophen, ketorolac,  methocarbamol and fentanyl for pain control.   DVT prophylaxis, PT and OT, recommended home health services. Plan for surgical intervention in am to left shoulder.  2. HTN/ dyslipidemia. On metoprolol (resume home dose of 50 mg) and losartan for blood pressure control, today 148/73 mmHg.  On statin therapy, atorvastatin.   3. RA/ gout. Continue with tramadol and gabapentin.  Resume colchicine.  At home on infliximab, upadacitinib, hydroxychloroquine currently on hold per rheumatology recommendations.   4. COPD On as needed albuterol, no signs of acute exacerbation.    5. Hypothyroid. Clinically euthyroid. Check TSH in am. Patient not on levothyroxine.    Status is: Inpatient  Remains inpatient appropriate because:Inpatient level of care appropriate due to severity of illness   Dispo: The patient is from: Home              Anticipated d/c is to: Home              Anticipated d/c date is: 3 days              Patient currently is not medically stable to d/c.   DVT prophylaxis: enoxparin   Code Status:   full  Family Communication:  I spoke with patient's sister at the bedside, we talked in detail about patient's condition, plan of care and prognosis and all questions were addressed.    Consultants:   orthopedics   Procedures:   Left elbow ORIF    Subjective: Patient with improved pain and mobility of right elbow, no nausea or vomiting, no dyspnea or chest pain.   Objective: Vitals:   07/27/20 0236 07/27/20 1900 07/28/20 0300 07/28/20 0751  BP: 139/76 (!) 152/66 (!) 148/73 136/74  Pulse: 80 88 82 80  Resp: 15 18 17 18   Temp: Marland Kitchen)  97.4 F (36.3 C) 98.7 F (37.1 C) 98.1 F (36.7 C) 97.8 F (36.6 C)  TempSrc: Oral Oral Oral Oral  SpO2: 96% 93% 97% 98%  Weight:      Height:        Intake/Output Summary (Last 24 hours) at 07/28/2020 1111 Last data filed at 07/28/2020 0855 Gross per 24 hour  Intake 240 ml  Output --  Net 240 ml   Filed Weights    07/26/20 0400  Weight: 57.6 kg    Examination:   General: Not in pain or dyspnea.  Neurology: Awake and alert, non focal  E ENT: no pallor, no icterus, oral mucosa moist Cardiovascular: No JVD. S1-S2 present, rhythmic, no gallops, rubs, or murmurs. No lower extremity edema. Pulmonary: positive breath sounds bilaterally, adequate air movement, no wheezing, rhonchi or rales. Gastrointestinal. Abdomen soft and non tender Skin. Bilateral upper extremities, ecchymosis.  Musculoskeletal: edema and ecchymosis on right elbow, no limited mobility. Left shoulder with local tenderness, sling in place.      Data Reviewed: I have personally reviewed following labs and imaging studies  CBC: Recent Labs  Lab 07/25/20 1417 07/26/20 0556 07/27/20 0233  WBC 12.9* 9.0 8.3  NEUTROABS 10.8* 7.1  --   HGB 13.7 11.9* 10.3*  HCT 40.6 36.1 31.2*  MCV 91.6 91.6 92.0  PLT 245 228 093   Basic Metabolic Panel: Recent Labs  Lab 07/25/20 1417 07/26/20 0556 07/27/20 0233  NA 137 136 137  K 5.0 4.0 3.7  CL 105 104 102  CO2 21* 23 22  GLUCOSE 101* 116* 108*  BUN 9 10 7*  CREATININE 0.63 0.65 0.64  CALCIUM 8.9 8.5* 8.5*   GFR: Estimated Creatinine Clearance: 55.7 mL/min (by C-G formula based on SCr of 0.64 mg/dL). Liver Function Tests: Recent Labs  Lab 07/26/20 0556  AST 22  ALT 12  ALKPHOS 41  BILITOT 1.0  PROT 5.4*  ALBUMIN 3.2*   No results for input(s): LIPASE, AMYLASE in the last 168 hours. No results for input(s): AMMONIA in the last 168 hours. Coagulation Profile: No results for input(s): INR, PROTIME in the last 168 hours. Cardiac Enzymes: No results for input(s): CKTOTAL, CKMB, CKMBINDEX, TROPONINI in the last 168 hours. BNP (last 3 results) No results for input(s): PROBNP in the last 8760 hours. HbA1C: No results for input(s): HGBA1C in the last 72 hours. CBG: No results for input(s): GLUCAP in the last 168 hours. Lipid Profile: No results for input(s): CHOL, HDL,  LDLCALC, TRIG, CHOLHDL, LDLDIRECT in the last 72 hours. Thyroid Function Tests: No results for input(s): TSH, T4TOTAL, FREET4, T3FREE, THYROIDAB in the last 72 hours. Anemia Panel: No results for input(s): VITAMINB12, FOLATE, FERRITIN, TIBC, IRON, RETICCTPCT in the last 72 hours.    Radiology Studies: I have reviewed all of the imaging during this hospital visit personally     Scheduled Meds: . acetaminophen  650 mg Oral Q6H  . docusate sodium  100 mg Oral BID  . gabapentin  100 mg Oral TID  . ketorolac  15 mg Intravenous Q6H  . metoprolol succinate  25 mg Oral Daily  . pantoprazole  40 mg Oral Daily   Continuous Infusions: . [START ON 07/29/2020]  ceFAZolin (ANCEF) IV    . methocarbamol (ROBAXIN) IV       LOS: 2 days        Dare Spillman Gerome Apley, MD

## 2020-07-28 NOTE — Progress Notes (Signed)
Orthopaedic Trauma Progress Note  SUBJECTIVE: Reports mild pain about operative site, improved from yesterday. Moving her elbow well without significant pain. No chest pain. No SOB. No nausea/vomiting. No other complaints. Has not been eating a whole lot but states she is drinking lots of fluids.  Last BM on Tuesday evening but patient states she doesn't normally go on a daily basis.   OBJECTIVE:  Vitals:   07/28/20 0300 07/28/20 0751  BP: (!) 148/73 136/74  Pulse: 82 80  Resp: 17 18  Temp: 98.1 F (36.7 C) 97.8 F (36.6 C)  SpO2: 97% 98%    General: Sitting up in bed, NAD Respiratory: No increased work of breathing.  Right Upper Extremity: Dressing removed, incision CDI. Bruising and mild swelling about the elbow. Excellent elbow motion. Motor and sensory function intact in median, ulnar, radial nerve distribution. Compartments soft and compressible. +radial pulse.   IMAGING: Stable post op imaging.  LABS:  No results found for this or any previous visit (from the past 24 hour(s)).  ASSESSMENT: Kathleen Blankenship is a 69 y.o. female, 2 Days Post-Op  Injuries: 1. Right [place] fracture dislocation of proximal ulnar/radial head s/p ORIF elbow with radial head replacement 2. Left 4-part proximal humerus fracture with scheduled procedure 07/29/20  CV/Blood loss: Acute blood loss anemia, Hgb 10.3 yesterday morning. Hemodynamically stable. Have scheduled repeat CBC prior to surgery on 12/4  PLAN: Weightbearing: NWB RUE and LUE Incisional and dressing care: Ok to leave right elbow incision open to air Showering: Ok to shower with assistance. Orthopedic device(s): Sling LUE, sling RUE for comfort Pain management:  1. Tylenol 650 mg q 6 hours scheduled 2. Robaxin 500 mg q 6 hours PRN 3. Tramadol 50-100 mg q 6 hours PRN 4. Neurontin 100 mg TID 5. Fentanyl 25 mcg q 2 hours PRN 6. Toradol 15 mg q 6 hours x 5 doses VTE prophylaxis: Lovenox, SCDs ID:  Ancef 2gm post op  completed Foley/Lines:  No foley, KVO IVFs Impediments to Fracture Healing: Vit D level 17, start on D3 supplementation Dispo: PT/OT eval, currently recommending HH. Patient scheduled for shoulder surgery tomorrow with Dr. Griffin Basil. Will need to be NPO after midnight  Follow - up plan: 2 weeks  Contact information:  Katha Hamming MD, Patrecia Pace PA-C. After hours and holidays please check [email or web address] for group call information for [facility]   Mareon Robinette A. Ricci Barker, PA-C [phone] (office) [email or web address]

## 2020-07-29 ENCOUNTER — Inpatient Hospital Stay (HOSPITAL_COMMUNITY): Payer: Medicare HMO

## 2020-07-29 ENCOUNTER — Inpatient Hospital Stay (HOSPITAL_COMMUNITY): Payer: Medicare HMO | Admitting: Certified Registered Nurse Anesthetist

## 2020-07-29 ENCOUNTER — Encounter (HOSPITAL_COMMUNITY)
Admission: RE | Disposition: A | Discharge status: Home Health | Payer: Self-pay | Source: Other Acute Inpatient Hospital | Attending: Internal Medicine

## 2020-07-29 ENCOUNTER — Inpatient Hospital Stay (HOSPITAL_COMMUNITY): Admission: RE | Admit: 2020-07-29 | Payer: Medicare HMO | Source: Home / Self Care | Admitting: Orthopaedic Surgery

## 2020-07-29 DIAGNOSIS — S42401A Unspecified fracture of lower end of right humerus, initial encounter for closed fracture: Secondary | ICD-10-CM | POA: Diagnosis not present

## 2020-07-29 DIAGNOSIS — S42202A Unspecified fracture of upper end of left humerus, initial encounter for closed fracture: Secondary | ICD-10-CM | POA: Diagnosis not present

## 2020-07-29 DIAGNOSIS — M069 Rheumatoid arthritis, unspecified: Secondary | ICD-10-CM | POA: Diagnosis not present

## 2020-07-29 DIAGNOSIS — I2510 Atherosclerotic heart disease of native coronary artery without angina pectoris: Secondary | ICD-10-CM | POA: Diagnosis not present

## 2020-07-29 HISTORY — PX: REVERSE SHOULDER ARTHROPLASTY: SHX5054

## 2020-07-29 LAB — BASIC METABOLIC PANEL
Anion gap: 11 (ref 5–15)
BUN: 9 mg/dL (ref 8–23)
CO2: 25 mmol/L (ref 22–32)
Calcium: 8.5 mg/dL — ABNORMAL LOW (ref 8.9–10.3)
Chloride: 103 mmol/L (ref 98–111)
Creatinine, Ser: 0.89 mg/dL (ref 0.44–1.00)
GFR, Estimated: >60 mL/min (ref >60)
Glucose, Bld: 107 mg/dL — ABNORMAL HIGH (ref 70–99)
Potassium: 3.6 mmol/L (ref 3.5–5.1)
Sodium: 139 mmol/L (ref 135–145)

## 2020-07-29 LAB — CBC WITH DIFFERENTIAL/PLATELET
Abs Immature Granulocytes: 0.02 10*3/uL (ref 0.00–0.07)
Basophils Absolute: 0 10*3/uL (ref 0.0–0.1)
Basophils Relative: 0 %
Eosinophils Absolute: 0.3 10*3/uL (ref 0.0–0.5)
Eosinophils Relative: 4 %
HCT: 27.7 % — ABNORMAL LOW (ref 36.0–46.0)
Hemoglobin: 9.6 g/dL — ABNORMAL LOW (ref 12.0–15.0)
Immature Granulocytes: 0 %
Lymphocytes Relative: 16 %
Lymphs Abs: 1 10*3/uL (ref 0.7–4.0)
MCH: 31.1 pg (ref 26.0–34.0)
MCHC: 34.7 g/dL (ref 30.0–36.0)
MCV: 89.6 fL (ref 80.0–100.0)
Monocytes Absolute: 0.9 10*3/uL (ref 0.1–1.0)
Monocytes Relative: 14 %
Neutro Abs: 4.2 10*3/uL (ref 1.7–7.7)
Neutrophils Relative %: 66 %
Platelets: 204 10*3/uL (ref 150–400)
RBC: 3.09 MIL/uL — ABNORMAL LOW (ref 3.87–5.11)
RDW: 15.5 % (ref 11.5–15.5)
WBC: 6.5 10*3/uL (ref 4.0–10.5)
nRBC: 0 % (ref 0.0–0.2)

## 2020-07-29 LAB — TSH: TSH: 0.81 u[IU]/mL (ref 0.350–4.500)

## 2020-07-29 IMAGING — DX DG SHOULDER 1V*L*
1 series · 1 of 1 positions shown · non-contrast
Comparison: Films from earlier in the same day.

CLINICAL DATA: Status post left shoulder arthroplasty

EXAM:
LEFT SHOULDER

[shoulder obl]
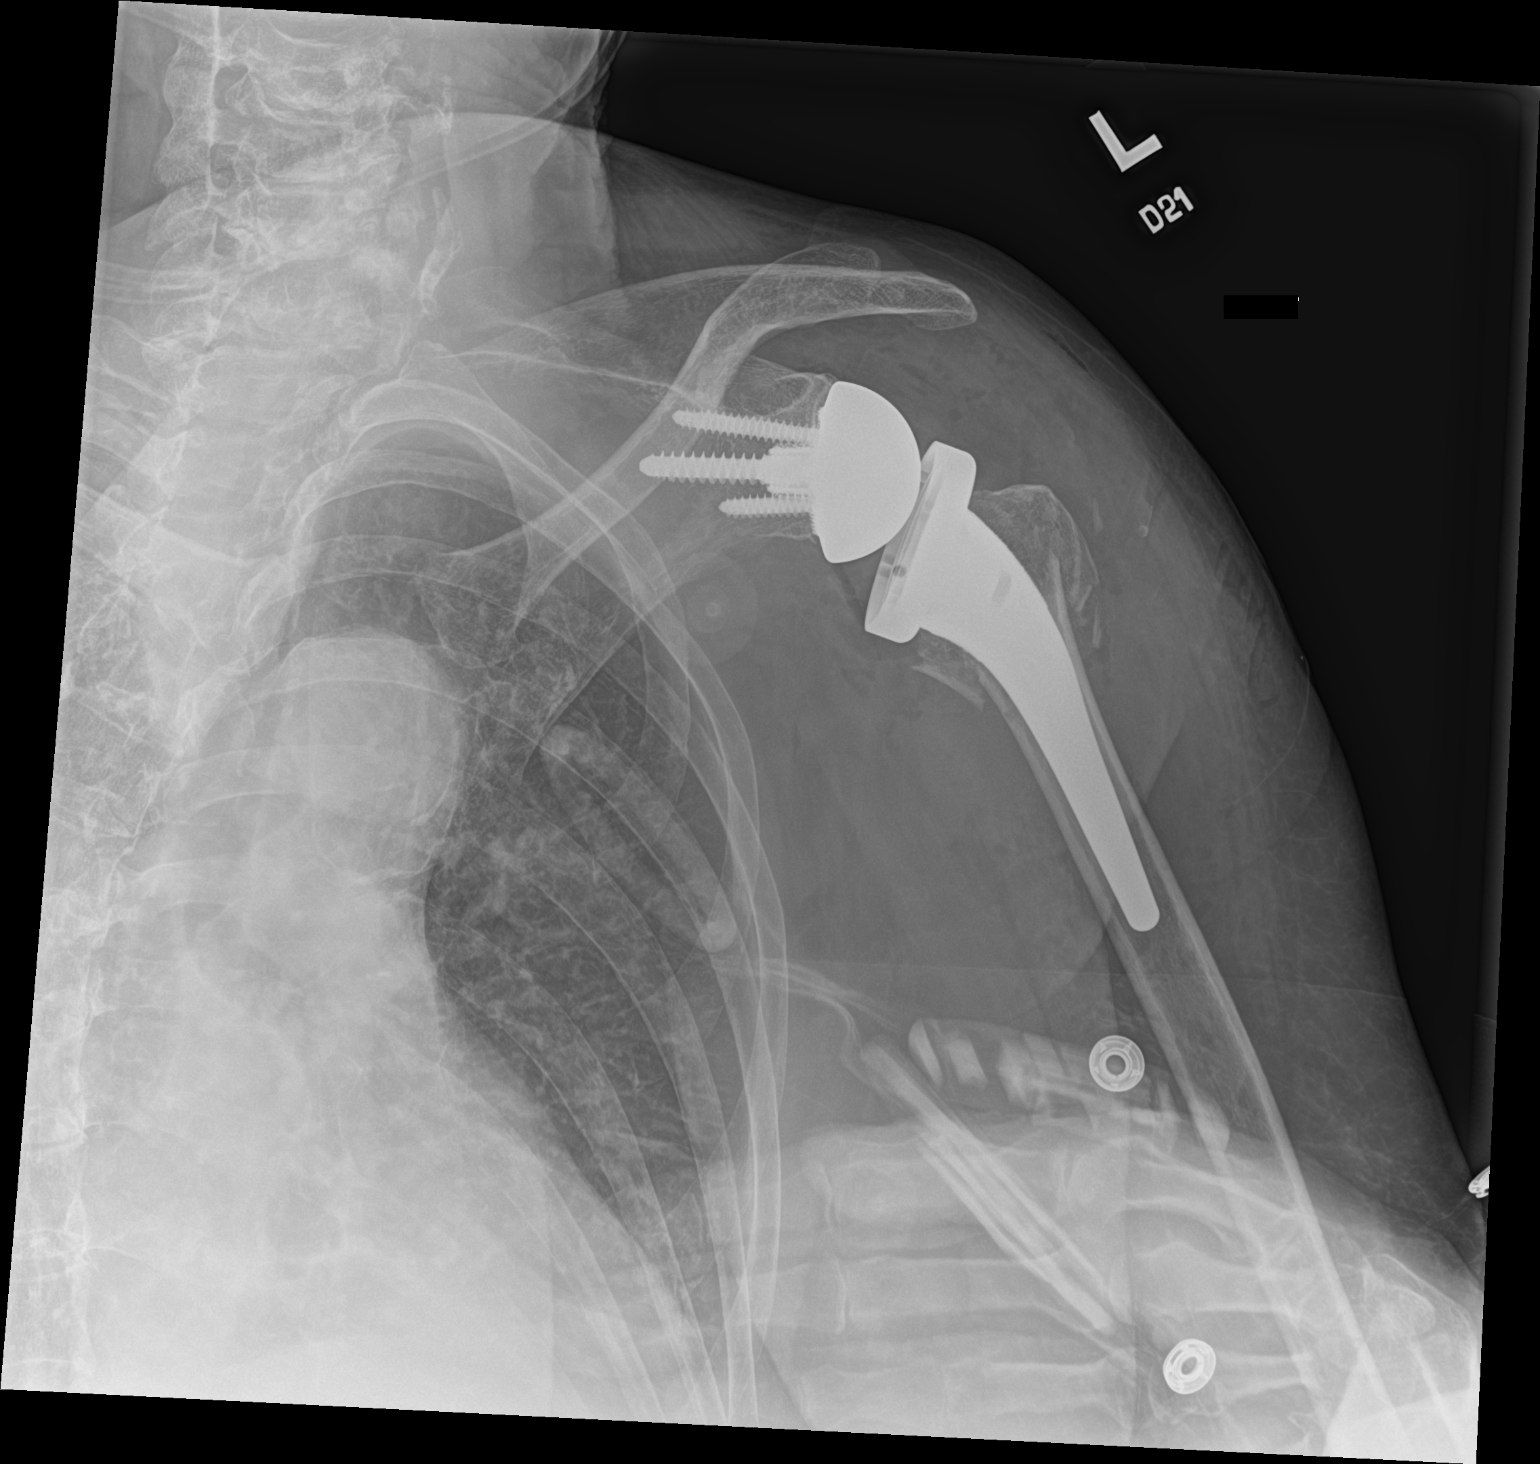

[1 of 1 positions shown; findings below may reference images not displayed]

FINDINGS: Left shoulder arthroplasty is noted. Fragments are noted along the
proximal humerus related to the prior fracture. No soft tissue
abnormality is noted.
IMPRESSION: Status post left shoulder arthroplasty.

## 2020-07-29 IMAGING — RF DG SHOULDER 1V*L*
1 series · 1 of 1 positions shown · non-contrast
Comparison: None.

CLINICAL DATA: Portable imaging for left shoulder arthroplasty.

EXAM:
DG C-ARM 1-60 MIN; LEFT SHOULDER: ONE VIEW

[Series 1: run · 1 of 1 slices shown]
[im 1/1]
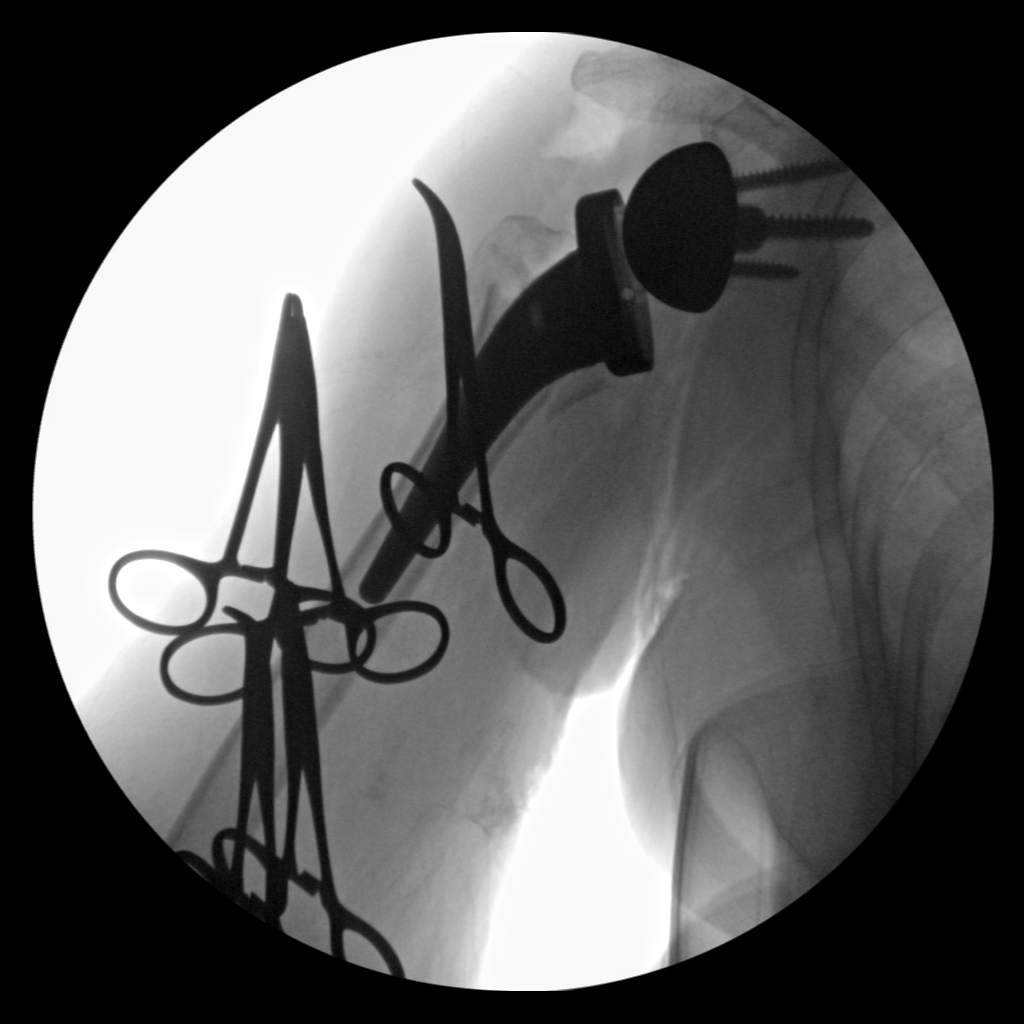

[1 of 1 positions shown; findings below may reference images not displayed]

FINDINGS: Single view shows a left shoulder arthroplasty, reverse
configuration, with the prosthetic components well seated and
aligned.
IMPRESSION: Portable imaging for left shoulder arthroplasty.

## 2020-07-29 IMAGING — RF DG C-ARM 1-60 MIN
1 series · 1 of 1 positions shown · non-contrast
Comparison: None.

CLINICAL DATA: Portable imaging for left shoulder arthroplasty.

EXAM:
DG C-ARM 1-60 MIN; LEFT SHOULDER: ONE VIEW

[Series 1: run · 1 of 1 slices shown]
[im 1/1]
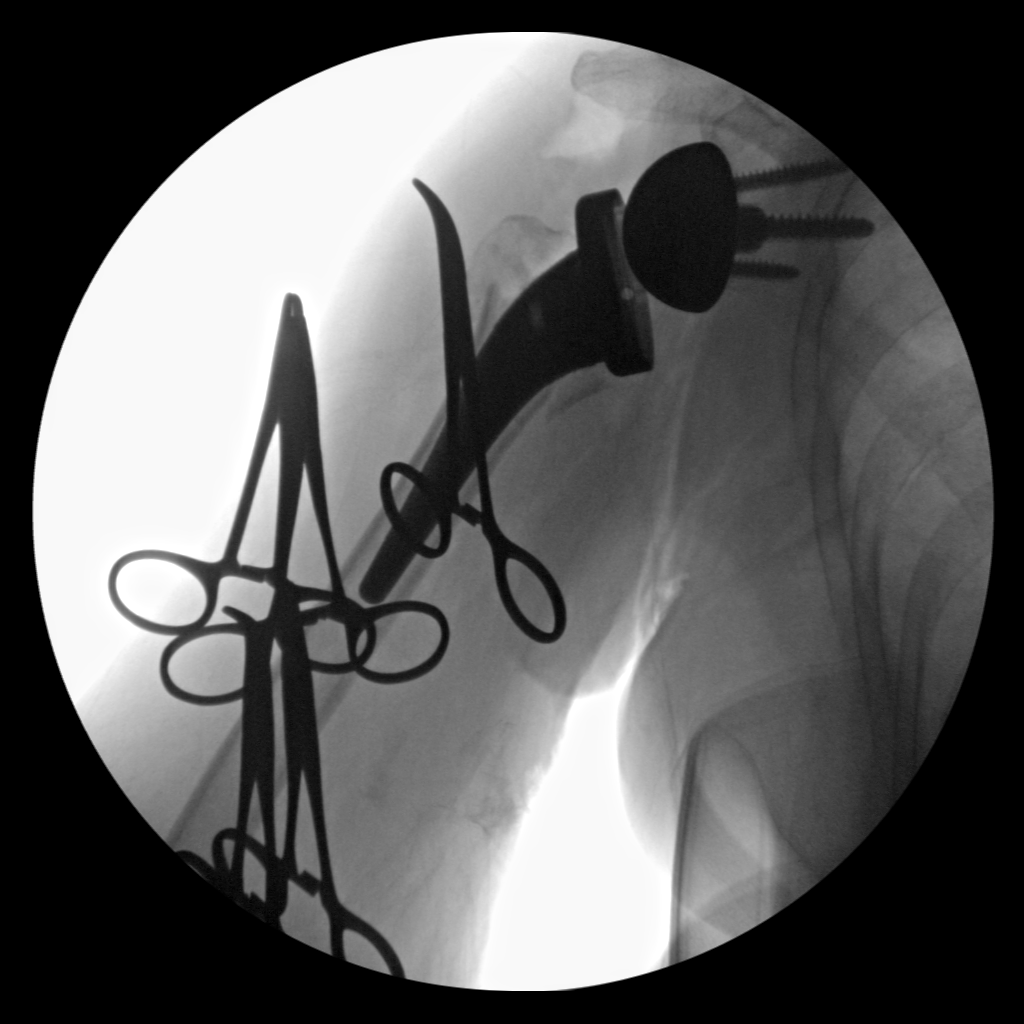

[1 of 1 positions shown; findings below may reference images not displayed]

FINDINGS: Single view shows a left shoulder arthroplasty, reverse
configuration, with the prosthetic components well seated and
aligned.
IMPRESSION: Portable imaging for left shoulder arthroplasty.

## 2020-07-29 SURGERY — ARTHROPLASTY, SHOULDER, TOTAL, REVERSE
Anesthesia: Regional | Site: Shoulder | Laterality: Left

## 2020-07-29 MED ORDER — PHENYLEPHRINE HCL-NACL 10-0.9 MG/250ML-% IV SOLN
INTRAVENOUS | Status: DC | PRN
  Administered 2020-07-29: 25 ug/min via INTRAVENOUS

## 2020-07-29 MED ORDER — MAGNESIUM CITRATE PO SOLN: 1.0000 | Freq: Once | ORAL | Status: DC | PRN

## 2020-07-29 MED ORDER — WITCH HAZEL-GLYCERIN EX PADS
MEDICATED_PAD | CUTANEOUS | Status: DC | PRN
  Filled 2020-07-29: qty 100

## 2020-07-29 MED ORDER — BISACODYL 5 MG PO TBEC: 5.0000 mg | DELAYED_RELEASE_TABLET | Freq: Every day | ORAL | Status: DC | PRN

## 2020-07-29 MED ORDER — MIDAZOLAM HCL 2 MG/2ML IJ SOLN
INTRAMUSCULAR | Status: DC | PRN
  Administered 2020-07-29 (×2): 1 mg via INTRAVENOUS

## 2020-07-29 MED ORDER — FENTANYL CITRATE (PF) 100 MCG/2ML IJ SOLN: 25.0000 ug | INTRAMUSCULAR | Status: DC | PRN

## 2020-07-29 MED ORDER — CHLORHEXIDINE GLUCONATE 0.12 % MT SOLN: 15.0000 mL | Freq: Once | OROMUCOSAL | Status: AC

## 2020-07-29 MED ORDER — LOSARTAN POTASSIUM 50 MG PO TABS
50.0000 mg | ORAL_TABLET | Freq: Every day | ORAL | Status: DC
  Administered 2020-07-29 – 2020-07-31 (×3): 50 mg via ORAL
  Filled 2020-07-29 (×3): qty 1

## 2020-07-29 MED ORDER — ONDANSETRON HCL 4 MG/2ML IJ SOLN: 4.0000 mg | Freq: Four times a day (QID) | INTRAMUSCULAR | Status: DC | PRN

## 2020-07-29 MED ORDER — 0.9 % SODIUM CHLORIDE (POUR BTL) OPTIME
TOPICAL | Status: DC | PRN
  Administered 2020-07-29: 2000 mL

## 2020-07-29 MED ORDER — CEFAZOLIN SODIUM-DEXTROSE 1-4 GM/50ML-% IV SOLN
1.0000 g | Freq: Four times a day (QID) | INTRAVENOUS | Status: AC
  Administered 2020-07-29 – 2020-07-30 (×3): 1 g via INTRAVENOUS
  Filled 2020-07-29 (×3): qty 50

## 2020-07-29 MED ORDER — SUGAMMADEX SODIUM 200 MG/2ML IV SOLN
INTRAVENOUS | Status: DC | PRN
  Administered 2020-07-29: 200 mg via INTRAVENOUS

## 2020-07-29 MED ORDER — CHLORHEXIDINE GLUCONATE 0.12 % MT SOLN
OROMUCOSAL | Status: AC
  Administered 2020-07-29: 15 mL via OROMUCOSAL
  Filled 2020-07-29: qty 15

## 2020-07-29 MED ORDER — POLYETHYLENE GLYCOL 3350 17 G PO PACK: 17.0000 g | PACK | Freq: Every day | ORAL | Status: DC | PRN

## 2020-07-29 MED ORDER — VANCOMYCIN HCL 1000 MG IV SOLR
INTRAVENOUS | Status: AC
  Filled 2020-07-29: qty 1000

## 2020-07-29 MED ORDER — BUPIVACAINE LIPOSOME 1.3 % IJ SUSP
INTRAMUSCULAR | Status: DC | PRN
  Administered 2020-07-29: 10 mL via PERINEURAL

## 2020-07-29 MED ORDER — METOCLOPRAMIDE HCL 5 MG PO TABS: 5.0000 mg | ORAL_TABLET | Freq: Three times a day (TID) | ORAL | Status: DC | PRN

## 2020-07-29 MED ORDER — ROCURONIUM BROMIDE 10 MG/ML (PF) SYRINGE
PREFILLED_SYRINGE | INTRAVENOUS | Status: DC | PRN
  Administered 2020-07-29: 40 mg via INTRAVENOUS

## 2020-07-29 MED ORDER — DOCUSATE SODIUM 100 MG PO CAPS
100.0000 mg | ORAL_CAPSULE | Freq: Two times a day (BID) | ORAL | Status: DC
  Administered 2020-07-29 – 2020-07-31 (×5): 100 mg via ORAL
  Filled 2020-07-29 (×5): qty 1

## 2020-07-29 MED ORDER — BUPIVACAINE HCL (PF) 0.5 % IJ SOLN
INTRAMUSCULAR | Status: DC | PRN
  Administered 2020-07-29: 15 mL via PERINEURAL

## 2020-07-29 MED ORDER — ONDANSETRON HCL 4 MG/2ML IJ SOLN
INTRAMUSCULAR | Status: DC | PRN
  Administered 2020-07-29: 4 mg via INTRAVENOUS

## 2020-07-29 MED ORDER — FENTANYL CITRATE (PF) 250 MCG/5ML IJ SOLN
INTRAMUSCULAR | Status: DC | PRN
  Administered 2020-07-29 (×3): 50 ug via INTRAVENOUS

## 2020-07-29 MED ORDER — VANCOMYCIN HCL 1000 MG IV SOLR
INTRAVENOUS | Status: DC | PRN
  Administered 2020-07-29: 1000 mg via TOPICAL

## 2020-07-29 MED ORDER — ONDANSETRON HCL 4 MG PO TABS: 4.0000 mg | ORAL_TABLET | Freq: Four times a day (QID) | ORAL | Status: DC | PRN

## 2020-07-29 MED ORDER — ONDANSETRON HCL 4 MG/2ML IJ SOLN: 4.0000 mg | Freq: Once | INTRAMUSCULAR | Status: DC | PRN

## 2020-07-29 MED ORDER — LACTATED RINGERS IV SOLN: INTRAVENOUS | Status: DC

## 2020-07-29 MED ORDER — DEXAMETHASONE SODIUM PHOSPHATE 10 MG/ML IJ SOLN
INTRAMUSCULAR | Status: DC | PRN
  Administered 2020-07-29: 10 mg via INTRAVENOUS

## 2020-07-29 MED ORDER — METOCLOPRAMIDE HCL 5 MG/ML IJ SOLN: 5.0000 mg | Freq: Three times a day (TID) | INTRAMUSCULAR | Status: DC | PRN

## 2020-07-29 MED ORDER — ACETAMINOPHEN 10 MG/ML IV SOLN: 1000.0000 mg | Freq: Once | INTRAVENOUS | Status: DC | PRN

## 2020-07-29 MED ORDER — SODIUM CHLORIDE 0.9 % IR SOLN
Status: DC | PRN
  Administered 2020-07-29: 3000 mL

## 2020-07-29 MED ORDER — PHENYLEPHRINE HCL (PRESSORS) 10 MG/ML IV SOLN
INTRAVENOUS | Status: DC | PRN
  Administered 2020-07-29 (×2): 80 ug via INTRAVENOUS
  Administered 2020-07-29: 40 ug via INTRAVENOUS

## 2020-07-29 MED ORDER — PROPOFOL 10 MG/ML IV BOLUS
INTRAVENOUS | Status: DC | PRN
  Administered 2020-07-29: 100 mg via INTRAVENOUS

## 2020-07-29 MED ORDER — CELECOXIB 100 MG PO CAPS
100.0000 mg | ORAL_CAPSULE | Freq: Two times a day (BID) | ORAL | Status: DC
  Administered 2020-07-29 – 2020-07-31 (×5): 100 mg via ORAL
  Filled 2020-07-29 (×7): qty 1

## 2020-07-29 SURGICAL SUPPLY — 65 items
BASEPLATE GLENOSPHERE 25 STD (Miscellaneous) ×2 IMPLANT
BIT DRILL 3.2 PERIPHERAL SCREW (BIT) ×2 IMPLANT
BLADE SAW SAG 73X25 THK (BLADE) ×1
BLADE SAW SGTL 73X25 THK (BLADE) ×1 IMPLANT
CHLORAPREP W/TINT 26 (MISCELLANEOUS) ×4 IMPLANT
CLSR STERI-STRIP ANTIMIC 1/2X4 (GAUZE/BANDAGES/DRESSINGS) ×2 IMPLANT
COVER SURGICAL LIGHT HANDLE (MISCELLANEOUS) ×2 IMPLANT
DRAPE C-ARM 42X72 X-RAY (DRAPES) ×2 IMPLANT
DRAPE HALF SHEET 40X57 (DRAPES) ×2 IMPLANT
DRAPE INCISE IOBAN 66X45 STRL (DRAPES) ×4 IMPLANT
DRAPE ORTHO SPLIT 77X108 STRL (DRAPES) ×2
DRAPE SURG ORHT 6 SPLT 77X108 (DRAPES) ×2 IMPLANT
DRAPE SWITCH (DRAPES) ×2 IMPLANT
DRAPE U-SHAPE 47X51 STRL (DRAPES) IMPLANT
DRSG AQUACEL AG ADV 3.5X 6 (GAUZE/BANDAGES/DRESSINGS) ×2 IMPLANT
ELECT BLADE 4.0 EZ CLEAN MEGAD (MISCELLANEOUS) ×2
ELECT REM PT RETURN 9FT ADLT (ELECTROSURGICAL) ×2
ELECTRODE BLDE 4.0 EZ CLN MEGD (MISCELLANEOUS) ×1 IMPLANT
ELECTRODE REM PT RTRN 9FT ADLT (ELECTROSURGICAL) ×1 IMPLANT
GLENOSPHERE REV SHOULDER 36 (Joint) ×2 IMPLANT
GLOVE BIO SURGEON STRL SZ 6.5 (GLOVE) ×8 IMPLANT
GLOVE BIOGEL PI IND STRL 8 (GLOVE) ×3 IMPLANT
GLOVE BIOGEL PI INDICATOR 8 (GLOVE) ×3
GLOVE ECLIPSE 8.0 STRL XLNG CF (GLOVE) ×4 IMPLANT
GLOVE INDICATOR 6.5 STRL GRN (GLOVE) ×2 IMPLANT
GOWN STRL REUS W/ TWL LRG LVL3 (GOWN DISPOSABLE) ×3 IMPLANT
GOWN STRL REUS W/ TWL XL LVL3 (GOWN DISPOSABLE) IMPLANT
GOWN STRL REUS W/TWL LRG LVL3 (GOWN DISPOSABLE) ×3
GOWN STRL REUS W/TWL XL LVL3 (GOWN DISPOSABLE)
GUIDEWIRE GLENOID 2.5X220 (WIRE) ×2 IMPLANT
HANDPIECE INTERPULSE COAX TIP (DISPOSABLE) ×1
IMPL REVERSE SHOULDER 0X3.5 (Shoulder) ×1 IMPLANT
IMPLANT REVERSE SHOULDER 0X3.5 (Shoulder) ×2 IMPLANT
INSERT HUMERAL 36X6MM 12.5DEG (Insert) ×2 IMPLANT
KIT BASIN OR (CUSTOM PROCEDURE TRAY) ×2 IMPLANT
KIT STABILIZATION SHOULDER (MISCELLANEOUS) ×2 IMPLANT
KIT TURNOVER KIT B (KITS) ×2 IMPLANT
MANIFOLD NEPTUNE II (INSTRUMENTS) ×2 IMPLANT
NEEDLE HYPO 25GX1X1/2 BEV (NEEDLE) IMPLANT
NEEDLE MAYO TROCAR (NEEDLE) ×2 IMPLANT
NS IRRIG 1000ML POUR BTL (IV SOLUTION) ×2 IMPLANT
PACK SHOULDER (CUSTOM PROCEDURE TRAY) ×2 IMPLANT
PAD ARMBOARD 7.5X6 YLW CONV (MISCELLANEOUS) ×4 IMPLANT
RESTRAINT HEAD UNIVERSAL NS (MISCELLANEOUS) ×2 IMPLANT
SCREW 5.5X22 (Screw) ×2 IMPLANT
SCREW BONE THREAD 6.5X35 (Screw) ×2 IMPLANT
SCREW PERIPHERAL 30 (Screw) ×2 IMPLANT
SET HNDPC FAN SPRY TIP SCT (DISPOSABLE) ×1 IMPLANT
SLING ARM IMMOBILIZER LRG (SOFTGOODS) ×2 IMPLANT
SLING ULTRA III MED (ORTHOPEDIC SUPPLIES) ×2 IMPLANT
SPONGE LAP 18X18 RF (DISPOSABLE) ×2 IMPLANT
STEM LONG PTC HUMERALTI SZ 2B (Stem) ×2 IMPLANT
SUCTION FRAZIER HANDLE 10FR (MISCELLANEOUS) ×1
SUCTION TUBE FRAZIER 10FR DISP (MISCELLANEOUS) ×1 IMPLANT
SUT ETHIBOND 2 V 37 (SUTURE) ×2 IMPLANT
SUT ETHIBOND NAB CT1 #1 30IN (SUTURE) ×2 IMPLANT
SUT FIBERWIRE #5 38 CONV NDL (SUTURE) ×8
SUT MNCRL AB 3-0 PS2 18 (SUTURE) IMPLANT
SUT VIC AB 2-0 CT1 27 (SUTURE) ×1
SUT VIC AB 2-0 CT1 TAPERPNT 27 (SUTURE) ×1 IMPLANT
SUT VIC AB 3-0 SH 27 (SUTURE) ×1
SUT VIC AB 3-0 SH 27X BRD (SUTURE) ×1 IMPLANT
SUTURE FIBERWR #5 38 CONV NDL (SUTURE) ×4 IMPLANT
TOWEL GREEN STERILE (TOWEL DISPOSABLE) ×2 IMPLANT
WATER STERILE IRR 1000ML POUR (IV SOLUTION) ×2 IMPLANT

## 2020-07-29 NOTE — Anesthesia Preprocedure Evaluation (Signed)
Anesthesia Evaluation  Patient identified by MRN, date of birth, ID band Patient awake    Reviewed: Allergy & Precautions, NPO status , Patient's Chart, lab work & pertinent test results  Airway Mallampati: II  TM Distance: >3 FB Neck ROM: Full    Dental  (+) Edentulous Upper, Edentulous Lower   Pulmonary Current Smoker and Patient abstained from smoking.,    Pulmonary exam normal breath sounds clear to auscultation       Cardiovascular hypertension, Pt. on medications and Pt. on home beta blockers + CAD  Normal cardiovascular exam Rhythm:Regular Rate:Normal  ECG: NSR, rate 67   Neuro/Psych negative neurological ROS     GI/Hepatic negative GI ROS, Neg liver ROS,   Endo/Other  negative endocrine ROS  Renal/GU negative Renal ROS     Musculoskeletal  (+) Arthritis ,   Abdominal   Peds  Hematology  (+) anemia , HLD   Anesthesia Other Findings left proximal humerus fracture  Reproductive/Obstetrics                             Anesthesia Physical Anesthesia Plan  ASA: II  Anesthesia Plan: General and Regional   Post-op Pain Management: GA combined w/ Regional for post-op pain   Induction: Intravenous  PONV Risk Score and Plan: 2 and Ondansetron, Dexamethasone and Treatment may vary due to age or medical condition  Airway Management Planned: Oral ETT  Additional Equipment:   Intra-op Plan:   Post-operative Plan: Extubation in OR  Informed Consent: I have reviewed the patients History and Physical, chart, labs and discussed the procedure including the risks, benefits and alternatives for the proposed anesthesia with the patient or authorized representative who has indicated his/her understanding and acceptance.     Dental advisory given  Plan Discussed with: CRNA  Anesthesia Plan Comments:         Anesthesia Quick Evaluation

## 2020-07-29 NOTE — Anesthesia Procedure Notes (Signed)
Procedure Name: Intubation Date/Time: 07/29/2020 7:52 AM Performed by: Clearnce Sorrel, CRNA Pre-anesthesia Checklist: Patient identified, Emergency Drugs available, Suction available, Patient being monitored and Timeout performed Patient Re-evaluated:Patient Re-evaluated prior to induction Oxygen Delivery Method: Circle system utilized Preoxygenation: Pre-oxygenation with 100% oxygen Induction Type: IV induction Ventilation: Mask ventilation without difficulty Laryngoscope Size: Mac and 3 Grade View: Grade I Tube type: Oral Tube size: 6.5 mm Number of attempts: 1 Placement Confirmation: ETT inserted through vocal cords under direct vision,  positive ETCO2 and breath sounds checked- equal and bilateral Secured at: 22 cm Tube secured with: Tape Dental Injury: Teeth and Oropharynx as per pre-operative assessment

## 2020-07-29 NOTE — Anesthesia Postprocedure Evaluation (Signed)
Anesthesia Post Note  Patient: Kathleen Blankenship  Procedure(s) Performed: REVERSE SHOULDER ARTHROPLASTY (Left Shoulder)     Patient location during evaluation: PACU Anesthesia Type: Regional and General Level of consciousness: awake Pain management: pain level controlled Vital Signs Assessment: post-procedure vital signs reviewed and stable Respiratory status: spontaneous breathing, nonlabored ventilation, respiratory function stable and patient connected to nasal cannula oxygen Cardiovascular status: blood pressure returned to baseline and stable Postop Assessment: no apparent nausea or vomiting Anesthetic complications: no   No complications documented.  Last Vitals:  Vitals:   07/29/20 1043 07/29/20 1624  BP: (!) 153/87 128/90  Pulse: 95 88  Resp: 16 18  Temp: 36.7 C 36.7 C  SpO2: 97% 95%    Last Pain:  Vitals:   07/29/20 1624  TempSrc: Oral  PainSc:                  Audianna Landgren P Isiac Breighner

## 2020-07-29 NOTE — Transfer of Care (Signed)
Immediate Anesthesia Transfer of Care Note  Patient: Kathleen Blankenship  Procedure(s) Performed: REVERSE SHOULDER ARTHROPLASTY (Left Shoulder)  Patient Location: PACU  Anesthesia Type:General and Regional  Level of Consciousness: awake, alert  and oriented  Airway & Oxygen Therapy: Patient Spontanous Breathing and Patient connected to nasal cannula oxygen  Post-op Assessment: Report given to RN and Post -op Vital signs reviewed and stable  Post vital signs: Reviewed and stable  Last Vitals:  Vitals Value Taken Time  BP 157/72 07/29/20 0946  Temp    Pulse 103 07/29/20 0948  Resp 12 07/29/20 0948  SpO2 89 % 07/29/20 0948  Vitals shown include unvalidated device data.  Last Pain:  Vitals:   07/29/20 0300  TempSrc: Oral  PainSc:          Complications: No complications documented.

## 2020-07-29 NOTE — Plan of Care (Signed)

## 2020-07-29 NOTE — Progress Notes (Signed)
PROGRESS NOTE    Kathleen Blankenship  GQB:[id number] DOB: Dec 25, 1950 DOA: 07/26/2020 PCP: Tracie Harrier, MD    Brief Narrative:  Mrs. Gutierrez admitted to the hospital with the working diagnosis of closed right elbow fracture, andleft proximal humerus fracture.  69 year old female with past medical history of COPD, rheumatoid arthritis, hypertension, hypothyroidism and depression who sustained a mechanical fall, she tripped and fell, landing on her elbows and shoulders. No head trauma loss of consciousness. On her initial physical examination blood pressure 140/90, heart rate 90, respiratory rate 18, oxygen saturation 94%, her lungs are clear to auscultation bilaterally, heart S1-S2, present rhythmic, soft abdomen, no lower extremity edema.  Radiograph of right elbow with acute, angulated fractures involving the proximal radius and ulna, without definite intra-articular extension. Radiograph left shoulder with acute comminuted displaced fracture involving both the anatomic and surgical necks of the proximal humerus with extension to the glenohumeral joint.  She was initially evaluated at [facility], transferred to [facility] for further advanced orthopedic intervention. She underwent open reduction internal fixation right elbow with radial head replacement on 12/01.  Today patient underwent left reverse total shoulder arthroplasty.    Assessment & Plan:   Principal Problem:   Closed fracture dislocation of right elbow joint, initial encounter Active Problems:   CAD (coronary artery disease)   Rheumatoid arthritis (HCC)   Closed fracture dislocation of right elbow   Closed fracture of left proximal humerus     1. Right elbow fracture (proximal radius and ulna)/ left shoulder fracture (proximal humerus).spopen reduction and internal fixation of right elbow 12/01, Left reverse total shoulder arthroplasty 12/03.   Currently her pain is  well controlled, will continue with acetaminophen, celecoxib, methocarbamol andfentanyl for pain control.   Continue with DVT prophylaxis, PT and OT.   2. HTN/ dyslipidemia.blood pressure today 153/83 mmHg, continue with metoprolol and losartan.   Continue with atorvastatin.   3. RA/ gout.Ontramadol, cholchine and gabapentin.   At home on infliximab, upadacitinib, hydroxychloroquine currently on hold per rheumatology recommendations.   4. COPD On PRN albuterol, no current exacerbation.   5. Hypothyroid.normal TSH.   6. Depression. Continue with sertraline.   Status is: Inpatient  Remains inpatient appropriate because:Inpatient level of care appropriate due to severity of illness   Dispo: The patient is from: Home              Anticipated d/c is to: Home              Anticipated d/c date is: 3 days              Patient currently is not medically stable to d/c.   DVT prophylaxis: Enoxaparin   Code Status:   full  Family Communication:  I spoke with patient's sister at the bedside, we talked in detail about patient's condition, plan of care and prognosis and all questions were addressed.     Consultants:  orthopedics  Procedures:  Left elbow ORIF  Left shoulder arthroplasty   Subjective: Patient post op on her left shoulder, sling ans support in place, no significant pain, no nausea or vomiting, no dyspnea or chest pain.   Objective: Vitals:   07/29/20 0945 07/29/20 1000 07/29/20 1015 07/29/20 1043  BP: (!) 157/72 (!) 164/91 (!) 158/83 (!) 153/87  Pulse: (!) 103 (!) 101 97 95  Resp: 20 17 17 16   Temp: (!) 97.5 F (36.4 C)  (!) 97.5 F (36.4 C) 98 F (36.7 C)  TempSrc:  Oral  SpO2: 93% 95% 94% 97%  Weight:      Height:        Intake/Output Summary (Last 24 hours) at 07/29/2020 1127 Last data filed at 07/29/2020 3295 Gross per 24 hour  Intake 1880 ml  Output 25 ml  Net 1855 ml   Filed Weights   07/26/20 0400  Weight: 57.6 kg     Examination:   General: Not in pain or dyspnea, deconditioned  Neurology: Awake and alert, non focal  E ENT: mild pallor, no icterus, oral mucosa moist Cardiovascular: No JVD. S1-S2 present, rhythmic, no gallops, rubs, or murmurs. No lower extremity edema. Pulmonary: positive breath sounds bilaterally, adequate air movement, no wheezing, rhonchi or rales. Gastrointestinal. Abdomen soft and non tender Skin. Bilateral upper extremities ecchymosis.  Musculoskeletal: no joint deformities     Data Reviewed: I have personally reviewed following labs and imaging studies  CBC: Recent Labs  Lab 07/25/20 1417 07/26/20 0556 07/27/20 0233 07/29/20 0258  WBC 12.9* 9.0 8.3 6.5  NEUTROABS 10.8* 7.1  --  4.2  HGB 13.7 11.9* 10.3* 9.6*  HCT 40.6 36.1 31.2* 27.7*  MCV 91.6 91.6 92.0 89.6  PLT 245 228 190 188   Basic Metabolic Panel: Recent Labs  Lab 07/25/20 1417 07/26/20 0556 07/27/20 0233 07/29/20 0258  NA 137 136 137 139  K 5.0 4.0 3.7 3.6  CL 105 104 102 103  CO2 21* 23 22 25   GLUCOSE 101* 116* 108* 107*  BUN 9 10 7* 9  CREATININE 0.63 0.65 0.64 0.89  CALCIUM 8.9 8.5* 8.5* 8.5*   GFR: Estimated Creatinine Clearance: 50 mL/min (by C-G formula based on SCr of 0.89 mg/dL). Liver Function Tests: Recent Labs  Lab 07/26/20 0556  AST 22  ALT 12  ALKPHOS 41  BILITOT 1.0  PROT 5.4*  ALBUMIN 3.2*   No results for input(s): LIPASE, AMYLASE in the last 168 hours. No results for input(s): AMMONIA in the last 168 hours. Coagulation Profile: No results for input(s): INR, PROTIME in the last 168 hours. Cardiac Enzymes: No results for input(s): CKTOTAL, CKMB, CKMBINDEX, TROPONINI in the last 168 hours. BNP (last 3 results) No results for input(s): PROBNP in the last 8760 hours. HbA1C: No results for input(s): HGBA1C in the last 72 hours. CBG: No results for input(s): GLUCAP in the last 168 hours. Lipid Profile: No results for input(s): CHOL, HDL, LDLCALC, TRIG, CHOLHDL,  LDLDIRECT in the last 72 hours. Thyroid Function Tests: Recent Labs    07/29/20 0258  TSH 0.810   Anemia Panel: No results for input(s): VITAMINB12, FOLATE, FERRITIN, TIBC, IRON, RETICCTPCT in the last 72 hours.    Radiology Studies: I have reviewed all of the imaging during this hospital visit personally     Scheduled Meds: . acetaminophen  650 mg Oral Q6H  . atorvastatin  10 mg Oral Daily  . celecoxib  100 mg Oral BID  . cholecalciferol  2,000 Units Oral Daily  . docusate sodium  100 mg Oral BID  . gabapentin  100 mg Oral TID  . metoprolol succinate  50 mg Oral Daily  . pantoprazole  40 mg Oral Daily  . sertraline  50 mg Oral QHS  . vitamin B-12  1,000 mcg Oral Daily   Continuous Infusions: .  ceFAZolin (ANCEF) IV    . methocarbamol (ROBAXIN) IV       LOS: 3 days        Ozzy Bohlken Gerome Apley, MD

## 2020-07-29 NOTE — Anesthesia Procedure Notes (Signed)
Anesthesia Regional Block: Interscalene brachial plexus block   Pre-Anesthetic Checklist: ,, timeout performed, Correct Patient, Correct Site, Correct Laterality, Correct Procedure, Correct Position, site marked, Risks and benefits discussed,  Surgical consent,  Pre-op evaluation,  At surgeon's request and post-op pain management  Laterality: Left  Prep: chloraprep       Needles:  Injection technique: Single-shot  Needle Type: Echogenic Stimulator Needle     Needle Length: 5cm  Needle Gauge: 21     Additional Needles:   Procedures:,,,, ultrasound used (permanent image in chart),,,,  Narrative:  Start time: 07/29/2020 7:20 AM End time: 07/29/2020 7:30 AM Injection made incrementally with aspirations every 5 mL.  Performed by: Personally  Anesthesiologist: Murvin Natal, MD  Additional Notes: Functioning IV was confirmed and monitors were applied.  A timeout was performed. Sterile prep, hand hygiene and sterile gloves were used. A 72mm 21ga Arrow echogenic stimulator needle was used. Negative aspiration and negative test dose prior to incremental administration of local anesthetic. The patient tolerated the procedure well.  Ultrasound guidance: relevent anatomy identified, needle position confirmed, local anesthetic spread visualized around nerve(s), vascular puncture avoided.  Image printed for medical record.

## 2020-07-29 NOTE — Interval H&P Note (Signed)
All questions answered

## 2020-07-29 NOTE — Plan of Care (Signed)

## 2020-07-29 NOTE — Progress Notes (Signed)
Orthopedic Tech Progress Note Patient Details:  Kathleen Blankenship 1950-11-20 [id number]  Ortho Devices Type of Ortho Device: Shoulder abduction pillow Ortho Device/Splint Location: LUE Ortho Device/Splint Interventions: Ordered   Post Interventions Patient Tolerated: Well Instructions Provided: Poper ambulation with device, Adjustment of device   Terrisa Curfman 07/29/2020, 8:13 AM

## 2020-07-29 NOTE — Op Note (Signed)
Orthopaedic Surgery Operative Note (CSN: [id number])  Niyana Chesbro Lariccia  1951-02-09 Date of Surgery: 07/26/2020 - 07/29/2020   Diagnoses:  Left 4 part proximal humerus fracture with head split  Procedure: Left Reverse Total Shoulder Arthroplasty   Operative Finding Successful completion of planned procedure.  Bone quality was poor but fixation was robust on humerus and glenoid.  We had a great tuberosity fixation as well and dislocation we think would be at a very low risk based on this.  Due to the patient's complex bilateral upper extremity injuries we think that using the arm for activities of daily living minimizing shoulder motion but allowing range of motion of the elbow and wrist keeping the hands in front of the abdomen and chest would be reasonable while sitting and supervised.  Upon patient going to sleep or standing up she would need to have her sling back on and the arm is not yet stable for weightbearing activities past 1 pound.  Post-operative plan: The patient will with restrictions as above.  The patient will be Readmitted to the floor but is okay from my standpoint to discharge tomorrow.  DVT prophylaxis Aspirin 81 mg twice daily for 6 weeks.  Pain control with PRN pain medication preferring oral medicines.  Follow up plan will be scheduled in approximately 7 days for incision check and XR.  Physical therapy to start after first visit.  Implants: Tornier size 2B longstem, 0 high offset tray with a 6 mm poly-, 25 standard baseplate with a 35 center screw and 36 glenosphere.  Post-Op Diagnosis: Same Surgeons:Primary: Hiram Gash, MD Assistants:Caroline McBane PA-C Location: [place] OR ROOM 05 Anesthesia: General with Exparel Interscalene Antibiotics: Ancef 2g preop, Vancomycin 1076m locally Tourniquet time: None Estimated Blood Loss: 1003Complications: None Specimens: None Implants: Implant Name Type Inv. Item Serial No. Manufacturer Lot No. LRB No. Used Action  BASEPLATE  GLENOSPHERE 249ZPSTD - [id number] BASEPLATE GLENOSPHERE 280XKSTD [id number]  Left 1 Implanted  GLENOSPHERE REV SHOULDER 36 - [id number] GLENOSPHERE REV SHOULDER 36 [id number]  Left 1 Implanted  BONE SCREW THREAD 6.5X35MM - [id number] BONE SCREW THREAD 6.5X35MM  [employer]  Left 1 Implanted  SCREW 5.5X22 - [id number] SCREW 5.5X22  [employer]  Left 1 Implanted  SCREW PERIPHERAL 30 - [id number] SCREW PERIPHERAL 30  [employer]  Left 1 Implanted  STEM LONG PTC HUMERALTI SZ 2B - [id number] STEM LONG PTC HUMERALTI SZ 2B [id number]  Left 1 Implanted  INSERT HUMERAL 36X6MM 12.5DEG - [id number] INSERT HUMERAL 36X6MM 12.5DEG [id number]  Left 1 Implanted  IMPLANT REVERSE SHOULDER 0X3.5 - [id number] IMPLANT REVERSE SHOULDER 0X3.5 [id number]  Left 1 Implanted    Indications for Surgery:   SLORETHA UREis a 69y.o. female with fall resulting in bilateral upper extremity fractures with right side managed by Dr. HDoreatha Martinand left side with a 4 part intra-articular proximal humerus fracture necessitating arthroplasty.  We talked about the patient's options and nonoperative measures would likely lead to predictably poor function.  Benefits and risks of operative and nonoperative management were discussed prior to surgery with patient/guardian(s) and informed consent form was completed.  Infection and need for further surgery were discussed as was prosthetic stability and cuff issues.  We additionally specifically discussed risks of axillary nerve injury, infection, periprosthetic fracture, continued pain and longevity of implants prior to beginning procedure.      Procedure:  The patient was identified in the preoperative holding area where the surgical site was marked. Block placed by anesthesia with exparel.  The patient was taken to the OR where a procedural timeout was called and the above  noted anesthesia was induced.  The patient was positioned beachchair on allen table with spider arm positioner.  Preoperative antibiotics were dosed.  The patient's left shoulder was prepped and draped in the usual sterile fashion.  A second preoperative timeout was called.       Standard deltopectoral approach was performed with a #10 blade. We dissected down to the subcutaneous tissues and the cephalic vein was taken laterally with the deltoid. Clavipectoral fascia was incised in line with the incision. Deep retractors were placed. The long of the biceps tendon was identified and there was significant tenosynovitis present.  Tenodesis was performed to the pectoralis tendon with #2 Ethibond. The remaining biceps was followed up into the rotator interval where it was released.   We used the bicipital groove as a landmark for the lesser and greater tuberosity fragments.  We were able to mobilize the lesser tuberosity fragment and placed stay sutures in the bone tendon junction to help with mobilization.  This point we were able to identify the  greater tuberosity fragment and 4 #5  FiberWire sutures were used to place into this for eventual repair of the tuberosities.  Once these were both mobilized we took care to identify the shaft fragment as well as the head fragment.  We carefully identified the head fragment were able to manually remove it.  At this point the axillary nerve was found and palpated and with a tug test noted to be intact.  Protected throughout the remainder of the case with blunt retractors.   We then released the SGHL with bovie cautery prior to placing a curved mayo at the junction of the anterior glenoid well above the axillary nerve and bluntly dissecting the subscapularis from the capsule.  We then carefully protected the axillary nerve as we gently released the inferior capsule to fully mobilize the subscapularis.  An anterior deltoid retractor was then placed as well as a small  Hohmann retractor superiorly.  The glenoid was relatively preserved as we would expect in this fracture patient.  The remaining labrum was removed circumferentially taking great care not to disrupt the posterior capsule.   The glenoid drill guide was placed and used to drill a guide pin in the center, inferior position. The glenoid face was then reamed concentrically over the guide wire. The center hole was drilled over the guidepin in a near anatomic angle of version. Next the glenoid vault was drilled back to a depth of 35 mm.  We tapped and then placed a 22m size baseplate with 0 lateralization was selected with a 35 mm x 6.553mlength central screw.  The base plate was screwed into the glenoid vault obtaining secure fixation. We next placed superior and inferior locking screws for additional fixation.  Next a 36 mm glenosphere was selected and impacted onto the baseplate. The center screw was tightened.  We then repositioned the arm to give access to the humeral shaft fragment.  Drill holes were placed and fiberwire sutures in the shaft for vertical fixation of the tuberosities.  We broached starting with a size one broach and broaching up to 2b which obtained an appropriate fit above the pec and was really well fixed.  We trialed with multiple size tray and polyethylene options and selected a  0 high which provided good stability and range of motion without excess soft tissue tension. The offset was dialed in to match the normal anatomy. The shoulder was trialed.  There was good ROM in all planes and the shoulder was stable with no inferior translation.  We then mobilized her tuberosities again and placed the anterior deep limbs of the 4 #5 fiber wires around the stem.  1 of these was tied down fixing the greater tuberosity in place after bone graft harvest from the humeral head component was placed underneath.  A +0 high offset tray was selected and impacted onto the stem.   A 36+6 polyethylene  liner was impacted onto the stem.  The joint was reduced and thoroughly irrigated with pulsatile lavage. The remaining sutures were then placed through the subscapularis and the bone tendon junction and the tuberosities were reduced after bone graft placed beneath as autograft at the subscap.  We horizontally secured the tuberosities before placing vertical fixation with the suture that was placed into the shaft.  Tuberosities moved as a unit were happy with her overall reduction.  This was checked on fluoroscopy confirming our position.  We irrigated copiously at this point.  Hemostasis was obtained. The deltopectoral interval was reapproximated with #1 Ethibond. The subcutaneous tissues were closed with 3-0 Vicryl and the skin was closed with running monocryl.    The wounds were cleaned and dried and an Aquacel dressing was placed. The drapes taken down. The arm was placed into sling with abduction pillow. Patient was awakened, extubated, and transferred to the recovery room in stable condition. There were no intraoperative complications. The sponge, needle, and attention counts were correct at the end of the case.    [name], PA-C, present and scrubbed throughout the case, critical for completion in a timely fashion, and for retraction, instrumentation, closure.

## 2020-07-30 DIAGNOSIS — S42202A Unspecified fracture of upper end of left humerus, initial encounter for closed fracture: Secondary | ICD-10-CM | POA: Diagnosis not present

## 2020-07-30 DIAGNOSIS — M069 Rheumatoid arthritis, unspecified: Secondary | ICD-10-CM | POA: Diagnosis not present

## 2020-07-30 DIAGNOSIS — S42401A Unspecified fracture of lower end of right humerus, initial encounter for closed fracture: Secondary | ICD-10-CM | POA: Diagnosis not present

## 2020-07-30 DIAGNOSIS — I2510 Atherosclerotic heart disease of native coronary artery without angina pectoris: Secondary | ICD-10-CM | POA: Diagnosis not present

## 2020-07-30 LAB — CBC
HCT: 25.9 % — ABNORMAL LOW (ref 36.0–46.0)
Hemoglobin: 8.9 g/dL — ABNORMAL LOW (ref 12.0–15.0)
MCH: 30.8 pg (ref 26.0–34.0)
MCHC: 34.4 g/dL (ref 30.0–36.0)
MCV: 89.6 fL (ref 80.0–100.0)
Platelets: 211 10*3/uL (ref 150–400)
RBC: 2.89 MIL/uL — ABNORMAL LOW (ref 3.87–5.11)
RDW: 15.3 % (ref 11.5–15.5)
WBC: 8.3 10*3/uL (ref 4.0–10.5)
nRBC: 0 % (ref 0.0–0.2)

## 2020-07-30 MED ORDER — GABAPENTIN 100 MG PO CAPS: 100.0000 mg | ORAL_CAPSULE | Freq: Two times a day (BID) | ORAL | 0 refills | Status: DC

## 2020-07-30 MED ORDER — ASPIRIN 81 MG PO CHEW
81.0000 mg | CHEWABLE_TABLET | Freq: Two times a day (BID) | ORAL | Status: DC
  Administered 2020-07-30 – 2020-07-31 (×3): 81 mg via ORAL
  Filled 2020-07-30 (×3): qty 1

## 2020-07-30 MED ORDER — ACETAMINOPHEN 500 MG PO TABS: 1000.0000 mg | ORAL_TABLET | Freq: Three times a day (TID) | ORAL | 0 refills | Status: AC

## 2020-07-30 MED ORDER — METHOCARBAMOL 500 MG PO TABS: 500.0000 mg | ORAL_TABLET | Freq: Three times a day (TID) | ORAL | 0 refills | Status: DC | PRN

## 2020-07-30 MED ORDER — TRAMADOL HCL 50 MG PO TABS
50.0000 mg | ORAL_TABLET | Freq: Four times a day (QID) | ORAL | 0 refills | Status: AC | PRN
Start: 2020-07-30 — End: 2020-08-06

## 2020-07-30 MED ORDER — OMEPRAZOLE 20 MG PO CPDR: 20.0000 mg | DELAYED_RELEASE_CAPSULE | Freq: Every day | ORAL | 0 refills | Status: DC

## 2020-07-30 MED ORDER — ASPIRIN 81 MG PO CHEW: 81.0000 mg | CHEWABLE_TABLET | Freq: Two times a day (BID) | ORAL | 0 refills | Status: AC

## 2020-07-30 MED ORDER — ONDANSETRON HCL 4 MG PO TABS: 4.0000 mg | ORAL_TABLET | Freq: Three times a day (TID) | ORAL | 1 refills | Status: AC | PRN

## 2020-07-30 NOTE — Plan of Care (Signed)

## 2020-07-30 NOTE — Plan of Care (Signed)

## 2020-07-30 NOTE — Progress Notes (Signed)
   ORTHOPAEDIC PROGRESS NOTE  s/p Procedure(s): REVERSE SHOULDER ARTHROPLASTY on 07/29/2020 with Dr. Griffin Basil ORIF elbow with radial head replacement on 07/26/2020 with Dr. Doreatha Martin  SUBJECTIVE: Reports increased pain in left shoulder now that nerve block has worn off. No pain in right elbow. No chest pain, SOB, nausea/vomiting. Asking when she can go home  OBJECTIVE: PE: General: sitting up in hospital bed. NAD Left shoulder: Dressing CDI and sling well fitting,  full and painless ROM throughout hand with DPC of 0. + Motor in  AIN, PIN, Ulnar distributions. Axillary nerve sensation preserved and symmetric.  Sensation intact in medial, radial, and ulnar distributions. Well perfused digits.  Right elbow: incision CDI. Bruising and mild swelling of the elbow. 20-100 degrees ROM elbow. Distal motor and sensory function intact.    Vitals:   07/30/20 0348 07/30/20 0900  BP: (!) 142/87 (!) 179/96  Pulse: 77 74  Resp: 16 18  Temp: 97.7 F (36.5 C) 98.5 F (36.9 C)  SpO2: 97% 98%    ASSESSMENT: Kathleen Blankenship is a 69 y.o. female doing well postoperatively.  1. POD#1 left reverse total shoulder  2. POD#4 ORIF right elbow with radial head replacement  PLAN: Weightbearing:  - RUE: NWB. ROM as tolerated  - LUE: < 1 pound. Okay for ROM of elbow and wrist keeping hands in front of the abdomen and chest. No ROM of left shoulder. Insicional and dressing care:   - RUE: okay to leave open to air  - LUE: reinforce as needed Orthopedic device(s): Sling Showering: Post-op day #2 with assistance VTE prophylaxis: Aspirin 81 BID x 6 weeks. SCDs. Ambulation Pain control:  1. Tylenol 650 mg q 6 hours scheduled 2. Robaxin 500 mg q 6 hours PRN 3. Tramadol 50-100 mg q 6 hours PRN 4. Neurontin 100 mg TID 5. Fentanyl 25 mcg q 2 hours PRN 6. Celebrex 100 mg BID Follow - up plan: 1 week in office with Dr. Griffin Basil 2 weeks in office with Dr. Doreatha Martin  Dispo: TBD. PT/OT evals pending. Have recommended home  health. Once cleared by medicine team and therapies, okay to discharge from orthopedic standpoint.   Contact information:After hours and holidays please check [email or web address] for group call information for [facility]  [name], PA-C 07/30/2020

## 2020-07-30 NOTE — Progress Notes (Signed)
PROGRESS NOTE    YOANNA JURCZYK  IHK:[id number] DOB: 1951-08-21 DOA: 07/26/2020 PCP: Tracie Harrier, MD    Brief Narrative:  Mrs. Hable admitted to the hospital with the working diagnosis of closed right elbow fracture, andleft proximal humerus fracture.  69 year old female with past medical history of COPD, rheumatoid arthritis, hypertension, hypothyroidism and depression who sustained a mechanical fall, she tripped and fell, landing on her elbows and shoulders. No head trauma loss of consciousness. On her initial physical examination blood pressure 140/90, heart rate 90, respiratory rate 18, oxygen saturation 94%, her lungs are clear to auscultation bilaterally, heart S1-S2, present rhythmic, soft abdomen, no lower extremity edema.  Radiograph of right elbow with acute, angulated fractures involving the proximal radius and ulna, without definite intra-articular extension. Radiograph left shoulder with acute comminuted displaced fracture involving both the anatomic and surgical necks of the proximal humerus with extension to the glenohumeral joint.  She was initially evaluated at [facility], transferred to [facility] for further advanced orthopedic intervention. She underwent open reduction internal fixation right elbow with radial head replacement on 12/01.  12/04 patient underwent left reverse total shoulder arthroplasty.   Pending final recommendations from PT and OT.    Assessment & Plan:   Principal Problem:   Closed fracture dislocation of right elbow joint, initial encounter Active Problems:   CAD (coronary artery disease)   Rheumatoid arthritis (HCC)   Closed fracture dislocation of right elbow   Closed fracture of left proximal humerus    1. Right elbow fracture (proximal radius and ulna)/ left shoulder fracture (proximal humerus).spopen reduction and internal fixation of right elbow 12/01, Left reverse total shoulder  arthroplasty 12/03.   Pain control with acetaminophen, celecoxib, methocarbamol andfentanyl with good toleration.  On DVT prophylaxis. Plan to follow with final recommendations from PT and OT, possible dc home in the next 24 hr.  2. HTN/ dyslipidemia.Blood pressure control with metoprolol and losartan.   On atorvastatin.  3. RA/ gout.No acute flare, continue withtramadol, cholchineand gabapentin.   At home on infliximab,upadacitinib, hydroxychloroquinecurrently on hold per rheumatology recommendations.  4. COPD. As needed albuterol, no clinical signs of current exacerbation.  5. Hypothyroid.normal TSH.   6. Depression. On sertraline. No confusion or agitation     Status is: Inpatient  Remains inpatient appropriate because:Inpatient level of care appropriate due to severity of illness   Dispo: The patient is from: Home              Anticipated d/c is to: Home              Anticipated d/c date is: 1 day              Patient currently is not medically stable to d/c.  DVT prophylaxis: enoxaparin   Code Status:   full  Family Communication:  I spoke with patient's sister at the bedside, we talked in detail about patient's condition, plan of care and prognosis and all questions were addressed.    Consultants:  orthopedics  Procedures:  Left elbow ORIF  Left shoulder arthroplasty     Subjective: Patient is out of bed to chair, left upper extremity in immobilizer, no nausea or vomiting, no dyspnea or chest pain,. Needs assistance for transferring.   Objective: Vitals:   07/29/20 2004 07/29/20 2358 07/30/20 0348 07/30/20 0900  BP: (!) 161/71 140/68 (!) 142/87 (!) 179/96  Pulse: 78 72 77 74  Resp: 17 16 16 18   Temp: 98.5 F (36.9 C) 98.1  F (36.7 C) 97.7 F (36.5 C) 98.5 F (36.9 C)  TempSrc: Oral Oral Oral Oral  SpO2: 96% 100% 97% 98%  Weight:      Height:        Intake/Output Summary (Last 24 hours) at 07/30/2020 1201 Last data  filed at 07/30/2020 1040 Gross per 24 hour  Intake 480 ml  Output 1100 ml  Net -620 ml   Filed Weights   07/26/20 0400  Weight: 57.6 kg    Examination:   General: Not in pain or dyspnea.  Neurology: Awake and alert, non focal  E ENT: no pallor, no icterus, oral mucosa moist Cardiovascular: No JVD. S1-S2 present, rhythmic, no gallops, rubs, or murmurs. No lower extremity edema. Pulmonary: vesicular breath sounds bilaterally, adequate air movement, no wheezing, rhonchi or rales. Gastrointestinal. Abdomen soft and non tender Skin. No rashes Musculoskeletal: left shoulder on immobilizer      Data Reviewed: I have personally reviewed following labs and imaging studies  CBC: Recent Labs  Lab 07/25/20 1417 07/26/20 0556 07/27/20 0233 07/29/20 0258 07/30/20 0232  WBC 12.9* 9.0 8.3 6.5 8.3  NEUTROABS 10.8* 7.1  --  4.2  --   HGB 13.7 11.9* 10.3* 9.6* 8.9*  HCT 40.6 36.1 31.2* 27.7* 25.9*  MCV 91.6 91.6 92.0 89.6 89.6  PLT 245 228 190 204 503   Basic Metabolic Panel: Recent Labs  Lab 07/25/20 1417 07/26/20 0556 07/27/20 0233 07/29/20 0258  NA 137 136 137 139  K 5.0 4.0 3.7 3.6  CL 105 104 102 103  CO2 21* 23 22 25   GLUCOSE 101* 116* 108* 107*  BUN 9 10 7* 9  CREATININE 0.63 0.65 0.64 0.89  CALCIUM 8.9 8.5* 8.5* 8.5*   GFR: Estimated Creatinine Clearance: 50 mL/min (by C-G formula based on SCr of 0.89 mg/dL). Liver Function Tests: Recent Labs  Lab 07/26/20 0556  AST 22  ALT 12  ALKPHOS 41  BILITOT 1.0  PROT 5.4*  ALBUMIN 3.2*   No results for input(s): LIPASE, AMYLASE in the last 168 hours. No results for input(s): AMMONIA in the last 168 hours. Coagulation Profile: No results for input(s): INR, PROTIME in the last 168 hours. Cardiac Enzymes: No results for input(s): CKTOTAL, CKMB, CKMBINDEX, TROPONINI in the last 168 hours. BNP (last 3 results) No results for input(s): PROBNP in the last 8760 hours. HbA1C: No results for input(s): HGBA1C in the  last 72 hours. CBG: No results for input(s): GLUCAP in the last 168 hours. Lipid Profile: No results for input(s): CHOL, HDL, LDLCALC, TRIG, CHOLHDL, LDLDIRECT in the last 72 hours. Thyroid Function Tests: Recent Labs    07/29/20 0258  TSH 0.810   Anemia Panel: No results for input(s): VITAMINB12, FOLATE, FERRITIN, TIBC, IRON, RETICCTPCT in the last 72 hours.    Radiology Studies: I have reviewed all of the imaging during this hospital visit personally     Scheduled Meds: . acetaminophen  650 mg Oral Q6H  . aspirin  81 mg Oral BID  . atorvastatin  10 mg Oral Daily  . celecoxib  100 mg Oral BID  . cholecalciferol  2,000 Units Oral Daily  . docusate sodium  100 mg Oral BID  . gabapentin  100 mg Oral TID  . losartan  50 mg Oral Daily  . metoprolol succinate  50 mg Oral Daily  . pantoprazole  40 mg Oral Daily  . sertraline  50 mg Oral QHS  . vitamin B-12  1,000 mcg Oral Daily   Continuous  Infusions: . methocarbamol (ROBAXIN) IV       LOS: 4 days        Khamarion Bjelland Gerome Apley, MD

## 2020-07-31 ENCOUNTER — Encounter (HOSPITAL_COMMUNITY): Payer: Self-pay | Admitting: Student

## 2020-07-31 DIAGNOSIS — S42401A Unspecified fracture of lower end of right humerus, initial encounter for closed fracture: Secondary | ICD-10-CM | POA: Diagnosis not present

## 2020-07-31 DIAGNOSIS — I2510 Atherosclerotic heart disease of native coronary artery without angina pectoris: Secondary | ICD-10-CM | POA: Diagnosis not present

## 2020-07-31 DIAGNOSIS — S42202A Unspecified fracture of upper end of left humerus, initial encounter for closed fracture: Secondary | ICD-10-CM | POA: Diagnosis not present

## 2020-07-31 DIAGNOSIS — M069 Rheumatoid arthritis, unspecified: Secondary | ICD-10-CM | POA: Diagnosis not present

## 2020-07-31 NOTE — TOC Initial Note (Signed)
Transition of Care [facility]) - Initial/Assessment Note    Patient Details  Name: Kathleen Blankenship MRN: [id number] Date of Birth: [birth year]  Transition of Care [facility]) CM/SW Contact:    Curlene Labrum, RN Phone Number: 07/31/2020, 1:24 PM  Clinical Narrative:                 Case management met with the patient at the bedside regarding transitions of care to home.  The patient lives at home alone but the family is going to be providing 24-supervision and assistance to the patient.  The patient currently has all needed dme and the sister declines the 3:1 since it will not fit in the patient's bathroom and plans to purchase a raised toilet seat from outside [place].  Medicare choice was offered regarding home health agency and the patient did not have a preference but asked about [facility].  [facility] would not be able to provide services due to staffing issues in [place].  I called and spoke with Tommi Rumps, [place] at [facility] and they have accepted the patient for home health services.  No other TOC needs at this time and the patient should be discharged today with family.  Expected Discharge Plan: [place] Barriers to Discharge: No Barriers Identified   Patient Goals and CMS Choice Patient states their goals for this hospitalization and ongoing recovery are:: Patient plans on discharging home with family and home health services. CMS Medicare.gov Compare Post Acute Care list provided to:: Patient Choice offered to / list presented to : Patient  Expected Discharge Plan and Services Expected Discharge Plan: [place]   Discharge Planning Services: CM Consult Post Acute Care Choice: [place] arrangements for the past 2 months: Mobile Home Expected Discharge Date: 07/31/20               DME Arranged:  (Patient's family declined 3:1)         HH Arranged: PT, OT HH Agency: [place] Date [facility] Agency Contacted:  07/31/20 Time [place]: 1322 Representative spoke with at [place]: Meredeth Ide  Prior Living Arrangements/Services Living arrangements for the past 2 months: Mobile Home Lives with:: Self, Adult Children Patient language and need for interpreter reviewed:: Yes        Need for Family Participation in Patient Care: Yes (Comment) Care giver support system in place?: Yes (comment) Current home services: DME Criminal Activity/Legal Involvement Pertinent to Current Situation/Hospitalization: No - Comment as needed  Activities of Daily Living Home Assistive Devices/Equipment: None ADL Screening (condition at time of admission) Patient's cognitive ability adequate to safely complete daily activities?: Yes Is the patient deaf or have difficulty hearing?: No Does the patient have difficulty seeing, even when wearing glasses/contacts?: No Does the patient have difficulty concentrating, remembering, or making decisions?: No Patient able to express need for assistance with ADLs?: Yes Does the patient have difficulty dressing or bathing?: No Independently performs ADLs?: Yes (appropriate for developmental age) Does the patient have difficulty walking or climbing stairs?: No Weakness of Legs: None Weakness of Arms/Hands: Both  Permission Sought/Granted Permission sought to share information with : Case Manager Permission granted to share information with : Yes, Verbal Permission Granted     Permission granted to share info w AGENCY: [place] granted to share info w Relationship: Kieth Brightly - sister     Emotional Assessment Appearance:: Appears stated age Attitude/Demeanor/Rapport: Gracious Affect (typically  observed): Accepting Orientation: : Oriented to Self, Oriented to Place, Oriented to  Time, Oriented to Situation Alcohol / Substance Use: Not Applicable Psych Involvement: No (comment)  Admission diagnosis:  Closed fracture dislocation of right elbow  [S42.401A] Patient Active Problem List   Diagnosis Date Noted  . Closed fracture dislocation of right elbow joint, initial encounter 07/26/2020  . CAD (coronary artery disease) 07/26/2020  . Rheumatoid arthritis ([place]) 07/26/2020  . Closed fracture dislocation of right elbow 07/26/2020  . Closed fracture of left proximal humerus 07/26/2020  . Chest pain on exertion 05/28/2017  . Chest pain 05/28/2017   PCP:  Tracie Harrier, MD Pharmacy:   [id number] [place], [id number]: [phone]: [phone]    Social Determinants of Health (SDOH) Interventions    Readmission Risk Interventions Readmission Risk Prevention Plan 07/31/2020  Post Dischage Appt Complete  Medication Screening Complete  Transportation Screening Complete  Some recent data might be hidden

## 2020-07-31 NOTE — Discharge Instructions (Signed)
Ophelia Charter MD, MPH [name], PA-C [place] [address], [address] [phone] (tel)   [phone] (fax)   [place] REPLACEMENT    WOUND CARE ? You may leave the operative dressing in place until your follow-up appointment. ? KEEP THE INCISIONS CLEAN AND DRY. ? There may be a small amount of fluid/bleeding leaking at the surgical site. This is normal after surgery.  ? If it fills with liquid or blood please call us immediately to change it for you. ? Use the provided ice machine or Ice packs as often as possible for the first 3-4 days, then as needed for pain relief.  Keep a layer of cloth or a shirt between your skin and the cooling unit to prevent frost bite as it can get very cold.  SHOWERING: - You may shower on Post-Op Day #2.  - The dressing is water resistant but do not scrub it as it may start to peel up.   - You may remove the sling for showering, but keep a water resistant pillow under the arm to keep both the  elbow and shoulder away from the body (mimicking the abduction sling).  - Gently pat the area dry.  - Do not soak the shoulder in water. Do not go swimming in the pool or ocean until your sutures are removed. - KEEP THE INCISIONS CLEAN AND DRY.  EXERCISES ? Wear the sling at all times except when doing your exercises.  ? You may remove the sling for showering, but keep the arm across the chest or in a secondary sling.    ? Accidental/Purposeful External Rotation and shoulder flexion (reaching behind you) is to be avoided at all costs for the first month. ? It is ok to come out of your sling if your are sitting and have assistance for eating.   ? Do not lift anything heavier than 1 pound until we discuss it further in clinic. ? Please perform the exercises (as taught by the physical therapist):   Marland Kitchen Elbow / Hand / Wrist  Range of Motion Exercises . Grip strengthening   REGIONAL ANESTHESIA (NERVE  BLOCKS) . The anesthesia team may have performed a nerve block for you if safe in the setting of your care.  This is a great tool used to minimize pain.  Typically the block may start wearing off overnight but the long acting medicine may last for 3-4 days.  The nerve block wearing off can be a challenging period but please utilize your as needed pain medications to try and manage this period.    POST-OP MEDICATIONS- Multimodal approach to pain control . In general your pain will be controlled with a combination of substances.  Prescriptions unless otherwise discussed are electronically sent to your pharmacy.  This is a carefully made plan we use to minimize narcotic use.     ? Gabapentin - Non-narcotic pain medicine taken on a scheduled basis  ? Acetaminophen - Non-narcotic pain medicine taken on a scheduled basis  ? Tramadol - This is a strong narcotic, to be used only on an "as needed" basis for pain. ? Aspirin 81mg  - This medicine is used to minimize the risk of blood clots after surgery. - Take 1 tablet twice a day for 6 weeks ? Omeprazole - daily medicine to protect your stomach while taking anti-inflammatories.  This may only be used in higher risk patients. ? Zofran -  take as needed for nausea ?  Robaxin - take as needed for muscle spasms   FOLLOW-UP ? If you develop a Fever (>101.5), Redness or Drainage from the surgical incision site, please call our office to arrange for an evaluation. ? Please call the office to schedule a follow-up appointment for a wound check, 7-10 days post-operatively.  IF YOU HAVE ANY QUESTIONS, PLEASE FEEL FREE TO CALL OUR OFFICE.  HELPFUL INFORMATION  . If you had a block, it will wear off between 8-24 hrs postop typically.  This is period when your pain may go from nearly zero to the pain you would have had post-op without the block.  This is an abrupt transition but nothing dangerous is happening.  You may take an extra dose of narcotic when this  happens.  ? Your arm will be in a sling following surgery. You will be in this sling for the next 3-4 weeks.  I will let you know the exact duration at your follow-up visit.  ? You may be more comfortable sleeping in a semi-seated position the first few nights following surgery.  Keep a pillow propped under the elbow and forearm for comfort.  If you have a recliner type of chair it might be beneficial.  If not that is fine too, but it would be helpful to sleep propped up with pillows behind your operated shoulder as well under your elbow and forearm.  This will reduce pulling on the suture lines.  ? When dressing, put your operative arm in the sleeve first.  When getting undressed, take your operative arm out last.  Loose fitting, button-down shirts are recommended.  ? In most states it is against the law to drive while your arm is in a sling. And certainly against the law to drive while taking narcotics.  ? You may return to work/school in the next couple of days when you feel up to it. Desk work and typing in the sling is fine.  ? We suggest you use the pain medication the first night prior to going to bed, in order to ease any pain when the anesthesia wears off. You should avoid taking pain medications on an empty stomach as it will make you nauseous.  ? Do not drink alcoholic beverages or take illicit drugs when taking pain medications.  ? Pain medication may make you constipated.  Below are a few solutions to try in this order: - Decrease the amount of pain medication if you aren't having pain. - Drink lots of decaffeinated fluids. - Drink prune juice and/or each dried prunes  o If the first 3 don't work start with additional solutions - Take Colace - an over-the-counter stool softener - Take Senokot - an over-the-counter laxative - Take Miralax - a stronger over-the-counter laxative   Dental Antibiotics:  In most cases prophylactic antibiotics for Dental procdeures after total  joint surgery are not necessary.  Exceptions are as follows:  1. History of prior total joint infection  2. Severely immunocompromised (Organ Transplant, cancer chemotherapy, Rheumatoid biologic meds such as [place])  3. Poorly controlled diabetes (A1C &gt; 8.0, blood glucose over 200)  If you have one of these conditions, contact your surgeon for an antibiotic prescription, prior to your dental procedure.   ADDITIONAL INFORMATION: - You can check out Dr. Rich Fuchs YouTube page for additional videos about sling usage, ice machine, etc. - Search Dr. Ophelia Charter in the YouTube search bar

## 2020-07-31 NOTE — Progress Notes (Signed)
Occupational Therapy Treatment Patient Details Name: Kathleen Blankenship MRN: [id number] DOB: [birth year] Today's Date: 07/31/2020    History of present illness Kathleen Blankenship is an 69 y.o. 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  PMH positive for COPD, rheumatoid arthritis, hypertension, thyroidectomy, depression, tobacco abuse.   OT comments  Per pt request, pt seen for additional session to educate pt's sister on shoulder precautions and completion of ADLs as she will be assisting pt at discharge. In collaboration with OT, pt's sister able to return demo sling mgmt, UB dressing after initial education. Provided safety tips for UB bathing with pt and sister verbalizing understanding of all education.    Follow Up Recommendations  Home health OT;Supervision/Assistance - 24 hour    Equipment Recommendations  3 in 1 bedside commode    Recommendations for Other Services      Precautions / Restrictions Precautions Precautions: Fall;Shoulder Type of Shoulder Precautions: reverse Shoulder Interventions: Don joy ultra sling;At all times;Off for dressing/bathing/exercises Precaution Booklet Issued: Yes (comment) Precaution Comments: ok for AROM of hand, wrist elbow of L UE, no L shoulder movement. [place] for unrestricted ROM to R UE Required Braces or Orthoses: Sling Restrictions Weight Bearing Restrictions: Yes RUE Weight Bearing: Non weight bearing LUE Weight Bearing: Non weight bearing       Mobility Bed Mobility Overal bed mobility: Modified Independent Bed Mobility: Supine to Sit              Transfers Overall transfer level: Needs assistance Equipment used: None Transfers: Sit to/from Stand Sit to Stand: Supervision         General transfer comment: Supervision for safety, cueing for WB precautions with pt able to demo without  difficulty     Balance Overall balance assessment: Needs assistance Sitting-balance support: No upper extremity supported;Feet supported Sitting balance-Leahy Scale: Fair     Standing balance support: No upper extremity supported Standing balance-Leahy Scale: Fair                             ADL either performed or assessed with clinical judgement   ADL Overall ADL's : Needs assistance/impaired           Upper Body Bathing Details (indicate cue type and reason): Educated sister on what pt is allowed to do for UB bathing and what she may need assistance with     Upper Body Dressing : Moderate assistance;Sitting;With caregiver independent assisting;Cueing for UE precautions Upper Body Dressing Details (indicate cue type and reason): Educated pt/sister on sling mgmt and donning button up shirt sitting EOB with sister independent in demo how to manage                   General ADL Comments: Educated sister on UE precautions, exercise program and assistance for ADLs     Vision   Vision Assessment?: No apparent visual deficits   Perception     Praxis      Cognition Arousal/Alertness: Awake/alert Behavior During Therapy: WFL for tasks assessed/performed Overall Cognitive Status: Within Functional Limits for tasks assessed                                 General Comments: pleasantly cooperative        Exercises  Shoulder Instructions       General Comments      Pertinent Vitals/ Pain       Pain Assessment: Faces Faces Pain Scale: Hurts a little bit Pain Location: L shoulder and  R elbow Pain Descriptors / Indicators: Operative site guarding;Aching Pain Intervention(s): RN gave pain meds during session  Home Living                                          Prior Functioning/Environment              Frequency  Min 2X/week        Progress Toward Goals  OT Goals(current goals can now be found in  the care plan section)  Progress towards OT goals: Progressing toward goals  Acute Rehab OT Goals Patient Stated Goal: go home safely OT Goal Formulation: With patient Time For Goal Achievement: 08/14/20 Potential to Achieve Goals: Good ADL Goals Pt Will Perform Grooming: with set-up;with supervision;sitting Pt Will Perform Upper Body Bathing: with min assist;with caregiver independent in assisting;sitting Pt Will Perform Upper Body Dressing: with min assist;with caregiver independent in assisting;sitting Pt Will Transfer to Toilet: with min guard assist;with supervision;ambulating;stand pivot transfer;regular height toilet;bedside commode;grab bars Additional ADL Goal #1: AROM of R elbow to tolerance during ADL/selfcare tasks  Plan Discharge plan remains appropriate    Co-evaluation                 AM-PAC OT "6 Clicks" Daily Activity     Outcome Measure   Help from another person eating meals?: A Little Help from another person taking care of personal grooming?: A Little Help from another person toileting, which includes using toliet, bedpan, or urinal?: A Little Help from another person bathing (including washing, rinsing, drying)?: A Lot Help from another person to put on and taking off regular upper body clothing?: A Lot Help from another person to put on and taking off regular lower body clothing?: A Little 6 Click Score: 16    End of Session Equipment Utilized During Treatment: Other (comment) (sling)  OT Visit Diagnosis: Unsteadiness on feet (R26.81);Other abnormalities of gait and mobility (R26.89);Muscle weakness (generalized) (M62.81);History of falling (Z91.81);Pain Pain - part of body: Shoulder;Arm   Activity Tolerance Patient tolerated treatment well   Patient Left in bed;with call bell/phone within reach;with family/visitor present   Nurse Communication          Time: 1540-0867 OT Time Calculation (min): 16 min  Charges: OT General Charges $OT  Visit: 1 Visit OT Treatments $Self Care/Home Management : 8-22 mins  Layla Maw, OTR/L   Layla Maw 07/31/2020, 12:58 PM

## 2020-07-31 NOTE — Progress Notes (Signed)
Pt was given her AVS discharge summary and went over with her and her sister. Pt had no other questions. IV was removed with catheter intact. Pt belongings and equipment went home with her.

## 2020-07-31 NOTE — Progress Notes (Signed)
   ORTHOPAEDIC PROGRESS NOTE  s/p Procedure(s): REVERSE SHOULDER ARTHROPLASTY on 07/29/2020 with Dr. Griffin Basil ORIF elbow with radial head replacement on 07/26/2020 with Dr. Doreatha Martin  SUBJECTIVE: Pain improved today. Did not work with OT or PT yesterday. No chest pain, SOB, nausea/vomiting. Asking when she can go home  OBJECTIVE: PE: General: sitting up in hospital bed. NAD Left shoulder: Dressing CDI and sling well fitting,  full and painless ROM throughout hand with DPC of 0. + Motor in  AIN, PIN, Ulnar distributions. Axillary nerve sensation preserved and symmetric.  Sensation intact in medial, radial, and ulnar distributions. Well perfused digits.  Right elbow: incision CDI. Bruising and mild swelling of the elbow. 20-130 degrees ROM elbow. Distal motor and sensory function intact.    Vitals:   07/30/20 2046 07/31/20 0436  BP: (!) 190/83 (!) 189/107  Pulse: 86 85  Resp: 17 16  Temp: 97.7 F (36.5 C) 97.7 F (36.5 C)  SpO2: 99% 96%    ASSESSMENT: Kathleen Blankenship is a 69 y.o. female doing well postoperatively.  1. POD#2 left reverse total shoulder  2. POD#5 ORIF right elbow with radial head replacement  PLAN: Weightbearing:  - RUE: NWB. ROM as tolerated  - LUE: < 1 pound. Okay for ROM of elbow and wrist keeping hands in front of the abdomen and chest. No ROM of left shoulder. Insicional and dressing care:   - RUE: okay to leave open to air  - LUE: reinforce as needed Orthopedic device(s): Sling Showering: Post-op day #2 with assistance VTE prophylaxis: Aspirin 81 BID x 6 weeks. SCDs. Ambulation Pain control:  1. Tylenol 650 mg q 6 hours scheduled 2. Robaxin 500 mg q 6 hours PRN 3. Tramadol 50-100 mg q 6 hours PRN 4. Neurontin 100 mg TID 5. Fentanyl 25 mcg q 2 hours PRN 6. Celebrex 100 mg BID Follow - up plan: 1 week in office with Dr. Griffin Basil 2 weeks in office with Dr. Doreatha Martin  Dispo: TBD. PT/OT evals pending. Have recommended home health. Once cleared by medicine team  and therapies, okay to discharge from orthopedic standpoint. Discharge medication sent to patient's pharmacy.   Contact information: Dr. Ophelia Charter. [name], PA-C. After hours and holidays please check [email or web address] for group call information for [facility]  [name], PA-C 07/31/2020

## 2020-07-31 NOTE — Discharge Summary (Signed)
Physician Discharge Summary  Ariatna Jester Hoadley DZH:[id number] DOB: [birth year] DOA: 07/26/2020  PCP: Tracie Harrier, MD  Admit date: 07/26/2020 Discharge date: 07/31/2020  Admitted From: Home  Disposition:  Home   Recommendations for Outpatient Follow-up and new medication changes:  1. Follow up with Dr. Ginette Pitman in 7 days.  2. Follow up with Dr. Doreatha Martin in 2 weeks.  3. Follow up with Dr. Griffin Basil in 1 week.   Home Health: yes  Equipment/Devices: na    Discharge Condition: stable  CODE STATUS: full  Diet recommendation: heart healthy   Brief/Interim Summary: Mrs. Tenenbaum admitted to the hospital with the working diagnosis of closed right elbow fracture, andleft proximal humerus fracture.  69 year old female with past medical history of COPD, rheumatoid arthritis, hypertension, hypothyroidism and depression who sustained a mechanical fall, she tripped and fell, landing on her elbows and shoulders. No head trauma or loss of consciousness. On her initial physical examination blood pressure 140/90, heart rate 90, respiratory rate 18, oxygen saturation 94%, her lungs were clear to auscultation bilaterally, heart S1-S2, present rhythmic, soft abdomen, no lower extremity edema. Sodium 136, potassium 4.0, chloride 104, bicarb 23, glucose 116, BUN 10, creatinine 0.65.  White count 9.0, hemoglobin 11.9, hematocrit 36.1, platelets 228.  SARS COVID-19 negative. Head and maxillofacial CT no acute changes. EKG 67 bpm, normal axis, normal intervals, sinus rhythm, no significant ST segment or T wave changes.  Radiograph of right elbow with acute, angulated fractures involving the proximal radius and ulna, without definite intra-articular extension. Radiograph left shoulder with acute comminuted displaced fracture involving both the anatomic and surgical necks of the proximal humerus with extension to the glenohumeral joint.  CT left shoulder with comminuted fracture of the left humeral neck and head  with inferior position of the articular surface of the humeral head in relation to the glenoid consistent with partial dislocation or subluxation. CT right elbow with comminuted fracture of the proximal ulnar metaphysis, prominently angulated fracture at the junction of the radial head and neck.  She was initially evaluated at [facility], transferred to [facility] for further advanced orthopedic intervention. She underwent open reduction internal fixation right elbow with radial head replacement on 12/01.  12/04 patient underwent left reverse total shoulder arthroplasty.  1. Right elbow fracture, proximal radius and ulna, left shoulder fracture, proximal humerus.  Patient was admitted to the medical ward, she underwent 2 surgical procedures, right elbow and left shoulder with no major complications. Her pain has been well controlled, she has been mobile with assistance of physical therapy. She received DVT prophylaxis.  Patient will be discharged home, follow-up with orthopedics.  She will need home health services.  2.  Hypertension/dyslipidemia.  She was resumed on antihypertensive regimen with metoprolol and losartan.  Continue atorvastatin.  3.  Rheumatoid arthritis/gout.  No signs of acute exacerbation, patient was continued on tramadol, colchicine and gabapentin. At home on infliximab,upadacitinib, hydroxychloroquinecurrently on hold per rheumatology recommendations.  4.  COPD.  No signs of acute exacerbation, continue as needed albuterol.  5.  History of hypothyroid.  TSH normal.  6.  Depression.  Continue sertraline.   Discharge Diagnoses:  Principal Problem:   Closed fracture dislocation of right elbow joint, initial encounter Active Problems:   CAD (coronary artery disease)   Rheumatoid arthritis (HCC)   Closed fracture dislocation of right elbow   Closed fracture of left proximal humerus    Discharge Instructions   Allergies as  of 07/31/2020      Reactions  Codeine Hives   Remicade [infliximab] Other (See Comments)   Chest felt heavy, was given benadryl.      Medication List    TAKE these medications   acetaminophen 500 MG tablet Commonly known as: TYLENOL Take 2 tablets (1,000 mg total) by mouth every 8 (eight) hours for 14 days.   albuterol 108 (90 Base) MCG/ACT inhaler Commonly known as: VENTOLIN HFA Inhale 2 puffs into the lungs every 6 (six) hours as needed for wheezing or shortness of breath.   aspirin 81 MG chewable tablet Commonly known as: Aspirin Childrens Chew 1 tablet (81 mg total) by mouth 2 (two) times daily. For 6 weeks for DVT prophylaxis after surgery   atorvastatin 10 MG tablet Commonly known as: LIPITOR Take 10 mg by mouth daily. What changed: Another medication with the same name was removed. Continue taking this medication, and follow the directions you see here.   benzonatate 200 MG capsule Commonly known as: TESSALON Take 200 mg by mouth daily as needed for cough.   colchicine 0.6 MG tablet Take 1 tablet (0.6 mg total) by mouth daily.   gabapentin 100 MG capsule Commonly known as: Neurontin Take 1 capsule (100 mg total) by mouth 2 (two) times daily for 14 days. For pain.   hydroxychloroquine 200 MG tablet Commonly known as: PLAQUENIL Take 200 mg by mouth daily.   inFLIXimab 100 MG injection Commonly known as: REMICADE Inject 100 mg into the vein.   leflunomide 20 MG tablet Commonly known as: ARAVA Take 20 mg by mouth daily.   losartan 50 MG tablet Commonly known as: COZAAR Take 50 mg by mouth daily.   methocarbamol 500 MG tablet Commonly known as: Robaxin Take 1 tablet (500 mg total) by mouth every 8 (eight) hours as needed for muscle spasms.   metoprolol succinate 50 MG 24 hr tablet Commonly known as: TOPROL-XL Take 50 mg by mouth daily.   omeprazole 20 MG capsule Commonly known as: PriLOSEC Take 1 capsule (20 mg total) by mouth daily. 30 days for  gastroprotection while taking NSAIDs.   ondansetron 4 MG tablet Commonly known as: Zofran Take 1 tablet (4 mg total) by mouth every 8 (eight) hours as needed for up to 7 days for nausea or vomiting.   Rinvoq 15 MG Tb24 Generic drug: Upadacitinib ER Take 15 mg by mouth daily.   sertraline 50 MG tablet Commonly known as: ZOLOFT Take 50 mg by mouth at bedtime.   traMADol 50 MG tablet Commonly known as: Ultram Take 1 tablet (50 mg total) by mouth every 6 (six) hours as needed for up to 7 days for moderate pain or severe pain. What changed: reasons to take this   vitamin B-12 1000 MCG tablet Commonly known as: CYANOCOBALAMIN Take 1,000 mcg by mouth daily.            Durable Medical Equipment  (From admission, onward)         Start     Ordered   07/28/20 1127  For home use only DME Eelevated commode seat  Once        07/28/20 1126   07/28/20 1127  For home use only DME Shower stool  Once        07/28/20 1126   07/28/20 1127  For home use only DME Tub bench  Once        07/28/20 1126          Follow-up Information    Hiram Gash, MD In 1  week.   Specialty: Orthopedic Surgery Why: to recheck left shoulder Contact information: [address] [place] [address] [phone]        Shona Needles, MD In 2 weeks.   Specialty: Orthopedic Surgery Why: Recheck right elbow Contact information: [place] [address] [phone]        Tracie Harrier, MD Follow up in 1 week(s).   Specialty: Internal Medicine Contact information: [place] [address] [phone]              Allergies  Allergen Reactions  . Codeine Hives  . Remicade [Infliximab] Other (See Comments)    Chest felt heavy, was given benadryl.    Consultations:  Orthopedics    Procedures/Studies: DG Shoulder 1V Left  Result Date: 07/29/2020 CLINICAL DATA:  Portable imaging for left shoulder  arthroplasty. EXAM: DG C-ARM 1-60 MIN; LEFT SHOULDER: ONE VIEW COMPARISON:  None. FINDINGS: Single view shows a left shoulder arthroplasty, reverse configuration, with the prosthetic components well seated and aligned. IMPRESSION: Portable imaging for left shoulder arthroplasty. Electronically Signed   By: Lajean Manes M.D.   On: 07/29/2020 10:32   DG Elbow 2 Views Right  Result Date: 07/26/2020 CLINICAL DATA:  69 year old female with ORIF of the right elbow. EXAM: RIGHT ELBOW - 2 VIEW COMPARISON:  Fluoroscopic intraoperative study dated 07/26/2020. FINDINGS: There is replacement of the radial head. Fixation plate and screws noted in the proximal ulna. The bones are osteopenic. There is no dislocation. Postsurgical changes of the soft tissues. IMPRESSION: Status post replacement of the radial head and plate and screws of the proximal ulna. Electronically Signed   By: Anner Crete M.D.   On: 07/26/2020 18:44   DG Elbow Complete Right  Result Date: 07/26/2020 CLINICAL DATA:  ORIF elbow EXAM: DG C-ARM 1-60 MIN; RIGHT ELBOW - COMPLETE 3+ VIEW FLUOROSCOPY TIME:  Fluoroscopy Time:  61.2 seconds. COMPARISON:  November 30 21. FINDINGS: Seven C-arm fluoroscopic images were obtained intraoperatively and submitted for post operative interpretation. These images demonstrate plate and screw fixation of the proximal ulna and radial head replacement. Please see the performing provider's procedural report for further detail. IMPRESSION: Intraoperative fluoroscopic imaging, as detailed above. Electronically Signed   By: Margaretha Sheffield MD   On: 07/26/2020 11:25   DG Elbow Complete Right  Result Date: 07/25/2020 CLINICAL DATA:  Post fall, now with right elbow pain. EXAM: RIGHT ELBOW - COMPLETE 3+ VIEW COMPARISON:  None FINDINGS: Acute displaced fractures involving the proximal radius and ulna with angulation and foreshortening, apex dorsal. No definitive intra-articular extension. Expected adjacent soft tissue  swelling. No radiopaque foreign body. IMPRESSION: Acute, angulated fractures involving the proximal radius and ulna without definitive intra-articular extension. Electronically Signed   By: Sandi Mariscal M.D.   On: 07/25/2020 14:13   CT Head Wo Contrast  Result Date: 07/25/2020 CLINICAL DATA:  Facial trauma after fall. EXAM: CT HEAD WITHOUT CONTRAST CT MAXILLOFACIAL WITHOUT CONTRAST TECHNIQUE: Multidetector CT imaging of the head and maxillofacial structures were performed using the standard protocol without intravenous contrast. Multiplanar CT image reconstructions of the maxillofacial structures were also generated. COMPARISON:  None. FINDINGS: CT HEAD FINDINGS Brain: No evidence of acute infarction, hemorrhage, hydrocephalus, extra-axial collection or mass lesion/mass effect. Vascular: No hyperdense vessel or unexpected calcification. Skull: Normal. Negative for fracture or focal lesion. Other: None. CT MAXILLOFACIAL FINDINGS Osseous: No fracture or mandibular dislocation. No destructive process. Orbits: Negative. No traumatic or inflammatory  finding. Sinuses: Clear. Soft tissues: Negative. IMPRESSION: 1. Normal head CT. 2. No abnormality seen in maxillofacial region. Electronically Signed   By: Marijo Conception M.D.   On: 07/25/2020 12:53   CT SHOULDER LEFT WO CONTRAST  Result Date: 07/25/2020 CLINICAL DATA:  69 year old female with fall and left humeral neck fracture. EXAM: CT OF THE UPPER LEFT EXTREMITY WITHOUT CONTRAST TECHNIQUE: Multidetector CT imaging of the upper left extremity was performed according to the standard protocol. COMPARISON:  Radiograph dated 07/25/2020. FINDINGS: Bones/Joint/Cartilage There is a comminuted fracture of the left humeral neck and head with displaced fracture fragments. There is inferior positioning of the articular surface of the humeral head in relation to the glenoid consistent with a partial dislocation or subluxation. No other acute fracture. Ligaments Suboptimally  assessed by CT. Muscles and Tendons There is intramuscular and articular edema and hematoma. No drainable fluid collection. Soft tissues Edema. No fluid collection. IMPRESSION: Comminuted fracture of the left humeral neck and head with inferior positioning of the articular surface of the humeral head in relation to the glenoid consistent with a partial dislocation or subluxation. Electronically Signed   By: Anner Crete M.D.   On: 07/25/2020 17:38   CT ELBOW RIGHT WO CONTRAST  Result Date: 07/25/2020 CLINICAL DATA:  Radial and ulnar fractures. EXAM: CT OF THE RIGHT ELBOW WITHOUT CONTRAST TECHNIQUE: Multidetector CT imaging of the RIGHT ELBOW was performed according to the standard protocol. COMPARISON:  Radiographs from 07/25/2020 FINDINGS: Bones/Joint/Cartilage The distal humerus appears intact. Comminuted fracture of the proximal ulnar metaphysis with a dominant transverse component shown on image 48 of series 10. There is also a posterolateral intermediary fragment measuring 2.3 cm as well as a lateral intermediary fragment measuring 2.1 cm. The dominant shaft fragment is laterally displaced about 9 mm with respect to the dominant proximal fragment along with some mild apex posterior angulation. There is an impacted transverse fracture of the radial head/neck with almost 90 degrees of angulation between the remaining articular fragment and the shaft fragment as shown on image 41 of series 10. Elbow joint effusion. Ligaments Suboptimally assessed by CT. Muscles and Tendons Expected edema in musculature surrounding the fracture sites. Soft tissues Subcutaneous edema along the elbow especially posteriorly, suspected blood products or fluid in the olecranon bursa. 3D multiplanar reconstruction was performed for further depiction of the fractures. IMPRESSION: 1. Comminuted fracture of the proximal ulnar metaphysis. Mild angulation and moderate displacement. 2. Prominently angulated fracture at the junction of  the radial head and neck. 3. Elbow joint effusion. 4. Subcutaneous edema along the elbow especially posteriorly, suspected blood products or fluid in the olecranon bursa. Electronically Signed   By: Van Clines M.D.   On: 07/25/2020 17:41   CT 3D RECON AT SCANNER  Result Date: 07/25/2020 : 3D reconstruction of the left shoulder was performed. Please see accession number [id number] [facility] for diagnostic report. Electronically Signed   By: Van Clines M.D.   On: 07/25/2020 17:43   CT 3D RECON AT SCANNER  Result Date: 07/25/2020 CLINICAL DATA:  Radial and ulnar fractures. EXAM: CT OF THE RIGHT ELBOW WITHOUT CONTRAST TECHNIQUE: Multidetector CT imaging of the RIGHT ELBOW was performed according to the standard protocol. COMPARISON:  Radiographs from 07/25/2020 FINDINGS: Bones/Joint/Cartilage The distal humerus appears intact. Comminuted fracture of the proximal ulnar metaphysis with a dominant transverse component shown on image 48 of series 10. There is also a posterolateral intermediary fragment measuring 2.3 cm as well as a lateral intermediary fragment measuring 2.1 cm.  The dominant shaft fragment is laterally displaced about 9 mm with respect to the dominant proximal fragment along with some mild apex posterior angulation. There is an impacted transverse fracture of the radial head/neck with almost 90 degrees of angulation between the remaining articular fragment and the shaft fragment as shown on image 41 of series 10. Elbow joint effusion. Ligaments Suboptimally assessed by CT. Muscles and Tendons Expected edema in musculature surrounding the fracture sites. Soft tissues Subcutaneous edema along the elbow especially posteriorly, suspected blood products or fluid in the olecranon bursa. 3D multiplanar reconstruction was performed for further depiction of the fractures. IMPRESSION: 1. Comminuted fracture of the proximal ulnar metaphysis. Mild angulation and moderate displacement. 2. Prominently  angulated fracture at the junction of the radial head and neck. 3. Elbow joint effusion. 4. Subcutaneous edema along the elbow especially posteriorly, suspected blood products or fluid in the olecranon bursa. Electronically Signed   By: Van Clines M.D.   On: 07/25/2020 17:41   DG CHEST PORT 1 VIEW  Result Date: 07/26/2020 CLINICAL DATA:  Left shoulder fracture EXAM: PORTABLE CHEST 1 VIEW COMPARISON:  05/28/2017 FINDINGS: The lungs are symmetrically mildly hyperinflated in keeping with changes of underlying COPD. Minimal linear scarring at the left lung base. The lungs are otherwise clear. No pneumothorax or pleural effusion. Cardiac size within normal limits. Pulmonary vascularity is normal. Mild thoracic 6 point scoliosis is noted. An acute three-part fracture of the left humeral head is identified with inferior subluxation of the a articular surface of the humeral head from the glenoid fossa likely representing pseudosubluxation in the setting of a left shoulder effusion. IMPRESSION: COPD. Acute three-part fracture of the left humeral head with pseudosubluxation noted. Electronically Signed   By: Fidela Salisbury MD   On: 07/26/2020 06:19   DG Shoulder Left  Result Date: 07/25/2020 CLINICAL DATA:  Post fall, now with shoulder pain. EXAM: LEFT SHOULDER - 2+ VIEW COMPARISON:  None. FINDINGS: There is an acute comminuted fracture involving both the anatomic and surgical necks of the proximal humerus with foreshortening and angulation, apex posterior and extension of the fracture to the glenohumeral joint. No additional fractures identified. Acromioclavicular joint spaces appear preserved. Expected adjacent soft tissue swelling.  No radiopaque foreign body Limited visualization of the adjacent thorax demonstrates atherosclerotic plaque within the aortic arch. IMPRESSION: Acute, comminuted, displaced fracture involving both the anatomic and surgical necks of the proximal humerus with extension to the  glenohumeral joint. Electronically Signed   By: Sandi Mariscal M.D.   On: 07/25/2020 14:15   DG Shoulder Left Port  Result Date: 07/29/2020 CLINICAL DATA:  Status post left shoulder arthroplasty EXAM: LEFT SHOULDER COMPARISON:  Films from earlier in the same day. FINDINGS: Left shoulder arthroplasty is noted. Fragments are noted along the proximal humerus related to the prior fracture. No soft tissue abnormality is noted. IMPRESSION: Status post left shoulder arthroplasty. Electronically Signed   By: Inez Catalina M.D.   On: 07/29/2020 11:32   DG C-Arm 1-60 Min  Result Date: 07/29/2020 CLINICAL DATA:  Portable imaging for left shoulder arthroplasty. EXAM: DG C-ARM 1-60 MIN; LEFT SHOULDER: ONE VIEW COMPARISON:  None. FINDINGS: Single view shows a left shoulder arthroplasty, reverse configuration, with the prosthetic components well seated and aligned. IMPRESSION: Portable imaging for left shoulder arthroplasty. Electronically Signed   By: Lajean Manes M.D.   On: 07/29/2020 10:32   DG C-Arm 1-60 Min  Result Date: 07/26/2020 CLINICAL DATA:  ORIF elbow EXAM: DG C-ARM 1-60 MIN; RIGHT  ELBOW - COMPLETE 3+ VIEW FLUOROSCOPY TIME:  Fluoroscopy Time:  61.2 seconds. COMPARISON:  November 30 21. FINDINGS: Seven C-arm fluoroscopic images were obtained intraoperatively and submitted for post operative interpretation. These images demonstrate plate and screw fixation of the proximal ulna and radial head replacement. Please see the performing provider's procedural report for further detail. IMPRESSION: Intraoperative fluoroscopic imaging, as detailed above. Electronically Signed   By: Margaretha Sheffield MD   On: 07/26/2020 11:25   CT Maxillofacial Wo Contrast  Result Date: 07/25/2020 CLINICAL DATA:  Facial trauma after fall. EXAM: CT HEAD WITHOUT CONTRAST CT MAXILLOFACIAL WITHOUT CONTRAST TECHNIQUE: Multidetector CT imaging of the head and maxillofacial structures were performed using the standard protocol without  intravenous contrast. Multiplanar CT image reconstructions of the maxillofacial structures were also generated. COMPARISON:  None. FINDINGS: CT HEAD FINDINGS Brain: No evidence of acute infarction, hemorrhage, hydrocephalus, extra-axial collection or mass lesion/mass effect. Vascular: No hyperdense vessel or unexpected calcification. Skull: Normal. Negative for fracture or focal lesion. Other: None. CT MAXILLOFACIAL FINDINGS Osseous: No fracture or mandibular dislocation. No destructive process. Orbits: Negative. No traumatic or inflammatory finding. Sinuses: Clear. Soft tissues: Negative. IMPRESSION: 1. Normal head CT. 2. No abnormality seen in maxillofacial region. Electronically Signed   By: Marijo Conception M.D.   On: 07/25/2020 12:53      Procedures:  Left elbow ORIF  Left shoulder arthroplasty   Subjective: Patient is feeling well, no nausea or vomiting, no chest pain or dyspnea, post operative pain is well controlled.   Discharge Exam: Vitals:   07/30/20 2046 07/31/20 0436  BP: (!) 190/83 (!) 189/107  Pulse: 86 85  Resp: 17 16  Temp: 97.7 F (36.5 C) 97.7 F (36.5 C)  SpO2: 99% 96%   Vitals:   07/30/20 1234 07/30/20 1535 07/30/20 2046 07/31/20 0436  BP: (!) 174/84 (!) 172/89 (!) 190/83 (!) 189/107  Pulse: 79 82 86 85  Resp: 17 18 17 16   Temp: 98.3 F (36.8 C) 97.9 F (36.6 C) 97.7 F (36.5 C) 97.7 F (36.5 C)  TempSrc: Oral Oral Oral Oral  SpO2: 99% 97% 99% 96%  Weight:      Height:        General: Not in pain or dyspnea.  Neurology: Awake and alert, non focal  E ENT: no pallor, no icterus, oral mucosa moist Cardiovascular: No JVD. S1-S2 present, rhythmic, no gallops, rubs, or murmurs. No lower extremity edema. Pulmonary: positive breath sounds bilaterally, Gastrointestinal. Abdomen soft and non tender Skin. No rashes Musculoskeletal: bilateral upper extremities orthopedic devices in place.    The results of significant diagnostics from this  hospitalization (including imaging, microbiology, ancillary and laboratory) are listed below for reference.     Microbiology: Recent Results (from the past 240 hour(s))  Resp Panel by RT-PCR (Flu A&B, Covid) Nasopharyngeal Swab     Status: None   Collection Time: 07/25/20  3:54 PM   Specimen: Nasopharyngeal Swab; Nasopharyngeal(NP) swabs in vial transport medium  Result Value Ref Range Status   SARS Coronavirus 2 by RT PCR NEGATIVE NEGATIVE Final    Comment: (NOTE) SARS-CoV-2 target nucleic acids are NOT DETECTED.  The SARS-CoV-2 RNA is generally detectable in upper respiratory specimens during the acute phase of infection. The lowest concentration of SARS-CoV-2 viral copies this assay can detect is 138 copies/mL. A negative result does not preclude SARS-Cov-2 infection and should not be used as the sole basis for treatment or other patient management decisions. A negative result may occur with  improper specimen collection/handling, submission of specimen other than nasopharyngeal swab, presence of viral mutation(s) within the areas targeted by this assay, and inadequate number of viral copies(<138 copies/mL). A negative result must be combined with clinical observations, patient history, and epidemiological information. The expected result is Negative.  Fact Sheet for Patients:  EntrepreneurPulse.com.au  Fact Sheet for Healthcare Providers:  IncredibleEmployment.be  This test is no t yet approved or cleared by the [place] FDA and  has been authorized for detection and/or diagnosis of SARS-CoV-2 by FDA under an Emergency Use Authorization (EUA). This EUA will remain  in effect (meaning this test can be used) for the duration of the COVID-19 declaration under Section 564(b)(1) of the Act, 21 U.S.C.section 360bbb-3(b)(1), unless the authorization is terminated  or revoked sooner.       Influenza A by PCR NEGATIVE NEGATIVE Final    Influenza B by PCR NEGATIVE NEGATIVE Final    Comment: (NOTE) The Xpert Xpress SARS-CoV-2/FLU/RSV plus assay is intended as an aid in the diagnosis of influenza from Nasopharyngeal swab specimens and should not be used as a sole basis for treatment. Nasal washings and aspirates are unacceptable for Xpert Xpress SARS-CoV-2/FLU/RSV testing.  Fact Sheet for Patients: EntrepreneurPulse.com.au  Fact Sheet for Healthcare Providers: IncredibleEmployment.be  This test is not yet approved or cleared by the [place] FDA and has been authorized for detection and/or diagnosis of SARS-CoV-2 by FDA under an Emergency Use Authorization (EUA). This EUA will remain in effect (meaning this test can be used) for the duration of the COVID-19 declaration under Section 564(b)(1) of the Act, 21 U.S.C. section 360bbb-3(b)(1), unless the authorization is terminated or revoked.  Performed at [facility], [address] [place], [place] [address]   Surgical pcr screen     Status: None   Collection Time: 07/26/20  4:32 AM   Specimen: Nasal Mucosa; Nasal Swab  Result Value Ref Range Status   MRSA, PCR NEGATIVE NEGATIVE Final   Staphylococcus aureus NEGATIVE NEGATIVE Final    Comment: (NOTE) The Xpert SA Assay (FDA approved for NASAL specimens in patients 53 years of age and older), is one component of a comprehensive surveillance program. It is not intended to diagnose infection nor to guide or monitor treatment. Performed at [facility], [place] [address] [place], [place] [address]      Labs: BNP (last 3 results) No results for input(s): BNP in the last 8760 hours. Basic Metabolic Panel: Recent Labs  Lab 07/25/20 1417 07/26/20 0556 07/27/20 0233 07/29/20 0258  NA 137 136 137 139  K 5.0 4.0 3.7 3.6  CL 105 104 102 103  CO2 21* 23 22 25   GLUCOSE 101* 116* 108* 107*  BUN 9 10 7* 9  CREATININE 0.63 0.65 0.64 0.89  CALCIUM 8.9 8.5*  8.5* 8.5*   Liver Function Tests: Recent Labs  Lab 07/26/20 0556  AST 22  ALT 12  ALKPHOS 41  BILITOT 1.0  PROT 5.4*  ALBUMIN 3.2*   No results for input(s): LIPASE, AMYLASE in the last 168 hours. No results for input(s): AMMONIA in the last 168 hours. CBC: Recent Labs  Lab 07/25/20 1417 07/26/20 0556 07/27/20 0233 07/29/20 0258 07/30/20 0232  WBC 12.9* 9.0 8.3 6.5 8.3  NEUTROABS 10.8* 7.1  --  4.2  --   HGB 13.7 11.9* 10.3* 9.6* 8.9*  HCT 40.6 36.1 31.2* 27.7* 25.9*  MCV 91.6 91.6 92.0 89.6 89.6  PLT 245 228 190 204 211   Cardiac  Enzymes: No results for input(s): CKTOTAL, CKMB, CKMBINDEX, TROPONINI in the last 168 hours. BNP: Invalid input(s): POCBNP CBG: No results for input(s): GLUCAP in the last 168 hours. D-Dimer No results for input(s): DDIMER in the last 72 hours. Hgb A1c No results for input(s): HGBA1C in the last 72 hours. Lipid Profile No results for input(s): CHOL, HDL, LDLCALC, TRIG, CHOLHDL, LDLDIRECT in the last 72 hours. Thyroid function studies Recent Labs    07/29/20 0258  TSH 0.810   Anemia work up No results for input(s): VITAMINB12, FOLATE, FERRITIN, TIBC, IRON, RETICCTPCT in the last 72 hours. Urinalysis No results found for: COLORURINE, APPEARANCEUR, [place], [place], GLUCOSEU, [place], [place], [place], PROTEINUR, UROBILINOGEN, NITRITE, LEUKOCYTESUR Sepsis Labs Invalid input(s): PROCALCITONIN,  WBC,  LACTICIDVEN Microbiology Recent Results (from the past 240 hour(s))  Resp Panel by RT-PCR (Flu A&B, Covid) Nasopharyngeal Swab     Status: None   Collection Time: 07/25/20  3:54 PM   Specimen: Nasopharyngeal Swab; Nasopharyngeal(NP) swabs in vial transport medium  Result Value Ref Range Status   SARS Coronavirus 2 by RT PCR NEGATIVE NEGATIVE Final    Comment: (NOTE) SARS-CoV-2 target nucleic acids are NOT DETECTED.  The SARS-CoV-2 RNA is generally detectable in upper respiratory specimens during the acute phase of infection. The  lowest concentration of SARS-CoV-2 viral copies this assay can detect is 138 copies/mL. A negative result does not preclude SARS-Cov-2 infection and should not be used as the sole basis for treatment or other patient management decisions. A negative result may occur with  improper specimen collection/handling, submission of specimen other than nasopharyngeal swab, presence of viral mutation(s) within the areas targeted by this assay, and inadequate number of viral copies(<138 copies/mL). A negative result must be combined with clinical observations, patient history, and epidemiological information. The expected result is Negative.  Fact Sheet for Patients:  EntrepreneurPulse.com.au  Fact Sheet for Healthcare Providers:  IncredibleEmployment.be  This test is no t yet approved or cleared by the [place] FDA and  has been authorized for detection and/or diagnosis of SARS-CoV-2 by FDA under an Emergency Use Authorization (EUA). This EUA will remain  in effect (meaning this test can be used) for the duration of the COVID-19 declaration under Section 564(b)(1) of the Act, 21 U.S.C.section 360bbb-3(b)(1), unless the authorization is terminated  or revoked sooner.       Influenza A by PCR NEGATIVE NEGATIVE Final   Influenza B by PCR NEGATIVE NEGATIVE Final    Comment: (NOTE) The Xpert Xpress SARS-CoV-2/FLU/RSV plus assay is intended as an aid in the diagnosis of influenza from Nasopharyngeal swab specimens and should not be used as a sole basis for treatment. Nasal washings and aspirates are unacceptable for Xpert Xpress SARS-CoV-2/FLU/RSV testing.  Fact Sheet for Patients: EntrepreneurPulse.com.au  Fact Sheet for Healthcare Providers: IncredibleEmployment.be  This test is not yet approved or cleared by the [place] FDA and has been authorized for detection and/or diagnosis of SARS-CoV-2 by FDA under  an Emergency Use Authorization (EUA). This EUA will remain in effect (meaning this test can be used) for the duration of the COVID-19 declaration under Section 564(b)(1) of the Act, 21 U.S.C. section 360bbb-3(b)(1), unless the authorization is terminated or revoked.  Performed at [facility], [address], [place], [place] [address]   Surgical pcr screen     Status: None   Collection Time: 07/26/20  4:32 AM   Specimen: Nasal Mucosa; Nasal Swab  Result Value Ref Range Status   MRSA, PCR NEGATIVE NEGATIVE Final  Staphylococcus aureus NEGATIVE NEGATIVE Final    Comment: (NOTE) The Xpert SA Assay (FDA approved for NASAL specimens in patients 35 years of age and older), is one component of a comprehensive surveillance program. It is not intended to diagnose infection nor to guide or monitor treatment. Performed at [facility], [place] [address] [place], [place] [address]      Time coordinating discharge: 45 minutes  SIGNED:   Tawni Millers, MD  [facility] 07/31/2020, 8:21 AM

## 2020-07-31 NOTE — Evaluation (Signed)
Occupational Therapy Re-Evaluation Patient Details Name: Kathleen Blankenship MRN: [id number] DOB: 08-Dec-1950 Today's Date: 07/31/2020    History of Present Illness Kathleen Blankenship is an 69 y.o. 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  PMH positive for COPD, rheumatoid arthritis, hypertension, thyroidectomy, depression, tobacco abuse.   Clinical Impression   Pt seen for OT re-evaluation s/p L TSA noted above. Educated pt on shoulder precautions, compensatory strategies for ADLs, allowed exercise program to maintain ROM, and safety at home. Pt verbalized understanding of all educatio and requests OT to come back for second session to educate pt's sister as well to maximize carryover for safe discharge home. Pt overall min guard for mobility to ensure safety, Mod A for UB ADLs due to precautions and min guard for LB ADLs. Pt did endorse nausea during session, vomited while OT present. Plan to check back later this AM when pt's sister present as requested.     Follow Up Recommendations  Home health OT;Supervision/Assistance - 24 hour    Equipment Recommendations  3 in 1 bedside commode    Recommendations for Other Services       Precautions / Restrictions Precautions Precautions: Fall;Shoulder Type of Shoulder Precautions: reverse Shoulder Interventions: Don joy ultra sling;At all times;Off for dressing/bathing/exercises Precaution Booklet Issued: Yes (comment) Precaution Comments: ok for AROM of hand, wrist elbow of L UE, no L shoulder movement. [place] for unrestricted ROM to R UE Required Braces or Orthoses: Sling Restrictions Weight Bearing Restrictions: Yes RUE Weight Bearing: Non weight bearing LUE Weight Bearing: Non weight bearing Other Position/Activity Restrictions: Per MD note: can perform unrestricted AROM of R elbow as of 07/27/2020       Mobility Bed Mobility Overal bed mobility: Needs Assistance Bed Mobility: Supine to Sit     Supine to sit: Modified independent (Device/Increase time);HOB elevated          Transfers Overall transfer level: Needs assistance Equipment used: None Transfers: Sit to/from Stand Sit to Stand: Supervision         General transfer comment: Supervision for safety, cueing for WB precautions with pt able to demo without difficulty     Balance Overall balance assessment: Needs assistance Sitting-balance support: No upper extremity supported;Feet supported Sitting balance-Leahy Scale: Fair     Standing balance support: No upper extremity supported Standing balance-Leahy Scale: Fair Standing balance comment: fair static standing during ADLs. mild unsteadiness during dynamic tasks but no overt LOB                           ADL either performed or assessed with clinical judgement   ADL Overall ADL's : Needs assistance/impaired Eating/Feeding: Set up Eating/Feeding Details (indicate cue type and reason): set up to open containers and cut foods Grooming: Supervision/safety;Standing;Oral care Grooming Details (indicate cue type and reason): Supervision for safety to perform denture care at sink. Assistance to open denture container Upper Body Bathing: Moderate assistance;Sitting   Lower Body Bathing: Min guard;Sit to/from stand;Sitting/lateral leans   Upper Body Dressing : Moderate assistance;Sitting   Lower Body Dressing: Min guard;Sit to/from stand;Sitting/lateral leans Lower Body Dressing Details (indicate cue type and reason): Able to don socks using figure four position and one hand sitting EOB Toilet Transfer: Min guard;Ambulation;BSC Toilet Transfer Details (indicate cue type and reason): min guard for safety, no LOB but pt reporting weakness Toileting- Clothing  Manipulation and Hygiene: Min guard;Sitting/lateral lean;Sit to/from stand Toileting - Clothing  Manipulation Details (indicate cue type and reason): min guard for safety in standing, increased time for clothing mgmt but pt able to complete     Functional mobility during ADLs: Min guard General ADL Comments: Educated pt on shoulder precautions during ADLs with compensatory strategies.      Vision Baseline Vision/History: Wears glasses Wears Glasses: At all times Patient Visual Report: No change from baseline Vision Assessment?: No apparent visual deficits     Perception     Praxis      Pertinent Vitals/Pain Pain Assessment: Faces Faces Pain Scale: Hurts a little bit Pain Location: L shoulder and  R elbow Pain Descriptors / Indicators: Operative site guarding;Aching Pain Intervention(s): Monitored during session;Repositioned;Other (comment) (pt declined ice)     Hand Dominance Right   Extremity/Trunk Assessment Upper Extremity Assessment Upper Extremity Assessment: RUE deficits/detail;LUE deficits/detail RUE Deficits / Details: incision open to air at elbow, mild swelling and redness. ROM WFL. hx of difficulty with fine motor/pinch due to RA RUE Sensation: WNL RUE Coordination: decreased gross motor;decreased fine motor LUE Deficits / Details: in don joy sling, hand, wrist and elbow ROM WFL. redness/swelling above elbow to shoulder..  hx of difficulty with fine motor/pinch due to RA LUE: Unable to fully assess due to immobilization LUE Coordination: decreased fine motor;decreased gross motor   Lower Extremity Assessment Lower Extremity Assessment: Defer to PT evaluation   Cervical / Trunk Assessment Cervical / Trunk Assessment: Normal   Communication Communication Communication: No difficulties   Cognition Arousal/Alertness: Awake/alert Behavior During Therapy: WFL for tasks assessed/performed Overall Cognitive Status: Within Functional Limits for tasks assessed                                 General Comments: pleasantly cooperative   General  Comments  Pt with nausea and vomiting at end of session    Exercises Exercises: General Upper Extremity General Exercises - Upper Extremity Elbow Flexion: AROM;Left;5 reps;Seated Elbow Extension: AROM;Left;5 reps;Seated Wrist Flexion: AROM;Left;5 reps;Seated Wrist Extension: AROM;Left;5 reps;Seated Digit Composite Flexion: AROM;Left;5 reps;Seated Composite Extension: AROM;Left;5 reps;Seated   Shoulder Instructions      Home Living Family/patient expects to be discharged to:: Private residence Living Arrangements: Alone Available Help at Discharge: Family;Friend(s);Available 24 hours/day Type of Home: Mobile home Home Access: Stairs to enter Entrance Stairs-Number of Steps: 5   Home Layout: One level     Bathroom Shower/Tub: Occupational psychologist: Standard     Home Equipment: Shower seat;Hand held shower head   Additional Comments: Family has arranged 24/7 assistance, provided shower chair      Prior Functioning/Environment Level of Independence: Independent        Comments: reports her friends and sister can assist at discharge        OT Problem List: Decreased strength;Decreased range of motion;Impaired balance (sitting and/or standing);Decreased activity tolerance;Decreased coordination;Decreased knowledge of use of DME or AE;Impaired UE functional use;Pain      OT Treatment/Interventions: Self-care/ADL training;Therapeutic exercise;Neuromuscular education;DME and/or AE instruction;Therapeutic activities;Patient/family education    OT Goals(Current goals can be found in the care plan section) Acute Rehab OT Goals Patient Stated Goal: go home safely OT Goal Formulation: With patient Time For Goal Achievement: 08/14/20 Potential to Achieve Goals: Good  OT Frequency: Min 2X/week   Barriers to D/C:            Co-evaluation  AM-PAC OT "6 Clicks" Daily Activity     Outcome Measure Help from another person eating meals?: A  Little Help from another person taking care of personal grooming?: A Little Help from another person toileting, which includes using toliet, bedpan, or urinal?: A Little Help from another person bathing (including washing, rinsing, drying)?: A Lot Help from another person to put on and taking off regular upper body clothing?: A Lot Help from another person to put on and taking off regular lower body clothing?: A Little 6 Click Score: 16   End of Session Equipment Utilized During Treatment: Other (comment) (sling) Nurse Communication: Mobility status;Other (comment) (vomiting)  Activity Tolerance: Patient tolerated treatment well Patient left: in chair;with call bell/phone within reach  OT Visit Diagnosis: Unsteadiness on feet (R26.81);Other abnormalities of gait and mobility (R26.89);Muscle weakness (generalized) (M62.81);History of falling (Z91.81);Pain Pain - Right/Left:  (R and L) Pain - part of body: Shoulder;Arm                Time: 0727-0826 OT Time Calculation (min): 59 min Charges:  OT General Charges $OT Visit: 1 Visit OT Evaluation $OT Re-eval: 1 Re-eval OT Treatments $Self Care/Home Management : 23-37 mins $Therapeutic Activity: 8-22 mins  Layla Maw, OTR/L  Layla Maw 07/31/2020, 8:42 AM

## 2020-07-31 NOTE — Care Management Important Message (Signed)
Important Message  Patient Details  Name: Kathleen Blankenship MRN: [id number] Date of Birth: 1950-09-17   Medicare Important Message Given:  Yes - Important Message mailed due to current National Emergency  Verbal consent obtained due to current National Emergency  Relationship to patient: Self Contact Name: Hanifa Antonetti Call Date: 07/31/20  Time: 1350 Phone: [phone] Outcome: No Answer/Busy Important Message mailed to: Patient address on file    Delorse Lek 07/31/2020, 2:56 PM

## 2020-08-01 ENCOUNTER — Encounter (HOSPITAL_COMMUNITY): Payer: Self-pay | Admitting: Orthopaedic Surgery

## 2020-08-04 DIAGNOSIS — M109 Gout, unspecified: Secondary | ICD-10-CM | POA: Diagnosis not present

## 2020-08-04 DIAGNOSIS — I2510 Atherosclerotic heart disease of native coronary artery without angina pectoris: Secondary | ICD-10-CM | POA: Diagnosis not present

## 2020-08-04 DIAGNOSIS — I119 Hypertensive heart disease without heart failure: Secondary | ICD-10-CM | POA: Diagnosis not present

## 2020-08-04 DIAGNOSIS — Z96612 Presence of left artificial shoulder joint: Secondary | ICD-10-CM | POA: Diagnosis not present

## 2020-08-04 DIAGNOSIS — J449 Chronic obstructive pulmonary disease, unspecified: Secondary | ICD-10-CM | POA: Diagnosis not present

## 2020-08-04 DIAGNOSIS — M069 Rheumatoid arthritis, unspecified: Secondary | ICD-10-CM | POA: Diagnosis not present

## 2020-08-04 DIAGNOSIS — S42401D Unspecified fracture of lower end of right humerus, subsequent encounter for fracture with routine healing: Secondary | ICD-10-CM | POA: Diagnosis not present

## 2020-08-04 DIAGNOSIS — S42212D Unspecified displaced fracture of surgical neck of left humerus, subsequent encounter for fracture with routine healing: Secondary | ICD-10-CM | POA: Diagnosis not present

## 2020-08-04 DIAGNOSIS — E890 Postprocedural hypothyroidism: Secondary | ICD-10-CM | POA: Diagnosis not present

## 2020-08-04 DIAGNOSIS — R69 Illness, unspecified: Secondary | ICD-10-CM | POA: Diagnosis not present

## 2020-08-07 DIAGNOSIS — Z96612 Presence of left artificial shoulder joint: Secondary | ICD-10-CM | POA: Diagnosis not present

## 2020-08-07 DIAGNOSIS — S42401D Unspecified fracture of lower end of right humerus, subsequent encounter for fracture with routine healing: Secondary | ICD-10-CM | POA: Diagnosis not present

## 2020-08-07 DIAGNOSIS — M109 Gout, unspecified: Secondary | ICD-10-CM | POA: Diagnosis not present

## 2020-08-07 DIAGNOSIS — E890 Postprocedural hypothyroidism: Secondary | ICD-10-CM | POA: Diagnosis not present

## 2020-08-07 DIAGNOSIS — I2510 Atherosclerotic heart disease of native coronary artery without angina pectoris: Secondary | ICD-10-CM | POA: Diagnosis not present

## 2020-08-07 DIAGNOSIS — I119 Hypertensive heart disease without heart failure: Secondary | ICD-10-CM | POA: Diagnosis not present

## 2020-08-07 DIAGNOSIS — S42212D Unspecified displaced fracture of surgical neck of left humerus, subsequent encounter for fracture with routine healing: Secondary | ICD-10-CM | POA: Diagnosis not present

## 2020-08-07 DIAGNOSIS — M069 Rheumatoid arthritis, unspecified: Secondary | ICD-10-CM | POA: Diagnosis not present

## 2020-08-07 DIAGNOSIS — J449 Chronic obstructive pulmonary disease, unspecified: Secondary | ICD-10-CM | POA: Diagnosis not present

## 2020-08-07 DIAGNOSIS — R69 Illness, unspecified: Secondary | ICD-10-CM | POA: Diagnosis not present

## 2020-08-08 DIAGNOSIS — S42292D Other displaced fracture of upper end of left humerus, subsequent encounter for fracture with routine healing: Secondary | ICD-10-CM | POA: Diagnosis not present

## 2020-08-08 DIAGNOSIS — S52271D Monteggia's fracture of right ulna, subsequent encounter for closed fracture with routine healing: Secondary | ICD-10-CM | POA: Diagnosis not present

## 2020-08-09 DIAGNOSIS — J449 Chronic obstructive pulmonary disease, unspecified: Secondary | ICD-10-CM | POA: Diagnosis not present

## 2020-08-09 DIAGNOSIS — S42212D Unspecified displaced fracture of surgical neck of left humerus, subsequent encounter for fracture with routine healing: Secondary | ICD-10-CM | POA: Diagnosis not present

## 2020-08-09 DIAGNOSIS — M069 Rheumatoid arthritis, unspecified: Secondary | ICD-10-CM | POA: Diagnosis not present

## 2020-08-09 DIAGNOSIS — Z96612 Presence of left artificial shoulder joint: Secondary | ICD-10-CM | POA: Diagnosis not present

## 2020-08-09 DIAGNOSIS — E890 Postprocedural hypothyroidism: Secondary | ICD-10-CM | POA: Diagnosis not present

## 2020-08-09 DIAGNOSIS — S42401D Unspecified fracture of lower end of right humerus, subsequent encounter for fracture with routine healing: Secondary | ICD-10-CM | POA: Diagnosis not present

## 2020-08-09 DIAGNOSIS — I119 Hypertensive heart disease without heart failure: Secondary | ICD-10-CM | POA: Diagnosis not present

## 2020-08-09 DIAGNOSIS — M109 Gout, unspecified: Secondary | ICD-10-CM | POA: Diagnosis not present

## 2020-08-09 DIAGNOSIS — I2510 Atherosclerotic heart disease of native coronary artery without angina pectoris: Secondary | ICD-10-CM | POA: Diagnosis not present

## 2020-08-09 DIAGNOSIS — R69 Illness, unspecified: Secondary | ICD-10-CM | POA: Diagnosis not present

## 2020-08-10 DIAGNOSIS — R69 Illness, unspecified: Secondary | ICD-10-CM | POA: Diagnosis not present

## 2020-08-10 DIAGNOSIS — S42212D Unspecified displaced fracture of surgical neck of left humerus, subsequent encounter for fracture with routine healing: Secondary | ICD-10-CM | POA: Diagnosis not present

## 2020-08-10 DIAGNOSIS — M109 Gout, unspecified: Secondary | ICD-10-CM | POA: Diagnosis not present

## 2020-08-10 DIAGNOSIS — S42401D Unspecified fracture of lower end of right humerus, subsequent encounter for fracture with routine healing: Secondary | ICD-10-CM | POA: Diagnosis not present

## 2020-08-10 DIAGNOSIS — I2510 Atherosclerotic heart disease of native coronary artery without angina pectoris: Secondary | ICD-10-CM | POA: Diagnosis not present

## 2020-08-10 DIAGNOSIS — Z96612 Presence of left artificial shoulder joint: Secondary | ICD-10-CM | POA: Diagnosis not present

## 2020-08-10 DIAGNOSIS — I119 Hypertensive heart disease without heart failure: Secondary | ICD-10-CM | POA: Diagnosis not present

## 2020-08-10 DIAGNOSIS — M069 Rheumatoid arthritis, unspecified: Secondary | ICD-10-CM | POA: Diagnosis not present

## 2020-08-10 DIAGNOSIS — E890 Postprocedural hypothyroidism: Secondary | ICD-10-CM | POA: Diagnosis not present

## 2020-08-10 DIAGNOSIS — J449 Chronic obstructive pulmonary disease, unspecified: Secondary | ICD-10-CM | POA: Diagnosis not present

## 2020-08-11 DIAGNOSIS — M069 Rheumatoid arthritis, unspecified: Secondary | ICD-10-CM | POA: Diagnosis not present

## 2020-08-11 DIAGNOSIS — J449 Chronic obstructive pulmonary disease, unspecified: Secondary | ICD-10-CM | POA: Diagnosis not present

## 2020-08-11 DIAGNOSIS — I119 Hypertensive heart disease without heart failure: Secondary | ICD-10-CM | POA: Diagnosis not present

## 2020-08-11 DIAGNOSIS — I2510 Atherosclerotic heart disease of native coronary artery without angina pectoris: Secondary | ICD-10-CM | POA: Diagnosis not present

## 2020-08-11 DIAGNOSIS — Z96612 Presence of left artificial shoulder joint: Secondary | ICD-10-CM | POA: Diagnosis not present

## 2020-08-11 DIAGNOSIS — R69 Illness, unspecified: Secondary | ICD-10-CM | POA: Diagnosis not present

## 2020-08-11 DIAGNOSIS — S42401D Unspecified fracture of lower end of right humerus, subsequent encounter for fracture with routine healing: Secondary | ICD-10-CM | POA: Diagnosis not present

## 2020-08-11 DIAGNOSIS — E890 Postprocedural hypothyroidism: Secondary | ICD-10-CM | POA: Diagnosis not present

## 2020-08-11 DIAGNOSIS — M109 Gout, unspecified: Secondary | ICD-10-CM | POA: Diagnosis not present

## 2020-08-11 DIAGNOSIS — S42212D Unspecified displaced fracture of surgical neck of left humerus, subsequent encounter for fracture with routine healing: Secondary | ICD-10-CM | POA: Diagnosis not present

## 2020-08-17 DIAGNOSIS — I119 Hypertensive heart disease without heart failure: Secondary | ICD-10-CM | POA: Diagnosis not present

## 2020-08-17 DIAGNOSIS — R69 Illness, unspecified: Secondary | ICD-10-CM | POA: Diagnosis not present

## 2020-08-17 DIAGNOSIS — E890 Postprocedural hypothyroidism: Secondary | ICD-10-CM | POA: Diagnosis not present

## 2020-08-17 DIAGNOSIS — S42212D Unspecified displaced fracture of surgical neck of left humerus, subsequent encounter for fracture with routine healing: Secondary | ICD-10-CM | POA: Diagnosis not present

## 2020-08-17 DIAGNOSIS — M069 Rheumatoid arthritis, unspecified: Secondary | ICD-10-CM | POA: Diagnosis not present

## 2020-08-17 DIAGNOSIS — S42401D Unspecified fracture of lower end of right humerus, subsequent encounter for fracture with routine healing: Secondary | ICD-10-CM | POA: Diagnosis not present

## 2020-08-17 DIAGNOSIS — M109 Gout, unspecified: Secondary | ICD-10-CM | POA: Diagnosis not present

## 2020-08-17 DIAGNOSIS — Z96612 Presence of left artificial shoulder joint: Secondary | ICD-10-CM | POA: Diagnosis not present

## 2020-08-17 DIAGNOSIS — J449 Chronic obstructive pulmonary disease, unspecified: Secondary | ICD-10-CM | POA: Diagnosis not present

## 2020-08-17 DIAGNOSIS — I2510 Atherosclerotic heart disease of native coronary artery without angina pectoris: Secondary | ICD-10-CM | POA: Diagnosis not present

## 2020-08-21 DIAGNOSIS — S42292D Other displaced fracture of upper end of left humerus, subsequent encounter for fracture with routine healing: Secondary | ICD-10-CM | POA: Diagnosis not present

## 2020-08-23 DIAGNOSIS — Z96612 Presence of left artificial shoulder joint: Secondary | ICD-10-CM | POA: Diagnosis not present

## 2020-08-23 DIAGNOSIS — M069 Rheumatoid arthritis, unspecified: Secondary | ICD-10-CM | POA: Diagnosis not present

## 2020-08-23 DIAGNOSIS — I2510 Atherosclerotic heart disease of native coronary artery without angina pectoris: Secondary | ICD-10-CM | POA: Diagnosis not present

## 2020-08-23 DIAGNOSIS — S42212D Unspecified displaced fracture of surgical neck of left humerus, subsequent encounter for fracture with routine healing: Secondary | ICD-10-CM | POA: Diagnosis not present

## 2020-08-23 DIAGNOSIS — J449 Chronic obstructive pulmonary disease, unspecified: Secondary | ICD-10-CM | POA: Diagnosis not present

## 2020-08-23 DIAGNOSIS — R69 Illness, unspecified: Secondary | ICD-10-CM | POA: Diagnosis not present

## 2020-08-23 DIAGNOSIS — S42401D Unspecified fracture of lower end of right humerus, subsequent encounter for fracture with routine healing: Secondary | ICD-10-CM | POA: Diagnosis not present

## 2020-08-23 DIAGNOSIS — I119 Hypertensive heart disease without heart failure: Secondary | ICD-10-CM | POA: Diagnosis not present

## 2020-08-23 DIAGNOSIS — E890 Postprocedural hypothyroidism: Secondary | ICD-10-CM | POA: Diagnosis not present

## 2020-08-23 DIAGNOSIS — M109 Gout, unspecified: Secondary | ICD-10-CM | POA: Diagnosis not present

## 2020-08-24 DIAGNOSIS — J449 Chronic obstructive pulmonary disease, unspecified: Secondary | ICD-10-CM | POA: Diagnosis not present

## 2020-08-24 DIAGNOSIS — S42212D Unspecified displaced fracture of surgical neck of left humerus, subsequent encounter for fracture with routine healing: Secondary | ICD-10-CM | POA: Diagnosis not present

## 2020-08-24 DIAGNOSIS — I2510 Atherosclerotic heart disease of native coronary artery without angina pectoris: Secondary | ICD-10-CM | POA: Diagnosis not present

## 2020-08-24 DIAGNOSIS — E890 Postprocedural hypothyroidism: Secondary | ICD-10-CM | POA: Diagnosis not present

## 2020-08-24 DIAGNOSIS — Z96612 Presence of left artificial shoulder joint: Secondary | ICD-10-CM | POA: Diagnosis not present

## 2020-08-24 DIAGNOSIS — R69 Illness, unspecified: Secondary | ICD-10-CM | POA: Diagnosis not present

## 2020-08-24 DIAGNOSIS — M069 Rheumatoid arthritis, unspecified: Secondary | ICD-10-CM | POA: Diagnosis not present

## 2020-08-24 DIAGNOSIS — I119 Hypertensive heart disease without heart failure: Secondary | ICD-10-CM | POA: Diagnosis not present

## 2020-08-24 DIAGNOSIS — S42401D Unspecified fracture of lower end of right humerus, subsequent encounter for fracture with routine healing: Secondary | ICD-10-CM | POA: Diagnosis not present

## 2020-08-24 DIAGNOSIS — M109 Gout, unspecified: Secondary | ICD-10-CM | POA: Diagnosis not present

## 2020-08-25 DIAGNOSIS — J449 Chronic obstructive pulmonary disease, unspecified: Secondary | ICD-10-CM | POA: Diagnosis not present

## 2020-08-25 DIAGNOSIS — R69 Illness, unspecified: Secondary | ICD-10-CM | POA: Diagnosis not present

## 2020-08-25 DIAGNOSIS — S42401D Unspecified fracture of lower end of right humerus, subsequent encounter for fracture with routine healing: Secondary | ICD-10-CM | POA: Diagnosis not present

## 2020-08-25 DIAGNOSIS — M109 Gout, unspecified: Secondary | ICD-10-CM | POA: Diagnosis not present

## 2020-08-25 DIAGNOSIS — S42212D Unspecified displaced fracture of surgical neck of left humerus, subsequent encounter for fracture with routine healing: Secondary | ICD-10-CM | POA: Diagnosis not present

## 2020-08-25 DIAGNOSIS — I2510 Atherosclerotic heart disease of native coronary artery without angina pectoris: Secondary | ICD-10-CM | POA: Diagnosis not present

## 2020-08-25 DIAGNOSIS — I119 Hypertensive heart disease without heart failure: Secondary | ICD-10-CM | POA: Diagnosis not present

## 2020-08-25 DIAGNOSIS — M069 Rheumatoid arthritis, unspecified: Secondary | ICD-10-CM | POA: Diagnosis not present

## 2020-08-25 DIAGNOSIS — E890 Postprocedural hypothyroidism: Secondary | ICD-10-CM | POA: Diagnosis not present

## 2020-08-25 DIAGNOSIS — Z96612 Presence of left artificial shoulder joint: Secondary | ICD-10-CM | POA: Diagnosis not present

## 2021-01-10 ENCOUNTER — Telehealth: Payer: Self-pay

## 2021-01-10 NOTE — Telephone Encounter (Signed)
Left message for patient to notify them that it is time to schedule annual low dose lung cancer screening CT scan. Instructed patient to call back ([phone]) to verify information and schedule.  

## 2021-03-05 ENCOUNTER — Telehealth: Payer: Self-pay | Admitting: *Deleted

## 2021-03-05 NOTE — Telephone Encounter (Signed)
Left message for patient to notify them that it is time to schedule annual low dose lung cancer screening CT scan. Instructed patient to call back ([phone]) to verify information and schedule.  

## 2021-08-15 ENCOUNTER — Telehealth: Payer: Self-pay | Admitting: Acute Care

## 2021-08-15 NOTE — Telephone Encounter (Signed)
Left message for pt to call regarding scheduling f/u lung screening CT. Call back number given.

## 2021-10-03 ENCOUNTER — Other Ambulatory Visit: Payer: Self-pay | Admitting: Family Medicine

## 2021-10-04 ENCOUNTER — Other Ambulatory Visit: Payer: Self-pay | Admitting: Family Medicine

## 2021-10-04 DIAGNOSIS — M5416 Radiculopathy, lumbar region: Secondary | ICD-10-CM

## 2021-10-04 DIAGNOSIS — M4856XA Collapsed vertebra, not elsewhere classified, lumbar region, initial encounter for fracture: Secondary | ICD-10-CM

## 2021-10-08 ENCOUNTER — Ambulatory Visit
Admission: RE | Admit: 2021-10-08 | Discharge: 2021-10-08 | Disposition: A | Discharge status: Home / Self Care | Payer: Medicare HMO | Source: Ambulatory Visit | Attending: Family Medicine | Admitting: Family Medicine

## 2021-10-08 DIAGNOSIS — M4856XA Collapsed vertebra, not elsewhere classified, lumbar region, initial encounter for fracture: Secondary | ICD-10-CM

## 2021-10-08 DIAGNOSIS — M5416 Radiculopathy, lumbar region: Secondary | ICD-10-CM

## 2021-10-08 IMAGING — MR MR LUMBAR SPINE W/O CM
5 series · 33 of 48 positions shown · non-contrast
Comparison: None.

CLINICAL DATA: Lower back pain radiating to the left lower
extremity

EXAM:
MRI LUMBAR SPINE WITHOUT CONTRAST
TECHNIQUE: Multiplanar, multisequence MR imaging of the lumbar spine was
performed. No intravenous contrast was administered.

[Series 3: T1 · sagittal · 4.0mm · 0.41mm/px · 6 of 15 slices shown (1 of 2)]
[im 1/15]
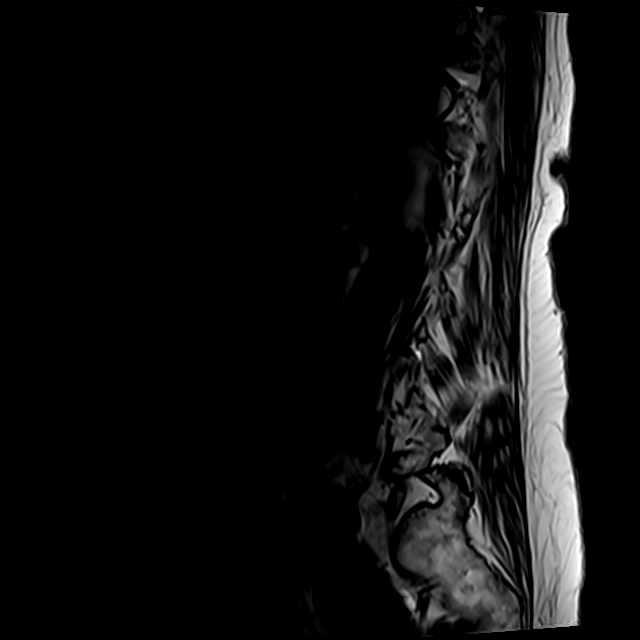
[im 3/15]
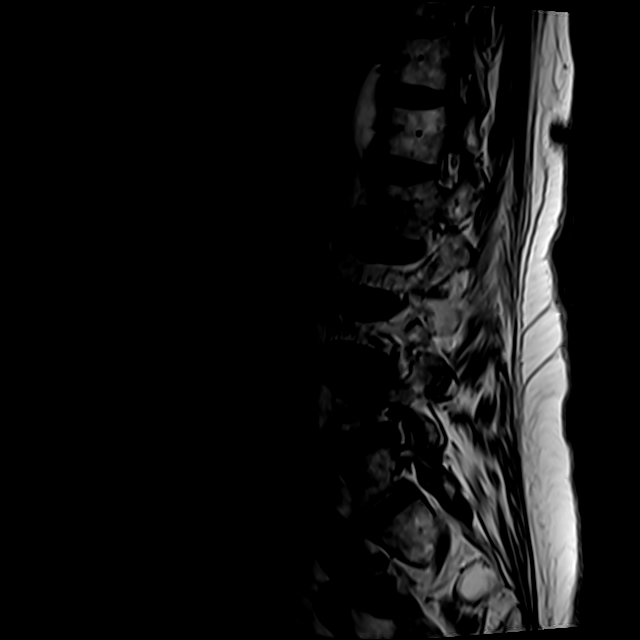
[im 6/15]
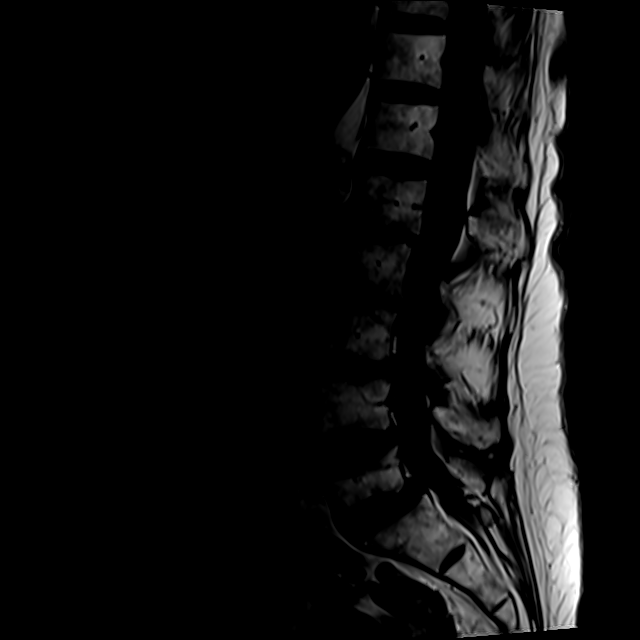
[im 9/15]
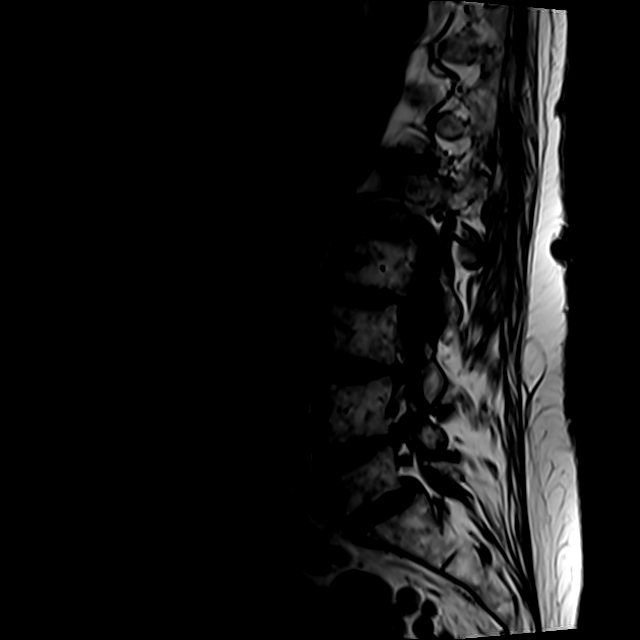
[im 12/15]
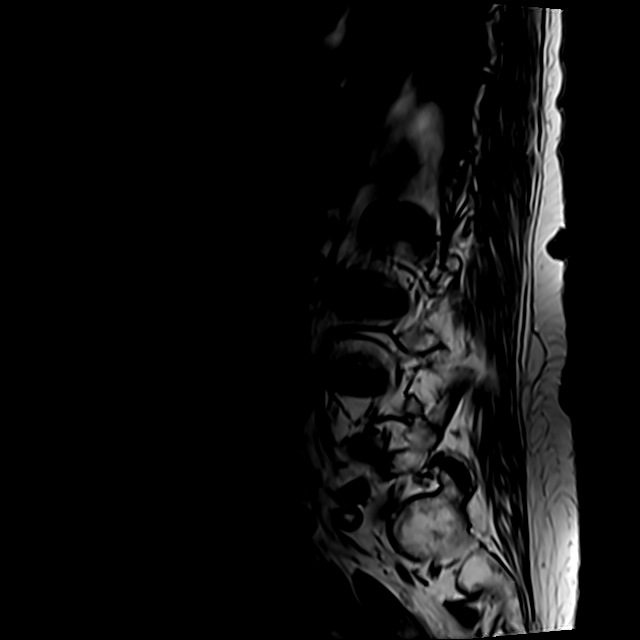
[im 15/15]
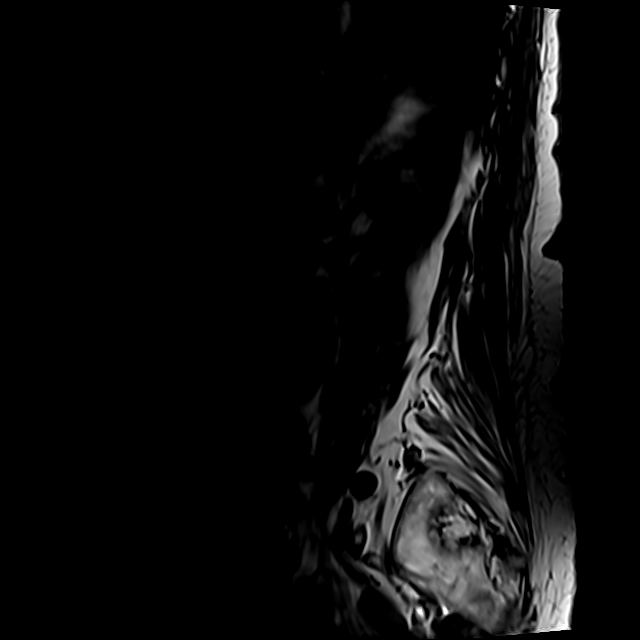

[Series 4: STIR · sagittal · 4.0mm · 0.51mm/px · 3 of 15 slices shown]
[im 1/15]
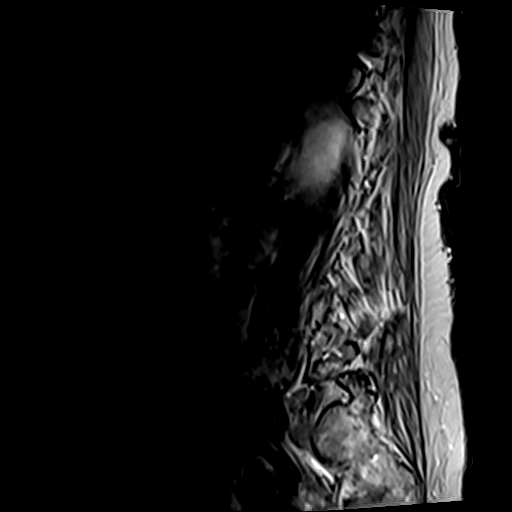
[im 3/15]
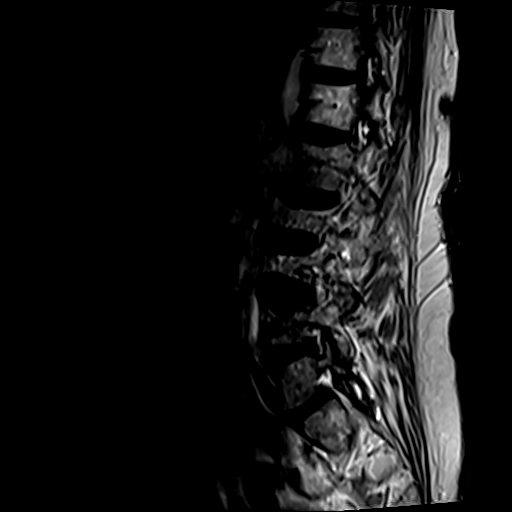
[im 6/15]
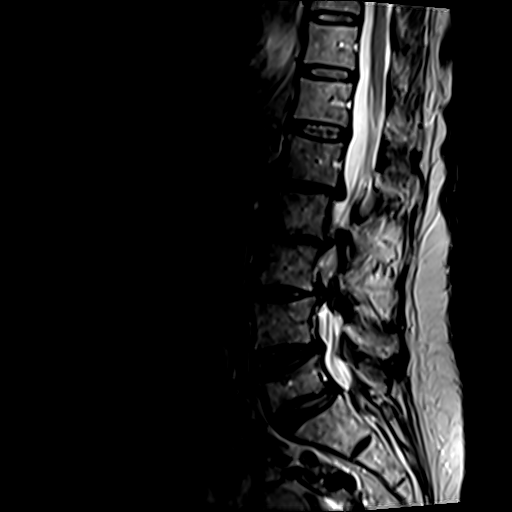

[Series 5: T2 · sagittal · 4.0mm · 1.02mm/px · 6 of 15 slices shown (1 of 2)]
[im 1/15]
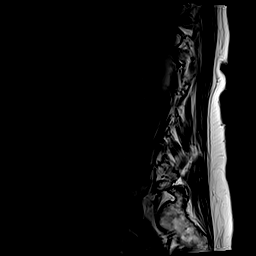
[im 3/15]
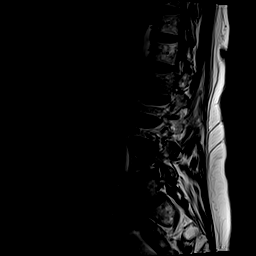
[im 6/15]
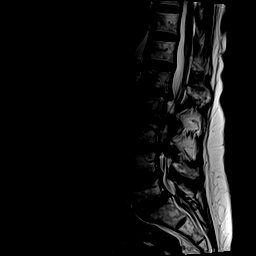
[im 9/15]
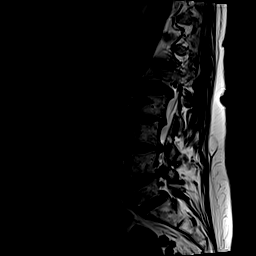
[im 12/15]
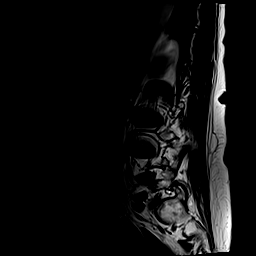
[im 15/15]
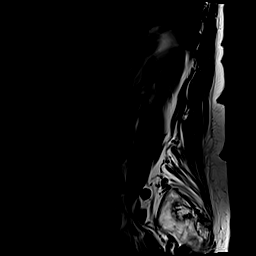

[Series 6: T1 · axial · 4.0mm · 0.78mm/px · z∈[-20,+171]mm · 9 of 36 slices shown (2 of 2)]
[im 1/36]
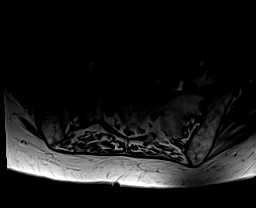
[im 6/36]
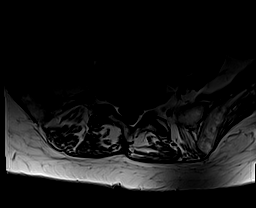
[im 11/36]
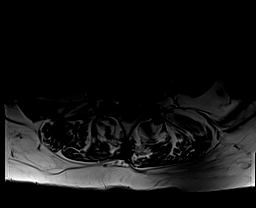
[im 16/36]
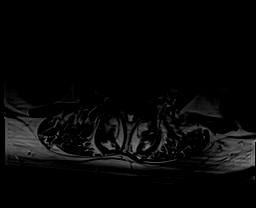
[im 18/36]
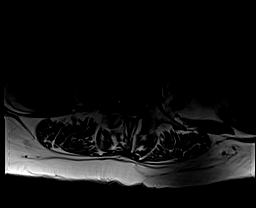
[im 21/36]
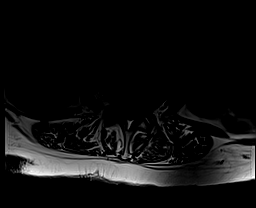
[im 26/36]
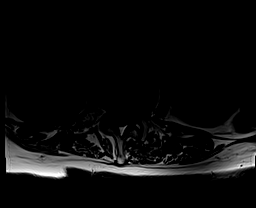
[im 31/36]
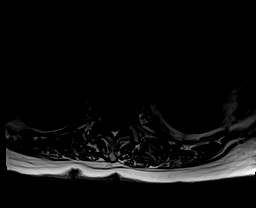
[im 36/36]
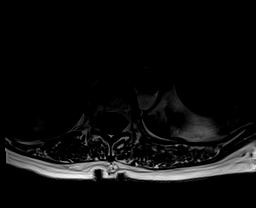

[Series 7: T2 · axial · 4.0mm · 0.78mm/px · z∈[-20,+171]mm · 9 of 36 slices shown (2 of 2)]
[im 1/36]
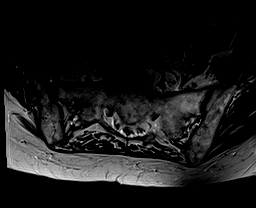
[im 6/36]
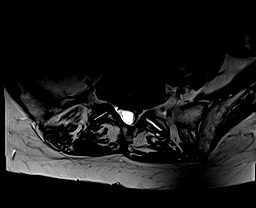
[im 11/36]
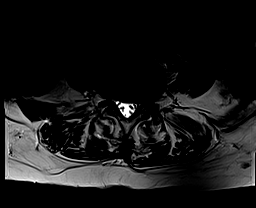
[im 16/36]
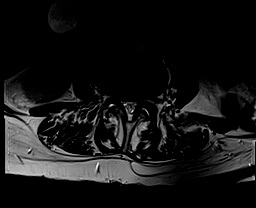
[im 18/36]
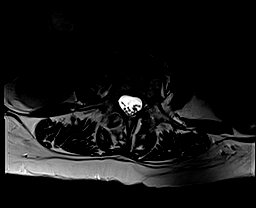
[im 21/36]
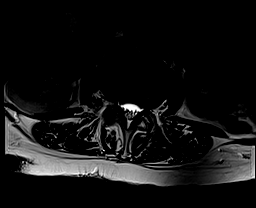
[im 26/36]
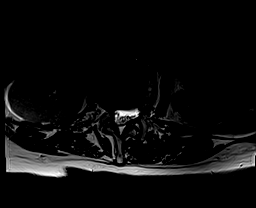
[im 31/36]
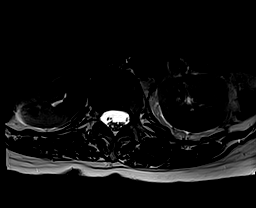
[im 36/36]
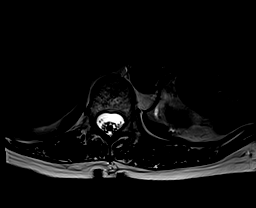

[33 of 48 positions shown; findings below may reference images not displayed]

FINDINGS: Segmentation:  5 lumbar type vertebrae

Alignment: Degenerative anterolisthesis at L5-S1 and retrolisthesis
at L1-2. mild scoliosis.

Vertebrae: No recent fracture, evidence of discitis, or bone lesion.
Remote L5 compression fracture with moderate depression.

Conus medullaris and cauda equina: Conus extends to the T12-L1
level. Conus and cauda equina appear normal.

Paraspinal and other soft tissues: Sigmoid diverticulosis.
Hypointense rounded filling defect at the distal CBD measuring 4 mm

Disc levels:

T12- L1: Unremarkable.

L1-L2: Disc narrowing and bulging with endplate and facet spurring
eccentric to the right. Moderate right foraminal impingement. Right
subarticular recess narrowing without L2 compression.

L2-L3: Disc narrowing and bulging with endplate and facet spurring
eccentric to the right. Moderate right foraminal stenosis. Mild
spinal stenosis

L3-L4: Disc narrowing and bulging eccentric to the right. Asymmetric
right facet spurring. Ligamentum flavum thickening. High-grade
spinal stenosis. Asymmetric right L4 impingement the subarticular
recess. Mild to moderate right foraminal narrowing

L4-L5: Disc narrowing and bulging with facet spurring asymmetric to
the left. Moderate left foraminal impingement.

L5-S1:Facet spurring and anterolisthesis. Small anterior synovial
cyst on the left. Disc space narrowing and bulging with mild left
foraminal stenosis.
IMPRESSION: 1. Advanced lumbar spine degeneration with scoliosis and L1-2, L5-S1
listhesis.
2. L3-4 high-grade spinal stenosis. Right more than left
subarticular recess impingement on L4.
3. Moderate foraminal narrowing on the right at L1-2 to L3-4 and on
the left at L4-5.
4. Remote L5 compression fracture, healed.
5. 4 mm distal CBD gallstone, recommend GI follow-up.

## 2021-10-23 ENCOUNTER — Other Ambulatory Visit: Payer: Self-pay | Admitting: Surgery

## 2021-10-23 DIAGNOSIS — K8050 Calculus of bile duct without cholangitis or cholecystitis without obstruction: Secondary | ICD-10-CM

## 2021-11-02 ENCOUNTER — Ambulatory Visit
Admission: RE | Admit: 2021-11-02 | Discharge: 2021-11-02 | Disposition: A | Discharge status: Home / Self Care | Payer: Medicare HMO | Source: Ambulatory Visit | Attending: Surgery | Admitting: Surgery

## 2021-11-02 ENCOUNTER — Other Ambulatory Visit: Payer: Self-pay

## 2021-11-02 ENCOUNTER — Other Ambulatory Visit: Payer: Self-pay | Admitting: Surgery

## 2021-11-02 DIAGNOSIS — K8050 Calculus of bile duct without cholangitis or cholecystitis without obstruction: Secondary | ICD-10-CM

## 2021-11-02 IMAGING — MR MR ABDOMEN WO/W CM MRCP
19 of 20 series · 45 of 48 positions shown · IV contrast (6ml Gadavist)
Comparison: None.

CLINICAL DATA: Choledocholithiasis

EXAM:
MRI ABDOMEN WITHOUT AND WITH CONTRAST (INCLUDING MRCP)
TECHNIQUE: Multiplanar multisequence MR imaging of the abdomen was performed
both before and after the administration of intravenous contrast.
Heavily T2-weighted images of the biliary and pancreatic ducts were
obtained, and three-dimensional MRCP images were rendered by post
processing.
CONTRAST:  7.5mL GADAVIST GADOBUTROL 1 MMOL/ML IV SOLN

[Series 3: T2 · coronal · 6.0mm · 1.12mm/px · 1 of 30 slices shown (1 of 2)]
[im 1/30]
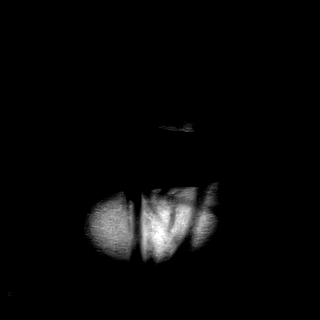

[Series 4: T2 · axial · 6.0mm · 1.12mm/px · 1 of 32 slices shown (2 of 2)]
[im 1/32]
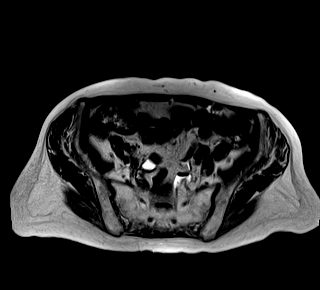

[Series 5: T1 · axial · 3.0mm · 1.12mm/px · z∈[-112,+101]mm · 2 of 72 slices shown (1 of 2)]
[im 1/72]
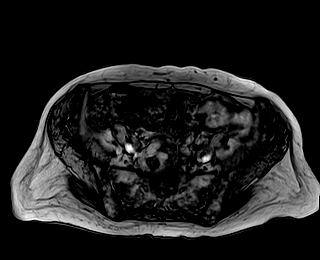
[im 72/72]
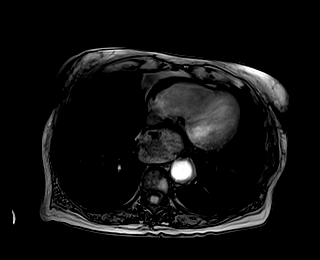

[Series 6: T1 · axial · 3.0mm · 1.12mm/px · z∈[-112,+101]mm · 3 of 72 slices shown (2 of 2)]
[im 1/72]
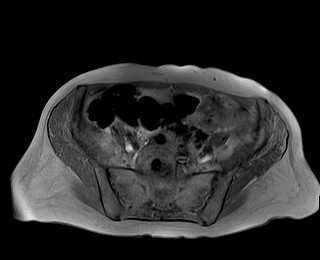
[im 36/72]
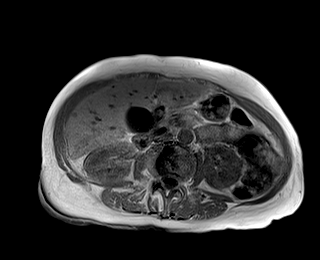
[im 72/72]
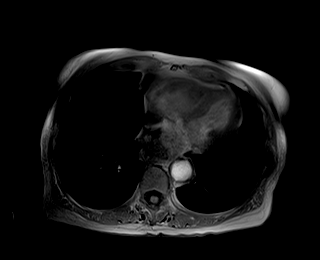

[Series 9: T2 fat-sat · axial · 6.0mm · 1.12mm/px · 1 of 30 slices shown]
[im 1/30]
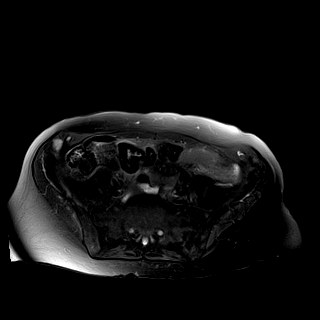

[Series 10: ax dwi_tracew · axial · 6.0mm · 1.34mm/px · z∈[-100,+109]mm · 4 of 90 slices shown]
[im 1/90]
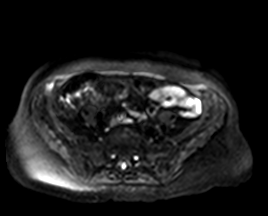
[im 30/90]
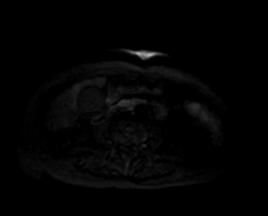
[im 60/90]
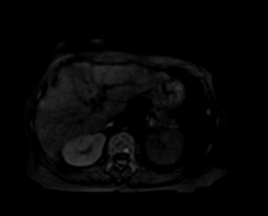
[im 90/90]
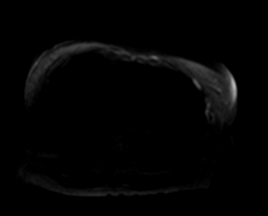

[Series 11: ax dwi_adc · axial · 6.0mm · 1.34mm/px · 1 of 30 slices shown]
[im 1/30]
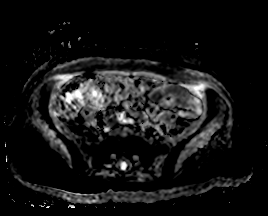

[Series 15: MRCP · coronal · 3.0mm · 1.12mm/px · 1 of 18 slices shown]
[im 1/18]
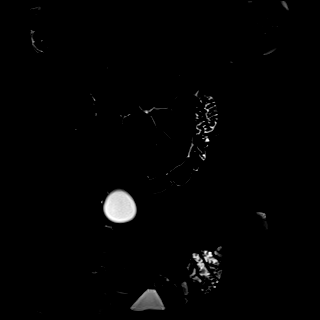

[Series 16: radials · coronal · 50.0mm · 0.78mm/px · 1 of 5 slices shown]
[im 1/5]
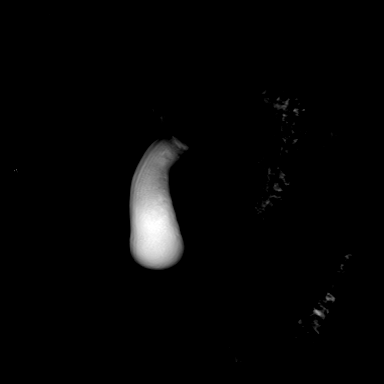

[Series 17: T1 dynamic fat-sat · axial · non-contrast · 3.0mm · 1.12mm/px · z∈[-112,+101]mm · 3 of 72 slices shown (1 of 5)]
[im 1/72]
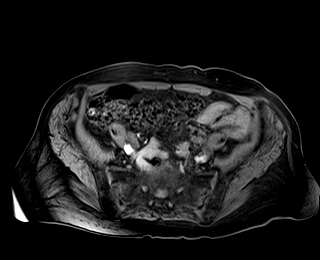
[im 36/72]
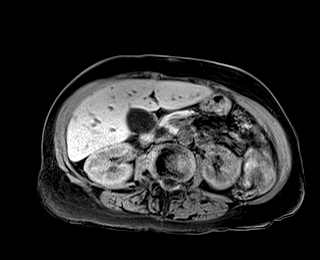
[im 72/72]
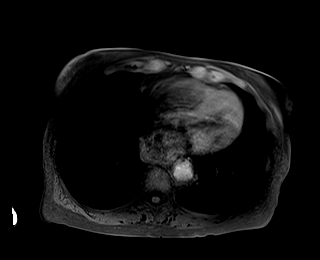

[Series 18: T1 dynamic fat-sat post-contrast · axial · 3.0mm · 1.12mm/px · z∈[-112,+101]mm · 3 of 72 slices shown (1 of 4)]
[im 1/72]
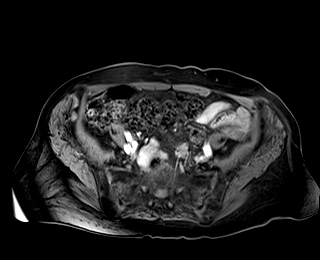
[im 36/72]
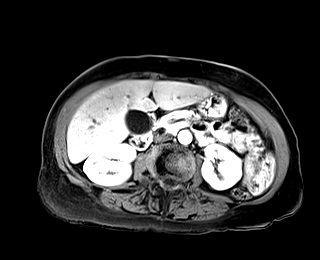
[im 72/72]
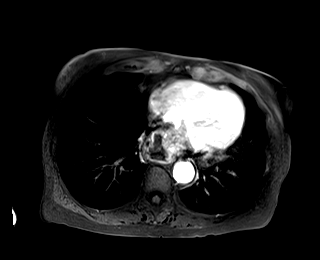

[Series 19: T1 dynamic fat-sat · axial · 3.0mm · 1.12mm/px · z∈[-112,+101]mm · 3 of 72 slices shown (2 of 5)]
[im 1/72]
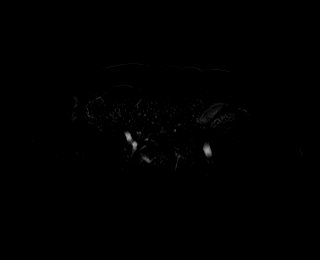
[im 36/72]
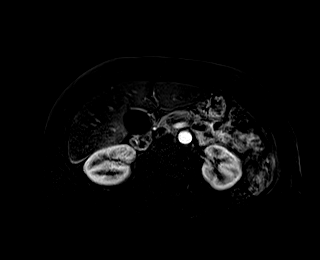
[im 72/72]
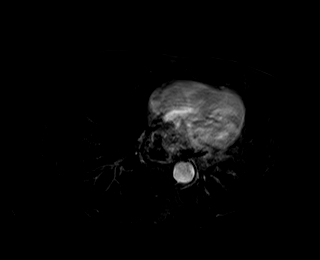

[Series 20: T1 dynamic fat-sat post-contrast · axial · 3.0mm · 1.12mm/px · z∈[-112,+101]mm · 3 of 72 slices shown (2 of 4)]
[im 1/72]
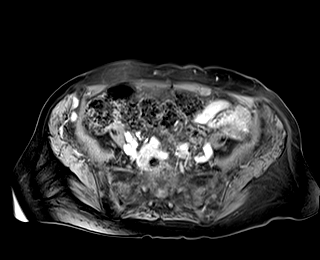
[im 36/72]
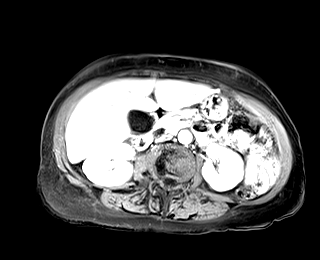
[im 72/72]
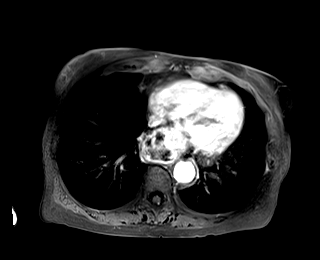

[Series 21: T1 dynamic fat-sat · axial · 3.0mm · 1.12mm/px · z∈[-112,+101]mm · 3 of 72 slices shown (3 of 5)]
[im 1/72]
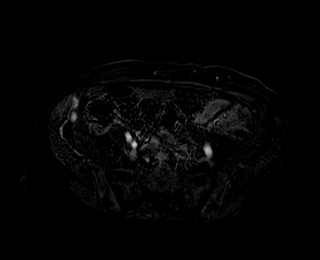
[im 36/72]
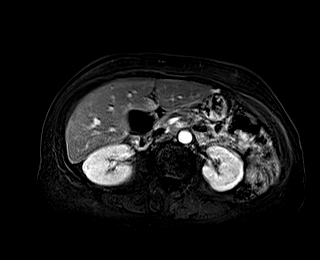
[im 72/72]
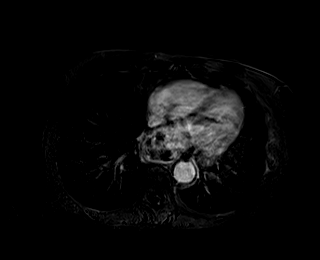

[Series 22: T1 dynamic fat-sat post-contrast · axial · 3.0mm · 1.12mm/px · z∈[-112,+101]mm · 3 of 72 slices shown (3 of 4)]
[im 1/72]
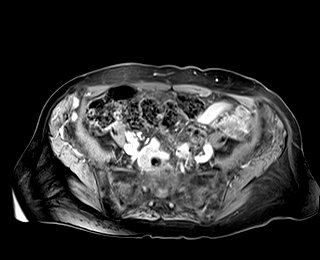
[im 36/72]
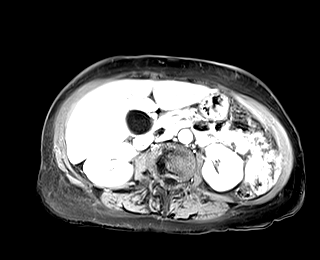
[im 72/72]
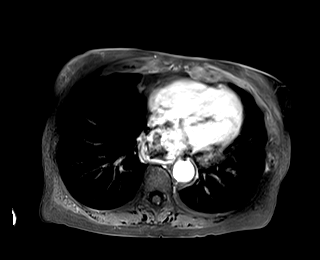

[Series 23: T1 dynamic fat-sat · axial · 3.0mm · 1.12mm/px · z∈[-112,+101]mm · 3 of 72 slices shown (4 of 5)]
[im 1/72]
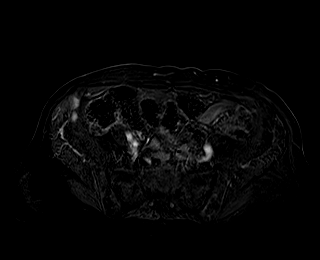
[im 36/72]
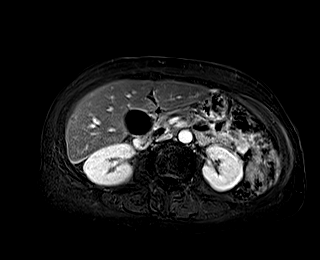
[im 72/72]
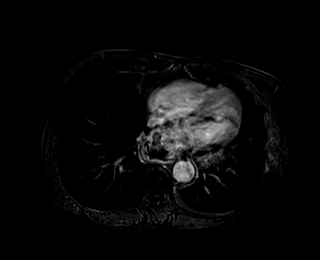

[Series 24: T1 dynamic post-contrast · coronal · 3.0mm · 1.12mm/px · 3 of 64 slices shown]
[im 1/64]
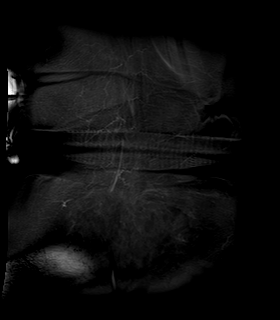
[im 32/64]
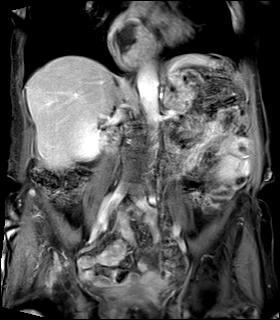
[im 64/64]
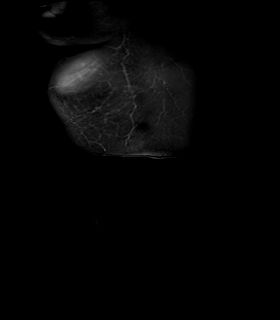

[Series 25: T1 dynamic fat-sat post-contrast · axial · 3.0mm · 1.12mm/px · z∈[-112,+101]mm · 3 of 72 slices shown (4 of 4)]
[im 1/72]
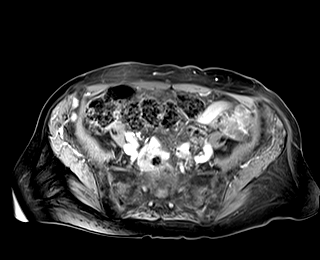
[im 36/72]
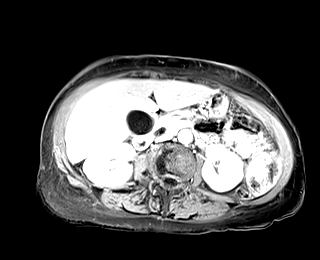
[im 72/72]
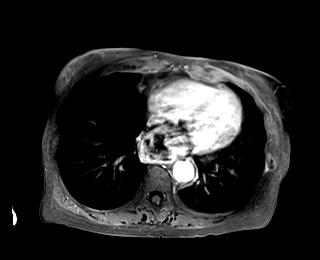

[Series 26: T1 dynamic fat-sat · axial · 3.0mm · 1.12mm/px · z∈[-112,+101]mm · 3 of 72 slices shown (5 of 5)]
[im 1/72]
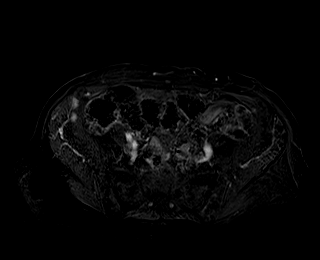
[im 36/72]
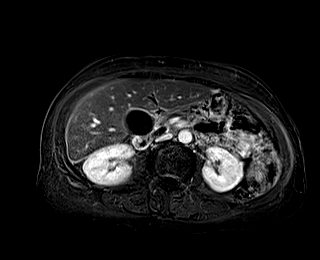
[im 72/72]
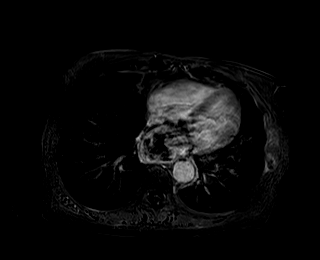

[45 of 48 positions shown; findings below may reference images not displayed]

FINDINGS: Lower chest: No acute findings.

Hepatobiliary: Liver is normal in size and contour. There is a
cm mildly hyperintense T2, hypointense T1 signal lesion identified
in the medial aspect of the right hepatic lobe segment 6 which
demonstrates gradual enhancement which is best visualized on the
delayed sequence. There is also a 6 mm hyperintense T2 signal focus
in the subcapsular right hepatic lobe segment 5 which demonstrates
early arterial enhancement and becomes isointense. Gallbladder is
significantly distended and contains sludge and a few small stones.
No intrahepatic biliary ductal dilatation. 6 mm stone in the distal
common bile duct. The CBD is dilated measuring up to 10 mm in
diameter.

Pancreas: Mild dilatation of the pancreatic duct measuring 4 mm in
diameter. 8 mm hyperintense T2 signal cyst in the head of the
pancreas just anterior to the CBD, without any suspicious
enhancement. No definite communication with the pancreatic duct
appreciated.

Spleen:  Within normal limits in size and appearance.

Adrenals/Urinary Tract: No masses identified. No evidence of
hydronephrosis.

Stomach/Bowel: Moderate hiatal hernia. No evidence of bowel
obstruction.

Vascular/Lymphatic: No pathologically enlarged lymph nodes
identified. No abdominal aortic aneurysm demonstrated.

Other:  No ascites.

Musculoskeletal: No suspicious bone lesions identified.
IMPRESSION: 1. Choledocholithiasis and dilated CBD measuring 10 mm diameter.
Consider ERCP.
2. Distended gallbladder containing sludge and a few small stones.
3. 8 mm cystic lesion in the head of the pancreas with no suspicious
features identified. Consider follow-up MRI in 12 months.
4. Small enhancing lesions in the right hepatic lobe as described,
most likely represent hemangiomas and can be followed on follow-up
MRI.
5. Moderate hiatal hernia.

## 2021-11-02 MED ORDER — GADOBUTROL 1 MMOL/ML IV SOLN
6.0000 mL | Freq: Once | INTRAVENOUS | Status: AC | PRN
  Administered 2021-11-02: 7.5 mL via INTRAVENOUS

## 2021-11-08 ENCOUNTER — Telehealth: Payer: Self-pay

## 2021-11-08 NOTE — Telephone Encounter (Signed)
Scheduled for 02/18/2022 ?

## 2021-11-19 ENCOUNTER — Other Ambulatory Visit: Payer: Self-pay

## 2021-11-19 DIAGNOSIS — K8050 Calculus of bile duct without cholangitis or cholecystitis without obstruction: Secondary | ICD-10-CM

## 2021-11-22 ENCOUNTER — Encounter
Admission: RE | Disposition: A | Discharge status: Home / Self Care | Payer: Self-pay | Source: Home / Self Care | Attending: Gastroenterology

## 2021-11-22 ENCOUNTER — Ambulatory Visit: Payer: Medicare HMO | Admitting: Registered Nurse

## 2021-11-22 ENCOUNTER — Encounter: Payer: Self-pay | Admitting: Gastroenterology

## 2021-11-22 ENCOUNTER — Ambulatory Visit
Admission: RE | Admit: 2021-11-22 | Discharge: 2021-11-22 | Disposition: A | Discharge status: Home / Self Care | Payer: Medicare HMO | Attending: Gastroenterology | Admitting: Gastroenterology

## 2021-11-22 ENCOUNTER — Ambulatory Visit: Payer: Medicare HMO

## 2021-11-22 DIAGNOSIS — K8050 Calculus of bile duct without cholangitis or cholecystitis without obstruction: Secondary | ICD-10-CM | POA: Diagnosis not present

## 2021-11-22 DIAGNOSIS — Z79899 Other long term (current) drug therapy: Secondary | ICD-10-CM | POA: Diagnosis not present

## 2021-11-22 DIAGNOSIS — I10 Essential (primary) hypertension: Secondary | ICD-10-CM | POA: Diagnosis not present

## 2021-11-22 DIAGNOSIS — F17210 Nicotine dependence, cigarettes, uncomplicated: Secondary | ICD-10-CM | POA: Insufficient documentation

## 2021-11-22 DIAGNOSIS — I2510 Atherosclerotic heart disease of native coronary artery without angina pectoris: Secondary | ICD-10-CM | POA: Insufficient documentation

## 2021-11-22 HISTORY — PX: ERCP: SHX5425

## 2021-11-22 SURGERY — ERCP, WITH INTERVENTION IF INDICATED
Anesthesia: General

## 2021-11-22 MED ORDER — INDOMETHACIN 50 MG RE SUPP
100.0000 mg | Freq: Once | RECTAL | Status: AC
  Administered 2021-11-22: 100 mg via RECTAL

## 2021-11-22 MED ORDER — LABETALOL HCL 5 MG/ML IV SOLN
5.0000 mg | Freq: Once | INTRAVENOUS | Status: AC
  Administered 2021-11-22: 5 mg via INTRAVENOUS
  Filled 2021-11-22: qty 4

## 2021-11-22 MED ORDER — LIDOCAINE HCL (CARDIAC) PF 100 MG/5ML IV SOSY
PREFILLED_SYRINGE | INTRAVENOUS | Status: DC | PRN
  Administered 2021-11-22: 100 mg via INTRAVENOUS

## 2021-11-22 MED ORDER — INDOMETHACIN 50 MG RE SUPP
RECTAL | Status: AC
  Filled 2021-11-22: qty 2

## 2021-11-22 MED ORDER — SODIUM CHLORIDE 0.9 % IV SOLN: INTRAVENOUS | Status: DC

## 2021-11-22 MED ORDER — PROPOFOL 500 MG/50ML IV EMUL
INTRAVENOUS | Status: DC | PRN
  Administered 2021-11-22: 150 ug/kg/min via INTRAVENOUS

## 2021-11-22 MED ORDER — KETAMINE HCL 50 MG/ML IJ SOLN
INTRAMUSCULAR | Status: DC | PRN
  Administered 2021-11-22: 25 mg via INTRAMUSCULAR

## 2021-11-22 MED ORDER — LACTATED RINGERS IV SOLN: INTRAVENOUS | Status: DC | PRN

## 2021-11-22 MED ORDER — PROPOFOL 10 MG/ML IV BOLUS
INTRAVENOUS | Status: DC | PRN
Start: 2021-11-22 — End: 2021-11-22
  Administered 2021-11-22: 50 mg via INTRAVENOUS

## 2021-11-22 NOTE — Anesthesia Postprocedure Evaluation (Signed)
Anesthesia Post Note ? ?Patient: Kathleen Blankenship ? ?Procedure(s) Performed: ENDOSCOPIC RETROGRADE CHOLANGIOPANCREATOGRAPHY (ERCP) ? ?Patient location during evaluation: PACU ?Anesthesia Type: General ?Level of consciousness: awake and awake and alert ?Pain management: pain level controlled ?Vital Signs Assessment: post-procedure vital signs reviewed and stable ?Respiratory status: spontaneous breathing and respiratory function stable ?Cardiovascular status: stable ?Anesthetic complications: no ? ? ?No notable events documented. ? ? ?Last Vitals:  ?Vitals:  ? 11/22/21 1233 11/22/21 1243  ?BP: (!) 160/96 (!) 171/99  ?Pulse: 78 83  ?Resp: 15 14  ?Temp:    ?SpO2: 97% 99%  ?  ?Last Pain:  ?Vitals:  ? 11/22/21 1243  ?TempSrc:   ?PainSc: 0-No pain  ? ? ?  ?  ?  ?  ?  ?  ? ?VAN STAVEREN,[place]reale ? ? ? ? ?

## 2021-11-22 NOTE — Op Note (Signed)
[facility] ?Gastroenterology ?Patient Name: Kathleen Blankenship ?Procedure Date: 11/22/2021 11:17 AM ?MRN: [id number] ?Account #: [id number] ?Date of Birth: [birth year] ?Admit Type: Outpatient ?Age: 71 ?Room: [facility] ENDO ROOM 4 ?Gender: Female ?Note Status: Finalized ?Instrument Name: [id number] ?Procedure:             ERCP ?Indications:           Common bile duct stone(s) ?Providers:             Lucilla Lame MD, MD ?Referring MD:          Tracie Harrier, MD (Referring MD) ?Medicines:             Propofol per Anesthesia ?Complications:         No immediate complications. ?Procedure:             Pre-Anesthesia Assessment: ?                       - Prior to the procedure, a History and Physical was  ?                       performed, and patient medications and allergies were  ?                       reviewed. The patient's tolerance of previous  ?                       anesthesia was also reviewed. The risks and benefits  ?                       of the procedure and the sedation options and risks  ?                       were discussed with the patient. All questions were  ?                       answered, and informed consent was obtained. Prior  ?                       Anticoagulants: The patient has taken no previous  ?                       anticoagulant or antiplatelet agents. ASA Grade  ?                       Assessment: II - A patient with mild systemic disease.  ?                       After reviewing the risks and benefits, the patient  ?                       was deemed in satisfactory condition to undergo the  ?                       procedure. ?                       After obtaining informed consent, the scope was passed  ?  under direct vision. Throughout the procedure, the  ?                       patient's blood pressure, pulse, and oxygen  ?                       saturations were monitored continuously. The  ?                       Duodenoscope was introduced  through the mouth, and  ?                       used to inject contrast into and used to inject  ?                       contrast into the bile duct. The ERCP was accomplished  ?                       without difficulty. The patient tolerated the  ?                       procedure well. ?Findings: ?     The scout film was normal. The major papilla was located entirely within  ?     a diverticulum. The bile duct was deeply cannulated with the short-nosed  ?     traction sphincterotome. Contrast was injected. I personally interpreted  ?     the bile duct images. There was brisk flow of contrast through the  ?     ducts. Image quality was excellent. Contrast extended to the entire  ?     biliary tree. The upper third of the main bile duct contained one stone.  ?     The main bile duct was mildly dilated. A wire was passed into the  ?     biliary tree. A 6 mm biliary sphincterotomy was made with a traction  ?     (standard) sphincterotome using ERBE electrocautery. There was no  ?     post-sphincterotomy bleeding. The biliary tree was swept with a 15 mm  ?     balloon starting at the bifurcation. One stone was removed. No stones  ?     remained. ?Impression:            - The major papilla was located entirely within a  ?                       diverticulum. ?                       - The entire main bile duct was mildly dilated. ?                       - Choledocholithiasis was found. Complete removal was  ?                       accomplished by biliary sphincterotomy and balloon  ?                       extraction. ?                       -  A biliary sphincterotomy was performed. ?                       - The biliary tree was swept. ?Recommendation:        - Discharge patient to home. ?                       - Clear liquid diet today. ?                       - Watch for pancreatitis, bleeding, perforation, and  ?                       cholangitis. ?                       - If any nausea, vomiting or abdominal pain then  go to  ?                       the ED. ?Procedure Code(s):     --- Professional --- ?                       [phone], Endoscopic retrograde cholangiopancreatography  ?                       (ERCP); with removal of calculi/debris from  ?                       biliary/pancreatic duct(s) ?                       27741, Endoscopic retrograde cholangiopancreatography  ?                       (ERCP); with sphincterotomy/papillotomy ?                       74328, Endoscopic catheterization of the biliary  ?                       ductal system, radiological supervision and  ?                       interpretation ?Diagnosis Code(s):     --- Professional --- ?                       K80.50, Calculus of bile duct without cholangitis or  ?                       cholecystitis without obstruction ?CPT copyright 2019 [employer] Association. All rights reserved. ?The codes documented in this report are preliminary and upon coder review may  ?be revised to meet current compliance requirements. ?Lucilla Lame MD, MD ?11/22/2021 11:53:10 AM ?This report has been signed electronically. ?Number of Addenda: 0 ?Note Initiated On: 11/22/2021 11:17 AM ?Estimated Blood Loss:  Estimated blood loss: none. ?     [facility] ?

## 2021-11-22 NOTE — Transfer of Care (Signed)
Immediate Anesthesia Transfer of Care Note ? ?Patient: Kathleen Blankenship ? ?Procedure(s) Performed: ENDOSCOPIC RETROGRADE CHOLANGIOPANCREATOGRAPHY (ERCP) ? ?Patient Location: Endoscopy Unit ? ?Anesthesia Type:General ? ?Level of Consciousness: drowsy ? ?Airway & Oxygen Therapy: Patient Spontanous Breathing ? ?Post-op Assessment: Report given to RN and Post -op Vital signs reviewed and stable ? ?Post vital signs: Reviewed and stable ? ?Last Vitals:  ?Vitals Value Taken Time  ?BP    ?Temp 36 ?C 11/22/21 1153  ?Pulse 85 11/22/21 1153  ?Resp 20 11/22/21 1153  ?SpO2 97 % 11/22/21 1153  ? ? ?Last Pain:  ?Vitals:  ? 11/22/21 1153  ?TempSrc: Temporal  ?PainSc:   ?   ? ?  ? ?Complications: No notable events documented. ?

## 2021-11-22 NOTE — Brief Op Note (Signed)
Bp 176/106 notified Dr. Boston Service, new orders rec'd to give Labetolol '5mg'$  IV x1. Will continue to monitor. Also notified sister for patient to take her BP medication once she gets home. ?

## 2021-11-22 NOTE — Anesthesia Preprocedure Evaluation (Signed)
Anesthesia Evaluation  ?Patient identified by MRN, date of birth, ID band ?Patient awake ? ? ? ?Reviewed: ?Allergy & Precautions, NPO status , Patient's Chart, lab work & pertinent test results ? ?Airway ?Mallampati: II ? ?TM Distance: >3 FB ?Neck ROM: Full ? ? ? Dental ? ?(+) Edentulous Upper, Edentulous Lower ?  ?Pulmonary ?neg pulmonary ROS, Current Smoker and Patient abstained from smoking.,  ?  ?Pulmonary exam normal ? ?+ decreased breath sounds ? ? ? ? ? Cardiovascular ?Exercise Tolerance: Good ?hypertension, Pt. on medications ?+ CAD  ?negative cardio ROS ?Normal cardiovascular exam ?Rhythm:Regular Rate:Normal ? ? ?  ?Neuro/Psych ?negative neurological ROS ? negative psych ROS  ? GI/Hepatic ?negative GI ROS, Neg liver ROS,   ?Endo/Other  ?negative endocrine ROS ? Renal/GU ?negative Renal ROS  ?negative genitourinary ?  ?Musculoskeletal ? ?(+) Arthritis ,  ? Abdominal ?Normal abdominal exam  (+)   ?Peds ?negative pediatric ROS ?(+)  Hematology ?negative hematology ROS ?(+)   ?Anesthesia Other Findings ?Past Medical History: ?No date: Arthritis ?    Comment:  RA ?No date: Hypertension ? ?Past Surgical History: ?05/28/2017: CORONARY/GRAFT ACUTE MI REVASCULARIZATION; N/A ?    Comment:  Procedure: Coronary/Graft Acute MI Revascularization;   ?             Surgeon: Isaias Cowman, MD;  Location: [place]  ?             INVASIVE CV LAB;  Service: Cardiovascular;  Laterality:  ?             N/A; ?05/28/2017: LEFT HEART CATH AND CORONARY ANGIOGRAPHY; N/A ?    Comment:  Procedure: LEFT HEART CATH AND CORONARY ANGIOGRAPHY;   ?             Surgeon: Isaias Cowman, MD;  Location: [place]  ?             INVASIVE CV LAB;  Service: Cardiovascular;  Laterality:  ?             N/A; ?07/26/2020: ORIF ELBOW FRACTURE; Right ?    Comment:  Procedure: OPEN REDUCTION INTERNAL FIXATION (ORIF)  ?             ELBOW/OLECRANON FRACTURE;  Surgeon: Shona Needles, MD;  ?             Location: [place];   Service: Orthopedics;  Laterality:  ?             Right; ?07/29/2020: REVERSE SHOULDER ARTHROPLASTY; Left ?    Comment:  Procedure: REVERSE SHOULDER ARTHROPLASTY;  Surgeon:  ?             Hiram Gash, MD;  Location: [place];  Service:  ?             Orthopedics;  Laterality: Left; ?No date: THYROID LOBECTOMY ? ?BMI   ? Body Mass Index: 22.11 kg/m?  ?  ? ? Reproductive/Obstetrics ?negative OB ROS ? ?  ? ? ? ? ? ? ? ? ? ? ? ? ? ?  ?  ? ? ? ? ? ? ? ? ?Anesthesia Physical ?Anesthesia Plan ? ?ASA: 3 ? ?Anesthesia Plan: General  ? ?Post-op Pain Management:   ? ?Induction: Intravenous ? ?PONV Risk Score and Plan: Propofol infusion and TIVA ? ?Airway Management Planned: Natural Airway and Nasal Cannula ? ?Additional Equipment:  ? ?Intra-op Plan:  ? ?Post-operative Plan:  ? ?Informed Consent: I have reviewed the patients History and Physical, chart, labs and  discussed the procedure including the risks, benefits and alternatives for the proposed anesthesia with the patient or authorized representative who has indicated his/her understanding and acceptance.  ? ? ? ?Dental Advisory Given ? ?Plan Discussed with: CRNA and Surgeon ? ?Anesthesia Plan Comments:   ? ? ? ? ? ? ?Anesthesia Quick Evaluation ? ?

## 2021-11-22 NOTE — H&P (Signed)
? ?Kathleen Lame, MD [facility] ?[address] ?[place], [place] [address] ?Phone:[phone] ?Fax : [phone] ? ?Primary Care Physician:  Tracie Harrier, MD ?Primary Gastroenterologist:  Dr. Allen Norris ? ?Pre-Procedure History & Physical: ?HPI:  Kathleen Blankenship is a 71 y.o. female is here for an ERCP. ?  ?Past Medical History:  ?Diagnosis Date  ? Arthritis   ? RA  ? Hypertension   ? ? ?Past Surgical History:  ?Procedure Laterality Date  ? CORONARY/GRAFT ACUTE MI REVASCULARIZATION N/A 05/28/2017  ? Procedure: Coronary/Graft Acute MI Revascularization;  Surgeon: Isaias Cowman, MD;  Location: [place] CV LAB;  Service: Cardiovascular;  Laterality: N/A;  ? LEFT HEART CATH AND CORONARY ANGIOGRAPHY N/A 05/28/2017  ? Procedure: LEFT HEART CATH AND CORONARY ANGIOGRAPHY;  Surgeon: Isaias Cowman, MD;  Location: [place] CV LAB;  Service: Cardiovascular;  Laterality: N/A;  ? ORIF ELBOW FRACTURE Right 07/26/2020  ? Procedure: OPEN REDUCTION INTERNAL FIXATION (ORIF) ELBOW/OLECRANON FRACTURE;  Surgeon: Shona Needles, MD;  Location: [place];  Service: Orthopedics;  Laterality: Right;  ? REVERSE SHOULDER ARTHROPLASTY Left 07/29/2020  ? Procedure: REVERSE SHOULDER ARTHROPLASTY;  Surgeon: Hiram Gash, MD;  Location: [place];  Service: Orthopedics;  Laterality: Left;  ? THYROID LOBECTOMY    ? ? ?Prior to Admission medications   ?Medication Sig Start Date End Date Taking? Authorizing Provider  ?atorvastatin (LIPITOR) 10 MG tablet Take 10 mg by mouth daily. 06/26/20  Yes [provider]  ?losartan (COZAAR) 50 MG tablet Take 50 mg by mouth daily.   Yes [provider]  ?metoprolol succinate (TOPROL-XL) 50 MG 24 hr tablet Take 50 mg by mouth daily. 05/08/20  Yes [provider]  ?albuterol (PROVENTIL HFA;VENTOLIN HFA) 108 (90 Base) MCG/ACT inhaler Inhale 2 puffs into the lungs every 6 (six) hours as needed for wheezing or shortness of breath. 05/29/17   Kathleen Lesches, MD  ?benzonatate  (TESSALON) 200 MG capsule Take 200 mg by mouth daily as needed for cough. 07/04/20   [provider]  ?colchicine 0.6 MG tablet Take 1 tablet (0.6 mg total) by mouth daily. 05/30/17   Kathleen Lesches, MD  ?gabapentin (NEURONTIN) 100 MG capsule Take 1 capsule (100 mg total) by mouth 2 (two) times daily for 14 days. For pain. 07/30/20 08/13/20  Ethelda Chick, PA-C  ?hydroxychloroquine (PLAQUENIL) 200 MG tablet Take 200 mg by mouth daily.  06/26/20   [provider]  ?inFLIXimab (REMICADE) 100 MG injection Inject 100 mg into the vein.    [provider]  ?leflunomide (ARAVA) 20 MG tablet Take 20 mg by mouth daily.    [provider]  ?methocarbamol (ROBAXIN) 500 MG tablet Take 1 tablet (500 mg total) by mouth every 8 (eight) hours as needed for muscle spasms. 07/30/20   Ethelda Chick, PA-C  ?omeprazole (PRILOSEC) 20 MG capsule Take 1 capsule (20 mg total) by mouth daily. 30 days for gastroprotection while taking NSAIDs. 07/30/20   Ethelda Chick, PA-C  ?sertraline (ZOLOFT) 50 MG tablet Take 50 mg by mouth at bedtime.    [provider]  ?Upadacitinib ER (RINVOQ) 15 MG TB24 Take 15 mg by mouth daily.    [provider]  ?vitamin B-12 (CYANOCOBALAMIN) 1000 MCG tablet Take 1,000 mcg by mouth daily.    [provider]  ? ? ?Allergies as of 11/19/2021 - Review Complete 07/29/2020  ?Allergen Reaction Noted  ? Codeine Hives 05/28/2017  ? Remicade [infliximab] Other (See Comments) 07/26/2020  ? ? ?Family  History  ?Family history unknown: Yes  ? ? ?Social History  ? ?Socioeconomic History  ? Marital status: Divorced  ?  Spouse name: Not on file  ? Number of children: Not on file  ? Years of education: Not on file  ? Highest education level: Not on file  ?Occupational History  ? Not on file  ?Tobacco Use  ? Smoking status: Every Day  ?  Packs/day: 1.00  ?  Years: 37.00  ?  Pack years: 37.00  ?  Types: Cigarettes  ? Smokeless tobacco: Never  ?Vaping Use   ? Vaping Use: Not on file  ?Substance and Sexual Activity  ? Alcohol use: No  ? Drug use: No  ? Sexual activity: Never  ?Other Topics Concern  ? Not on file  ?Social History Narrative  ? Not on file  ? ?Social Determinants of Health  ? ?Financial Resource Strain: Not on file  ?Food Insecurity: Not on file  ?Transportation Needs: Not on file  ?Physical Activity: Not on file  ?Stress: Not on file  ?Social Connections: Not on file  ?Intimate Partner Violence: Not on file  ? ? ?Review of Systems: ?See HPI, otherwise negative ROS ? ?Physical Exam: ?BP (!) 141/95   Pulse (!) 110   Temp 97.6 ?F (36.4 ?C) (Temporal)   Resp 16   Ht '5\' 1"'$  (1.549 m)   Wt 53.1 kg   SpO2 96%   BMI 22.11 kg/m?  ?General:   Alert,  pleasant and cooperative in NAD ?Head:  Normocephalic and atraumatic. ?Neck:  Supple; no masses or thyromegaly. ?Lungs:  Clear throughout to auscultation.    ?Heart:  Regular rate and rhythm. ?Abdomen:  Soft, nontender and nondistended. Normal bowel sounds, without guarding, and without rebound.   ?Neurologic:  Alert and  oriented x4;  grossly normal neurologically. ? ?Impression/Plan: ?Kathleen Blankenship is here for an ERCP to be performed for CBD stone ? ?Risks, benefits, limitations, and alternatives regarding  ERCP have been reviewed with the patient.  Questions have been answered.  All parties agreeable. ? ? ?Kathleen Lame, MD  11/22/2021, 11:14 AM ?

## 2021-11-23 ENCOUNTER — Encounter: Payer: Self-pay | Admitting: Gastroenterology

## 2021-11-28 ENCOUNTER — Encounter: Payer: Self-pay | Admitting: Gastroenterology

## 2021-12-03 ENCOUNTER — Other Ambulatory Visit
Admission: RE | Admit: 2021-12-03 | Discharge: 2021-12-03 | Disposition: A | Discharge status: Home / Self Care | Payer: Medicare HMO | Source: Ambulatory Visit | Attending: Surgery | Admitting: Surgery

## 2021-12-03 ENCOUNTER — Ambulatory Visit: Payer: Self-pay | Admitting: Surgery

## 2021-12-03 ENCOUNTER — Other Ambulatory Visit: Payer: Self-pay

## 2021-12-03 DIAGNOSIS — Z01812 Encounter for preprocedural laboratory examination: Secondary | ICD-10-CM

## 2021-12-03 HISTORY — DX: Anxiety disorder, unspecified: F41.9

## 2021-12-03 HISTORY — DX: Atherosclerotic heart disease of native coronary artery without angina pectoris: I25.10

## 2021-12-03 HISTORY — DX: Pneumonia, unspecified organism: J18.9

## 2021-12-03 HISTORY — DX: Angina pectoris, unspecified: I20.9

## 2021-12-03 HISTORY — DX: Gastro-esophageal reflux disease without esophagitis: K21.9

## 2021-12-03 NOTE — H&P (Signed)
Subjective:  ? ?CC: Choledocholithiasis [K80.50] ? ?HPI: ?Kathleen Blankenship is a 71 y.o. female who was referred by Allene Dillon, NP for evaluation of above CC. Symptoms were first noted a few months ago. Pain is sharp, discomfort and burning, confined to the epigastric area, without radiation. Associated with nausea, exacerbated by nothing  ? ? ?Past Medical History: has a past medical history of Depression, Hypertension, Hypothyroidism, Osteoarthritis, Rheumatoid arthritis (CMS-HCC), and Substance abuse (CMS-HCC). ? ?Past Surgical History: has a past surgical history that includes Thyroid surgery (08/26/1994) and arm surgery. ? ?Family History: family history includes ALS in her mother; Arthritis in her sister; Heart disease in her brother and father; Lung cancer in her brother and father; Psoriasis in her sister. ? ?Social History: reports that she has been smoking cigarettes. She has been smoking an average of 1 pack per day. She has never used smokeless tobacco. She reports that she does not drink alcohol. No history on file for drug use. ? ?Current Medications: has a current medication list which includes the following prescription(s): acetaminophen, albuterol, alendronate, atorvastatin, leflunomide, losartan, metoprolol succinate, pantoprazole, rinvoq, sertraline, tramadol, and diazepam. ? ?Allergies:  ?Allergies as of 10/23/2021 - Reviewed 10/23/2021  ?Allergen Reaction Noted  ? Codeine sulfate Itching 02/02/2014  ? Infliximab Other (See Comments) 07/26/2020  ? ?ROS:  ?A 15 point review of systems was performed and pertinent positives and negatives noted in HPI  ? ?Objective:  ? ? ?BP (!) 140/95  Pulse 102  Ht 160 cm ('5\' 3"'$ )  Wt 58.5 kg (129 lb)  BMI 22.85 kg/m?  ? ?Constitutional : No distress, cooperative, alert  ?Lymphatics/Throat: Supple with no lymphadenopathy  ?Respiratory: Clear to auscultation bilaterally  ?Cardiovascular: Regular rate and rhythm  ?Gastrointestinal: Soft, non-tender, non-distended,  no organomegaly.  ?Musculoskeletal: Steady gait and movement  ?Skin: Cool and moist, no surgical scars  ?Psychiatric: Normal affect, non-agitated, not confused  ? ? ?LABS:  ?n/a  ? ?RADS: ?CLINICAL DATA:  Lower back pain radiating to the left lower  ?extremity  ? ?EXAM:  ?MRI LUMBAR SPINE WITHOUT CONTRAST  ? ?TECHNIQUE:  ?Multiplanar, multisequence MR imaging of the lumbar spine was  ?performed. No intravenous contrast was administered.  ? ?COMPARISON:  None.  ? ?FINDINGS:  ?Segmentation:  5 lumbar type vertebrae  ? ?Alignment: Degenerative anterolisthesis at L5-S1 and retrolisthesis  ?at L1-2. mild scoliosis.  ? ?Vertebrae: No recent fracture, evidence of discitis, or bone lesion.  ?Remote L5 compression fracture with moderate depression.  ? ?Conus medullaris and cauda equina: Conus extends to the T12-L1  ?level. Conus and cauda equina appear normal.  ? ?Paraspinal and other soft tissues: Sigmoid diverticulosis.  ?Hypointense rounded filling defect at the distal CBD measuring 4 mm  ? ?Disc levels:  ? ?T12- L1: Unremarkable.  ? ?L1-L2: Disc narrowing and bulging with endplate and facet spurring  ?eccentric to the right. Moderate right foraminal impingement. Right  ?subarticular recess narrowing without L2 compression.  ? ?L2-L3: Disc narrowing and bulging with endplate and facet spurring  ?eccentric to the right. Moderate right foraminal stenosis. Mild  ?spinal stenosis  ? ?L3-L4: Disc narrowing and bulging eccentric to the right. Asymmetric  ?right facet spurring. Ligamentum flavum thickening. High-grade  ?spinal stenosis. Asymmetric right L4 impingement the subarticular  ?recess. Mild to moderate right foraminal narrowing  ? ?L4-L5: Disc narrowing and bulging with facet spurring asymmetric to  ?the left. Moderate left foraminal impingement.  ? ?L5-S1:Facet spurring and anterolisthesis. Small anterior synovial  ?cyst on the left. Disc  space narrowing and bulging with mild left  ?foraminal stenosis.  ? ?IMPRESSION:   ?1. 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  ?2. L3-4 high-grade spinal stenosis. Right more than left  ?subarticular recess impingement on L4.  ?3. Moderate foraminal narrowing on the right at L1-2 to L3-4 and on  ?the left at L4-5.  ?4. Remote L5 compression fracture, healed.  ?5. 4 mm distal CBD gallstone, recommend GI follow-up.  ? ?Electronically Signed  ?  By: Jorje Guild M.D.  ?  On: 10/09/2021 08:57 ? ?Assessment:  ? ? ?Choledocholithiasis [K80.50] ? ?Plan:  ? ? ?1. Choledocholithiasis [K80.50] ?Choledocolithiasis- common bile duct stone noted on previous MRI. Will obtain MRI specific to gallbladder and biliary tree anatomy to see if stone still present. Bloodwork in the meantime as well to monitor bile level. ? ?MRCP confirmed stone, she is now s/p ERCP, ready to proceed with lap chole to prevent future issues. ? ?Discussed the risk of surgery including post-op infxn, seroma, biloma, chronic pain, poor-delayed wound healing, retained gallstone, conversion to open procedure, post-op SBO or ileus, and need for additional procedures to address said risks.  The risks of general anesthetic including MI, CVA, sudden death or even reaction to anesthetic medications also discussed. Alternatives include continued observation.  Benefits include possible symptom relief, prevention of complications including acute cholecystitis, pancreatitis. ? ?Typical post operative recovery of 3-5 days rest, continued pain in area and incision sites, possible loose stools up to 4-6 weeks, also discussed. ? ?ED return precautions given for sudden increase in RUQ pain, with possible accompanying fever, nausea, and/or vomiting. ? ?The patient understands the risks, any and all questions were answered to the patient's satisfaction. ? ?labs/images/medications/previous chart entries reviewed personally and relevant changes/updates noted above. ?

## 2021-12-03 NOTE — H&P (View-Only) (Signed)
Subjective:  ? ?CC: Choledocholithiasis [K80.50] ? ?HPI: ?Kathleen Blankenship is a 71 y.o. female who was referred by Allene Dillon, NP for evaluation of above CC. Symptoms were first noted a few months ago. Pain is sharp, discomfort and burning, confined to the epigastric area, without radiation. Associated with nausea, exacerbated by nothing  ? ? ?Past Medical History: has a past medical history of Depression, Hypertension, Hypothyroidism, Osteoarthritis, Rheumatoid arthritis (CMS-HCC), and Substance abuse (CMS-HCC). ? ?Past Surgical History: has a past surgical history that includes Thyroid surgery (08/26/1994) and arm surgery. ? ?Family History: family history includes ALS in her mother; Arthritis in her sister; Heart disease in her brother and father; Lung cancer in her brother and father; Psoriasis in her sister. ? ?Social History: reports that she has been smoking cigarettes. She has been smoking an average of 1 pack per day. She has never used smokeless tobacco. She reports that she does not drink alcohol. No history on file for drug use. ? ?Current Medications: has a current medication list which includes the following prescription(s): acetaminophen, albuterol, alendronate, atorvastatin, leflunomide, losartan, metoprolol succinate, pantoprazole, rinvoq, sertraline, tramadol, and diazepam. ? ?Allergies:  ?Allergies as of 10/23/2021 - Reviewed 10/23/2021  ?Allergen Reaction Noted  ? Codeine sulfate Itching 02/02/2014  ? Infliximab Other (See Comments) 07/26/2020  ? ?ROS:  ?A 15 point review of systems was performed and pertinent positives and negatives noted in HPI  ? ?Objective:  ? ? ?BP (!) 140/95  Pulse 102  Ht 160 cm ('5\' 3"'$ )  Wt 58.5 kg (129 lb)  BMI 22.85 kg/m?  ? ?Constitutional : No distress, cooperative, alert  ?Lymphatics/Throat: Supple with no lymphadenopathy  ?Respiratory: Clear to auscultation bilaterally  ?Cardiovascular: Regular rate and rhythm  ?Gastrointestinal: Soft, non-tender, non-distended,  no organomegaly.  ?Musculoskeletal: Steady gait and movement  ?Skin: Cool and moist, no surgical scars  ?Psychiatric: Normal affect, non-agitated, not confused  ? ? ?LABS:  ?n/a  ? ?RADS: ?CLINICAL DATA:  Lower back pain radiating to the left lower  ?extremity  ? ?EXAM:  ?MRI LUMBAR SPINE WITHOUT CONTRAST  ? ?TECHNIQUE:  ?Multiplanar, multisequence MR imaging of the lumbar spine was  ?performed. No intravenous contrast was administered.  ? ?COMPARISON:  None.  ? ?FINDINGS:  ?Segmentation:  5 lumbar type vertebrae  ? ?Alignment: Degenerative anterolisthesis at L5-S1 and retrolisthesis  ?at L1-2. mild scoliosis.  ? ?Vertebrae: No recent fracture, evidence of discitis, or bone lesion.  ?Remote L5 compression fracture with moderate depression.  ? ?Conus medullaris and cauda equina: Conus extends to the T12-L1  ?level. Conus and cauda equina appear normal.  ? ?Paraspinal and other soft tissues: Sigmoid diverticulosis.  ?Hypointense rounded filling defect at the distal CBD measuring 4 mm  ? ?Disc levels:  ? ?T12- L1: Unremarkable.  ? ?L1-L2: Disc narrowing and bulging with endplate and facet spurring  ?eccentric to the right. Moderate right foraminal impingement. Right  ?subarticular recess narrowing without L2 compression.  ? ?L2-L3: Disc narrowing and bulging with endplate and facet spurring  ?eccentric to the right. Moderate right foraminal stenosis. Mild  ?spinal stenosis  ? ?L3-L4: Disc narrowing and bulging eccentric to the right. Asymmetric  ?right facet spurring. Ligamentum flavum thickening. High-grade  ?spinal stenosis. Asymmetric right L4 impingement the subarticular  ?recess. Mild to moderate right foraminal narrowing  ? ?L4-L5: Disc narrowing and bulging with facet spurring asymmetric to  ?the left. Moderate left foraminal impingement.  ? ?L5-S1:Facet spurring and anterolisthesis. Small anterior synovial  ?cyst on the left. Disc  space narrowing and bulging with mild left  ?foraminal stenosis.  ? ?IMPRESSION:   ?1. 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  ?2. L3-4 high-grade spinal stenosis. Right more than left  ?subarticular recess impingement on L4.  ?3. Moderate foraminal narrowing on the right at L1-2 to L3-4 and on  ?the left at L4-5.  ?4. Remote L5 compression fracture, healed.  ?5. 4 mm distal CBD gallstone, recommend GI follow-up.  ? ?Electronically Signed  ?  By: Jorje Guild M.D.  ?  On: 10/09/2021 08:57 ? ?Assessment:  ? ? ?Choledocholithiasis [K80.50] ? ?Plan:  ? ? ?1. Choledocholithiasis [K80.50] ?Choledocolithiasis- common bile duct stone noted on previous MRI. Will obtain MRI specific to gallbladder and biliary tree anatomy to see if stone still present. Bloodwork in the meantime as well to monitor bile level. ? ?MRCP confirmed stone, she is now s/p ERCP, ready to proceed with lap chole to prevent future issues. ? ?Discussed the risk of surgery including post-op infxn, seroma, biloma, chronic pain, poor-delayed wound healing, retained gallstone, conversion to open procedure, post-op SBO or ileus, and need for additional procedures to address said risks.  The risks of general anesthetic including MI, CVA, sudden death or even reaction to anesthetic medications also discussed. Alternatives include continued observation.  Benefits include possible symptom relief, prevention of complications including acute cholecystitis, pancreatitis. ? ?Typical post operative recovery of 3-5 days rest, continued pain in area and incision sites, possible loose stools up to 4-6 weeks, also discussed. ? ?ED return precautions given for sudden increase in RUQ pain, with possible accompanying fever, nausea, and/or vomiting. ? ?The patient understands the risks, any and all questions were answered to the patient's satisfaction. ? ?labs/images/medications/previous chart entries reviewed personally and relevant changes/updates noted above. ?

## 2021-12-03 NOTE — Patient Instructions (Addendum)
Your procedure is scheduled on:12/06/21 - Thursday ?Report to the Registration Desk on the 1st floor of the [facility]. ?To find out your arrival time, please call [phone] between 1PM - 3PM on: 12/05/21 - Wednesday ? ?REMEMBER: ?Instructions that are not followed completely may result in serious medical risk, up to and including death; or upon the discretion of your surgeon and anesthesiologist your surgery may need to be rescheduled. ? ?Do not eat food after midnight the night before surgery.  ?No gum chewing, lozengers or hard candies. ? ?You may however, drink CLEAR liquids up to 2 hours before you are scheduled to arrive for your surgery. Do not drink anything within 2 hours of your scheduled arrival time. ? ?Clear liquids include: ?- water  ?- apple juice without pulp ?- gatorade (not RED colors) ?- black coffee or tea (Do NOT add milk or creamers to the coffee or tea) ?Do NOT drink anything that is not on this list. ? ?TAKE THESE MEDICATIONS THE MORNING OF SURGERY WITH A SIP OF WATER: ? ?- leflunomide (ARAVA) 20 MG tablet ?- metoprolol succinate (TOPROL-XL) 50 MG 24 hr tablet ?- pantoprazole (PROTONIX) 40 MG tablet, (take one the night before and one on the morning of surgery - helps to prevent nausea after surgery.) ?- Upadacitinib ER (RINVOQ) 15 MG TB24 ? ?Use albuterol (PROVENTIL HFA;VENTOLIN HFA) 108 (90 Base) MCG/ACT inhaler on the day of surgery and bring to the hospital. ? ?One week prior to surgery: ?Stop Anti-inflammatories (NSAIDS) such as Advil, Aleve, Ibuprofen, Motrin, Naproxen, Naprosyn and Aspirin based products such as Excedrin, Goodys Powder, BC Powder. ? ?Stop ANY OVER THE COUNTER supplements until after surgery. ? ?You may however, continue to take Tylenol if needed for pain up until the day of surgery. ? ?No Alcohol for 24 hours before or after surgery. ? ?No Smoking including e-cigarettes for 24 hours prior to surgery.  ?No chewable tobacco products for at least 6 hours prior to  surgery.  ?No nicotine patches on the day of surgery. ? ?Do not use any "recreational" drugs for at least a week prior to your surgery.  ?Please be advised that the combination of cocaine and anesthesia may have negative outcomes, up to and including death. ?If you test positive for cocaine, your surgery will be cancelled. ? ?On the morning of surgery brush your teeth with toothpaste and water, you may rinse your mouth with mouthwash if you wish. ?Do not swallow any toothpaste or mouthwash. ? ?Use CHG Soap or wipes as directed on instruction sheet. ? ?Do not wear jewelry, make-up, hairpins, clips or nail polish. ? ?Do not wear lotions, powders, or perfumes.  ? ?Do not shave body from the neck down 48 hours prior to surgery just in case you cut yourself which could leave a site for infection.  ?Also, freshly shaved skin may become irritated if using the CHG soap. ? ?Contact lenses, hearing aids and dentures may not be worn into surgery. ? ?Do not bring valuables to the hospital. [facility] is not responsible for any missing/lost belongings or valuables.  ? ?Notify your doctor if there is any change in your medical condition (cold, fever, infection). ? ?Wear comfortable clothing (specific to your surgery type) to the hospital. ? ?After surgery, you can help prevent lung complications by doing breathing exercises.  ?Take deep breaths and cough every 1-2 hours. Your doctor may order a device called an Incentive Spirometer to help you take deep breaths. ?When coughing or sneezing,  hold a pillow firmly against your incision with both hands. This is called ?splinting.? Doing this helps protect your incision. It also decreases belly discomfort. ? ?If you are being admitted to the hospital overnight, leave your suitcase in the car. ?After surgery it may be brought to your room. ? ?If you are being discharged the day of surgery, you will not be allowed to drive home. ?You will need a responsible adult (18 years or older) to  drive you home and stay with you that night.  ? ?If you are taking public transportation, you will need to have a responsible adult (18 years or older) with you. ?Please confirm with your physician that it is acceptable to use public transportation.  ? ?Please call the [facility] at [phone] if you have any questions about these instructions. ? ?Surgery Visitation Policy: ? ?Patients undergoing a surgery or procedure may have two family members or support persons with them as long as the person is not COVID-19 positive or experiencing its symptoms.  ? ?Inpatient Visitation:   ? ?Visiting hours are 7 a.m. to 8 p.m. ?Up to four visitors are allowed at one time in a patient room, including children. The visitors may rotate out with other people during the day. One designated support person (adult) may remain overnight.  ?

## 2021-12-04 ENCOUNTER — Other Ambulatory Visit
Admission: RE | Admit: 2021-12-04 | Discharge: 2021-12-04 | Disposition: A | Discharge status: Home / Self Care | Payer: Medicare HMO | Source: Ambulatory Visit | Attending: Surgery | Admitting: Surgery

## 2021-12-04 DIAGNOSIS — Z01818 Encounter for other preprocedural examination: Secondary | ICD-10-CM | POA: Insufficient documentation

## 2021-12-04 DIAGNOSIS — Z01812 Encounter for preprocedural laboratory examination: Secondary | ICD-10-CM

## 2021-12-04 LAB — BASIC METABOLIC PANEL
Anion gap: 10 (ref 5–15)
BUN: 8 mg/dL (ref 8–23)
CO2: 25 mmol/L (ref 22–32)
Calcium: 8.9 mg/dL (ref 8.9–10.3)
Chloride: 102 mmol/L (ref 98–111)
Creatinine, Ser: 0.57 mg/dL (ref 0.44–1.00)
GFR, Estimated: >60 mL/min (ref >60)
Glucose, Bld: 101 mg/dL — ABNORMAL HIGH (ref 70–99)
Potassium: 3.5 mmol/L (ref 3.5–5.1)
Sodium: 137 mmol/L (ref 135–145)

## 2021-12-05 MED ORDER — CHLORHEXIDINE GLUCONATE CLOTH 2 % EX PADS: 6.0000 | MEDICATED_PAD | Freq: Once | CUTANEOUS | Status: DC

## 2021-12-05 MED ORDER — CHLORHEXIDINE GLUCONATE 0.12 % MT SOLN: 15.0000 mL | Freq: Once | OROMUCOSAL | Status: AC

## 2021-12-05 MED ORDER — INDOCYANINE GREEN 25 MG IV SOLR
1.2500 mg | Freq: Once | INTRAVENOUS | Status: AC
  Administered 2021-12-06: 1.25 mg via INTRAVENOUS
  Filled 2021-12-05: qty 0.5

## 2021-12-05 MED ORDER — ORAL CARE MOUTH RINSE: 15.0000 mL | Freq: Once | OROMUCOSAL | Status: AC

## 2021-12-05 MED ORDER — LACTATED RINGERS IV SOLN: INTRAVENOUS | Status: DC

## 2021-12-05 MED ORDER — CEFAZOLIN SODIUM-DEXTROSE 2-4 GM/100ML-% IV SOLN
2.0000 g | INTRAVENOUS | Status: AC
  Administered 2021-12-06: 2 g via INTRAVENOUS

## 2021-12-06 ENCOUNTER — Other Ambulatory Visit: Payer: Self-pay

## 2021-12-06 ENCOUNTER — Encounter
Admission: RE | Disposition: A | Discharge status: Home / Self Care | Payer: Self-pay | Source: Home / Self Care | Attending: Surgery

## 2021-12-06 ENCOUNTER — Inpatient Hospital Stay: Payer: Medicare HMO

## 2021-12-06 ENCOUNTER — Inpatient Hospital Stay
Admission: RE | Admit: 2021-12-06 | Discharge: 2021-12-07 | DRG: 987 | Disposition: A | Discharge status: Home / Self Care | Payer: Medicare HMO | Attending: Surgery | Admitting: Surgery

## 2021-12-06 ENCOUNTER — Inpatient Hospital Stay (HOSPITAL_COMMUNITY)
Admission: RE | Admit: 2021-12-06 | Discharge: 2021-12-06 | Disposition: A | Discharge status: Home / Self Care | Payer: Medicare HMO | Source: Home / Self Care | Attending: Pulmonary Disease | Admitting: Pulmonary Disease

## 2021-12-06 ENCOUNTER — Ambulatory Visit: Payer: Medicare HMO | Admitting: Anesthesiology

## 2021-12-06 ENCOUNTER — Encounter: Payer: Self-pay | Admitting: Surgery

## 2021-12-06 DIAGNOSIS — F419 Anxiety disorder, unspecified: Secondary | ICD-10-CM | POA: Diagnosis present

## 2021-12-06 DIAGNOSIS — M069 Rheumatoid arthritis, unspecified: Secondary | ICD-10-CM | POA: Diagnosis present

## 2021-12-06 DIAGNOSIS — F17210 Nicotine dependence, cigarettes, uncomplicated: Secondary | ICD-10-CM | POA: Diagnosis present

## 2021-12-06 DIAGNOSIS — J9602 Acute respiratory failure with hypercapnia: Secondary | ICD-10-CM | POA: Diagnosis present

## 2021-12-06 DIAGNOSIS — M7138 Other bursal cyst, other site: Secondary | ICD-10-CM | POA: Diagnosis present

## 2021-12-06 DIAGNOSIS — M48061 Spinal stenosis, lumbar region without neurogenic claudication: Secondary | ICD-10-CM | POA: Diagnosis present

## 2021-12-06 DIAGNOSIS — I959 Hypotension, unspecified: Secondary | ICD-10-CM | POA: Diagnosis not present

## 2021-12-06 DIAGNOSIS — I25119 Atherosclerotic heart disease of native coronary artery with unspecified angina pectoris: Secondary | ICD-10-CM | POA: Diagnosis present

## 2021-12-06 DIAGNOSIS — I10 Essential (primary) hypertension: Secondary | ICD-10-CM | POA: Diagnosis present

## 2021-12-06 DIAGNOSIS — I469 Cardiac arrest, cause unspecified: Secondary | ICD-10-CM | POA: Diagnosis present

## 2021-12-06 DIAGNOSIS — Z885 Allergy status to narcotic agent status: Secondary | ICD-10-CM | POA: Diagnosis not present

## 2021-12-06 DIAGNOSIS — K5730 Diverticulosis of large intestine without perforation or abscess without bleeding: Secondary | ICD-10-CM | POA: Diagnosis present

## 2021-12-06 DIAGNOSIS — Z888 Allergy status to other drugs, medicaments and biological substances status: Secondary | ICD-10-CM

## 2021-12-06 DIAGNOSIS — M4856XA Collapsed vertebra, not elsewhere classified, lumbar region, initial encounter for fracture: Secondary | ICD-10-CM | POA: Diagnosis present

## 2021-12-06 DIAGNOSIS — K8070 Calculus of gallbladder and bile duct without cholecystitis without obstruction: Secondary | ICD-10-CM | POA: Diagnosis present

## 2021-12-06 DIAGNOSIS — Z8249 Family history of ischemic heart disease and other diseases of the circulatory system: Secondary | ICD-10-CM

## 2021-12-06 DIAGNOSIS — K219 Gastro-esophageal reflux disease without esophagitis: Secondary | ICD-10-CM | POA: Diagnosis present

## 2021-12-06 DIAGNOSIS — Z538 Procedure and treatment not carried out for other reasons: Secondary | ICD-10-CM | POA: Diagnosis not present

## 2021-12-06 DIAGNOSIS — E890 Postprocedural hypothyroidism: Secondary | ICD-10-CM | POA: Diagnosis present

## 2021-12-06 DIAGNOSIS — I97711 Intraoperative cardiac arrest during other surgery: Secondary | ICD-10-CM | POA: Diagnosis not present

## 2021-12-06 DIAGNOSIS — G9341 Metabolic encephalopathy: Secondary | ICD-10-CM | POA: Diagnosis present

## 2021-12-06 DIAGNOSIS — K660 Peritoneal adhesions (postprocedural) (postinfection): Secondary | ICD-10-CM | POA: Diagnosis present

## 2021-12-06 DIAGNOSIS — Z96612 Presence of left artificial shoulder joint: Secondary | ICD-10-CM | POA: Diagnosis present

## 2021-12-06 DIAGNOSIS — M4316 Spondylolisthesis, lumbar region: Secondary | ICD-10-CM | POA: Diagnosis present

## 2021-12-06 DIAGNOSIS — K8050 Calculus of bile duct without cholangitis or cholecystitis without obstruction: Secondary | ICD-10-CM | POA: Diagnosis not present

## 2021-12-06 DIAGNOSIS — T8859XA Other complications of anesthesia, initial encounter: Secondary | ICD-10-CM

## 2021-12-06 HISTORY — DX: Other complications of anesthesia, initial encounter: T88.59XA

## 2021-12-06 LAB — URINE DRUG SCREEN, QUALITATIVE (ARMC ONLY)
Amphetamines, Ur Screen: NOT DETECTED
Barbiturates, Ur Screen: NOT DETECTED
Benzodiazepine, Ur Scrn: POSITIVE — AB
Cannabinoid 50 Ng, Ur ~~LOC~~: NOT DETECTED
Cocaine Metabolite,Ur ~~LOC~~: NOT DETECTED
MDMA (Ecstasy)Ur Screen: NOT DETECTED
Methadone Scn, Ur: NOT DETECTED
Opiate, Ur Screen: NOT DETECTED
Phencyclidine (PCP) Ur S: NOT DETECTED
Tricyclic, Ur Screen: NOT DETECTED

## 2021-12-06 LAB — CBC
HCT: 32.9 % — ABNORMAL LOW (ref 36.0–46.0)
Hemoglobin: 10.7 g/dL — ABNORMAL LOW (ref 12.0–15.0)
MCH: 29.8 pg (ref 26.0–34.0)
MCHC: 32.5 g/dL (ref 30.0–36.0)
MCV: 91.6 fL (ref 80.0–100.0)
Platelets: 392 10*3/uL (ref 150–400)
RBC: 3.59 MIL/uL — ABNORMAL LOW (ref 3.87–5.11)
RDW: 17.2 % — ABNORMAL HIGH (ref 11.5–15.5)
WBC: 13.3 10*3/uL — ABNORMAL HIGH (ref 4.0–10.5)
nRBC: 0 % (ref 0.0–0.2)

## 2021-12-06 LAB — URINALYSIS, COMPLETE (UACMP) WITH MICROSCOPIC
Bilirubin Urine: NEGATIVE
Glucose, UA: NEGATIVE mg/dL
Hgb urine dipstick: NEGATIVE
Ketones, ur: NEGATIVE mg/dL
Leukocytes,Ua: NEGATIVE
Nitrite: POSITIVE — AB
Protein, ur: NEGATIVE mg/dL
Specific Gravity, Urine: 1.009 (ref 1.005–1.030)
pH: 6 (ref 5.0–8.0)

## 2021-12-06 LAB — ECHOCARDIOGRAM COMPLETE
AR max vel: 2.22 cm2
AV Area VTI: 1.94 cm2
AV Area mean vel: 2.02 cm2
AV Mean grad: 7 mmHg
AV Peak grad: 15.5 mmHg
Ao pk vel: 1.97 m/s
Area-P 1/2: 6.43 cm2
Height: 61 in
MV VTI: 3 cm2
S' Lateral: 2.48 cm
Weight: 1920 oz

## 2021-12-06 LAB — BLOOD GAS, ARTERIAL
Acid-Base Excess: 0.7 mmol/L (ref 0.0–2.0)
Acid-base deficit: 0.3 mmol/L (ref 0.0–2.0)
Bicarbonate: 27.4 mmol/L (ref 20.0–28.0)
Bicarbonate: 27.8 mmol/L (ref 20.0–28.0)
FIO2: 0.5 %
MECHVT: 400 mL
O2 Saturation: 98.5 %
O2 Saturation: 100 %
PEEP: 5 cmH2O
Patient temperature: 37
Patient temperature: 37
RATE: 18 resp/min
pCO2 arterial: 57 mmHg — ABNORMAL HIGH (ref 32–48)
pCO2 arterial: 54 mmHg — ABNORMAL HIGH (ref 32–48)
pH, Arterial: 7.29 — ABNORMAL LOW (ref 7.35–7.45)
pH, Arterial: 7.32 — ABNORMAL LOW (ref 7.35–7.45)
pO2, Arterial: 452 mmHg — ABNORMAL HIGH (ref 83–108)
pO2, Arterial: 139 mmHg — ABNORMAL HIGH (ref 83–108)

## 2021-12-06 LAB — COMPREHENSIVE METABOLIC PANEL
ALT: 18 U/L (ref 0–44)
AST: 26 U/L (ref 15–41)
Albumin: 2.2 g/dL — ABNORMAL LOW (ref 3.5–5.0)
Alkaline Phosphatase: 120 U/L (ref 38–126)
Anion gap: 5 (ref 5–15)
BUN: 8 mg/dL (ref 8–23)
CO2: 21 mmol/L — ABNORMAL LOW (ref 22–32)
Calcium: 6.5 mg/dL — ABNORMAL LOW (ref 8.9–10.3)
Chloride: 114 mmol/L — ABNORMAL HIGH (ref 98–111)
Creatinine, Ser: 0.51 mg/dL (ref 0.44–1.00)
GFR, Estimated: >60 mL/min (ref >60)
Glucose, Bld: 137 mg/dL — ABNORMAL HIGH (ref 70–99)
Potassium: 2.7 mmol/L — CL (ref 3.5–5.1)
Sodium: 140 mmol/L (ref 135–145)
Total Bilirubin: 0.6 mg/dL (ref 0.3–1.2)
Total Protein: 4.5 g/dL — ABNORMAL LOW (ref 6.5–8.1)

## 2021-12-06 LAB — TROPONIN I (HIGH SENSITIVITY)
Troponin I (High Sensitivity): 11 ng/L (ref <18)
Troponin I (High Sensitivity): 42 ng/L — ABNORMAL HIGH (ref <18)

## 2021-12-06 LAB — LACTIC ACID, PLASMA
Lactic Acid, Venous: 1.1 mmol/L (ref 0.5–1.9)
Lactic Acid, Venous: 0.9 mmol/L (ref 0.5–1.9)

## 2021-12-06 LAB — PROTIME-INR
INR: 1 (ref 0.8–1.2)
Prothrombin Time: 12.8 seconds (ref 11.4–15.2)

## 2021-12-06 LAB — PROCALCITONIN: Procalcitonin: <0.1 ng/mL

## 2021-12-06 LAB — GLUCOSE, CAPILLARY: Glucose-Capillary: 147 mg/dL — ABNORMAL HIGH (ref 70–99)

## 2021-12-06 IMAGING — CT CT CHEST-ABD-PELV W/ CM
2 of 5 series · 12 of 36 positions shown, 14 images · IV contrast (agent unspecified)
Comparison: MRI abdomen [DATE]. Lung cancer screening chest CT
[DATE].

CLINICAL DATA: Sepsis and weight loss. Choledocholithiasis, status
post ERCP on [DATE]. Status post laparoscopic cholecystectomy
[DATE]. Intraoperative cardiac arrest.

EXAM:
CT CHEST, ABDOMEN, AND PELVIS WITH CONTRAST
TECHNIQUE: Multidetector CT imaging of the chest, abdomen and pelvis was
performed following the standard protocol during bolus
administration of intravenous contrast.

[Series 2: cap with · axial · 0.61mm/px · z∈[-382,+82]mm · 9 of 117 slices shown, 11 images]
[im 12/117  mediastinal]
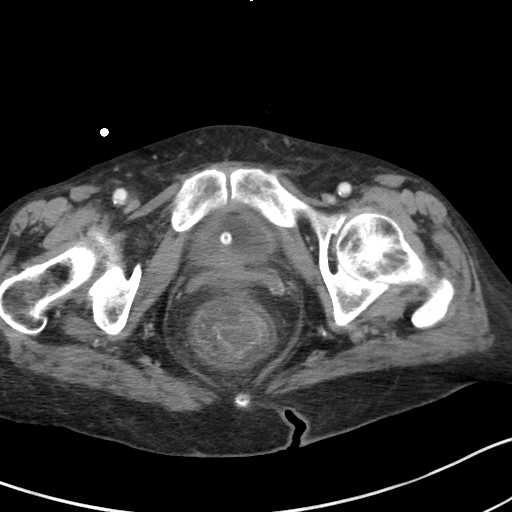
[im 12/117  bone]
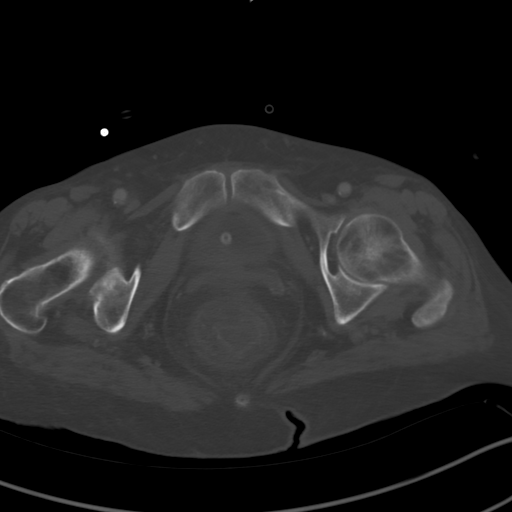
[im 24/117  mediastinal]
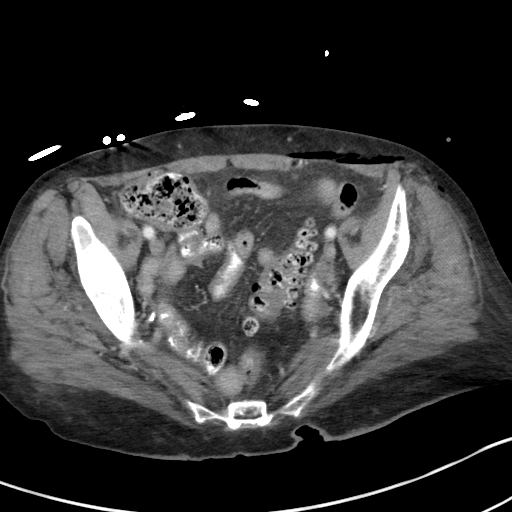
[im 35/117  mediastinal]
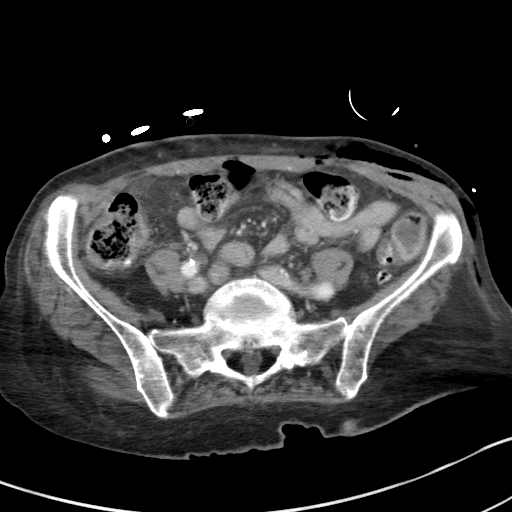
[im 47/117  mediastinal]
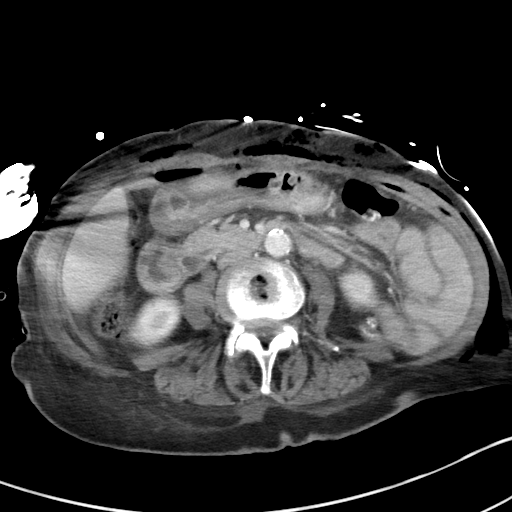
[im 59/117  mediastinal]
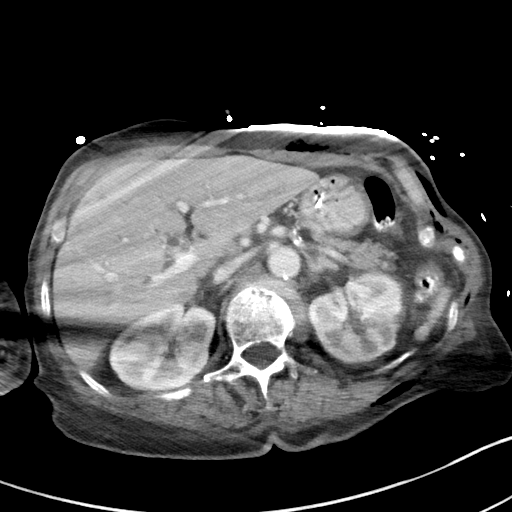
[im 70/117  mediastinal]
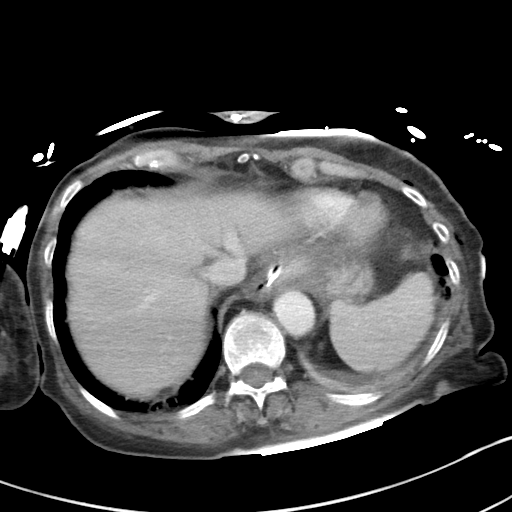
[im 82/117  mediastinal]
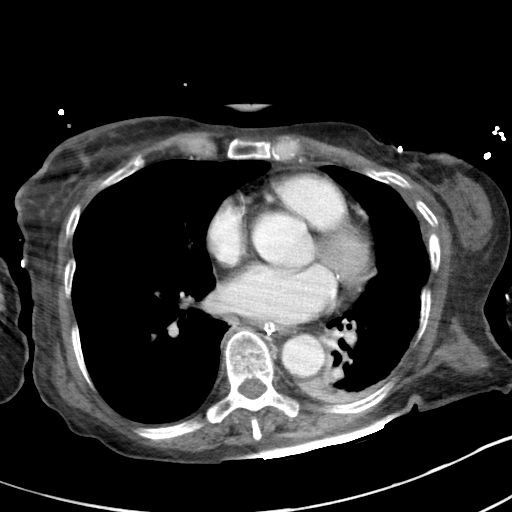
[im 93/117  mediastinal]
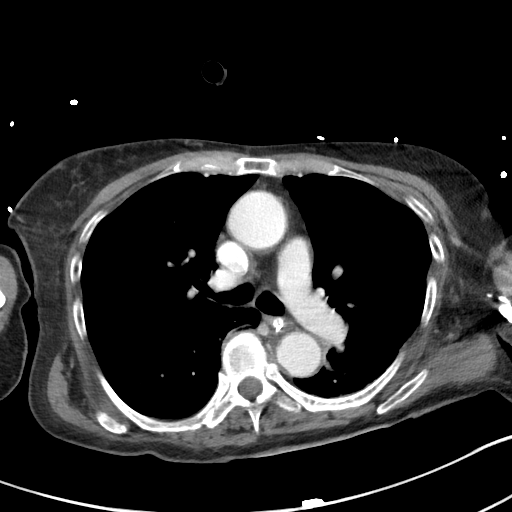
[im 105/117  mediastinal]
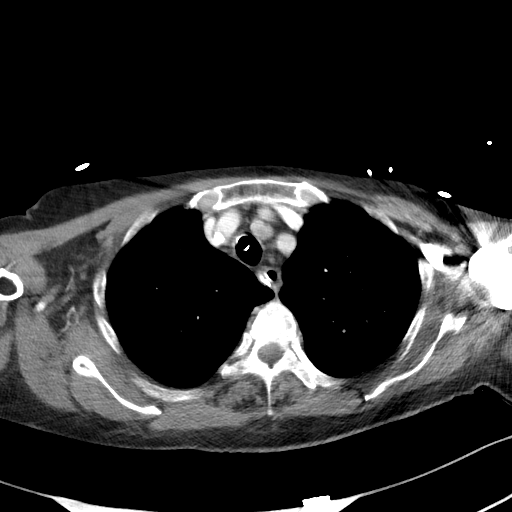
[im 105/117  bone]
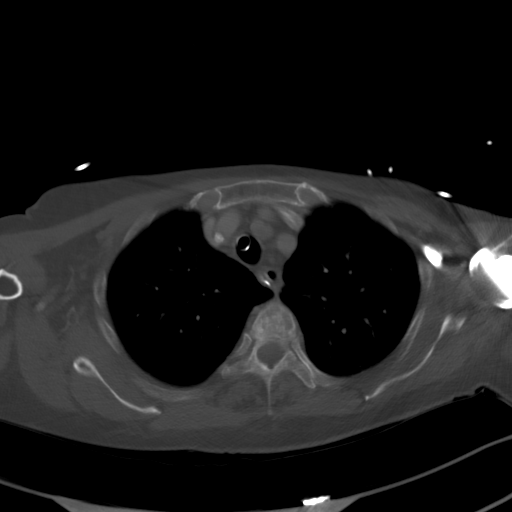

[Series 5: coronals · coronal · 0.67mm/px · 3 of 107 slices shown]
[im 22/107  mediastinal]
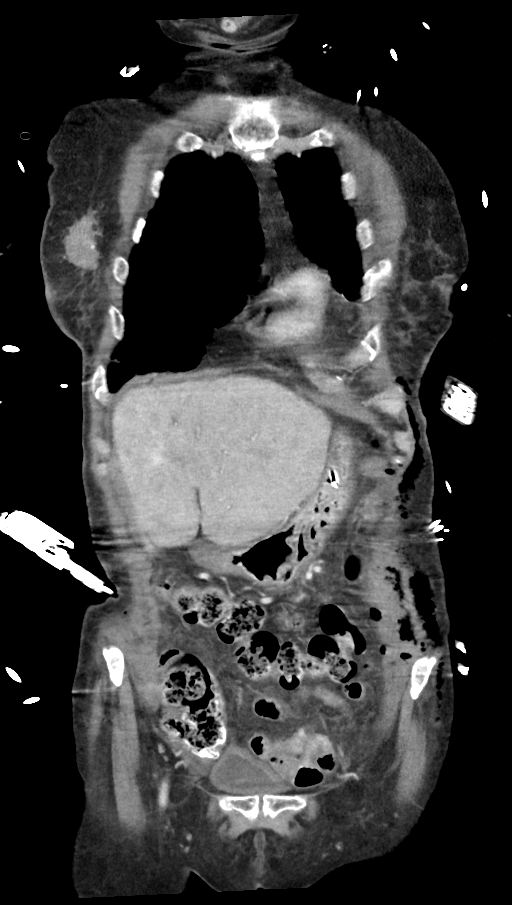
[im 43/107  mediastinal]
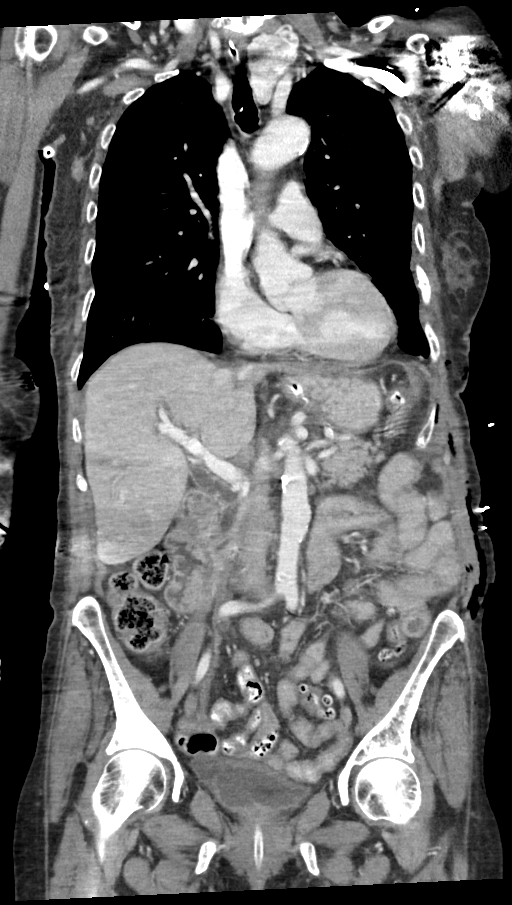
[im 64/107  mediastinal]
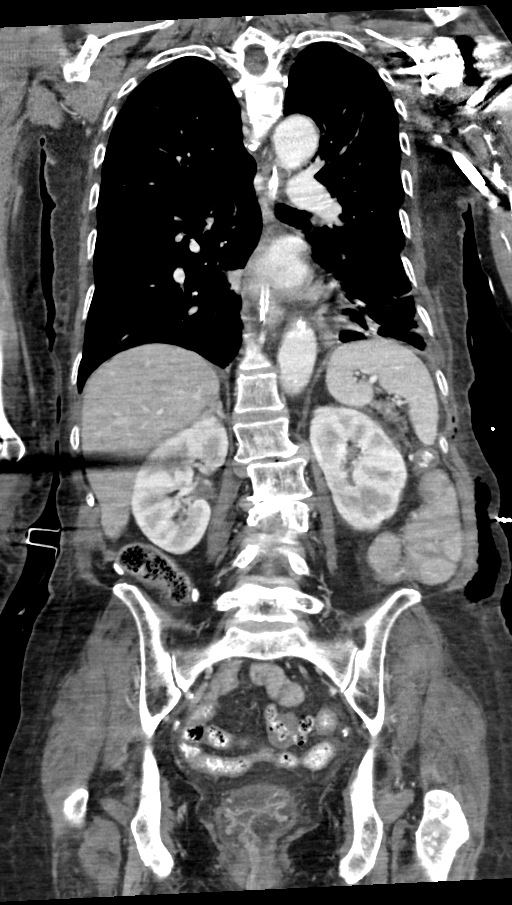

[12 of 36 positions shown; findings below may reference images not displayed]

RADIATION DOSE REDUCTION: This exam was performed according to the
departmental dose-optimization program which includes automated
exposure control, adjustment of the mA and/or kV according to
patient size and/or use of iterative reconstruction technique.

CONTRAST:  100mL OMNIPAQUE IOHEXOL 300 MG/ML  SOLN
FINDINGS: CT CHEST FINDINGS

Cardiovascular: The heart size is normal. No substantial pericardial
effusion. Coronary artery calcification is evident. Mild
atherosclerotic calcification is noted in the wall of the thoracic
aorta. Endotracheal tube tip is in the midtrachea. NG tube
visualized in the esophagus.

Mediastinum/Nodes: Possible right hemithyroidectomy. Left thyroid
lobe is enlarged and heterogeneous with numerous tiny nodules
evident.No mediastinal lymphadenopathy. There is no hilar
lymphadenopathy. The esophagus has normal imaging features. There is
no axillary lymphadenopathy.

Lungs/Pleura: Right lower lobe pulmonary nodule measuring 7 mm on
[DATE] is stable since [MK] consistent with benign etiology. No
followup recommended. Left base collapse/consolidation evident
without pleural effusion. No evidence for pneumothorax.

Musculoskeletal: Multiple bilateral anterior rib fractures
compatible with the reported history of CPR.

CT ABDOMEN PELVIS FINDINGS

Hepatobiliary: No suspicious focal abnormality within the liver
parenchyma. Mild periportal edema evident. Gallbladder surgically
absent with trace fluid in the gallbladder fossa, not unexpected
given surgery earlier today. Common bile duct measures up to 7 mm
diameter with 5 mm hyperattenuating focus projecting in the
dependent aspect of the distal common bile duct lumen, highly
suspicious for choledocholithiasis (image 70/series 2 and coronal
image 47 of series 5).

Pancreas: No focal mass lesion. No dilatation of the main duct. No
intraparenchymal cyst. No peripancreatic edema.

Spleen: No splenomegaly. No focal mass lesion.

Adrenals/Urinary Tract: No adrenal nodule or mass. No suspicious
mass lesion or hydronephrosis in either kidney. Mild left
perinephric edema is nonspecific. No altered perfusion to the left
renal parenchyma to suggest pyelonephritis. No evidence for
hydroureter. Foley catheter identified in the urinary bladder. Air
in the bladder lumen is compatible with the instrumentation.

Stomach/Bowel: Stomach is decompressed. NG tube tip is in the mid
stomach. Duodenum is normally positioned as is the ligament of
Treitz. No small bowel wall thickening. No small bowel dilatation.
The terminal ileum is normal. The appendix is normal. No gross
colonic mass. No colonic wall thickening. Diverticular changes are
noted in the left colon without evidence of diverticulitis.
Prominent circumferential wall thickening noted distal rectum into
the anus.

Vascular/Lymphatic: There is mild atherosclerotic calcification of
the abdominal aorta without aneurysm. There is no gastrohepatic or
hepatoduodenal ligament lymphadenopathy. No retroperitoneal or
mesenteric lymphadenopathy. No pelvic sidewall lymphadenopathy.

Reproductive: The uterus is unremarkable.  There is no adnexal mass.

Other: Small volume intraperitoneal free air evident, consistent
with surgery earlier today. No substantial intraperitoneal free
fluid.

Musculoskeletal: Gas in the anterior and left lateral abdominal wall
is compatible with cholecystectomy earlier today.
IMPRESSION: 1. Mild dilatation of the common bile duct with 5 mm
hyperattenuating stone projecting in the dependent aspect of the
distal common bile duct lumen, consistent with choledocholithiasis.
2. Left base collapse/consolidation without pleural effusion.
3. Multiple bilateral anterior rib fractures compatible with the
reported history of CPR. No evidence for pneumothorax.
4. Prominent circumferential wall thickening distal rectum into the
anus. This could be infectious/inflammatory or related to neoplasm.
5. Mild periportal edema with trace free fluid in the gallbladder
fossa, not unexpected given cholecystectomy earlier today.
6. Small volume intraperitoneal free air with soft tissue gas in the
anterior left lateral abdominal wall, findings compatible with
surgery earlier today.
7. Left colonic diverticulosis without diverticulitis.
8. Aortic Atherosclerosis ([MK]-[MK]).

## 2021-12-06 IMAGING — CT CT HEAD W/O CM
4 series · 17 of 47 positions shown, 19 images · non-contrast
Comparison: [DATE]

CLINICAL DATA: Neuro deficit.



[Series 2: head wo · axial · 0.40mm/px · z∈[+253,+363]mm · 7 of 30 slices shown, 9 images]
[im 4/30  brain]
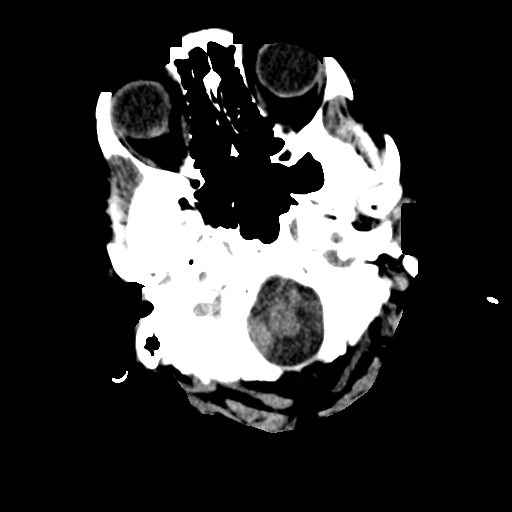
[im 4/30  bone]
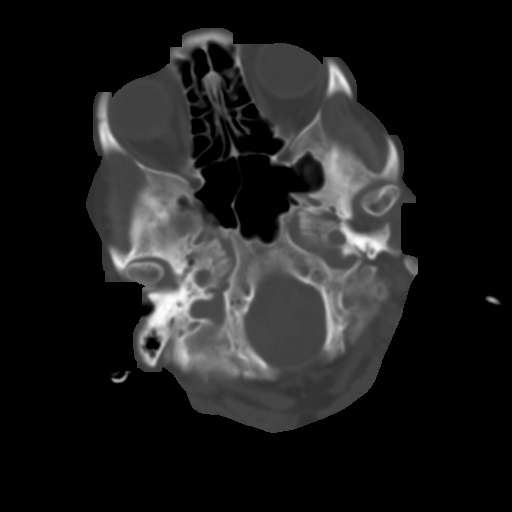
[im 8/30  brain]
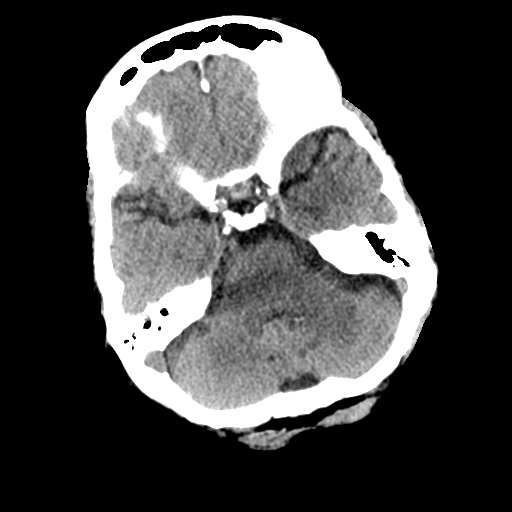
[im 11/30  brain]
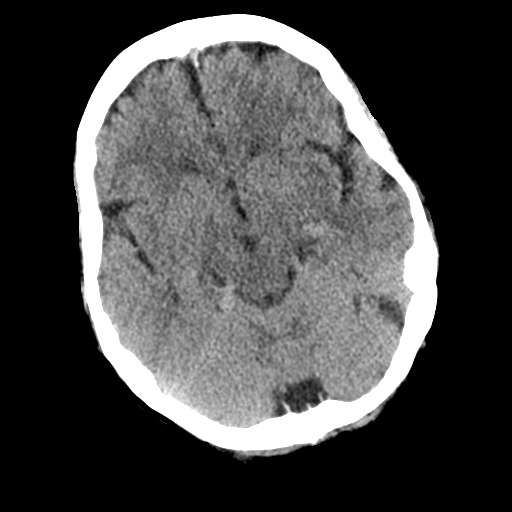
[im 15/30  brain]
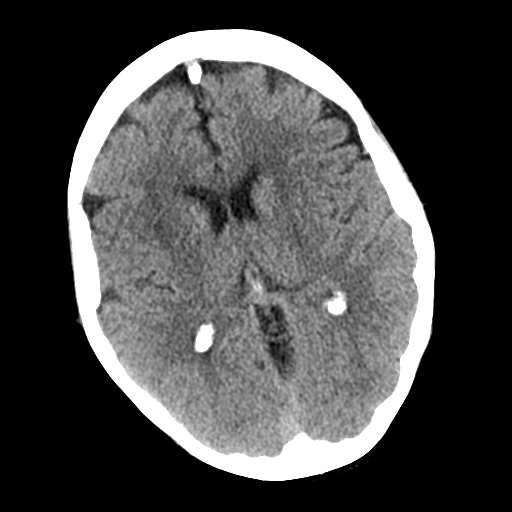
[im 19/30  brain]
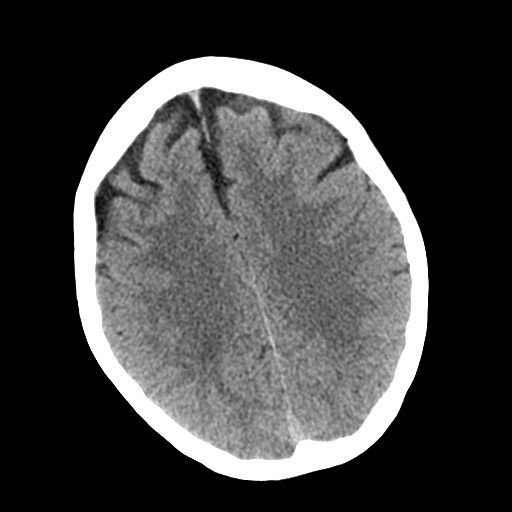
[im 19/30  bone]
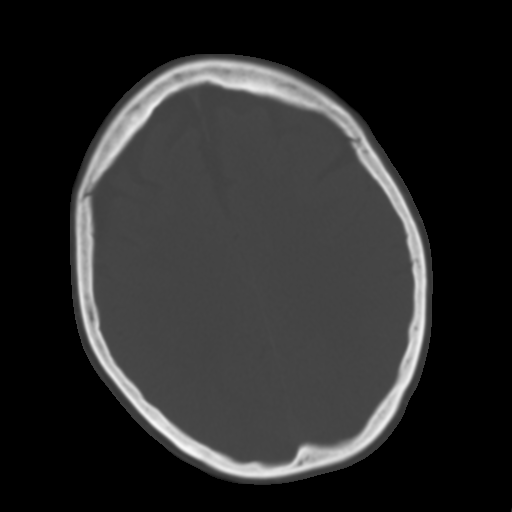
[im 22/30  brain]
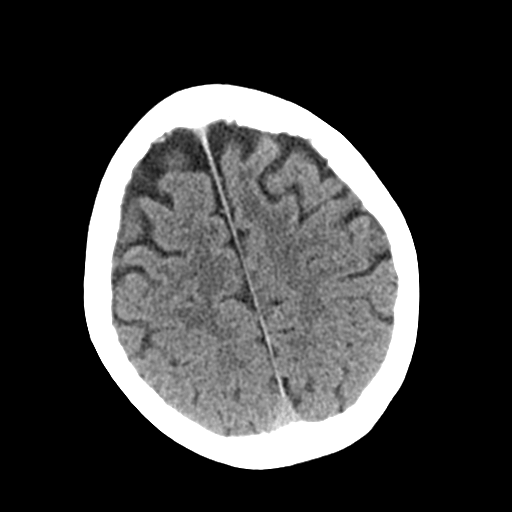
[im 26/30  brain]
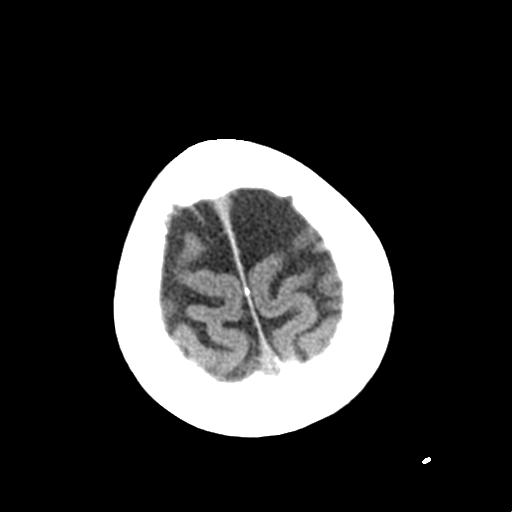

[Series 3: head bone · axial · 0.40mm/px · z∈[+252,+304]mm · 4 of 75 slices shown]
[im 8/75  bone]
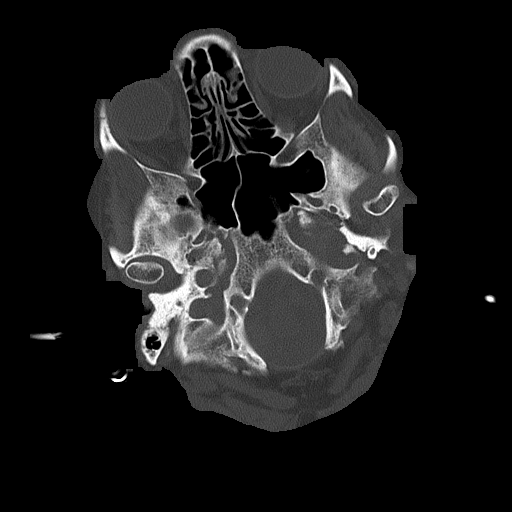
[im 15/75  bone]
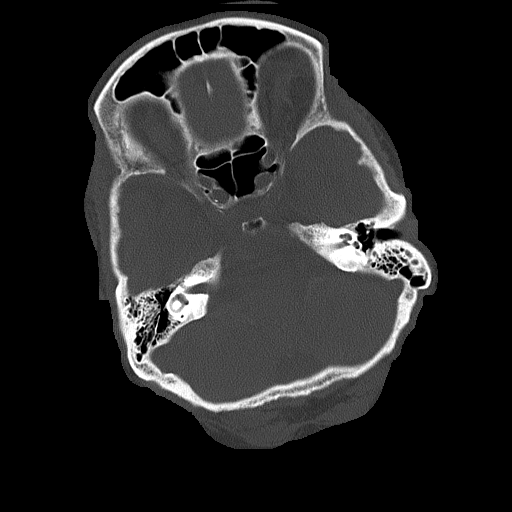
[im 23/75  bone]
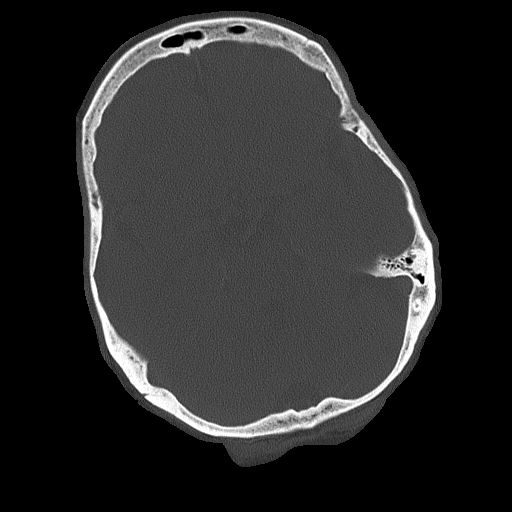
[im 34/75  bone]
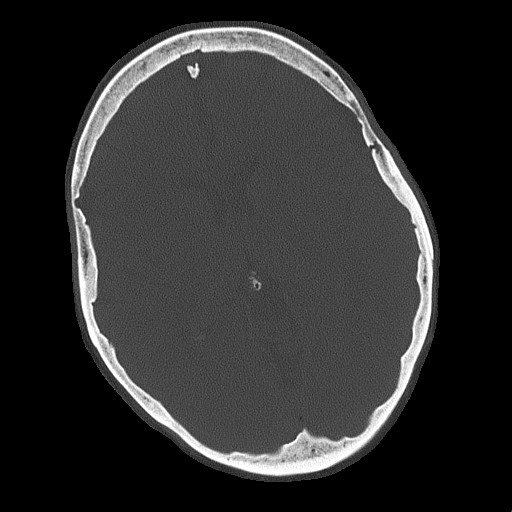

[Series 4: coronal soft tissue · coronal · 0.30mm/px · 3 of 65 slices shown]
[im 22/65  brain]
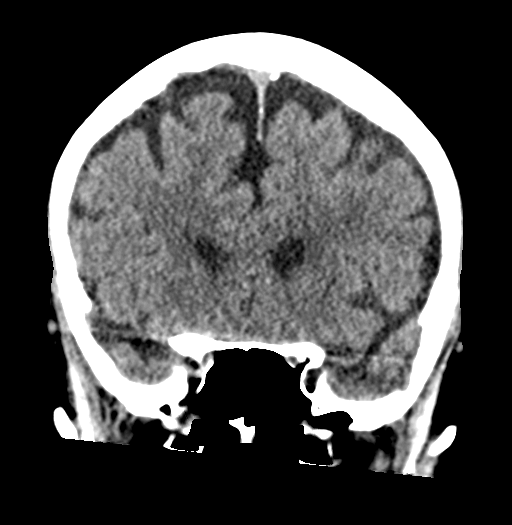
[im 29/65  brain]
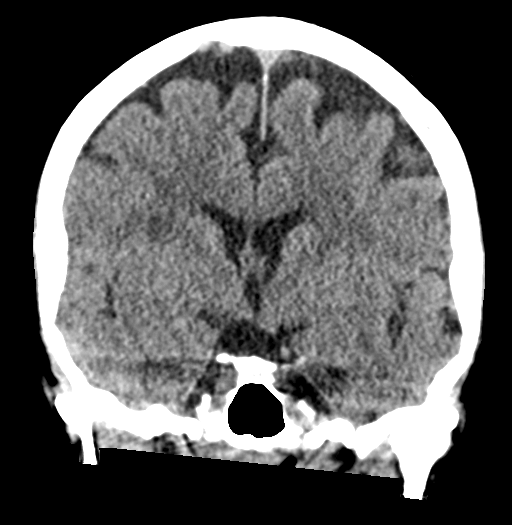
[im 36/65  brain]
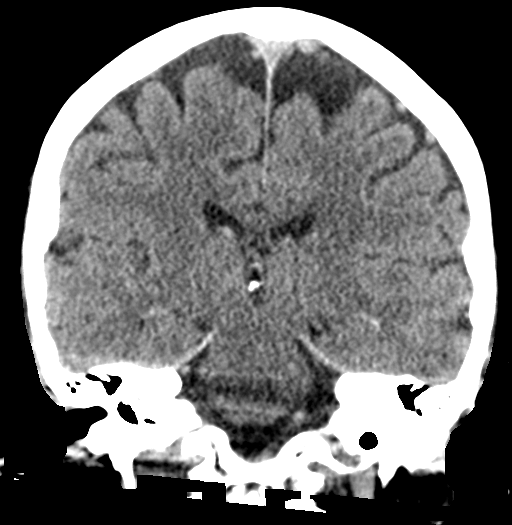

[Series 5: sagittal soft tissue · sagittal · 0.34mm/px · 3 of 51 slices shown]
[im 18/51  brain]
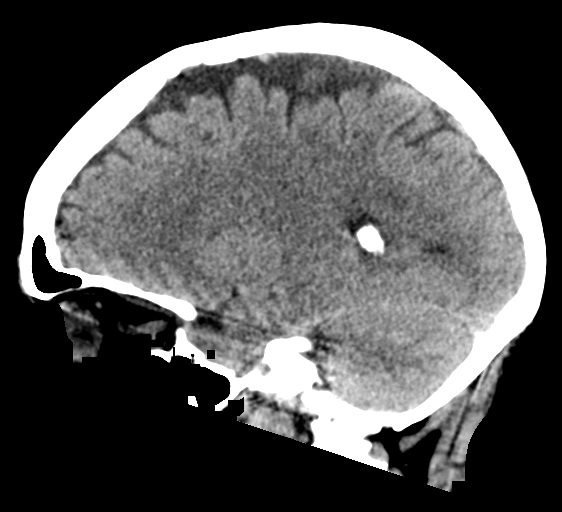
[im 26/51  brain]
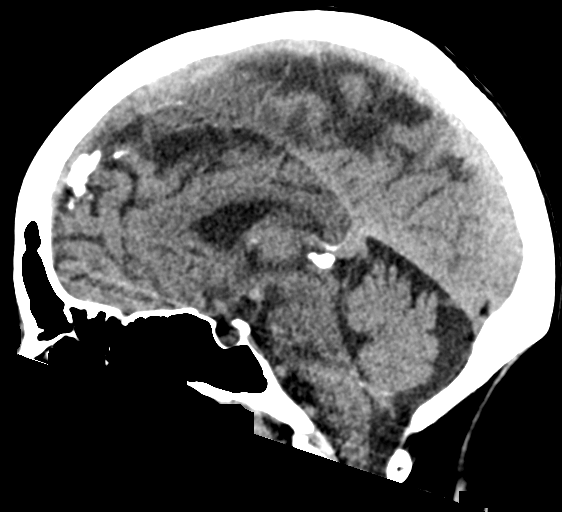
[im 33/51  brain]
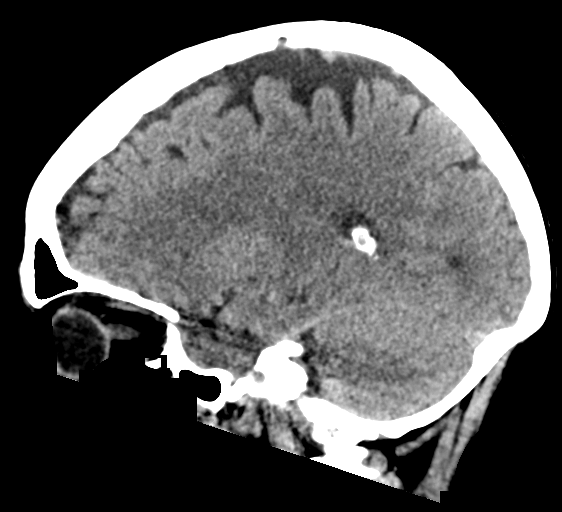

[17 of 47 positions shown; findings below may reference images not displayed]

FINDINGS: Brain: There is no evidence for acute hemorrhage, hydrocephalus,
mass lesion, or abnormal extra-axial fluid collection. No definite
CT evidence for acute infarction. Patchy low attenuation in the deep
hemispheric and periventricular white matter is nonspecific, but
likely reflects chronic microvascular ischemic demyelination.

Vascular: No hyperdense vessel or unexpected calcification.

Skull: No evidence for fracture. No worrisome lytic or sclerotic
lesion.

Sinuses/Orbits: The visualized paranasal sinuses and mastoid air
cells are clear. Visualized portions of the globes and intraorbital
fat are unremarkable.

Other: None.
IMPRESSION: No acute intracranial abnormality.

## 2021-12-06 IMAGING — DX DG CHEST 1V PORT
1 series · 1 of 1 positions shown · non-contrast
Comparison: [DATE]

CLINICAL DATA: Encounter for intubation

EXAM:
PORTABLE CHEST 1 VIEW

[chest ap]
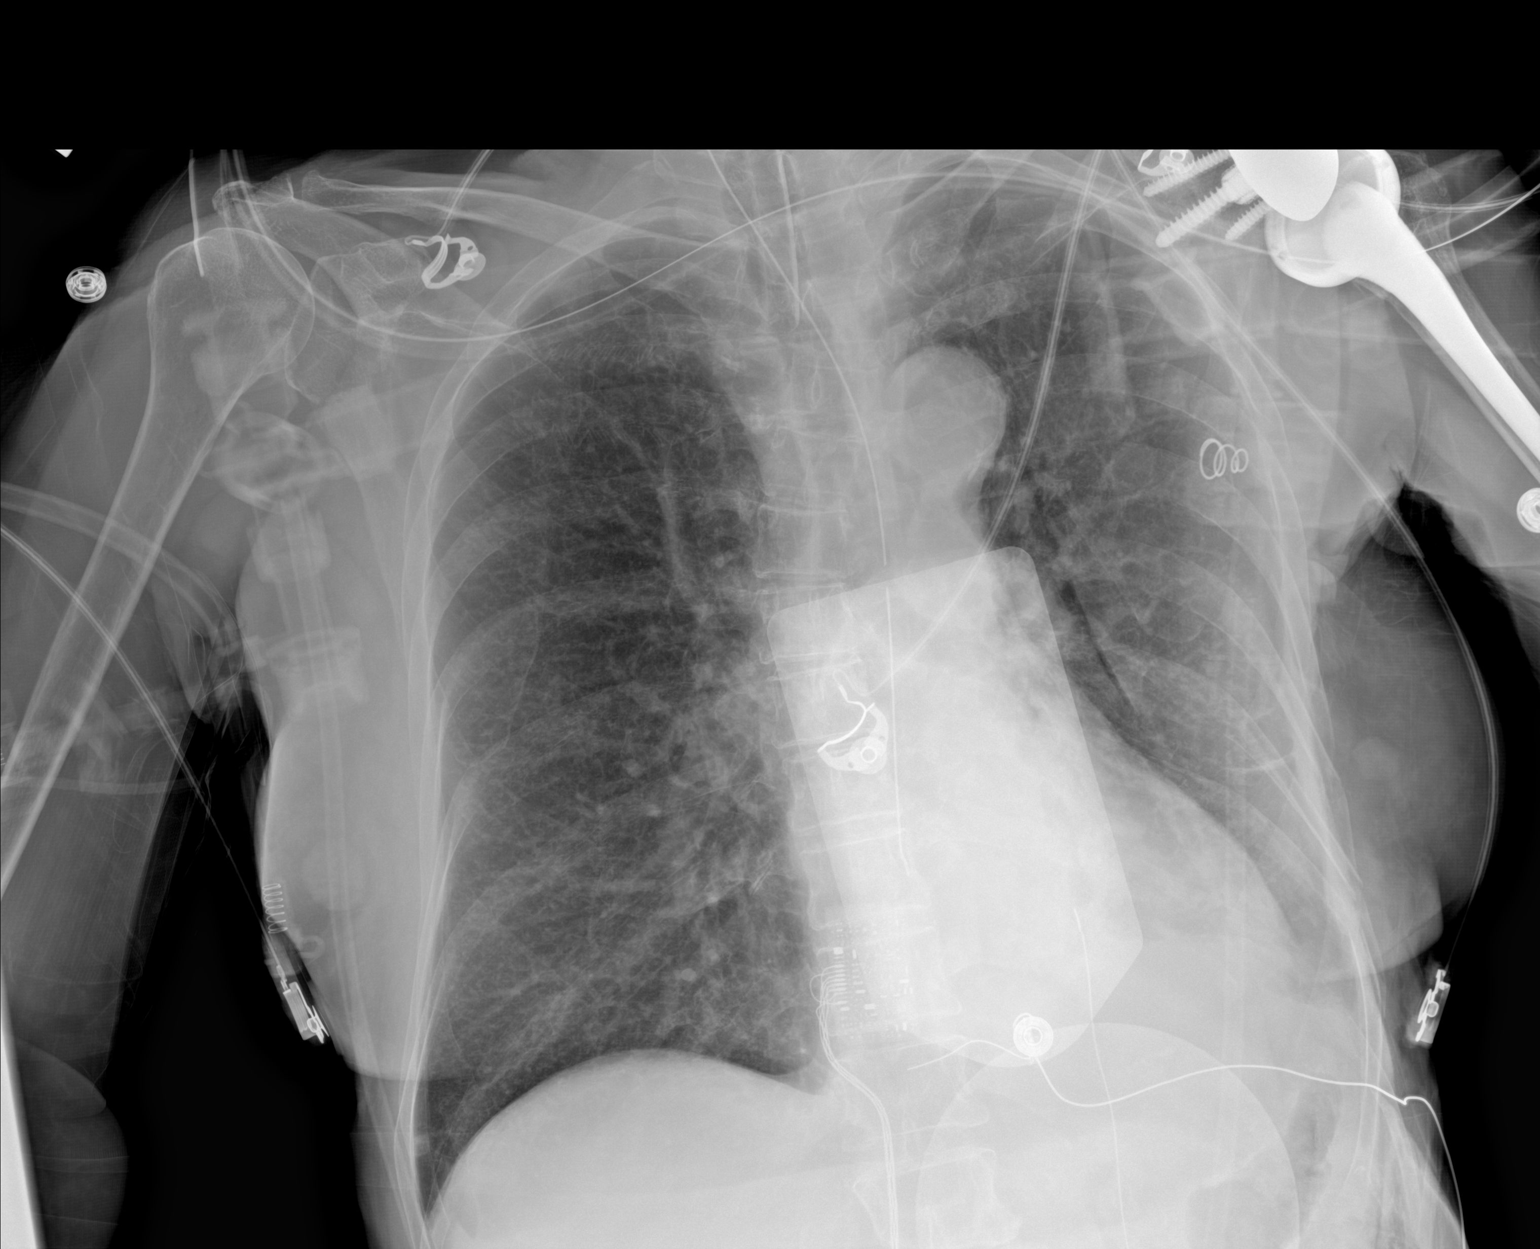

[1 of 1 positions shown; findings below may reference images not displayed]

FINDINGS: Endotracheal tube approximately 8 mm above the carina.

NG tube in the esophagus. The tip is in the distal third of the
esophagus and the side hole is in the mid esophagus.

Left lower lobe consolidation and small left effusion. Right lung is
clear. Negative for edema

Left shoulder replacement
IMPRESSION: Endotracheal tube is high, 8 cm above the carina

NG tube in the mid esophagus, recommend advancing 20 cm.

Left lower lobe atelectasis/infiltrate and small left effusion.

## 2021-12-06 IMAGING — DX DG ABDOMEN 1V
1 series · 1 of 1 positions shown · non-contrast
Comparison: None.

CLINICAL DATA: Orogastric tube placement.

EXAM:
ABDOMEN - 1 VIEW

[abdomen supine]
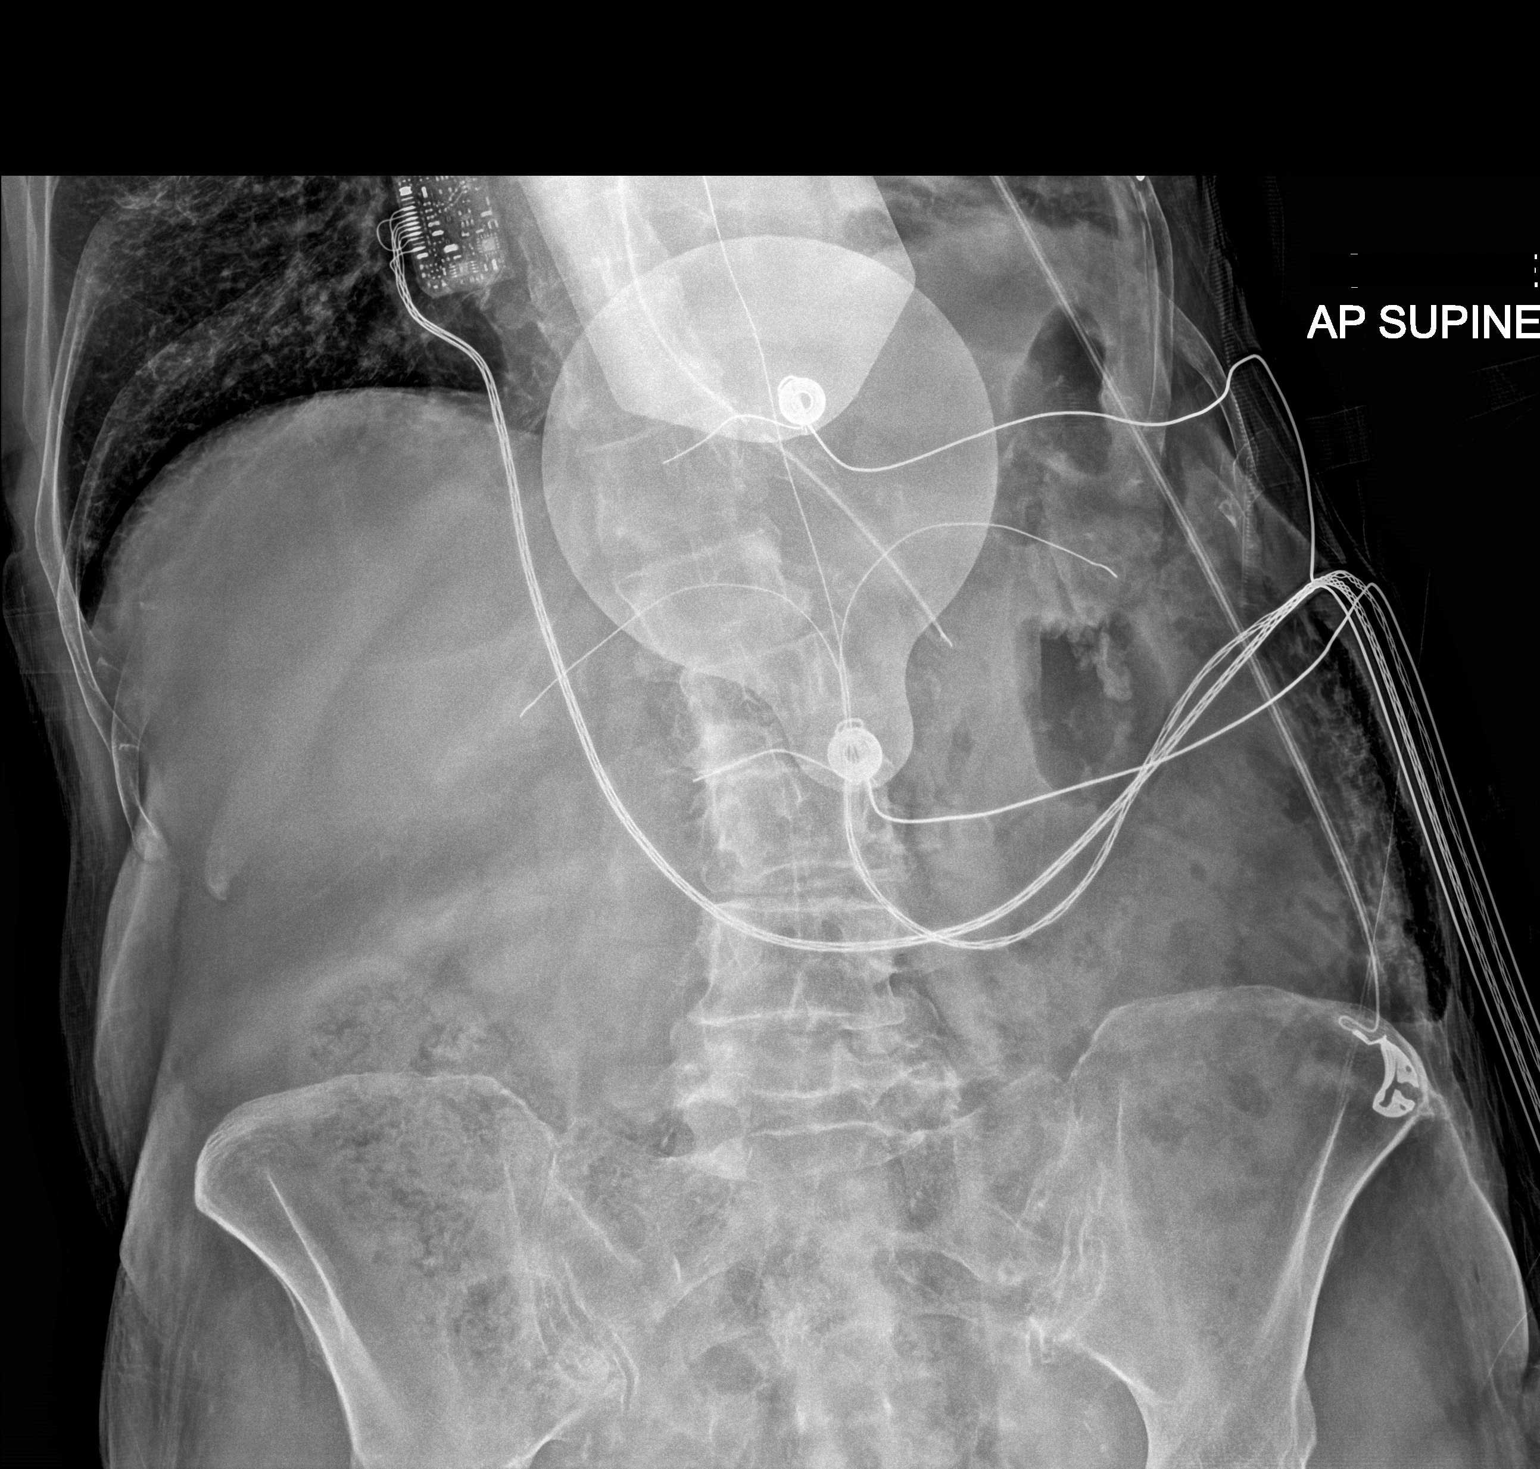

[1 of 1 positions shown; findings below may reference images not displayed]

FINDINGS: The bowel gas pattern is normal. Distal tip of nasogastric tube is
seen in expected position of proximal stomach. No radio-opaque
calculi or other significant radiographic abnormality are seen.
IMPRESSION: Distal tip of nasogastric tube seen in expected position of proximal
stomach.

## 2021-12-06 SURGERY — CHOLECYSTECTOMY, ROBOT-ASSISTED, LAPAROSCOPIC
Anesthesia: General | Site: Abdomen

## 2021-12-06 MED ORDER — STERILE WATER FOR INJECTION IJ SOLN
INTRAMUSCULAR | Status: AC
  Administered 2021-12-06: 10 mL
  Filled 2021-12-06: qty 10

## 2021-12-06 MED ORDER — PROPOFOL 10 MG/ML IV BOLUS
INTRAVENOUS | Status: AC
  Filled 2021-12-06: qty 20

## 2021-12-06 MED ORDER — VASOPRESSIN 20 UNIT/ML IV SOLN
INTRAVENOUS | Status: AC
  Filled 2021-12-06: qty 1

## 2021-12-06 MED ORDER — LIDOCAINE HCL 4 % MT SOLN
OROMUCOSAL | Status: DC | PRN
  Administered 2021-12-06: 4 mL via TOPICAL

## 2021-12-06 MED ORDER — SODIUM CHLORIDE 0.9% FLUSH
10.0000 mL | Freq: Two times a day (BID) | INTRAVENOUS | Status: DC
  Administered 2021-12-06 – 2021-12-07 (×2): 10 mL

## 2021-12-06 MED ORDER — LABETALOL HCL 5 MG/ML IV SOLN
10.0000 mg | Freq: Once | INTRAVENOUS | Status: AC
  Administered 2021-12-06: 10 mg via INTRAVENOUS

## 2021-12-06 MED ORDER — PHENYLEPHRINE 40 MCG/ML (10ML) SYRINGE FOR IV PUSH (FOR BLOOD PRESSURE SUPPORT)
PREFILLED_SYRINGE | INTRAVENOUS | Status: DC | PRN
Start: 2021-12-06 — End: 2021-12-06
  Administered 2021-12-06 (×2): 80 ug via INTRAVENOUS

## 2021-12-06 MED ORDER — ROCURONIUM BROMIDE 100 MG/10ML IV SOLN
INTRAVENOUS | Status: DC | PRN
  Administered 2021-12-06: 20 mg via INTRAVENOUS
  Administered 2021-12-06: 50 mg via INTRAVENOUS
  Administered 2021-12-06: 40 mg via INTRAVENOUS

## 2021-12-06 MED ORDER — MIDAZOLAM HCL 2 MG/2ML IJ SOLN
INTRAMUSCULAR | Status: AC
  Filled 2021-12-06: qty 2

## 2021-12-06 MED ORDER — MAGNESIUM SULFATE 2 GM/50ML IV SOLN
2.0000 g | Freq: Once | INTRAVENOUS | Status: AC
  Administered 2021-12-06: 2 g via INTRAVENOUS
  Filled 2021-12-06: qty 50

## 2021-12-06 MED ORDER — IOHEXOL 300 MG/ML  SOLN
100.0000 mL | Freq: Once | INTRAMUSCULAR | Status: AC | PRN
  Administered 2021-12-06: 100 mL via INTRAVENOUS

## 2021-12-06 MED ORDER — ONDANSETRON HCL 4 MG/2ML IJ SOLN
INTRAMUSCULAR | Status: DC | PRN
  Administered 2021-12-06: 4 mg via INTRAVENOUS

## 2021-12-06 MED ORDER — FENTANYL CITRATE (PF) 100 MCG/2ML IJ SOLN
INTRAMUSCULAR | Status: DC | PRN
  Administered 2021-12-06 (×2): 50 ug via INTRAVENOUS

## 2021-12-06 MED ORDER — LIDOCAINE HCL (PF) 2 % IJ SOLN
INTRAMUSCULAR | Status: AC
  Filled 2021-12-06: qty 5

## 2021-12-06 MED ORDER — LIDOCAINE-EPINEPHRINE (PF) 1 %-1:200000 IJ SOLN
INTRAMUSCULAR | Status: DC | PRN
  Administered 2021-12-06: 20 mL via INTRAMUSCULAR

## 2021-12-06 MED ORDER — EPHEDRINE SULFATE (PRESSORS) 50 MG/ML IJ SOLN
INTRAMUSCULAR | Status: DC | PRN
Start: 2021-12-06 — End: 2021-12-06
  Administered 2021-12-06 (×2): 10 mg via INTRAVENOUS

## 2021-12-06 MED ORDER — PROPOFOL 10 MG/ML IV BOLUS
INTRAVENOUS | Status: DC | PRN
Start: 2021-12-06 — End: 2021-12-06
  Administered 2021-12-06: 100 mg via INTRAVENOUS

## 2021-12-06 MED ORDER — CHLORHEXIDINE GLUCONATE CLOTH 2 % EX PADS: 6.0000 | MEDICATED_PAD | Freq: Every day | CUTANEOUS | Status: DC

## 2021-12-06 MED ORDER — CHLORHEXIDINE GLUCONATE 0.12 % MT SOLN
OROMUCOSAL | Status: AC
  Administered 2021-12-06: 15 mL via OROMUCOSAL
  Filled 2021-12-06: qty 15

## 2021-12-06 MED ORDER — ALBUTEROL SULFATE (2.5 MG/3ML) 0.083% IN NEBU: 2.5000 mg | INHALATION_SOLUTION | RESPIRATORY_TRACT | Status: DC | PRN

## 2021-12-06 MED ORDER — NICARDIPINE HCL IN NACL 20-0.86 MG/200ML-% IV SOLN
3.0000 mg/h | INTRAVENOUS | Status: DC
  Filled 2021-12-06: qty 200

## 2021-12-06 MED ORDER — DEXAMETHASONE SODIUM PHOSPHATE 10 MG/ML IJ SOLN
INTRAMUSCULAR | Status: DC | PRN
  Administered 2021-12-06: 5 mg via INTRAVENOUS

## 2021-12-06 MED ORDER — PHENYLEPHRINE HCL (PRESSORS) 10 MG/ML IV SOLN
INTRAVENOUS | Status: DC | PRN
  Administered 2021-12-06: 160 ug via INTRAVENOUS

## 2021-12-06 MED ORDER — MORPHINE SULFATE (PF) 2 MG/ML IV SOLN
1.0000 mg | INTRAVENOUS | Status: DC | PRN
  Administered 2021-12-06 – 2021-12-07 (×2): 1 mg via INTRAVENOUS
  Administered 2021-12-07: 2 mg via INTRAVENOUS
  Filled 2021-12-06 (×2): qty 1

## 2021-12-06 MED ORDER — LIDOCAINE HCL (PF) 1 % IJ SOLN
INTRAMUSCULAR | Status: AC
  Filled 2021-12-06: qty 30

## 2021-12-06 MED ORDER — LABETALOL HCL 5 MG/ML IV SOLN
INTRAVENOUS | Status: AC
  Filled 2021-12-06: qty 4

## 2021-12-06 MED ORDER — VASOPRESSIN 20 UNIT/ML IV SOLN
INTRAVENOUS | Status: DC | PRN
  Administered 2021-12-06: 1 [IU] via INTRAVENOUS
  Administered 2021-12-06: 2 [IU] via INTRAVENOUS
  Administered 2021-12-06 (×2): 1 [IU] via INTRAVENOUS

## 2021-12-06 MED ORDER — PIPERACILLIN-TAZOBACTAM 3.375 G IVPB
3.3750 g | Freq: Three times a day (TID) | INTRAVENOUS | Status: DC
  Administered 2021-12-06 – 2021-12-07 (×3): 3.375 g via INTRAVENOUS
  Filled 2021-12-06 (×3): qty 50

## 2021-12-06 MED ORDER — MIDAZOLAM HCL 2 MG/2ML IJ SOLN
INTRAMUSCULAR | Status: AC
Start: 2021-12-06 — End: ?
  Filled 2021-12-06: qty 2

## 2021-12-06 MED ORDER — MIDAZOLAM HCL 2 MG/2ML IJ SOLN
INTRAMUSCULAR | Status: DC | PRN
  Administered 2021-12-06 (×3): 2 mg via INTRAVENOUS

## 2021-12-06 MED ORDER — GLYCOPYRROLATE 0.2 MG/ML IJ SOLN
INTRAMUSCULAR | Status: DC | PRN
  Administered 2021-12-06: .2 mg via INTRAVENOUS

## 2021-12-06 MED ORDER — MIDAZOLAM HCL 2 MG/2ML IJ SOLN: 1.0000 mg | INTRAMUSCULAR | Status: DC | PRN

## 2021-12-06 MED ORDER — POTASSIUM CHLORIDE 10 MEQ/100ML IV SOLN
10.0000 meq | INTRAVENOUS | Status: AC
  Administered 2021-12-06 (×5): 10 meq via INTRAVENOUS
  Filled 2021-12-06 (×5): qty 100

## 2021-12-06 MED ORDER — EPHEDRINE 5 MG/ML INJ
INTRAVENOUS | Status: AC
  Filled 2021-12-06: qty 5

## 2021-12-06 MED ORDER — FENTANYL CITRATE PF 50 MCG/ML IJ SOSY
25.0000 ug | PREFILLED_SYRINGE | INTRAMUSCULAR | Status: DC | PRN
  Administered 2021-12-06: 50 ug via INTRAVENOUS
  Filled 2021-12-06 (×2): qty 1

## 2021-12-06 MED ORDER — DEXAMETHASONE SODIUM PHOSPHATE 10 MG/ML IJ SOLN
INTRAMUSCULAR | Status: AC
  Filled 2021-12-06: qty 1

## 2021-12-06 MED ORDER — HYDRALAZINE HCL 20 MG/ML IJ SOLN
10.0000 mg | Freq: Four times a day (QID) | INTRAMUSCULAR | Status: DC | PRN
  Administered 2021-12-06 – 2021-12-07 (×2): 10 mg via INTRAVENOUS
  Filled 2021-12-06 (×2): qty 1

## 2021-12-06 MED ORDER — MORPHINE SULFATE (PF) 2 MG/ML IV SOLN
INTRAVENOUS | Status: AC
  Filled 2021-12-06: qty 1

## 2021-12-06 MED ORDER — LIDOCAINE HCL (CARDIAC) PF 100 MG/5ML IV SOSY
PREFILLED_SYRINGE | INTRAVENOUS | Status: DC | PRN
  Administered 2021-12-06: 60 mg via INTRAVENOUS

## 2021-12-06 MED ORDER — FENTANYL CITRATE PF 50 MCG/ML IJ SOSY: 25.0000 ug | PREFILLED_SYRINGE | INTRAMUSCULAR | Status: DC | PRN

## 2021-12-06 MED ORDER — PHENTOLAMINE MESYLATE 5 MG IJ SOLR
5.0000 mg | Freq: Once | INTRAMUSCULAR | Status: AC
  Administered 2021-12-06: 5 mg via SUBCUTANEOUS
  Filled 2021-12-06: qty 5

## 2021-12-06 MED ORDER — EPINEPHRINE PF 1 MG/ML IJ SOLN
INTRAMUSCULAR | Status: DC | PRN
  Administered 2021-12-06: 1 mg via INTRAVENOUS

## 2021-12-06 MED ORDER — NITROGLYCERIN 2 % TD OINT
1.0000 [in_us] | TOPICAL_OINTMENT | Freq: Three times a day (TID) | TRANSDERMAL | Status: DC
  Administered 2021-12-06 – 2021-12-07 (×3): 1 [in_us] via TOPICAL
  Filled 2021-12-06 (×3): qty 1

## 2021-12-06 MED ORDER — BUPIVACAINE-EPINEPHRINE (PF) 0.5% -1:200000 IJ SOLN
INTRAMUSCULAR | Status: AC
  Filled 2021-12-06: qty 30

## 2021-12-06 MED ORDER — FENTANYL CITRATE (PF) 100 MCG/2ML IJ SOLN
INTRAMUSCULAR | Status: AC
  Filled 2021-12-06: qty 2

## 2021-12-06 MED ORDER — PHENYLEPHRINE 40 MCG/ML (10ML) SYRINGE FOR IV PUSH (FOR BLOOD PRESSURE SUPPORT)
PREFILLED_SYRINGE | INTRAVENOUS | Status: AC
  Filled 2021-12-06: qty 10

## 2021-12-06 MED ORDER — TRAMADOL HCL 50 MG PO TABS
50.0000 mg | ORAL_TABLET | Freq: Two times a day (BID) | ORAL | Status: DC | PRN
Start: 2021-12-06 — End: 2021-12-07
  Administered 2021-12-06: 50 mg
  Filled 2021-12-06: qty 1

## 2021-12-06 MED ORDER — POLYETHYLENE GLYCOL 3350 17 G PO PACK
17.0000 g | PACK | Freq: Every day | ORAL | Status: DC
  Administered 2021-12-07: 17 g
  Filled 2021-12-06: qty 1

## 2021-12-06 MED ORDER — DOCUSATE SODIUM 50 MG/5ML PO LIQD
100.0000 mg | Freq: Two times a day (BID) | ORAL | Status: DC
  Filled 2021-12-06: qty 10

## 2021-12-06 MED ORDER — PIPERACILLIN-TAZOBACTAM 3.375 G IVPB: 3.3750 g | Freq: Three times a day (TID) | INTRAVENOUS | Status: DC

## 2021-12-06 MED ORDER — CEFAZOLIN SODIUM-DEXTROSE 2-4 GM/100ML-% IV SOLN
INTRAVENOUS | Status: AC
  Filled 2021-12-06: qty 100

## 2021-12-06 MED ORDER — SODIUM CHLORIDE 0.9% FLUSH: 10.0000 mL | INTRAVENOUS | Status: DC | PRN

## 2021-12-06 MED ORDER — ROCURONIUM BROMIDE 10 MG/ML (PF) SYRINGE
PREFILLED_SYRINGE | INTRAVENOUS | Status: AC
  Filled 2021-12-06: qty 10

## 2021-12-06 MED ORDER — ONDANSETRON HCL 4 MG/2ML IJ SOLN
INTRAMUSCULAR | Status: AC
  Filled 2021-12-06: qty 2

## 2021-12-06 MED ORDER — PANTOPRAZOLE SODIUM 40 MG IV SOLR
40.0000 mg | Freq: Every day | INTRAVENOUS | Status: DC
  Administered 2021-12-06 – 2021-12-07 (×2): 40 mg via INTRAVENOUS
  Filled 2021-12-06 (×2): qty 10

## 2021-12-06 SURGICAL SUPPLY — 53 items
ANCHOR TIS RET SYS 235ML (MISCELLANEOUS) ×2 IMPLANT
BAG INFUSER PRESSURE 100CC (MISCELLANEOUS) IMPLANT
BLADE SURG SZ11 CARB STEEL (BLADE) ×3 IMPLANT
CANNULA REDUC XI 12-8 STAPL (CANNULA) ×1
CANNULA REDUCER 12-8 DVNC XI (CANNULA) ×2 IMPLANT
CATH REDDICK CHOLANGI 4FR 50CM (CATHETERS) IMPLANT
CLIP LIGATING HEMO O LOK GREEN (MISCELLANEOUS) ×4 IMPLANT
DERMABOND ADVANCED (GAUZE/BANDAGES/DRESSINGS) ×1
DERMABOND ADVANCED .7 DNX12 (GAUZE/BANDAGES/DRESSINGS) ×2 IMPLANT
DRAPE ARM DVNC X/XI (DISPOSABLE) ×8 IMPLANT
DRAPE C-ARM XRAY 36X54 (DRAPES) IMPLANT
DRAPE COLUMN DVNC XI (DISPOSABLE) ×2 IMPLANT
DRAPE DA VINCI XI ARM (DISPOSABLE) ×4
DRAPE DA VINCI XI COLUMN (DISPOSABLE) ×1
ELECT CAUTERY BLADE 6.4 (BLADE) ×3 IMPLANT
ELECT REM PT RETURN 9FT ADLT (ELECTROSURGICAL) ×3
ELECTRODE REM PT RTRN 9FT ADLT (ELECTROSURGICAL) ×2 IMPLANT
GLOVE BIOGEL PI IND STRL 7.0 (GLOVE) ×4 IMPLANT
GLOVE BIOGEL PI INDICATOR 7.0 (GLOVE) ×2
GLOVE SURG SYN 6.5 ES PF (GLOVE) ×18 IMPLANT
GLOVE SURG SYN 6.5 PF PI (GLOVE) ×4 IMPLANT
GOWN STRL REUS W/ TWL LRG LVL3 (GOWN DISPOSABLE) ×6 IMPLANT
GOWN STRL REUS W/TWL LRG LVL3 (GOWN DISPOSABLE) ×6
GRASPER SUT TROCAR 14GX15 (MISCELLANEOUS) IMPLANT
IRRIGATOR SUCT 8 DISP DVNC XI (IRRIGATION / IRRIGATOR) IMPLANT
IRRIGATOR SUCTION 8MM XI DISP (IRRIGATION / IRRIGATOR)
IV NS 1000ML (IV SOLUTION)
IV NS 1000ML BAXH (IV SOLUTION) IMPLANT
LABEL OR SOLS (LABEL) ×3 IMPLANT
MANIFOLD NEPTUNE II (INSTRUMENTS) ×3 IMPLANT
NDL INSUFFLATION 14GA 120MM (NEEDLE) ×2 IMPLANT
NEEDLE HYPO 22GX1.5 SAFETY (NEEDLE) ×3 IMPLANT
NEEDLE INSUFFLATION 14GA 120MM (NEEDLE) ×3 IMPLANT
NS IRRIG 500ML POUR BTL (IV SOLUTION) ×3 IMPLANT
OBTURATOR OPTICAL STANDARD 8MM (TROCAR) ×1
OBTURATOR OPTICAL STND 8 DVNC (TROCAR) ×2
OBTURATOR OPTICALSTD 8 DVNC (TROCAR) ×2 IMPLANT
PACK LAP CHOLECYSTECTOMY (MISCELLANEOUS) ×3 IMPLANT
PENCIL ELECTRO HAND CTR (MISCELLANEOUS) ×3 IMPLANT
SEAL CANN UNIV 5-8 DVNC XI (MISCELLANEOUS) ×6 IMPLANT
SEAL XI 5MM-8MM UNIVERSAL (MISCELLANEOUS) ×3
SET TUBE SMOKE EVAC HIGH FLOW (TUBING) ×3 IMPLANT
SOLUTION ELECTROLUBE (MISCELLANEOUS) ×3 IMPLANT
SPIKE FLUID TRANSFER (MISCELLANEOUS) ×6 IMPLANT
STAPLER CANNULA SEAL DVNC XI (STAPLE) ×2 IMPLANT
STAPLER CANNULA SEAL XI (STAPLE) ×1
SUT MNCRL 4-0 (SUTURE) ×2
SUT MNCRL 4-0 27XMFL (SUTURE) ×4
SUT VICRYL 0 AB UR-6 (SUTURE) ×3 IMPLANT
SUTURE MNCRL 4-0 27XMF (SUTURE) ×4 IMPLANT
SYR 30ML LL (SYRINGE) IMPLANT
SYSTEM WECK SHIELD CLOSURE (TROCAR) IMPLANT
WATER STERILE IRR 500ML POUR (IV SOLUTION) ×2 IMPLANT

## 2021-12-06 NOTE — Anesthesia Preprocedure Evaluation (Addendum)
Anesthesia Evaluation  ?Patient identified by MRN, date of birth, ID band ?Patient awake ? ? ? ?Reviewed: ?Allergy & Precautions, NPO status , Patient's Chart, lab work & pertinent test results, reviewed documented beta blocker date and time  ? ?Airway ?Mallampati: III ? ?TM Distance: >3 FB ?Neck ROM: Full ? ? ? Dental ? ?(+) Edentulous Upper, Edentulous Lower ?  ?Pulmonary ?shortness of breath and with exertion, COPD,  COPD inhaler, Current Smoker and Patient abstained from smoking.,  ?  ?Pulmonary exam normal ? ?+ decreased breath sounds ? ? ? ? ? Cardiovascular ?Exercise Tolerance: Good ?METS: 3 - Mets hypertension, Pt. on medications and Pt. on home beta blockers ?+ CAD (Insignificant coronary artery disease on cath 2018)  ?Past MI: 2018 s/p revascularization. Cardiac stents: 2018.  ? ?Rhythm:Irregular Rate:Normal ?- Systolic murmurs ?H/o pericarditis 2018 ? ?Left radial and brachial pulse < Right side ?  ?Neuro/Psych ?PSYCHIATRIC DISORDERS Anxiety negative neurological ROS ?   ? GI/Hepatic ?Neg liver ROS, GERD  Medicated,Choledocholithiasis s/p ERCP ?  ?Endo/Other  ?negative endocrine ROS ? Renal/GU ?negative Renal ROS  ?negative genitourinary ?  ?Musculoskeletal ? ?(+) Arthritis , Rheumatoid disorders,   ? Abdominal ?Normal abdominal exam  (+)   ?Peds ?negative pediatric ROS ?(+)  Hematology ?negative hematology ROS ?(+)   ?Anesthesia Other Findings ?Past Medical History: ?No date: Arthritis ?    Comment:  RA ?No date: Hypertension ? ?Past Surgical History: ?05/28/2017: CORONARY/GRAFT ACUTE MI REVASCULARIZATION; N/A ?    Comment:  Procedure: Coronary/Graft Acute MI Revascularization;   ?             Surgeon: Isaias Cowman, MD;  Location: [facility]  ?             INVASIVE CV LAB;  Service: Cardiovascular;  Laterality:  ?             N/A; ?05/28/2017: LEFT HEART CATH AND CORONARY ANGIOGRAPHY; N/A ?    Comment:  Procedure: LEFT HEART CATH AND CORONARY ANGIOGRAPHY;   ?              Surgeon: Isaias Cowman, MD;  Location: [facility]  ?             INVASIVE CV LAB;  Service: Cardiovascular;  Laterality:  ?             N/A; ?07/26/2020: ORIF ELBOW FRACTURE; Right ?    Comment:  Procedure: OPEN REDUCTION INTERNAL FIXATION (ORIF)  ?             ELBOW/OLECRANON FRACTURE;  Surgeon: Shona Needles, MD;  ?             Location: [facility];  Service: Orthopedics;  Laterality:  ?             Right; ?07/29/2020: REVERSE SHOULDER ARTHROPLASTY; Left ?    Comment:  Procedure: REVERSE SHOULDER ARTHROPLASTY;  Surgeon:  ?             Hiram Gash, MD;  Location: [facility];  Service:  ?             Orthopedics;  Laterality: Left; ?No date: THYROID LOBECTOMY ? ?BMI   ? Body Mass Index: 22.11 kg/m?  ?  ? ? Reproductive/Obstetrics ?negative OB ROS ? ?  ? ? ? ? ? ? ? ? ? ? ? ? ? ?  ?  ? ? ? ? ? ? ?Anesthesia Physical ? ?Anesthesia Plan ? ?ASA: 3 ? ?Anesthesia Plan: General  ? ?  Post-op Pain Management: Ofirmev IV (intra-op)* and Regional block*  ? ?Induction: Intravenous ? ?PONV Risk Score and Plan: Dexamethasone and Ondansetron ? ?Airway Management Planned: Oral ETT ? ?Additional Equipment:  ? ?Intra-op Plan:  ? ?Post-operative Plan: Extubation in OR ? ?Informed Consent: I have reviewed the patients History and Physical, chart, labs and discussed the procedure including the risks, benefits and alternatives for the proposed anesthesia with the patient or authorized representative who has indicated his/her understanding and acceptance.  ? ? ? ?Dental advisory given ? ?Plan Discussed with: CRNA and Surgeon ? ?Anesthesia Plan Comments:   ? ? ? ? ?Anesthesia Quick Evaluation ? ?

## 2021-12-06 NOTE — Progress Notes (Signed)
Pharmacy Antibiotic Note ? ?Kathleen Blankenship is a 71 y.o. female admitted on 12/06/2021 with  intraabdominal infection .  Pharmacy has been consulted for Zosyn dosing. ? ?Renal function stable. WBC slightly elevated. ? ?Plan: ?Zosyn 3.375g IV q8h (4 hour infusion). ? ?Monitor renal function, course, length of therapy. ? ?Height: '5\' 1"'$  (154.9 cm) ?Weight: 54.4 kg (120 lb) ?IBW/kg (Calculated) : 47.8 ? ?Temp (24hrs), Avg:98.2 ?F (36.8 ?C), Min:98.2 ?F (36.8 ?C), Max:98.2 ?F (36.8 ?C) ? ?Recent Labs  ?Lab 12/04/21 ?1118  ?CREATININE 0.57  ?  ?Estimated Creatinine Clearance: 50.1 mL/min (by C-G formula based on SCr of 0.57 mg/dL).   ? ?Allergies  ?Allergen Reactions  ? Codeine Hives  ? Methotrexate Nausea Only  ? Remicade [Infliximab] Other (See Comments)  ?  Chest felt heavy, was given benadryl.  ? ? ?Antimicrobials this admission: ?4/13 cefazolin >> 4/13 ?4/13 Zosyn >> 4/13 ? ?Dose adjustments this admission: ?N/a ? ?Microbiology results: ?4/13 BCx: in process ?4/13 Trach aspirate: in process ? ?Thank you for allowing pharmacy to be a part of this patient?s care. ? ?Wynelle Cleveland, PharmD ?Pharmacy Resident  ?12/06/2021 ?1:00 PM ? ? ?

## 2021-12-06 NOTE — Anesthesia Procedure Notes (Signed)
Procedure Name: Intubation ?Date/Time: 12/06/2021 9:47 AM ?Performed by: Hedda Slade, CRNA ?Pre-anesthesia Checklist: Patient identified, Patient being monitored, Timeout performed, Emergency Drugs available and Suction available ?Patient Re-evaluated:Patient Re-evaluated prior to induction ?Oxygen Delivery Method: Circle system utilized ?Preoxygenation: Pre-oxygenation with 100% oxygen ?Induction Type: IV induction ?Ventilation: Mask ventilation without difficulty ?Laryngoscope Size: 3 and McGraph ?Grade View: Grade I ?Tube type: Oral ?Tube size: 6.5 mm ?Number of attempts: 1 ?Airway Equipment and Method: Stylet ?Placement Confirmation: ETT inserted through vocal cords under direct vision, positive ETCO2 and breath sounds checked- equal and bilateral ?Secured at: 21 cm ?Tube secured with: Tape ?Dental Injury: Teeth and Oropharynx as per pre-operative assessment  ? ? ? ? ?

## 2021-12-06 NOTE — ED Provider Notes (Signed)
[facility] ?Department of Emergency Medicine ? ? ?Code Blue CONSULT NOTE ? ?Chief Complaint: Cardiac arrest/unresponsive  ? ?Level V Caveat: Unresponsive ? ?History of present illness: I was contacted by the hospital for a CODE BLUE cardiac arrest upstairs and presented to the patient's bedside.  ? ?Anesthesia, general surgery and OB/GYN physicians are all at the bedside and pulses have already resumed without my need for intervention.  No additional help is needed. ? ?ROS: Unable to obtain, Level V caveat ? ?Scheduled Meds: ? Chlorhexidine Gluconate Cloth  6 each Topical Once  ? ?Continuous Infusions: ? lactated ringers 10 mL/hr at 12/06/21 0932  ? ?PRN Meds:.Marcaine 0.5% 30 mL with lidocaine 1% w/EPINEPHrine 1:200,000 30 mL injection ?Past Medical History:  ?Diagnosis Date  ? Anginal pain ([place])   ? Anxiety   ? Arthritis   ? RA  ? Coronary artery disease   ? GERD (gastroesophageal reflux disease)   ? Hypertension   ? Pneumonia   ? ?Past Surgical History:  ?Procedure Laterality Date  ? CARDIAC CATHETERIZATION    ? CORONARY/GRAFT ACUTE MI REVASCULARIZATION N/A 05/28/2017  ? Procedure: Coronary/Graft Acute MI Revascularization;  Surgeon: Isaias Cowman, MD;  Location: [place] [facility];  Service: Cardiovascular;  Laterality: N/A;  ? ERCP N/A 11/22/2021  ? Procedure: ENDOSCOPIC RETROGRADE CHOLANGIOPANCREATOGRAPHY (ERCP);  Surgeon: Lucilla Lame, MD;  Location: [facility] ENDOS[facility];  Service: Endoscopy;  Laterality: N/A;  ? LEFT HEART CATH AND CORONARY ANGIOGRAPHY N/A 05/28/2017  ? Procedure: LEFT HEART CATH AND CORONARY ANGIOGRAPHY;  Surgeon: Isaias Cowman, MD;  Location: [place] [facility];  Service: Cardiovascular;  Laterality: N/A;  ? ORIF ELBOW FRACTURE Right 07/26/2020  ? Procedure: OPEN REDUCTION INTERNAL FIXATION (ORIF) ELBOW/OLECRANON FRACTURE;  Surgeon: Shona Needles, MD;  Location: [place];  Service: Orthopedics;  Laterality: Right;  ? REVERSE SHOULDER ARTHROPLASTY Left  07/29/2020  ? Procedure: REVERSE SHOULDER ARTHROPLASTY;  Surgeon: Hiram Gash, MD;  Location: [place];  Service: Orthopedics;  Laterality: Left;  ? THYROID LOBECTOMY    ? ?Social History  ? ?Socioeconomic History  ? Marital status: Divorced  ?  Spouse name: Not on file  ? Number of children: Not on file  ? Years of education: Not on file  ? Highest education level: Not on file  ?Occupational History  ? Not on file  ?Tobacco Use  ? Smoking status: Every Day  ?  Packs/day: 1.00  ?  Years: 37.00  ?  Pack years: 37.00  ?  Types: Cigarettes  ? Smokeless tobacco: Never  ?Vaping Use  ? Vaping Use: Not on file  ?Substance and Sexual Activity  ? Alcohol use: No  ? Drug use: No  ? Sexual activity: Never  ?Other Topics Concern  ? Not on file  ?Social History Narrative  ? Not on file  ? ?Social Determinants of Health  ? ?Financial Resource Strain: Not on file  ?Food Insecurity: Not on file  ?Transportation Needs: Not on file  ?Physical Activity: Not on file  ?Stress: Not on file  ?Social Connections: Not on file  ?Intimate Partner Violence: Not on file  ? ?Allergies  ?Allergen Reactions  ? Codeine Hives  ? Methotrexate Nausea Only  ? Remicade [Infliximab] Other (See Comments)  ?  Chest felt heavy, was given benadryl.  ? ? ?Last set of Vital Signs (not current) ?Vitals:  ? 12/06/21 0834  ?BP: (!) 160/105  ?Pulse: 69  ?Temp: 98.2 ?F (36.8 ?C)  ?SpO2: 97%  ? ?  ? ?  Physical Exam ? ?Gen: unresponsive, intubated on the OR table ?Cardiovascular: pulse present ?Resp: apneic. Breath sounds equal bilaterally with bagging  ?Abd: nondistended  ?Neuro: GCS 3, unresponsive to pain  ?HEENT: No blood in posterior pharynx, gag reflex absent  ?Neck: No crepitus  ?Musculoskeletal: No deformity  ?Skin: warm ? ?Procedures (when applicable, including Critical Care time): ?Procedures ? ? ?MDM / Assessment and Plan ?Plan of care per primary provider ? ?  ?Vladimir Crofts, MD ?12/06/21 1047 ? ?

## 2021-12-06 NOTE — Interval H&P Note (Signed)
History and Physical Interval Note: ? ?12/06/2021 ?9:12 AM ? ?Kathleen Blankenship  has presented today for surgery, with the diagnosis of Choledocholithiasis K80.50.  The various methods of treatment have been discussed with the patient and family. After consideration of risks, benefits and other options for treatment, the patient has consented to  Procedure(s): ?XI ROBOTIC ASSISTED LAPAROSCOPIC CHOLECYSTECTOMY (N/A) ?INDOCYANINE GREEN FLUORESCENCE IMAGING (ICG) (N/A) as a surgical intervention.  The patient's history has been reviewed, patient examined, no change in status, stable for surgery.  I have reviewed the patient's chart and labs.  Questions were answered to the patient's satisfaction.   ? ? ?Kimberla Driskill Lysle Pearl ? ? ?

## 2021-12-06 NOTE — Op Note (Addendum)
Preoperative diagnosis: Choledocholithiasis ? ?Postoperative diagnosis: same as above, PEA, ROSC ? ?Procedure: Robotic assisted Laparoscopic Cholecystectomy.  ? ?Anesthesia: GETA ?  ?Surgeon: Benjamine Sprague ? ?Specimen: Gallbladder ? ?Complications: PEA noted by anesthesia towards end of procedure.  Procedure terminated prior to her skin and fascial closure at 12 mm port site to initiate CPR. ? ?EBL: 9m ? ?Wound Classification: Contaminated ? ?Indications: see HPI ? ?Findings: ?Critical view of safety noted ?Cystic duct and artery identified, ligated and divided, clips remained intact at end of procedure ?Adequate hemostasis ? ?Description of procedure:  ?The patient was placed on the operating table in the supine position. SCDs placed, pre-op abx administered.  General anesthesia was induced and OG tube placed by anesthesia. A time-out was completed verifying correct patient, procedure, site, positioning, and implant(s) and/or special equipment prior to beginning this procedure. The abdomen was prepped and draped in the usual sterile fashion.  ?  ?Veress needle was placed at the Palmer's point and insufflation was started after confirming a positive saline drop test and no immediate increase in abdominal pressure.  After reaching 15 mm, the Veress needle was removed and a 8 mm port was placed via optiview technique under umbilicus measured 254YTfrom gallbladder.  The abdomen was inspected and no abnormalities or injuries were found.  Under direct vision, ports were placed in the following locations: One 12 mm patient left of the umbilicus, 8cm from the optiviewed port, one 8 mm port placed to the patient right of the umbilical port 8 cm apart.  1 additional 8 mm port placed lateral to the 166mport.  Once ports were placed, The table was placed in the reverse Trendelenburg position with the right side up. The Xi platform was brought into the operative field and docked to the ports successfully.  An endoscope was  placed through the umbilical port, fenestrated grasper through the adjacent patient right port, prograsp to the far patient left port, and then a hook cautery in the left port. ? ?The dome of the gallbladder was grasped with prograsp, passed and retracted over the dome of the liver. Adhesions between the gallbladder and omentum, duodenum and transverse colon were lysed via hook cautery. The infundibulum was grasped with the fenestrated grasper and retracted toward the right lower quadrant. This maneuver exposed Calot?s triangle. The peritoneum overlying the gallbladder infundibulum was then dissected  and the cystic duct and cystic artery identified.  Critical view of safety with the liver bed clearly visible behind the duct and artery with no additional structures noted.  The cystic duct and cystic artery clipped and divided close to the gallbladder.   ?  ?The gallbladder was then dissected from its peritoneal and liver bed attachments by electrocautery. Hemostasis was checked prior to removing the hook cautery and the Endo Catch bag was then placed through the 12 mm port and the gallbladder placed in the bag.  At this point anesthesia notified team of PEA.  Robot was emergently undocked all ports were removed and abdomen desufflated, while CODE BLUE initiated.  Please see anesthesia notes for further details regarding code.  ROSC achieved, and while stabilizing patient for emergent transfer to ICU, the Endo Catch bag was removed through the 12 mm port site and all port sites approximated using Steri-Strips due to emergent need for patient transfer to ICU during this critical timeframe. ? ?Patient then directly transported to ICU in critical condition. Sister notified of events ? ?

## 2021-12-06 NOTE — Progress Notes (Signed)

## 2021-12-06 NOTE — Plan of Care (Signed)

## 2021-12-06 NOTE — Progress Notes (Signed)
PHARMACY CONSULT NOTE  ? ?Pharmacy Consult for Electrolyte Monitoring and Replacement  ? ?Recent Labs: ?Potassium (mmol/L)  ?Date Value  ?12/06/2021 2.7 (LL)  ?09/10/2012 3.7  ? ?Calcium (mg/dL)  ?Date Value  ?12/06/2021 6.5 (L)  ? ?Calcium, Total (mg/dL)  ?Date Value  ?09/10/2012 8.8  ? ?Albumin (g/dL)  ?Date Value  ?12/06/2021 2.2 (L)  ? ?Sodium (mmol/L)  ?Date Value  ?12/06/2021 140  ?09/10/2012 137  ? ?Assessment: ?71 year old female admitted with intraabdominal infection. While in OR for cholecystectomy, suffered brief cardiac arrest. Admitted to ICU, intubated. ? ?Now with SBP > 200, medical team concerned for possible stroke. Started on nicardipine gtt and hydralazine prn. ? ?Goal of Therapy:  ?Electrolytes within normal limits ? ?Plan:  ?Magnesium sulfate 2 grams ordered by medical team ?K 2.7. KCl 10 meq IV x 4 ordered by medical team ?Follow up labs in AM ? ? ?Wynelle Cleveland, PharmD ?Pharmacy Resident  ?12/06/2021 ?1:08 PM ?

## 2021-12-06 NOTE — Progress Notes (Signed)
*  PRELIMINARY RESULTS* ?Echocardiogram ?2D Echocardiogram has been performed. ? ?Kathleen Blankenship ?12/06/2021, 2:21 PM ?

## 2021-12-06 NOTE — Anesthesia Postprocedure Evaluation (Signed)
Anesthesia Post Note ? ?Patient: Kathleen Blankenship ? ?Procedure(s) Performed: XI ROBOTIC ASSISTED LAPAROSCOPIC CHOLECYSTECTOMY (Abdomen) ?INDOCYANINE GREEN FLUORESCENCE IMAGING (ICG) ? ?Patient location during evaluation: PACU ?Anesthesia Type: General ?Level of consciousness: awake and alert ?Pain management: pain level controlled ?Vital Signs Assessment: post-procedure vital signs reviewed and stable ?Respiratory status: spontaneous breathing, nonlabored ventilation and respiratory function stable ?Cardiovascular status: blood pressure returned to baseline and stable ?Postop Assessment: no apparent nausea or vomiting ?Anesthetic complications: yes ? ? ?Encounter Notable Events  ?Notable Event Outcome Phase Comment  ?Cardiac arrest  Intraprocedure PEA near end of cholecystectomy 12/06/21. ROSC with one round of CPR and 1 mg Epinephrine. The IV had infiltrated with infiltration of vasoactive medications to left forearm. Transport to ICU hemodynamically stable with mechanical ventilation.  ? ? ? ?Last Vitals:  ?Vitals:  ? 12/06/21 1900 12/06/21 2000  ?BP:  129/72  ?Pulse: 89 84  ?Resp: (!) 27 17  ?Temp:  36.6 ?C  ?SpO2: 97% 97%  ?  ?Last Pain:  ?Vitals:  ? 12/06/21 2000  ?TempSrc: Oral  ?PainSc: 8   ? ? ?  ?  ?  ?  ?  ?  ? ?Iran Ouch ? ? ? ? ?

## 2021-12-06 NOTE — Progress Notes (Signed)
Pt was suctioned for a small amount of thick white secretions. Per a verbal order from Dr. Lanney Gins, she was extubated and placed on a 2L nasal cannula.  Sa02 is 95%. ?

## 2021-12-06 NOTE — Transfer of Care (Signed)
Immediate Anesthesia Transfer of Care Note ? ?Patient: TEMIMA KUTSCH ? ?Procedure(s) Performed: XI ROBOTIC ASSISTED LAPAROSCOPIC CHOLECYSTECTOMY (Abdomen) ?INDOCYANINE GREEN FLUORESCENCE IMAGING (ICG) ? ?Patient Location: ICU 3 ? ?Anesthesia Type:General ? ?Level of Consciousness: sedated and Patient remains intubated per anesthesia plan ? ?Airway & Oxygen Therapy: Patient remains intubated per anesthesia plan and Patient placed on Ventilator (see vital sign flow sheet for setting) ? ?Post-op Assessment: Report given to RN and Post -op Vital signs reviewed and stable ? ?Post vital signs: Reviewed and stable ? ?Last Vitals:  ?Vitals Value Taken Time  ?BP 209/140 12/06/21 1130  ?Temp    ?Pulse 89 12/06/21 1142  ?Resp 16 12/06/21 1142  ?SpO2 100 % 12/06/21 1142  ?Vitals shown include unvalidated device data. ? ?Last Pain:  ?Vitals:  ? 12/06/21 0834  ?TempSrc: Tympanic  ?PainSc: 0-No pain  ?   ? ?  ? ?Complications: Intraoperative code.  See intraop flowsheet for details.  ?

## 2021-12-06 NOTE — Progress Notes (Signed)
Pt's ETT was advanced  2 cm per CXR and verbal order from NP.  ?

## 2021-12-06 NOTE — Consult Note (Addendum)
? ?NAME:  Kathleen Blankenship, MRN:  [id number], DOB:  February 16, 1951, LOS: 0 ?ADMISSION DATE:  12/06/2021, CONSULTATION DATE:  12/06/2021 ?REFERRING MD:  Dr. Lysle Pearl, CHIEF COMPLAINT:  Cardiac Arrest  ? ?Brief Pt Description / Synopsis:  ?71 y.o. Female with Choledocholithiasis s/p ERCP on 11/22/21, who underwent elective Robotic Assisted Laparoscopic Cholecystectomy on 4/13.  Intra-op suffered brief PEA cardiac arrest (reported <2 minutes of ACLS before ROSC), suspect due to hypotension with loss of IV access.  Returns to ICU post procedure and remains intubated and unresponsive.   ? ?History of Present Illness:  ?Kathleen Blankenship is a 71 year old female with a past medical history as listed below who presented to [facility] on 12/06/2021 for elective Robotic Assisted Laparoscopic Cholecystectomy.   ? ?A few months ago she was following with her PCP due to sharp pain, discomfort and burning of the epigastric area.  MRI revealed a common bile duct stone.  She underwent ERCP on 11/22/2021 by Dr. Verl Blalock.  She presented today for lap chole with general surgery to prevent future issues. ? ?The procedure was complicated by brief cardiac arrest.  Per handoff report from anesthesia, the patient was hypotensive for approximately 10 minutes due to loss of IV access and inability to administer vasopressors during that time.  She then lost a pulse with ROSC obtained after approximately 2 minutes of ACLS. ? ?She was transferred to the ICU and remains unresponsive and intubated.  Anesthesia reports patient did receive Versed and paralytics while in the OR.  PCCM consulted for further management. ? ? ?Pertinent  Medical History  ?Coronary artery disease ?Angina ?Hypertension ?GERD ?Anxiety ?Arthritis ?Tobacco abuse (smoking average of 1 pack/day) ? ?Micro Data:  ?4/13: Blood culture x2>> ?4/13: Tracheal aspirate>> ?4/13: Urine>> ? ?Antimicrobials:  ?Zosyn 4/13>> ? ?Significant Hospital Events: ?Including procedures, antibiotic start and stop dates in  addition to other pertinent events   ?4/13: Underwent elective Robotic Assisted Laparoscopic Cholecystectomy.  Intra-op suffered brief cardiac arrest, suspect due to hypotension with loss of IV access.  Returns to ICU post procedure and remains intubated, unresponsive.  PCCM consulted. ? ?Interim History / Subjective:  ?-Pt suffered brief cardiac arrest in OR ?-Returns to ICU and remains intubated and unresponsive (did receive versed and paralytic in OR), pupils are PERRL 3 mm brisk bilaterally ?-Pt now hypertensive, with SBP >200 (concern for possible stroke) ~ will obtain CTH ?-CT Head negative for acute process ~ pt now is awake and following commands ~ hopeful for extubation ? ?Objective   ?Blood pressure 101/68, pulse 76, temperature 98.2 ?F (36.8 ?C), temperature source Tympanic, resp. rate 16, height 5' 1"  (1.549 m), weight 54.4 kg, SpO2 100 %. ?   ?   ? ?Intake/Output Summary (Last 24 hours) at 12/06/2021 1123 ?Last data filed at 12/06/2021 1053 ?Gross per 24 hour  ?Intake 800 ml  ?Output --  ?Net 800 ml  ? ?Filed Weights  ? 12/06/21 0834  ?Weight: 54.4 kg  ? ? ?Examination: ?General: Critically ill-appearing female, laying in bed, intubated, unresponsive, no acute distress ?HENT: Atraumatic, normocephalic, neck supple, no JVD, orally intubated ?Lungs: Coarse breath sounds bilaterally, even, nonlabored, synchronous with the vent ?Cardiovascular: Tachycardia, regular rhythm, S1-S2, no murmurs, rubs, gallops ?Abdomen: Limited exam due to unresponsiveness and intubation ?Extremities: Left hand swollen and cool (IV was located in left hand with concern for extravasation of epinephrine and vasopressin) ?Neuro: Unresponsive (received Versed and paralytics in OR), pupils PERRLA at 3 mm bilaterally and brisk ?GU: Foley catheter being placed draining  yellow urine ? ?Resolved Hospital Problem list   ? ? ?Assessment & Plan:  ? ?Brief PEA Cardiac Arrest, suspect due to hypotension with loss of IV access  intra-op ?Hypertension ?PMHx: Hypertension, CAD, Angina ?-Serial EKG's ?-Continuous cardiac monitoring ?-Maintain MAP >65 ?-Prn Labetalol, Hydralazine, and Nicardipine infusion if needed to maintain SBP 160-180 ?-Trend lactic acid until normalized (1.1 ~ ) ?-Trend HS Troponin until peaked (11 ~ ) ?-Obtain Echocardiogram ? ?Acute Hypercapnic Respiratory Failure in setting of Cardiac Arrest ?? Aspiration vs Atelectasis on Chest X-ray ?-Full vent support, implement lung protective strategies ?-Plateau pressures less than 30 cm H20 ?-Wean FiO2 & PEEP as tolerated to maintain O2 sats >92% ?-Follow intermittent Chest X-ray & ABG as needed ?-Spontaneous Breathing Trials when respiratory parameters met and mental status permits ?-Implement VAP Bundle ?-Prn Bronchodilators ?-ABX as above ? ?Choledocholithiasis s/p Robotic Assisted Laparoscopic Cholecystectomy on 4/13 ?-S/p ERCP on 3/30 with Dr. Allen Norris ?-NPO ?-NGT to LIS ?-General Surgery following, appreciate input ?-Discussed with Dr. Lysle Pearl, due to Cardiac arrest, had to emergently remove gallbladder and incision sites covered with steri strips ~ recommends ABX for intra-abdominal coverage for now ?-Repeat CT abdomen/pelvis with "mild dilatation of common duct with 5 mm hyper attenuating stone, along with  Prominent circumferential wall thickening distal rectum into the anus. This could be infectious/inflammatory or related to neoplasm." ?-GI consulted, appreciate input ? ?Leukocytosis, ? reactive vs ? Aspiration vs ? Intra-abdominal process ?-Monitor fever curve ?-Trend WBC's & Procalcitonin ?-Follow cultures as above ?-Start empiric Zosyn pending cultures & sensitivities ?-Discussed with Dr. Lanney Gins, will obtain CT Abdomen & Pelvis ? ?Acute Metabolic Encephalopathy in setting of Cardiac Arrest ?Sedation needs in setting of mechanical ventilation ?PMHx: Anxiety ?-Maintain a RASS goal of 0 to -1 ?-Prn Fentanyl and Versed to maintain RASS goal ?-Avoid sedating medications as  able ?-Daily wake up assessment ?-Obtain CT Head ~ negative for acute intracranial abnormality ?-UDS + for Benzodiazepines ? ?Hypokalemia ?-Monitor I&O's / urinary output ?-Follow BMP ?-Ensure adequate renal perfusion ?-Avoid nephrotoxic agents as able ?-Replace electrolytes as indicated ?-Pharmacy consulted for assistance with electrolyte replacement ? ? ? ? ?Best Practice (right click and "Reselect all SmartList Selections" daily)  ? ?Diet/type: NPO ?DVT prophylaxis: SCD ?GI prophylaxis: PPI ?Lines: Arterial Line and yes and it is still needed ?Foley:  Yes, and it is still needed ?Code Status:  full code ?Last date of multidisciplinary goals of care discussion [N/A] ? ?Updated patient's son and patient's sister at bedside 4/13.  All questions answered to their satisfaction. ? ?Labs   ?CBC: ?No results for input(s): WBC, NEUTROABS, HGB, HCT, MCV, PLT in the last 168 hours. ? ?Basic Metabolic Panel: ?Recent Labs  ?Lab 12/04/21 ?1118  ?NA 137  ?K 3.5  ?CL 102  ?CO2 25  ?GLUCOSE 101*  ?BUN 8  ?CREATININE 0.57  ?CALCIUM 8.9  ? ?GFR: ?Estimated Creatinine Clearance: 50.1 mL/min (by C-G formula based on SCr of 0.57 mg/dL). ?No results for input(s): PROCALCITON, WBC, LATICACIDVEN in the last 168 hours. ? ?Liver Function Tests: ?No results for input(s): AST, ALT, ALKPHOS, BILITOT, PROT, ALBUMIN in the last 168 hours. ?No results for input(s): LIPASE, AMYLASE in the last 168 hours. ?No results for input(s): AMMONIA in the last 168 hours. ? ?ABG ?   ?Component Value Date/Time  ? PHART 7.29 (L) 12/06/2021 1051  ? PCO2ART 57 (H) 12/06/2021 1051  ? PO2ART 452 (H) 12/06/2021 1051  ? HCO3 27.4 12/06/2021 1051  ? ACIDBASEDEF 0.3 12/06/2021 1051  ? O2SAT  98.5 12/06/2021 1051  ?  ? ?Coagulation Profile: ?No results for input(s): INR, PROTIME in the last 168 hours. ? ?Cardiac Enzymes: ?No results for input(s): CKTOTAL, CKMB, CKMBINDEX, TROPONINI in the last 168 hours. ? ?HbA1C: ?No results found for: HGBA1C ? ?CBG: ?Recent Labs  ?Lab  12/06/21 ?1109  ?GLUCAP 147*  ? ? ?Review of Systems:   ?Unable to assess due to intubation/sedation, AMS, and critical illness ? ? ?Past Medical History:  ?She,  has a past medical history of Anginal pain ([place]), Anxiety,

## 2021-12-06 NOTE — Progress Notes (Signed)
Pt came up from OR post code blue with Left hand infiltrated from pressors, Epi and Marcaine, per OR CRNA. Followed extravasation protocol, warm compress, nitro, elevation and Phentolamine as well as frequent assessments . PT complaining of an 8 out of 10 pain from extravasation site, notified MD and tramadol order was put in.  Pt extubated on 2L [place], Aox4, completed a bedside swallow eval and pt passed. Will continue to monitor. ?

## 2021-12-06 NOTE — Anesthesia Procedure Notes (Signed)
Arterial Line Insertion ?Start/End4/13/2023 10:37 AM, 12/06/2021 10:37 AM ?Performed by: Iran Ouch, MD, anesthesiologist ? Right, radial was placed ? ?Procedure performed using ultrasound guided technique. ? ? ?

## 2021-12-06 NOTE — Progress Notes (Signed)
Pt is awake and alert, following all commands, nodding appropriately to questions.  Has been in PSV 5/5 for over 30 minutes. Vital signs stable, Rapid Shallow Breathing Index Score 40-60 indicative of successful weaning.   ? ?Dr. Lanney Gins at bedside, in agreement with extubation.  Discussed with nursing and RT.  Pt's family updated at bedside. ? ? ? ? ? ?Darel Hong, AGACNP-BC ? Pulmonary & Critical Care ?Prefer epic messenger for cross cover needs ?If after hours, please call E-link ? ?

## 2021-12-06 NOTE — H&P (Addendum)
Subjective:  ? ?CC: PEA ? ?HPI: ? Kathleen Blankenship is a 71 y.o. female who was admitted for above.  Noted to be in PEA at end of elective, otherwise uneventful, robotic lap chole case.  ROSC achieved and transferred to ICU for further monitoring, intubated and sedated. ?  ?Past Medical History:  has a past medical history of Anginal pain ([place]), Anxiety, Arthritis, Coronary artery disease, GERD (gastroesophageal reflux disease), Hypertension, and Pneumonia. ? ?Past Surgical History:  has a past surgical history that includes Thyroid lobectomy; LEFT HEART CATH AND CORONARY ANGIOGRAPHY (N/A, 05/28/2017); Coronary/Graft Acute MI Revascularization (N/A, 05/28/2017); ORIF elbow fracture (Right, 07/26/2020); Reverse shoulder arthroplasty (Left, 07/29/2020); ERCP (N/A, 11/22/2021); and Cardiac catheterization. ? ?Family History: Family history is unknown by patient. ? ?Social History:  reports that she has been smoking cigarettes. She has a 37.00 pack-year smoking history. She has never used smokeless tobacco. She reports that she does not drink alcohol and does not use drugs. ? ?Current Medications:  ?Prior to Admission medications   ?Medication Sig Start Date End Date Taking? Authorizing Provider  ?acetaminophen (TYLENOL) 650 MG CR tablet Take 1,300 mg by mouth every 8 (eight) hours as needed for pain.   Yes [provider]  ?albuterol (PROVENTIL HFA;VENTOLIN HFA) 108 (90 Base) MCG/ACT inhaler Inhale 2 puffs into the lungs every 6 (six) hours as needed for wheezing or shortness of breath. 05/29/17  Yes Epifanio Lesches, MD  ?alendronate (FOSAMAX) 70 MG tablet Take 70 mg by mouth once a week. Sunday 09/27/21  Yes [provider]  ?atorvastatin (LIPITOR) 10 MG tablet Take 10 mg by mouth daily. 06/26/20  Yes [provider]  ?Cholecalciferol (VITAMIN D) 125 MCG (5000 UT) CAPS Take 5,000 Units by mouth daily.   Yes [provider]  ?leflunomide (ARAVA) 20 MG tablet Take 20 mg by mouth daily.    Yes [provider]  ?losartan (COZAAR) 50 MG tablet Take 50 mg by mouth daily.   Yes [provider]  ?metoprolol succinate (TOPROL-XL) 50 MG 24 hr tablet Take 50 mg by mouth daily. 05/08/20  Yes [provider]  ?pantoprazole (PROTONIX) 40 MG tablet Take 40 mg by mouth daily. 09/10/21  Yes [provider]  ?sertraline (ZOLOFT) 50 MG tablet Take 50 mg by mouth at bedtime.   Yes [provider]  ?traMADol (ULTRAM) 50 MG tablet Take 50 mg by mouth every 12 (twelve) hours as needed for pain. 08/06/21  Yes [provider]  ?Upadacitinib ER (RINVOQ) 15 MG TB24 Take 15 mg by mouth daily.   Yes [provider]  ? ? ?Allergies:  ?Allergies  ?Allergen Reactions  ? Codeine Hives  ? Methotrexate Nausea Only  ? Remicade [Infliximab] Other (See Comments)  ?  Chest felt heavy, was given benadryl.  ? ? ?ROS:  ?Unable to obtain at time of admission due to critical status. ?  ?Objective:  ?  ? ?BP (!) 191/97   Pulse 76   Temp 98.3 ?F (36.8 ?C) (Oral)   Resp 16   Ht '5\' 1"'$  (1.549 m)   Wt 54.4 kg   SpO2 100%   BMI 22.67 kg/m?  ? ?Constitutional :  sedated  ?Lymphatics/Throat:  no asymmetry, masses, or scars  ?Respiratory:  clear to auscultation bilaterally  ?Cardiovascular:  regular rate and rhythm  ?Gastrointestinal: soft, non-tender; bowel sounds normal; no masses,  no organomegaly.  ?   ?Skin: Cool and moist, surgical scars covered with steristrips  ?   ?   ?  ?  LABS:  ? ?  Latest Ref Rng & Units 12/06/2021  ? 11:19 AM 12/04/2021  ? 11:18 AM 07/29/2020  ?  2:58 AM  ?CMP  ?Glucose 70 - 99 mg/dL 137   101   107    ?BUN 8 - 23 mg/dL '8   8   9    '$ ?Creatinine 0.44 - 1.00 mg/dL 0.51   0.57   0.89    ?Sodium 135 - 145 mmol/L 140   137   139    ?Potassium 3.5 - 5.1 mmol/L 2.7   3.5   3.6    ?Chloride 98 - 111 mmol/L 114   102   103    ?CO2 22 - 32 mmol/L '21   25   25    '$ ?Calcium 8.9 - 10.3 mg/dL 6.5   8.9   8.5    ?Total Protein 6.5 - 8.1 g/dL 4.5      ?Total Bilirubin 0.3 -  1.2 mg/dL 0.6      ?Alkaline Phos 38 - 126 U/L 120      ?AST 15 - 41 U/L 26      ?ALT 0 - 44 U/L 18      ? ? ?  Latest Ref Rng & Units 12/06/2021  ? 11:19 AM 07/30/2020  ?  2:32 AM 07/29/2020  ?  2:58 AM  ?CBC  ?WBC 4.0 - 10.5 K/uL 13.3   8.3   6.5    ?Hemoglobin 12.0 - 15.0 g/dL 10.7   8.9   9.6    ?Hematocrit 36.0 - 46.0 % 32.9   25.9   27.7    ?Platelets 150 - 400 K/uL 392   211   204    ? ? ?RADS: ?CLINICAL DATA:  Neuro deficit. ?  ?EXAM: ?CT HEAD WITHOUT CONTRAST ?  ?TECHNIQUE: ?Contiguous axial images were obtained from the base of the skull ?through the vertex without intravenous contrast. ?  ?RADIATION DOSE REDUCTION: This exam was performed according to the ?departmental dose-optimization program which includes automated ?exposure control, adjustment of the mA and/or kV according to ?patient size and/or use of iterative reconstruction technique. ?  ?COMPARISON:  07/25/2020 ?  ?FINDINGS: ?Brain: There is no evidence for acute hemorrhage, hydrocephalus, ?mass lesion, or abnormal extra-axial fluid collection. No definite ?CT evidence for acute infarction. Patchy low attenuation in the deep ?hemispheric and periventricular white matter is nonspecific, but ?likely reflects chronic microvascular ischemic demyelination. ?  ?Vascular: No hyperdense vessel or unexpected calcification. ?  ?Skull: No evidence for fracture. No worrisome lytic or sclerotic ?lesion. ?  ?Sinuses/Orbits: The visualized paranasal sinuses and mastoid air ?cells are clear. Visualized portions of the globes and intraorbital ?fat are unremarkable. ?  ?Other: None. ?  ?IMPRESSION: ?No acute intracranial abnormality. ?  ?  ?Electronically Signed ?  By: Misty Stanley M.D. ?  On: 12/06/2021 14:18 ? ?CLINICAL DATA:  Sepsis and weight loss. Choledocholithiasis, status ?post ERCP on 11/22/2021. Status post laparoscopic cholecystectomy ?12/06/2021. Intraoperative cardiac arrest. ?  ?EXAM: ?CT CHEST, ABDOMEN, AND PELVIS WITH CONTRAST ?   ?TECHNIQUE: ?Multidetector CT imaging of the chest, abdomen and pelvis was ?performed following the standard protocol during bolus ?administration of intravenous contrast. ?  ?RADIATION DOSE REDUCTION: This exam was performed according to the ?departmental dose-optimization program which includes automated ?exposure control, adjustment of the mA and/or kV according to ?patient size and/or use of iterative reconstruction technique. ?  ?CONTRAST:  151m OMNIPAQUE IOHEXOL 300 MG/ML  SOLN ?  ?COMPARISON:  MRI abdomen 11/02/2021. Lung cancer screening chest CT ?01/11/2020. ?  ?FINDINGS: ?CT CHEST FINDINGS ?  ?Cardiovascular: The heart size is normal. No substantial pericardial ?effusion. Coronary artery calcification is evident. Mild ?atherosclerotic calcification is noted in the wall of the thoracic ?aorta. Endotracheal tube tip is in the midtrachea. NG tube ?visualized in the esophagus. ?  ?Mediastinum/Nodes: Possible right hemithyroidectomy. Left thyroid ?lobe is enlarged and heterogeneous with numerous tiny nodules ?evident.No mediastinal lymphadenopathy. There is no hilar ?lymphadenopathy. The esophagus has normal imaging features. There is ?no axillary lymphadenopathy. ?  ?Lungs/Pleura: Right lower lobe pulmonary nodule measuring 7 mm on ?01/15/4 is stable since 2021 consistent with benign etiology. No ?followup recommended. Left base collapse/consolidation evident ?without pleural effusion. No evidence for pneumothorax. ?  ?Musculoskeletal: Multiple bilateral anterior rib fractures ?compatible with the reported history of CPR. ?  ?CT ABDOMEN PELVIS FINDINGS ?  ?Hepatobiliary: No suspicious focal abnormality within the liver ?parenchyma. Mild periportal edema evident. Gallbladder surgically ?absent with trace fluid in the gallbladder fossa, not unexpected ?given surgery earlier today. Common bile duct measures up to 7 mm ?diameter with 5 mm hyperattenuating focus projecting in the ?dependent aspect of the distal  common bile duct lumen, highly ?suspicious for choledocholithiasis (image 70/series 2 and coronal ?image 47 of series 5). ?  ?Pancreas: No focal mass lesion. No dilatation of the main duct. No ?intraparenchymal cyst. No peripan

## 2021-12-07 ENCOUNTER — Inpatient Hospital Stay: Payer: Medicare HMO

## 2021-12-07 DIAGNOSIS — K8050 Calculus of bile duct without cholangitis or cholecystitis without obstruction: Secondary | ICD-10-CM

## 2021-12-07 LAB — BASIC METABOLIC PANEL
Anion gap: 8 (ref 5–15)
BUN: 12 mg/dL (ref 8–23)
CO2: 25 mmol/L (ref 22–32)
Calcium: 8.3 mg/dL — ABNORMAL LOW (ref 8.9–10.3)
Chloride: 101 mmol/L (ref 98–111)
Creatinine, Ser: 0.58 mg/dL (ref 0.44–1.00)
GFR, Estimated: >60 mL/min (ref >60)
Glucose, Bld: 121 mg/dL — ABNORMAL HIGH (ref 70–99)
Potassium: 4.6 mmol/L (ref 3.5–5.1)
Sodium: 134 mmol/L — ABNORMAL LOW (ref 135–145)

## 2021-12-07 LAB — SURGICAL PATHOLOGY

## 2021-12-07 LAB — CBC
HCT: 36.5 % (ref 36.0–46.0)
Hemoglobin: 12 g/dL (ref 12.0–15.0)
MCH: 29.6 pg (ref 26.0–34.0)
MCHC: 32.9 g/dL (ref 30.0–36.0)
MCV: 89.9 fL (ref 80.0–100.0)
Platelets: 372 10*3/uL (ref 150–400)
RBC: 4.06 MIL/uL (ref 3.87–5.11)
RDW: 17.1 % — ABNORMAL HIGH (ref 11.5–15.5)
WBC: 10.5 10*3/uL (ref 4.0–10.5)
nRBC: 0 % (ref 0.0–0.2)

## 2021-12-07 LAB — PHOSPHORUS: Phosphorus: 4 mg/dL (ref 2.5–4.6)

## 2021-12-07 LAB — HEPATIC FUNCTION PANEL
ALT: 21 U/L (ref 0–44)
AST: 33 U/L (ref 15–41)
Albumin: 2.8 g/dL — ABNORMAL LOW (ref 3.5–5.0)
Alkaline Phosphatase: 143 U/L — ABNORMAL HIGH (ref 38–126)
Bilirubin, Direct: 0.2 mg/dL (ref 0.0–0.2)
Indirect Bilirubin: 0.7 mg/dL (ref 0.3–0.9)
Total Bilirubin: 0.9 mg/dL (ref 0.3–1.2)
Total Protein: 5.6 g/dL — ABNORMAL LOW (ref 6.5–8.1)

## 2021-12-07 LAB — MAGNESIUM: Magnesium: 2.5 mg/dL — ABNORMAL HIGH (ref 1.7–2.4)

## 2021-12-07 LAB — PROCALCITONIN: Procalcitonin: <0.1 ng/mL

## 2021-12-07 MED ORDER — TRAMADOL HCL 50 MG PO TABS: 50.0000 mg | ORAL_TABLET | Freq: Two times a day (BID) | ORAL | Status: DC | PRN

## 2021-12-07 MED ORDER — DOCUSATE SODIUM 100 MG PO CAPS: 100.0000 mg | ORAL_CAPSULE | Freq: Two times a day (BID) | ORAL | Status: DC

## 2021-12-07 MED ORDER — AMOXICILLIN-POT CLAVULANATE 875-125 MG PO TABS
1.0000 | ORAL_TABLET | Freq: Two times a day (BID) | ORAL | Status: DC
  Administered 2021-12-07: 1 via ORAL
  Filled 2021-12-07: qty 1

## 2021-12-07 MED ORDER — PANTOPRAZOLE SODIUM 40 MG PO TBEC
40.0000 mg | DELAYED_RELEASE_TABLET | Freq: Every day | ORAL | Status: DC
Start: 2021-12-08 — End: 2021-12-07

## 2021-12-07 MED ORDER — TRAMADOL HCL 50 MG PO TABS: 50.0000 mg | ORAL_TABLET | Freq: Four times a day (QID) | ORAL | 0 refills | Status: AC | PRN

## 2021-12-07 MED ORDER — ACETAMINOPHEN 325 MG PO TABS: 650.0000 mg | ORAL_TABLET | Freq: Three times a day (TID) | ORAL | 0 refills | Status: AC | PRN

## 2021-12-07 MED ORDER — AMOXICILLIN-POT CLAVULANATE 875-125 MG PO TABS: 1.0000 | ORAL_TABLET | Freq: Two times a day (BID) | ORAL | 0 refills | Status: AC

## 2021-12-07 MED ORDER — POLYETHYLENE GLYCOL 3350 17 G PO PACK: 17.0000 g | PACK | Freq: Every day | ORAL | Status: DC

## 2021-12-07 NOTE — Discharge Instructions (Addendum)
Laparoscopic Cholecystectomy, Care After ?This sheet gives you information about how to care for yourself after your procedure. Your doctor may also give you more specific instructions. If you have problems or questions, contact your doctor. ?Follow these instructions at home: ?Care for cuts from surgery (incisions) ? ?Follow instructions from your doctor about how to take care of your cuts from surgery. Make sure you: ?Wash your hands with soap and water before you change your bandage (dressing). If you cannot use soap and water, use hand sanitizer. ?Change your bandage as told by your doctor. ?Leave stitches (sutures), skin glue, or skin tape (adhesive) strips in place. They may need to stay in place for 2 weeks or longer. If tape strips get loose and curl up, you may trim the loose edges. Do not remove tape strips completely unless your doctor says it is okay. ?REMOVE OUTER DRESSINGS ON Sunday 12/09/21.  KEEP STERISTRIPS IN PLACE UNTIL THEY FALL OFF ON THEIR OWN. OK TO SHOWER ON Sunday 12/09/21.  ?Check your surgical cut area every day for signs of infection. Check for: ?More redness, swelling, or pain. ?More fluid or blood. ?Warmth. ?Pus or a bad smell. ?Activity ?Do not drive or use heavy machinery while taking prescription pain medicine. ?Do not play contact sports until your doctor says it is okay. ?Do not drive for 24 hours if you were given a medicine to help you relax (sedative). ?Rest as needed. Do not return to work or school until your doctor says it is okay. ?General instructions ? tylenol and advil as needed for discomfort.  Please alternate between the two every four hours as needed for pain.   ? Use narcotics, if prescribed, only when tylenol and motrin is not enough to control pain. ? 325-'650mg'$  every 8hrs to max of '3000mg'$ /24hrs (including the '325mg'$  in every norco dose) for the tylenol.   ? Advil up to '800mg'$  per dose every 8hrs as needed for pain.   ?To prevent or treat constipation while you are  taking prescription pain medicine, your doctor may recommend that you: ?Drink enough fluid to keep your pee (urine) clear or pale yellow. ?Take over-the-counter or prescription medicines. ?Eat foods that are high in fiber, such as fresh fruits and vegetables, whole grains, and beans. ?Limit foods that are high in fat and processed sugars, such as fried and sweet foods. ?Contact a doctor if: ?You develop a rash. ?You have more redness, swelling, or pain around your surgical cuts. ?You have more fluid or blood coming from your surgical cuts. ?Your surgical cuts feel warm to the touch. ?You have pus or a bad smell coming from your surgical cuts. ?You have a fever. ?One or more of your surgical cuts breaks open. ?You have trouble breathing. ?You have chest pain. ?You have pain that is getting worse in your shoulders. ?You faint or feel dizzy when you stand. ?You have very bad pain in your belly (abdomen). ?You are sick to your stomach (nauseous) for more than one day. ?You have throwing up (vomiting) that lasts for more than one day. ?You have leg pain. ?This information is not intended to replace advice given to you by your health care provider. Make sure you discuss any questions you have with your health care provider. ?Document Released: 05/21/2008 Document Revised: 03/02/2016 Document Reviewed: 01/29/2016 ?Elsevier Interactive Patient Education ? 2019 [place]. ? ?

## 2021-12-07 NOTE — Progress Notes (Signed)
Pt was transported to CT and back while on the vent. ?

## 2021-12-07 NOTE — Progress Notes (Signed)
OT Cancellation Note ? ?Patient Details ?Name: Kathleen Blankenship ?MRN: [id number] ?DOB: [birth year] ? ? ?Cancelled Treatment:    Reason Eval/Treat Not Completed: OT screened, no needs identified, will sign off. Per PT , pt is independent with self care needed and does not need skilled OT intervention at this time. OT to SIGN OFF. ? ?Darleen Crocker, MS, OTR/L , CBIS ?ascom [phone]  ?12/07/21, 1:39 PM  ?

## 2021-12-07 NOTE — Progress Notes (Signed)
?  Transition of Care (TOC) Screening Note ? ? ?Patient Details  ?Name: Kathleen Blankenship ?Date of Birth: 05/10/1951 ? ? ?Transition of Care (TOC) CM/SW Contact:    ?Shelbie Hutching, RN ?Phone Number: ?12/07/2021, 1:32 PM ? ? ? ?Transition of Care Department [facility]) has reviewed patient and no TOC needs have been identified at this time. We will continue to monitor patient advancement through interdisciplinary progression rounds. If new patient transition needs arise, please place a TOC consult. ?  ?

## 2021-12-07 NOTE — Progress Notes (Signed)
PHARMACY CONSULT NOTE  ? ?Pharmacy Consult for Electrolyte Monitoring and Replacement  ? ?Recent Labs: ?Potassium (mmol/L)  ?Date Value  ?12/07/2021 4.6  ?09/10/2012 3.7  ? ?Magnesium (mg/dL)  ?Date Value  ?12/07/2021 2.5 (H)  ? ?Calcium (mg/dL)  ?Date Value  ?12/07/2021 8.3 (L)  ? ?Calcium, Total (mg/dL)  ?Date Value  ?09/10/2012 8.8  ? ?Albumin (g/dL)  ?Date Value  ?12/06/2021 2.2 (L)  ? ?Phosphorus (mg/dL)  ?Date Value  ?12/07/2021 4.0  ? ?Sodium (mmol/L)  ?Date Value  ?12/07/2021 134 (L)  ?09/10/2012 137  ?Corrected Calcium: 9.74 mg/dl ? ?Assessment: ?71 year old female admitted with intraabdominal infection. While in OR for cholecystectomy, suffered brief cardiac arrest. Admitted to ICU. Extubated 4/13. ? ?Goal of Therapy:  ?Electrolytes within normal limits ? ?Plan:  ?No replacement warranted 4/14.  ?Follow up labs in AM ? ?Wynelle Cleveland, PharmD ?Pharmacy Resident  ?12/07/2021 ?8:23 AM ?

## 2021-12-07 NOTE — Progress Notes (Signed)
Pt resting, family at bedside, verbalized understanding of follow up instructions, medication use, IS use, signs of infection, and pain control.  Pt denied any further questions, reports feeling well, NAD.  Will return home via private vehicle with family.  All lines removed, dressings applied, bleeding controlled.  Pt escorted out by staff via wheelchair. ?

## 2021-12-07 NOTE — Progress Notes (Signed)
PHARMACIST - PHYSICIAN COMMUNICATION ? ?CONCERNING: IV to Oral Route Change Policy ? ?RECOMMENDATION: ?This patient is receiving pantoprazole by the intravenous route.  Based on criteria approved by the Pharmacy and Therapeutics Committee, the intravenous medication(s) is/are being converted to the equivalent oral dose form(s). ? ? ?DESCRIPTION: ?These criteria include: ?The patient is eating (either orally or via tube) and/or has been taking other orally administered medications for a least 24 hours ?The patient has no evidence of active gastrointestinal bleeding or impaired GI absorption (gastrectomy, short bowel, patient on TNA or NPO). ? ?If you have questions about this conversion, please contact the Pharmacy Department  ? ?Benita Gutter, RPH ?12/07/2021 8:54 AM  ?

## 2021-12-07 NOTE — Progress Notes (Signed)
Pharmacy Antibiotic Note ? ?Kathleen Blankenship is a 71 y.o. female admitted on 12/06/2021 with  intraabdominal infection .  Pharmacy has been consulted for Zosyn dosing. ? ?Renal function stable. WBC slightly elevated. ? ?Plan: ?Zosyn 3.375g IV q8h (4 hour infusion). ? ?Monitor renal function, course, length of therapy. ? ?Height: '5\' 1"'$  (154.9 cm) ?Weight: 54.4 kg (120 lb) ?IBW/kg (Calculated) : 47.8 ? ?Temp (24hrs), Avg:98.1 ?F (36.7 ?C), Min:97.8 ?F (36.6 ?C), Max:98.3 ?F (36.8 ?C) ? ?Recent Labs  ?Lab 12/04/21 ?1118 12/06/21 ?1119 12/06/21 ?1533 12/07/21 ?0421  ?WBC  --  13.3*  --  10.5  ?CREATININE 0.57 0.51  --  0.58  ?LATICACIDVEN  --  1.1 0.9  --   ? ?  ?Estimated Creatinine Clearance: 50.1 mL/min (by C-G formula based on SCr of 0.58 mg/dL).   ? ?Allergies  ?Allergen Reactions  ? Codeine Hives  ? Methotrexate Nausea Only  ? Remicade [Infliximab] Other (See Comments)  ?  Chest felt heavy, was given benadryl.  ? ? ?Antimicrobials this admission: ?4/13 cefazolin >> 4/13 ?4/13 piperacillin/tazobactam >> ? ?Dose adjustments this admission: ?N/a ? ?Microbiology results: ?4/13 BCx: NG < 24 hours ?4/13 Trach aspirate: rare GPC's ? ?Thank you for allowing pharmacy to be a part of this patient?s care. ? ?Wynelle Cleveland, PharmD ?Pharmacy Resident  ?12/07/2021 ?8:29 AM ? ? ?

## 2021-12-07 NOTE — Discharge Summary (Signed)
Physician Discharge Summary  ?Patient ID: ?Kathleen Blankenship ?MRN: [id number] ?DOB/AGE: Jun 06, 1951 71 y.o. ? ?Admit date: 12/06/2021 ?Discharge date: 12/07/2021 ? ?Admission Diagnoses: choledocolithiasis ? ?Discharge Diagnoses:  ?Same as above, plus PEA, recurrent choledocholithiasis, rib fx ? ?Discharged Condition: good ? ?Hospital Course: admitted for above. Underwent surgery.  Towards end of procedure, noted to be in PEA, and surgery aborted prior to port site closure. CPR and ROSC noted.  See op notes and anesthesia note for details.  Post op, transferred to ICU and recovered well. Extubated shortly afterwards and no additional cardiac events noted.  CT head negative and neurologically intact after sedation wore off.  Day after, remained stable and labs unremarkable, resumed diet and pain well controlled.  During workup in ICU, CT a/p noted recurrent choledocolithiasis, so GI consulted and discussed repeat ERCP as an outpt basis, in 2-[redacted] weeks along with outpt colonscopy.  CARDIAC CLEARENCE PRIOR TO SCHEDULING ERCP. ? ?At time of d/c, tolerating diet and pain controlled.  D/c from step down status and f/u as outpt noted below. ? ?Of note, pt latest vitals showed slightly elevated HR and HTN as noted below.  Pt reported chronic history of whitecoat HTN and elevated HR with no associated symptoms. ? ?Consults:  ICU and GI ? ?Discharge Exam: ?Blood pressure (!) 176/105, pulse 98, temperature 98.2 ?F (36.8 ?C), temperature source Oral, resp. rate 20, height '5\' 1"'$  (1.549 m), weight 54.4 kg, SpO2 93 %. ?General appearance: alert, cooperative, and no distress ?Resp: clear to auscultation bilaterally ?Chest wall: anterior wall TTP as expected from rib fx ?Cardio: regular rate and rhythm ?GI: soft, TTP around incision sites as expected.  Wound dressing remains intact. ? ?Disposition:  ?Discharge disposition: 01-Home or Self Care ? ? ? ? ? ? ?Discharge Instructions   ? ? Discharge patient   Complete by: As directed ?  ? Discharge  disposition: 01-Home or Self Care  ? Discharge patient date: 12/07/2021  ? ?  ? ?Allergies as of 12/07/2021   ? ?   Reactions  ? Codeine Hives  ? Methotrexate Nausea Only  ? Remicade [infliximab] Other (See Comments)  ? Chest felt heavy, was given benadryl.  ? ?  ? ?  ?Medication List  ?  ? ?STOP taking these medications   ? ?acetaminophen 650 MG CR tablet ?Commonly known as: TYLENOL ?Replaced by: acetaminophen 325 MG tablet ?  ? ?  ? ?TAKE these medications   ? ?acetaminophen 325 MG tablet ?Commonly known as: Tylenol ?Take 2 tablets (650 mg total) by mouth every 8 (eight) hours as needed for mild pain. ?Replaces: acetaminophen 650 MG CR tablet ?  ?albuterol 108 (90 Base) MCG/ACT inhaler ?Commonly known as: VENTOLIN HFA ?Inhale 2 puffs into the lungs every 6 (six) hours as needed for wheezing or shortness of breath. ?  ?alendronate 70 MG tablet ?Commonly known as: FOSAMAX ?Take 70 mg by mouth once a week. Sunday ?  ?amoxicillin-clavulanate 875-125 MG tablet ?Commonly known as: Augmentin ?Take 1 tablet by mouth 2 (two) times daily for 5 days. ?  ?atorvastatin 10 MG tablet ?Commonly known as: LIPITOR ?Take 10 mg by mouth daily. ?  ?leflunomide 20 MG tablet ?Commonly known as: ARAVA ?Take 20 mg by mouth daily. ?  ?losartan 50 MG tablet ?Commonly known as: COZAAR ?Take 50 mg by mouth daily. ?  ?metoprolol succinate 50 MG 24 hr tablet ?Commonly known as: TOPROL-XL ?Take 50 mg by mouth daily. ?  ?pantoprazole 40 MG tablet ?Commonly known as:  PROTONIX ?Take 40 mg by mouth daily. ?  ?Rinvoq 15 MG Tb24 ?Generic drug: Upadacitinib ER ?Take 15 mg by mouth daily. ?  ?sertraline 50 MG tablet ?Commonly known as: ZOLOFT ?Take 50 mg by mouth at bedtime. ?  ?traMADol 50 MG tablet ?Commonly known as: ULTRAM ?Take 1 tablet (50 mg total) by mouth every 6 (six) hours as needed for moderate pain or severe pain (for pain not controlled by tylenol). ?What changed:  ?when to take this ?reasons to take this ?  ?Vitamin D 125 MCG (5000 UT)  Caps ?Take 5,000 Units by mouth daily. ?  ? ?  ? ? Follow-up Information   ? ? Murel Shenberger, DO Follow up in 2 week(s).   ?Specialty: Surgery ?Why: post op lap chole ?Contact information: ?[address] ?[place] [address] ?[phone] ? ? ?  ?  ? ? Lucilla Lame, MD. Call.   ?Specialty: Gastroenterology ?Why: followup for CBD stone ?Contact information: ?[address] ?[place]  [place] [address] ?[phone] ? ? ?  ?  ? ? Tracie Harrier, MD. Schedule an appointment as soon as possible for a visit.   ?Specialty: Internal Medicine ?Why: for cardiac clearence for repeat ERCP.  s/p cardiac arrest in the hospital ?Contact information: ?[address] ?[place] [address] ?[phone] ? ? ?  ?  ? ?  ?  ? ?  ? ? ? ?Total time spent arranging discharge was >7mn. ?Signed: ?IBenjamine Sprague?12/07/2021, 1:21 PM ? ?

## 2021-12-07 NOTE — Progress Notes (Signed)
? ?NAME:  Kathleen Blankenship, MRN:  [id number], DOB:  12/09/1950, LOS: 1 ?ADMISSION DATE:  12/06/2021, CONSULTATION DATE:  12/06/2021 ?REFERRING MD:  Dr. Lysle Pearl, CHIEF COMPLAINT:  Cardiac Arrest  ? ?Brief Pt Description / Synopsis:  ?71 y.o. Female with Choledocholithiasis s/p ERCP on 11/22/21, who underwent elective Robotic Assisted Laparoscopic Cholecystectomy on 4/13.  Intra-op suffered brief PEA cardiac arrest (reported <2 minutes of ACLS before ROSC), suspect due to hypotension with loss of IV access.  Returns to ICU post procedure and remains intubated and unresponsive.   ? ?History of Present Illness:  ?Kathleen Blankenship is a 71 year old female with a past medical history as listed below who presented to [facility] on 12/06/2021 for elective Robotic Assisted Laparoscopic Cholecystectomy.   ? ?A few months ago she was following with her PCP due to sharp pain, discomfort and burning of the epigastric area.  MRI revealed a common bile duct stone.  She underwent ERCP on 11/22/2021 by Dr. Verl Blalock.  She presented today for lap chole with general surgery to prevent future issues. ? ?The procedure was complicated by brief cardiac arrest.  Per handoff report from anesthesia, the patient was hypotensive for approximately 10 minutes due to loss of IV access and inability to administer vasopressors during that time.  She then lost a pulse with ROSC obtained after approximately 2 minutes of ACLS. ? ?She was transferred to the ICU and remains unresponsive and intubated.  Anesthesia reports patient did receive Versed and paralytics while in the OR.  PCCM consulted for further management. ? ?12/07/21- patient stable on room air , plan for possible dc home from surgical perspective.  PT/OT prior to dc, left hand pain improved. Restarting home meds passed swallow. Transfer to step down. ? ?Pertinent  Medical History  ?Coronary artery disease ?Angina ?Hypertension ?GERD ?Anxiety ?Arthritis ?Tobacco abuse (smoking average of 1 pack/day) ? ?Micro Data:   ?4/13: Blood culture x2>> ?4/13: Tracheal aspirate>> ?4/13: Urine>> ? ?Antimicrobials:  ?Zosyn 4/13>> ? ?Significant Hospital Events: ?Including procedures, antibiotic start and stop dates in addition to other pertinent events   ?4/13: Underwent elective Robotic Assisted Laparoscopic Cholecystectomy.  Intra-op suffered brief cardiac arrest, suspect due to hypotension with loss of IV access.  Returns to ICU post procedure and remains intubated, unresponsive.  PCCM consulted. ? ? ?Objective   ?Blood pressure (!) 166/103, pulse 95, temperature 98.2 ?F (36.8 ?C), temperature source Oral, resp. rate (!) 25, height 5' 1"  (1.549 m), weight 54.4 kg, SpO2 99 %. ?   ?Vent Mode: PSV;CPAP ?FiO2 (%):  [40 %-50 %] 40 % ?Set Rate:  [16 bmp] 16 bmp ?Vt Set:  [400 mL] 400 mL ?PEEP:  [5 cmH20] 5 cmH20 ?Pressure Support:  [5 cmH20-10 cmH20] 5 cmH20  ? ?Intake/Output Summary (Last 24 hours) at 12/07/2021 1006 ?Last data filed at 12/07/2021 0600 ?Gross per 24 hour  ?Intake 1043.46 ml  ?Output 1235 ml  ?Net -191.54 ml  ? ? ?Filed Weights  ? 12/06/21 0834  ?Weight: 54.4 kg  ? ? ?Examination: ?General: NAD  ?normocephalic, neck supple, no JVD, orally intubated ?Lungs: Coarse breath sounds bilaterally, even, nonlabored, synchronous with the vent ?Cardiovascular: Tachycardia, regular rhythm, S1-S2, no murmurs, rubs, gallops ?Abdomen: steri strips on surgical wound ?Extremities: Left hand swelling improved ?Neuro: Unresponsive (received Versed and paralytics in OR), pupils PERRLA at 3 mm bilaterally and brisk ?GU: Foley catheter being placed draining yellow urine ? ? ?Assessment & Plan:  ? ?Brief PEA Cardiac Arrest, suspect due to hypotension with loss of  IV access intra-op ?Hypertension ?PMHx: Hypertension, CAD, Angina- RESOLVED  ?-Serial EKG's ?-Continuous cardiac monitoring ?-Maintain MAP >65 ?-Prn Labetalol, Hydralazine, and Nicardipine infusion if needed to maintain SBP 160-180 ?-Trend lactic acid until normalized (1.1 ~ ) ?-Trend HS  Troponin until peaked (11 ~ ) ?-Obtain Echocardiogram ? ?Acute Hypercapnic Respiratory Failure in setting of Cardiac Arrest- RESOLVED  ?? Aspiration vs Atelectasis on Chest X-ray ?-Full vent support, implement lung protective strategies ?-Plateau pressures less than 30 cm H20 ?-Wean FiO2 & PEEP as tolerated to maintain O2 sats >92% ?-Follow intermittent Chest X-ray & ABG as needed ?-Spontaneous Breathing Trials when respiratory parameters met and mental status permits ?-Implement VAP Bundle ?-Prn Bronchodilators ?-ABX as above ? ?Choledocholithiasis s/p Robotic Assisted Laparoscopic Cholecystectomy on 4/13 ?-S/p ERCP on 3/30 with Dr. Allen Norris ?-NPO ?-NGT to LIS ?-General Surgery following, appreciate input ?-Discussed with Dr. Lysle Pearl, due to Cardiac arrest, had to emergently remove gallbladder and incision sites covered with steri strips ~ recommends ABX for intra-abdominal coverage for now ?-Repeat CT abdomen/pelvis with "mild dilatation of common duct with 5 mm hyper attenuating stone, along with  Prominent circumferential wall thickening distal rectum into the anus. This could be infectious/inflammatory or related to neoplasm." ?-GI consulted, appreciate input ? ?Leukocytosis, ? reactive vs ? Aspiration vs ? Intra-abdominal process ?-Monitor fever curve ?-Trend WBC's & Procalcitonin ?-Follow cultures as above ?-Start empiric Zosyn pending cultures & sensitivities ?-Discussed with Dr. Lanney Gins, will obtain CT Abdomen & Pelvis ? ?Acute Metabolic Encephalopathy in setting of Cardiac Arrest ?Sedation needs in setting of mechanical ventilation ?PMHx: Anxiety ?-Maintain a RASS goal of 0 to -1 ?-Prn Fentanyl and Versed to maintain RASS goal ?-Avoid sedating medications as able ?-Daily wake up assessment ?-Obtain CT Head ~ negative for acute intracranial abnormality ?-UDS + for Benzodiazepines ? ?Hypokalemia ?-Monitor I&O's / urinary output ?-Follow BMP ?-Ensure adequate renal perfusion ?-Avoid nephrotoxic agents as  able ?-Replace electrolytes as indicated ?-Pharmacy consulted for assistance with electrolyte replacement ? ? ? ? ?Best Practice (right click and "Reselect all SmartList Selections" daily)  ? ?Diet/type: NPO ?DVT prophylaxis: SCD ?GI prophylaxis: PPI ?Lines: Arterial Line and yes and it is still needed ?Foley:  Yes, and it is still needed ?Code Status:  full code ?Last date of multidisciplinary goals of care discussion [N/A] ? ?Updated patient's son and patient's sister at bedside 4/13.  All questions answered to their satisfaction. ? ?Labs   ?CBC: ?Recent Labs  ?Lab 12/06/21 ?1119 12/07/21 ?0421  ?WBC 13.3* 10.5  ?HGB 10.7* 12.0  ?HCT 32.9* 36.5  ?MCV 91.6 89.9  ?PLT 392 372  ? ? ?Basic Metabolic Panel: ?Recent Labs  ?Lab 12/04/21 ?1118 12/06/21 ?1119 12/07/21 ?0421  ?NA 137 140 134*  ?K 3.5 2.7* 4.6  ?CL 102 114* 101  ?CO2 25 21* 25  ?GLUCOSE 101* 137* 121*  ?BUN 8 8 12   ?CREATININE 0.57 0.51 0.58  ?CALCIUM 8.9 6.5* 8.3*  ?MG  --   --  2.5*  ?PHOS  --   --  4.0  ? ? ?GFR: ?Estimated Creatinine Clearance: 50.1 mL/min (by C-G formula based on SCr of 0.58 mg/dL). ?Recent Labs  ?Lab 12/06/21 ?1119 12/06/21 ?1533 12/07/21 ?0421  ?PROCALCITON <0.10  --  <0.10  ?WBC 13.3*  --  10.5  ?LATICACIDVEN 1.1 0.9  --   ? ? ?Liver Function Tests: ?Recent Labs  ?Lab 12/06/21 ?1119 12/07/21 ?0421  ?AST 26 33  ?ALT 18 21  ?ALKPHOS 120 143*  ?BILITOT 0.6 0.9  ?PROT 4.5* 5.6*  ?  ALBUMIN 2.2* 2.8*  ? ?No results for input(s): LIPASE, AMYLASE in the last 168 hours. ?No results for input(s): AMMONIA in the last 168 hours. ? ?ABG ?   ?Component Value Date/Time  ? PHART 7.32 (L) 12/06/2021 1148  ? PCO2ART 54 (H) 12/06/2021 1148  ? PO2ART 139 (H) 12/06/2021 1148  ? HCO3 27.8 12/06/2021 1148  ? ACIDBASEDEF 0.3 12/06/2021 1051  ? O2SAT 100 12/06/2021 1148  ? ?  ? ?Coagulation Profile: ?Recent Labs  ?Lab 12/06/21 ?1119  ?INR 1.0  ? ? ?Cardiac Enzymes: ?No results for input(s): CKTOTAL, CKMB, CKMBINDEX, TROPONINI in the last 168  hours. ? ?HbA1C: ?No results found for: HGBA1C ? ?CBG: ?Recent Labs  ?Lab 12/06/21 ?1109  ?GLUCAP 147*  ? ? ? ?Review of Systems:   ?Unable to assess due to intubation/sedation, AMS, and critical illness ? ? ?Past Medical History:

## 2021-12-07 NOTE — Evaluation (Signed)
Physical Therapy Evaluation ?Patient Details ?Name: Kathleen Blankenship ?MRN: [id number] ?DOB: 1951/01/19 ?Today's Date: 12/07/2021 ? ?History of Present Illness ? Pt is a 71 y.o. Female with Choledocholithiasis s/p ERCP on 11/22/21, who underwent elective Robotic Assisted Laparoscopic Cholecystectomy on 4/13.  Intra-op suffered brief PEA cardiac arrest (reported <2 minutes of ACLS before ROSC), suspect due to hypotension with loss of IV access.  PMH of HTN, CAD, anxiety. ?  ?Clinical Impression ? Patient alert, sitting on BSC upon PT entrance. Reported at baseline she is independent, and lives alone but has a lot of support at discharge from family if needed. ? ?The patient performed pericare modI. Sit <> stand from [facility] to EOB with handheld assist at pt request, but not necessarily required. She was able to don her socks EOB independently as well. Sit <> stand with supervision, and ambulated ~85f in room with supervision, no AD. Pt a bit slower and a bit more short of breath than normal, but reported near baseline and any AD at home if needed. PT also reviewed incentive spirometer, sleeping positions, as well as log roll technique, pt verbalized understanding. Overall the patient demonstrated near return to baseline level of function, no acute PT needs indicated. Pt/family did verbalize a desire for chronic lymphedema management for RUE, PT also provided information about outpatient follow up.   ?   ? ?Recommendations for follow up therapy are one component of a multi-disciplinary discharge planning process, led by the attending physician.  Recommendations may be updated based on patient status, additional functional criteria and insurance authorization. ? ?Follow Up Recommendations No PT follow up ? ?  ?Assistance Recommended at Discharge Intermittent Supervision/Assistance  ?Patient can return home with the following ? Assistance with cooking/housework;Assist for transportation;Help with stairs or ramp for entrance ? ?   ?Equipment Recommendations    ?Recommendations for Other Services ?    ?  ?Functional Status Assessment  (limited decline from PLOF)  ? ?  ?Precautions / Restrictions Precautions ?Precautions: Fall ?Restrictions ?Weight Bearing Restrictions: No ?Other Position/Activity Restrictions: R a line currently  ? ?  ? ?Mobility ? Bed Mobility ?Overal bed mobility: Needs Assistance ?Bed Mobility: Supine to Sit, Sit to Supine ?  ?  ?Supine to sit: Supervision ?Sit to supine: Supervision ?  ?  ?  ? ?Transfers ?Overall transfer level: Needs assistance ?Equipment used: None, 1 person hand held assist ?Transfers: Sit to/from Stand, Bed to chair/wheelchair/BSC ?Sit to Stand: Supervision ?Stand pivot transfers: Min assist ?  ?  ?  ?  ?General transfer comment: [facility] not required ?  ? ?Ambulation/Gait ?Ambulation/Gait assistance: Supervision ?Gait Distance (Feet): 15 Feet ?Assistive device: None ?  ?  ?  ?  ?General Gait Details: slow, but no AD needed. Pt without LOB. did report a little shortness of breath ? ?Stairs ?  ?  ?  ?  ?  ? ?Wheelchair Mobility ?  ? ?Modified Rankin (Stroke Patients Only) ?  ? ?  ? ?Balance Overall balance assessment: Needs assistance ?Sitting-balance support: Feet supported ?Sitting balance-Leahy Scale: Good ?  ?  ?  ?Standing balance-Leahy Scale: Good ?  ?  ?  ?  ?  ?  ?  ?  ?  ?  ?  ?  ?   ? ? ? ?Pertinent Vitals/Pain Pain Assessment ?Pain Assessment: Faces ?Faces Pain Scale: Hurts a little bit ?Pain Location: abdomen/chest ?Pain Descriptors / Indicators: Aching, Guarding, Grimacing ?Pain Intervention(s):  Limited activity within patient's tolerance, Monitored during session, Repositioned  ? ? ?Home Living Family/patient expects to be discharged to:: Private residence ?Living Arrangements: Alone ?Available Help at Discharge: Family;Friend(s) ?Type of Home: Mobile home ?Home Access: Stairs to enter ?  ?Entrance Stairs-Number of Steps: 5 ?  ?Home  Layout: One level ?Home Equipment: Conservation officer, nature (2 wheels);Cane - single point ?   ?  ?Prior Function Prior Level of Function : Independent/Modified Independent ?  ?  ?  ?  ?  ?  ?  ?  ?  ? ? ?Hand Dominance  ? Dominant Hand: Right ? ?  ?Extremity/Trunk Assessment  ? Upper Extremity Assessment ?Upper Extremity Assessment: Overall WFL for tasks assessed ?  ? ?Lower Extremity Assessment ?Lower Extremity Assessment: Overall WFL for tasks assessed ?  ? ?   ?Communication  ? Communication: No difficulties  ?Cognition Arousal/Alertness: Awake/alert ?Behavior During Therapy: [facility] for tasks assessed/performed ?Overall Cognitive Status: Within Functional Limits for tasks assessed ?  ?  ?  ?  ?  ?  ?  ?  ?  ?  ?  ?  ?  ?  ?  ?  ?  ?  ?  ? ?  ?General Comments   ? ?  ?Exercises    ? ?Assessment/Plan  ?  ?PT Assessment Patient does not need any further PT services  ?PT Problem List   ? ?   ?  ?PT Treatment Interventions     ? ?PT Goals (Current goals can be found in the Care Plan section)  ?  ? ?  ?Frequency   ?  ? ? ?Co-evaluation   ?  ?  ?  ?  ? ? ?  ?AM-PAC PT "6 Clicks" Mobility  ?Outcome Measure Help needed turning from your back to your side while in a flat bed without using bedrails?: None ?Help needed moving from lying on your back to sitting on the side of a flat bed without using bedrails?: None ?Help needed moving to and from a bed to a chair (including a wheelchair)?: None ?Help needed standing up from a chair using your arms (e.g., wheelchair or bedside chair)?: None ?Help needed to walk in hospital room?: None ?Help needed climbing 3-5 steps with a railing? : A Little ?6 Click Score: 23 ? ?  ?End of Session   ?Activity Tolerance: Patient tolerated treatment well ?Patient left: in bed;with family/visitor present ?Nurse Communication: Mobility status ?PT Visit Diagnosis: Other abnormalities of gait and mobility (R26.89);Difficulty in walking, not elsewhere classified (R26.2);Muscle weakness (generalized) (M62.81) ?   ? ?Time: 8756-4332 ?PT Time Calculation (min) (ACUTE ONLY): 18 min ? ? ?Charges:   PT Evaluation ?$PT Eval Low Complexity: 1 Low ?PT Treatments ?$Therapeutic Activity: 8-22 mins ?  ?   ? ? ?Lieutenant Diego PT, DPT ?1:47 PM,12/07/21 ? ? ?

## 2021-12-07 NOTE — Progress Notes (Signed)
Subjective:  ?CC: ?Kathleen Blankenship is a 71 y.o. female  Hospital stay day 1, 1 Day Post-Op robot lap chole, intraop PEA, CPR, ROSC ? ?HPI: ?No acute issues overnight.  Felling well today, some soreness around chest. ? ?ROS:  ?General: Denies weight loss, weight gain, fatigue, fevers, chills, and night sweats. ?Heart: Denies chest pain, palpitations, racing heart, irregular heartbeat, leg pain or swelling, and decreased activity tolerance. ?Respiratory: Denies breathing difficulty, shortness of breath, wheezing, cough, and sputum. ?GI: Denies change in appetite, heartburn, nausea, vomiting, constipation, diarrhea, and blood in stool. ?GU: Denies difficulty urinating, pain with urinating, urgency, frequency, blood in urine. ? ? ?Objective:  ? ?Temp:  [97.8 ?F (36.6 ?C)-98.3 ?F (36.8 ?C)] 98.2 ?F (36.8 ?C) (04/14 0800) ?Pulse Rate:  [68-95] 95 (04/14 0900) ?Resp:  [13-29] 25 (04/14 0900) ?BP: (96-191)/(66-113) 166/103 (04/14 0900) ?SpO2:  [97 %-100 %] 99 % (04/14 0900) ?Arterial Line BP: (110-182)/(55-105) 110/105 (04/13 2100) ?FiO2 (%):  [40 %] 40 % (04/13 1417)     Height: '5\' 1"'$  (154.9 cm) Weight: 54.4 kg BMI (Calculated): 22.69  ? ?Intake/Output this shift:  ? ?Intake/Output Summary (Last 24 hours) at 12/07/2021 1120 ?Last data filed at 12/07/2021 1047 ?Gross per 24 hour  ?Intake 650.96 ml  ?Output 1435 ml  ?Net -784.04 ml  ? ? ?Constitutional :  alert, cooperative, appears stated age, and no distress  ?Respiratory:  clear to auscultation bilaterally  ?Cardiovascular:  regular rate and rhythm  ?Gastrointestinal: Soft, no guarding, TTP around incisions as expected .   ?Skin: Cool and moist. Incision steristrips and tegaderm intact  ?Psychiatric: Normal affect, non-agitated, not confused  ?   ?  ?LABS:  ? ?  Latest Ref Rng & Units 12/07/2021  ?  4:21 AM 12/06/2021  ? 11:19 AM 12/04/2021  ? 11:18 AM  ?CMP  ?Glucose 70 - 99 mg/dL 121   137   101    ?BUN 8 - 23 mg/dL '12   8   8    '$ ?Creatinine 0.44 - 1.00 mg/dL 0.58   0.51    0.57    ?Sodium 135 - 145 mmol/L 134   140   137    ?Potassium 3.5 - 5.1 mmol/L 4.6   2.7   3.5    ?Chloride 98 - 111 mmol/L 101   114   102    ?CO2 22 - 32 mmol/L '25   21   25    '$ ?Calcium 8.9 - 10.3 mg/dL 8.3   6.5   8.9    ?Total Protein 6.5 - 8.1 g/dL 5.6   4.5     ?Total Bilirubin 0.3 - 1.2 mg/dL 0.9   0.6     ?Alkaline Phos 38 - 126 U/L 143   120     ?AST 15 - 41 U/L 33   26     ?ALT 0 - 44 U/L 21   18     ? ? ?  Latest Ref Rng & Units 12/07/2021  ?  4:21 AM 12/06/2021  ? 11:19 AM 07/30/2020  ?  2:32 AM  ?CBC  ?WBC 4.0 - 10.5 K/uL 10.5   13.3   8.3    ?Hemoglobin 12.0 - 15.0 g/dL 12.0   10.7   8.9    ?Hematocrit 36.0 - 46.0 % 36.5   32.9   25.9    ?Platelets 150 - 400 K/uL 372   392   211    ? ? ?RADS: ?N/a ?Assessment:  ? ?  S/p elective robot lap chole for choledocolithiasis, intraop PEA, CPR, ROSC. ? ?Subsequent imaging noted recurrent CBD stone. Labs remain unremarkable, pending GI input. If no immediate need for intervention, can consider d/c and f/u as an outpt basis. ? ?Pt stable otherwise and recovering well from event.  Off vent, pain controlled.  Will resume regular diet today.  Switched to oral abx from IV abx for intra-abdominal infection coverage secondary to incomplete closure of port site wounds to perform CPR. ? ?labs/images/medications/previous chart entries reviewed personally and relevant changes/updates noted above. ? ? ?

## 2021-12-07 NOTE — Consult Note (Signed)
? ? ? ?Kathleen Darby, MD ?[address]  ?[address]  ?[place], [place] [address]  ?Main: [phone]  ?Fax: [phone] ?Pager: [phone] ? ? Consultation ? ?Referring Provider:     No ref. provider found ?Primary Care Physician:  Tracie Harrier, MD ?Primary Gastroenterologist:  Dr. Lucilla Lame      ?Reason for Consultation:     Choledocholithiasis ? ?Date of Admission:  12/06/2021 ?Date of Consultation:  12/07/2021 ?       ? HPI:   ?Kathleen Blankenship is a 71 y.o. female with history of choledocholithiasis based on MRCP on 11/02/2021, moderate hiatal hernia, underwent outpatient ERCP on 11/22/2021 s/o biliary sphincterotomy and stone extraction. Pt underwent elective robotic assisted laparoscopic cholecystectomy yesterday.  Patient was found to be in PEA at the end of surgery, with successful return of spontaneous circulation and was transferred to ICU, intubated and sedated.  Patient is extubated and has been doing well.  Patient underwent CT chest abdomen and pelvis after cardiac arrest yesterday, incidentally found to have nonobstructing 5 mm stone in the CBD, CBD measuring 7 mm in diameter.  She was also found to have prominent circumferential wall thickening in the distal rectum to the anus which could be infectious or inflammatory or related to neoplasm.  Labs prior to surgery revealed alkaline phosphatase 120, AST 26, ALT 18, T. bili 0.6.  Labs today reveal alkaline phosphatase 143, AST 33, ALT 21, T. bili 0.9.  WBC count 10.5, hemoglobin 12, normal BUN/creatinine ? ?Patient is tolerating diet well.  She denies any abdominal pain, nausea or vomiting. ? ?NSAIDs: None ? ?Antiplts/Anticoagulants/Anti thrombotics: None ? ?GI Procedures:  ?ERCP 11/22/2021 ?- The major papilla was located entirely within a diverticulum. ?- The entire main bile duct was mildly dilated. ?- Choledocholithiasis was found. Complete removal was accomplished by biliary sphincterotomy and balloon extraction. ?- A biliary  sphincterotomy was performed. ?- The biliary tree was swept. ? ?Past Medical History:  ?Diagnosis Date  ? Anginal pain ([place])   ? Anxiety   ? Arthritis   ? RA  ? Complication of anesthesia 12/06/2021  ? PEA Cardiac Arrest with ROSC after 1 round of chest compressions and 1 mg epinephrine. Infiltrated IV found after rescusitative attempts were made.  ? Coronary artery disease   ? GERD (gastroesophageal reflux disease)   ? Hypertension   ? Pneumonia   ? ? ?Past Surgical History:  ?Procedure Laterality Date  ? CARDIAC CATHETERIZATION    ? CORONARY/GRAFT ACUTE MI REVASCULARIZATION N/A 05/28/2017  ? Procedure: Coronary/Graft Acute MI Revascularization;  Surgeon: Isaias Cowman, MD;  Location: [facility];  Service: Cardiovascular;  Laterality: N/A;  ? ERCP N/A 11/22/2021  ? Procedure: ENDOSCOPIC RETROGRADE CHOLANGIOPANCREATOGRAPHY (ERCP);  Surgeon: Lucilla Lame, MD;  Location: [facility];  Service: Endoscopy;  Laterality: N/A;  ? LEFT HEART CATH AND CORONARY ANGIOGRAPHY N/A 05/28/2017  ? Procedure: LEFT HEART CATH AND CORONARY ANGIOGRAPHY;  Surgeon: Isaias Cowman, MD;  Location: [facility];  Service: Cardiovascular;  Laterality: N/A;  ? ORIF ELBOW FRACTURE Right 07/26/2020  ? Procedure: OPEN REDUCTION INTERNAL FIXATION (ORIF) ELBOW/OLECRANON FRACTURE;  Surgeon: Shona Needles, MD;  Location: [facility];  Service: Orthopedics;  Laterality: Right;  ? REVERSE SHOULDER ARTHROPLASTY Left 07/29/2020  ? Procedure: REVERSE SHOULDER ARTHROPLASTY;  Surgeon: Hiram Gash, MD;  Location: [facility];  Service: Orthopedics;  Laterality: Left;  ? THYROID LOBECTOMY    ? ? ?Current Facility-Administered Medications:  ?  albuterol (PROVENTIL) (2.5 MG/3ML)  0.083% nebulizer solution 2.5 mg, 2.5 mg, Nebulization, Q4H PRN, Kathleen Bienenstock, NP ?  amoxicillin-clavulanate (AUGMENTIN) 875-125 MG per tablet 1 tablet, 1 tablet, Oral, Q12H, Kathleen, Isami, DO, 1 tablet at 12/07/21 1045 ?  docusate sodium (COLACE) capsule  100 mg, 100 mg, Oral, BID, Kathleen Blankenship, RPH ?  hydrALAZINE (APRESOLINE) injection 10 mg, 10 mg, Intravenous, Q6H PRN, Kathleen Hong D, NP, 10 mg at 12/07/21 1204 ?  morphine (PF) 2 MG/ML injection 1-2 mg, 1-2 mg, Intravenous, Q4H PRN, Kathleen Hong D, NP, 2 mg at 12/07/21 0817 ?  nicardipine (CARDENE) 9m in 0.86% saline 2010mIV infusion (0.1 mg/ml), 3-15 mg/hr, Intravenous, Continuous, Kathleen Blankenship ?  [SDerrill MemoN 12/08/2021] pantoprazole (PROTONIX) EC tablet 40 mg, 40 mg, Oral, Daily, Kathleen Blankenship ?  [START ON 12/08/2021] polyethylene glycol (MIRALAX / GLYCOLAX) packet 17 g, 17 g, Oral, Daily, Kathleen Blankenship ?  sodium chloride flush (NS) 0.9 % injection 10-40 mL, 10-40 mL, Intracatheter, Q12H, Kathleen, Isami, DO, 10 mL at 12/07/21 0908 ?  sodium chloride flush (NS) 0.9 % injection 10-40 mL, 10-40 mL, Intracatheter, PRN, Kathleen, Isami, DO ?  traMADol (ULTRAM) tablet 50 mg, 50 mg, Oral, Q12H PRN, Kathleen PearlIsami, DO ? ? ?Family History  ?Family history unknown: Yes  ?  ? ?Social History  ? ?Tobacco Use  ? Smoking status: Every Day  ?  Packs/day: 1.00  ?  Years: 37.00  ?  Pack years: 37.00  ?  Types: Cigarettes  ? Smokeless tobacco: Never  ?Substance Use Topics  ? Alcohol use: No  ? Drug use: No  ? ? ?Allergies as of 11/26/2021 - Review Complete 11/22/2021  ?Allergen Reaction Noted  ? Codeine Hives 05/28/2017  ? Remicade [infliximab] Other (See Comments) 07/26/2020  ? ? ?Review of Systems:    ?All systems reviewed and negative except where noted in HPI. ? ? Physical Exam:  ?Vital signs in last 24 hours: ?Temp:  [97.8 ?F (36.6 ?C)-98.2 ?F (36.8 ?C)] 98.2 ?F (36.8 ?C) (04/14 0800) ?Pulse Rate:  [68-102] 98 (04/14 1200) ?Resp:  [13-31] 20 (04/14 1200) ?BP: (119-176)/(72-113) 176/105 (04/14 1200) ?SpO2:  [92 %-100 %] 93 % (04/14 1200) ?Arterial Line BP: (110-148)/(62-105) 110/105 (04/13 2100) ?  ?General:   Pleasant, cooperative in NAD ?Head:  Normocephalic and atraumatic. ?Eyes:   No icterus.    Conjunctiva pink. PERRLA. ?Ears:  Normal auditory acuity. ?Neck:  Supple; no masses or thyroidomegaly ?Lungs: Respirations even and unlabored. Lungs clear to auscultation bilaterally.   No wheezes, crackles, or rhonchi.  ?Heart:  Regular rate and rhythm;  Without murmur, clicks, rubs or gallops ?Abdomen:  Soft, nondistended, nontender. Normal bowel sounds. No appreciable masses or hepatomegaly.  No rebound or guarding.  ?Rectal:  Not performed. ?Msk:  Symmetrical without gross deformities.  Strength generalized weakness ?Extremities:  Without edema, cyanosis or clubbing. ?Neurologic:  Alert and oriented x3;  grossly normal neurologically. ?Skin:  Intact without significant lesions or rashes. ?Psych:  Alert and cooperative. Normal affect. ? ?LAB RESULTS: ? ?  Latest Ref Rng & Units 12/07/2021  ?  4:21 AM 12/06/2021  ? 11:19 AM 07/30/2020  ?  2:32 AM  ?CBC  ?WBC 4.0 - 10.5 K/uL 10.5   13.3   8.3    ?Hemoglobin 12.0 - 15.0 g/dL 12.0   10.7   8.9    ?Hematocrit 36.0 - 46.0 % 36.5   32.9   25.9    ?Platelets 150 - 400 K/uL 372  392   211    ? ? ?BMET ? ?  Latest Ref Rng & Units 12/07/2021  ?  4:21 AM 12/06/2021  ? 11:19 AM 12/04/2021  ? 11:18 AM  ?BMP  ?Glucose 70 - 99 mg/dL 121   137   101    ?BUN 8 - 23 mg/dL 12   8   8     ?Creatinine 0.44 - 1.00 mg/dL 0.58   0.51   0.57    ?Sodium 135 - 145 mmol/L 134   140   137    ?Potassium 3.5 - 5.1 mmol/L 4.6   2.7   3.5    ?Chloride 98 - 111 mmol/L 101   114   102    ?CO2 22 - 32 mmol/L 25   21   25     ?Calcium 8.9 - 10.3 mg/dL 8.3   6.5   8.9    ? ? ?LFT ? ?  Latest Ref Rng & Units 12/07/2021  ?  4:21 AM 12/06/2021  ? 11:19 AM 07/26/2020  ?  5:56 AM  ?Hepatic Function  ?Total Protein 6.5 - 8.1 g/dL 5.6   4.5   5.4    ?Albumin 3.5 - 5.0 g/dL 2.8   2.2   3.2    ?AST 15 - 41 U/L 33   26   22    ?ALT 0 - 44 U/L 21   18   12     ?Alk Phosphatase 38 - 126 U/L 143   120   41    ?Total Bilirubin 0.3 - 1.2 mg/dL 0.9   0.6   1.0    ?Bilirubin, Direct 0.0 - 0.2 mg/dL 0.2      ? ? ? ?STUDIES: ?DG  Abd 1 View ? ?Result Date: 12/06/2021 ?CLINICAL DATA:  Orogastric tube placement. EXAM: ABDOMEN - 1 VIEW COMPARISON:  None. FINDINGS: The bowel gas pattern is normal. Distal tip of nasogastric tube is seen in expected

## 2021-12-09 LAB — URINE CULTURE: Culture: >=100000 — AB

## 2021-12-09 LAB — CULTURE, RESPIRATORY W GRAM STAIN: Culture: NORMAL

## 2021-12-11 LAB — CULTURE, BLOOD (ROUTINE X 2)
Culture: NO GROWTH
Culture: NO GROWTH
Special Requests: ADEQUATE
Special Requests: ADEQUATE

## 2021-12-14 ENCOUNTER — Telehealth: Payer: Self-pay

## 2021-12-14 NOTE — Telephone Encounter (Signed)
Left message on voicemail for pt to return my call to schedule colonoscopy and ERCP ?

## 2021-12-14 NOTE — Telephone Encounter (Signed)
-----   Message from Lin Landsman, MD sent at 12/07/2021  2:26 PM EDT ----- ?Regarding: ERCP and colonoscopy ?Kathleen Blankenship ? ?This patient will need ERCP and colonoscopy on 2 separate days ?Dx: Choledocholithiasis and colonoscopy for rectal wall thickening ? ?These have to be scheduled 3-4 weeks later and she will need cardiac clearance.  Patient will establish care with cardiology at [facility].  Please call her next week to schedule the procedures ? ?Thanks ?RV ? ?

## 2021-12-20 NOTE — Telephone Encounter (Signed)
I spoke to pt to schedule ERCP and colonoscopy and she said she declines to schedule either at this time ?

## 2021-12-20 NOTE — Telephone Encounter (Signed)
Okay.  I see that she has an appointment to see Dr. Allen Norris on 01/24/2022.  Please keep that appointment so that patient can discuss all her concerns with Dr. Allen Norris regarding the procedures that are recommended ? ?Thanks ?RV ?

## 2022-01-09 ENCOUNTER — Ambulatory Visit: Payer: Medicare HMO | Admitting: Gastroenterology

## 2022-01-24 ENCOUNTER — Ambulatory Visit: Payer: Medicare HMO | Admitting: Gastroenterology

## 2022-02-18 ENCOUNTER — Ambulatory Visit: Payer: Medicare HMO | Admitting: Gastroenterology

## 2022-02-19 ENCOUNTER — Other Ambulatory Visit: Payer: Self-pay | Admitting: *Deleted

## 2022-02-19 DIAGNOSIS — F17210 Nicotine dependence, cigarettes, uncomplicated: Secondary | ICD-10-CM

## 2022-02-19 DIAGNOSIS — Z87891 Personal history of nicotine dependence: Secondary | ICD-10-CM

## 2022-02-19 DIAGNOSIS — Z122 Encounter for screening for malignant neoplasm of respiratory organs: Secondary | ICD-10-CM

## 2022-02-22 ENCOUNTER — Other Ambulatory Visit: Payer: Self-pay | Admitting: Family Medicine

## 2022-02-22 DIAGNOSIS — S22000A Wedge compression fracture of unspecified thoracic vertebra, initial encounter for closed fracture: Secondary | ICD-10-CM

## 2022-02-27 ENCOUNTER — Inpatient Hospital Stay: Admission: RE | Admit: 2022-02-27 | Payer: Medicare HMO | Source: Ambulatory Visit

## 2022-02-28 ENCOUNTER — Other Ambulatory Visit: Payer: Self-pay | Admitting: Family Medicine

## 2022-02-28 ENCOUNTER — Ambulatory Visit: Payer: Medicare HMO | Admitting: Physical Therapy

## 2022-02-28 DIAGNOSIS — M5416 Radiculopathy, lumbar region: Secondary | ICD-10-CM

## 2022-02-28 DIAGNOSIS — S22000A Wedge compression fracture of unspecified thoracic vertebra, initial encounter for closed fracture: Secondary | ICD-10-CM

## 2022-03-04 ENCOUNTER — Telehealth: Payer: Self-pay

## 2022-03-04 NOTE — Telephone Encounter (Signed)
Referral from [facility] received in regards to this patients back pain, upon review she is scheduled for Lumbar and thoracic MRI on 03/11/22. She has had received injections.   Schedule with Dr. Izora Ribas

## 2022-03-05 ENCOUNTER — Encounter: Payer: Medicare HMO | Admitting: Physical Therapy

## 2022-03-07 ENCOUNTER — Encounter: Payer: Medicare HMO | Admitting: Physical Therapy

## 2022-03-08 ENCOUNTER — Other Ambulatory Visit: Payer: Medicare HMO

## 2022-03-11 ENCOUNTER — Ambulatory Visit
Admission: RE | Admit: 2022-03-11 | Discharge: 2022-03-11 | Disposition: A | Discharge status: Home / Self Care | Payer: Medicare HMO | Source: Ambulatory Visit | Attending: Family Medicine | Admitting: Family Medicine

## 2022-03-11 DIAGNOSIS — M5416 Radiculopathy, lumbar region: Secondary | ICD-10-CM

## 2022-03-11 DIAGNOSIS — S22000A Wedge compression fracture of unspecified thoracic vertebra, initial encounter for closed fracture: Secondary | ICD-10-CM

## 2022-03-11 NOTE — Telephone Encounter (Signed)
Sister confirmed appt for 04/04/22

## 2022-03-11 NOTE — Telephone Encounter (Signed)
Left message to call back  

## 2022-03-12 ENCOUNTER — Emergency Department: Payer: Medicare HMO

## 2022-03-12 ENCOUNTER — Encounter: Payer: Medicare HMO | Admitting: Physical Therapy

## 2022-03-12 ENCOUNTER — Other Ambulatory Visit: Payer: Self-pay

## 2022-03-12 ENCOUNTER — Inpatient Hospital Stay
Admission: EM | Admit: 2022-03-12 | Discharge: 2022-03-19 | DRG: 521 | Disposition: A | Discharge status: Hospice | Payer: Medicare HMO | Attending: Osteopathic Medicine | Admitting: Osteopathic Medicine

## 2022-03-12 DIAGNOSIS — D638 Anemia in other chronic diseases classified elsewhere: Secondary | ICD-10-CM | POA: Diagnosis present

## 2022-03-12 DIAGNOSIS — M8458XA Pathological fracture in neoplastic disease, other specified site, initial encounter for fracture: Secondary | ICD-10-CM | POA: Diagnosis present

## 2022-03-12 DIAGNOSIS — Z79891 Long term (current) use of opiate analgesic: Secondary | ICD-10-CM

## 2022-03-12 DIAGNOSIS — E785 Hyperlipidemia, unspecified: Secondary | ICD-10-CM | POA: Diagnosis present

## 2022-03-12 DIAGNOSIS — L8990 Pressure ulcer of unspecified site, unspecified stage: Secondary | ICD-10-CM | POA: Insufficient documentation

## 2022-03-12 DIAGNOSIS — J440 Chronic obstructive pulmonary disease with acute lower respiratory infection: Secondary | ICD-10-CM | POA: Diagnosis present

## 2022-03-12 DIAGNOSIS — Z96612 Presence of left artificial shoulder joint: Secondary | ICD-10-CM | POA: Diagnosis present

## 2022-03-12 DIAGNOSIS — I4819 Other persistent atrial fibrillation: Secondary | ICD-10-CM | POA: Diagnosis not present

## 2022-03-12 DIAGNOSIS — J90 Pleural effusion, not elsewhere classified: Secondary | ICD-10-CM | POA: Diagnosis not present

## 2022-03-12 DIAGNOSIS — M84552A Pathological fracture in neoplastic disease, left femur, initial encounter for fracture: Principal | ICD-10-CM | POA: Diagnosis present

## 2022-03-12 DIAGNOSIS — M810 Age-related osteoporosis without current pathological fracture: Secondary | ICD-10-CM | POA: Diagnosis present

## 2022-03-12 DIAGNOSIS — S22040A Wedge compression fracture of fourth thoracic vertebra, initial encounter for closed fracture: Secondary | ICD-10-CM

## 2022-03-12 DIAGNOSIS — R32 Unspecified urinary incontinence: Secondary | ICD-10-CM | POA: Diagnosis present

## 2022-03-12 DIAGNOSIS — F17210 Nicotine dependence, cigarettes, uncomplicated: Secondary | ICD-10-CM | POA: Diagnosis present

## 2022-03-12 DIAGNOSIS — L89892 Pressure ulcer of other site, stage 2: Secondary | ICD-10-CM | POA: Diagnosis present

## 2022-03-12 DIAGNOSIS — Z66 Do not resuscitate: Secondary | ICD-10-CM | POA: Diagnosis present

## 2022-03-12 DIAGNOSIS — I700 Atherosclerosis of aorta: Secondary | ICD-10-CM | POA: Diagnosis present

## 2022-03-12 DIAGNOSIS — C787 Secondary malignant neoplasm of liver and intrahepatic bile duct: Secondary | ICD-10-CM | POA: Diagnosis present

## 2022-03-12 DIAGNOSIS — I5032 Chronic diastolic (congestive) heart failure: Secondary | ICD-10-CM | POA: Diagnosis present

## 2022-03-12 DIAGNOSIS — I10 Essential (primary) hypertension: Secondary | ICD-10-CM

## 2022-03-12 DIAGNOSIS — Z515 Encounter for palliative care: Secondary | ICD-10-CM | POA: Diagnosis not present

## 2022-03-12 DIAGNOSIS — E871 Hypo-osmolality and hyponatremia: Secondary | ICD-10-CM

## 2022-03-12 DIAGNOSIS — N390 Urinary tract infection, site not specified: Secondary | ICD-10-CM | POA: Diagnosis present

## 2022-03-12 DIAGNOSIS — E222 Syndrome of inappropriate secretion of antidiuretic hormone: Secondary | ICD-10-CM | POA: Diagnosis present

## 2022-03-12 DIAGNOSIS — K219 Gastro-esophageal reflux disease without esophagitis: Secondary | ICD-10-CM | POA: Diagnosis present

## 2022-03-12 DIAGNOSIS — Z7401 Bed confinement status: Secondary | ICD-10-CM

## 2022-03-12 DIAGNOSIS — J189 Pneumonia, unspecified organism: Secondary | ICD-10-CM | POA: Diagnosis present

## 2022-03-12 DIAGNOSIS — Z885 Allergy status to narcotic agent status: Secondary | ICD-10-CM

## 2022-03-12 DIAGNOSIS — C7951 Secondary malignant neoplasm of bone: Secondary | ICD-10-CM | POA: Diagnosis present

## 2022-03-12 DIAGNOSIS — M79604 Pain in right leg: Secondary | ICD-10-CM

## 2022-03-12 DIAGNOSIS — F32A Depression, unspecified: Secondary | ICD-10-CM | POA: Diagnosis present

## 2022-03-12 DIAGNOSIS — E43 Unspecified severe protein-calorie malnutrition: Secondary | ICD-10-CM | POA: Diagnosis present

## 2022-03-12 DIAGNOSIS — F419 Anxiety disorder, unspecified: Secondary | ICD-10-CM | POA: Diagnosis present

## 2022-03-12 DIAGNOSIS — S22000A Wedge compression fracture of unspecified thoracic vertebra, initial encounter for closed fracture: Secondary | ICD-10-CM | POA: Diagnosis not present

## 2022-03-12 DIAGNOSIS — C50919 Malignant neoplasm of unspecified site of unspecified female breast: Secondary | ICD-10-CM | POA: Diagnosis present

## 2022-03-12 DIAGNOSIS — N6310 Unspecified lump in the right breast, unspecified quadrant: Secondary | ICD-10-CM | POA: Diagnosis not present

## 2022-03-12 DIAGNOSIS — K8050 Calculus of bile duct without cholangitis or cholecystitis without obstruction: Secondary | ICD-10-CM | POA: Diagnosis present

## 2022-03-12 DIAGNOSIS — Z7983 Long term (current) use of bisphosphonates: Secondary | ICD-10-CM

## 2022-03-12 DIAGNOSIS — K2289 Other specified disease of esophagus: Secondary | ICD-10-CM | POA: Diagnosis present

## 2022-03-12 DIAGNOSIS — I4891 Unspecified atrial fibrillation: Secondary | ICD-10-CM | POA: Diagnosis not present

## 2022-03-12 DIAGNOSIS — C801 Malignant (primary) neoplasm, unspecified: Secondary | ICD-10-CM | POA: Diagnosis not present

## 2022-03-12 DIAGNOSIS — N179 Acute kidney failure, unspecified: Secondary | ICD-10-CM | POA: Diagnosis present

## 2022-03-12 DIAGNOSIS — R000 Tachycardia, unspecified: Secondary | ICD-10-CM | POA: Diagnosis not present

## 2022-03-12 DIAGNOSIS — Z79899 Other long term (current) drug therapy: Secondary | ICD-10-CM

## 2022-03-12 DIAGNOSIS — I110 Hypertensive heart disease with heart failure: Secondary | ICD-10-CM | POA: Diagnosis present

## 2022-03-12 DIAGNOSIS — N3000 Acute cystitis without hematuria: Secondary | ICD-10-CM | POA: Diagnosis not present

## 2022-03-12 DIAGNOSIS — M8450XA Pathological fracture in neoplastic disease, unspecified site, initial encounter for fracture: Secondary | ICD-10-CM | POA: Diagnosis not present

## 2022-03-12 DIAGNOSIS — I2510 Atherosclerotic heart disease of native coronary artery without angina pectoris: Secondary | ICD-10-CM | POA: Diagnosis present

## 2022-03-12 DIAGNOSIS — Z9049 Acquired absence of other specified parts of digestive tract: Secondary | ICD-10-CM

## 2022-03-12 DIAGNOSIS — Z888 Allergy status to other drugs, medicaments and biological substances status: Secondary | ICD-10-CM

## 2022-03-12 DIAGNOSIS — M84452A Pathological fracture, left femur, initial encounter for fracture: Principal | ICD-10-CM

## 2022-03-12 DIAGNOSIS — M069 Rheumatoid arthritis, unspecified: Secondary | ICD-10-CM | POA: Diagnosis present

## 2022-03-12 DIAGNOSIS — Z6821 Body mass index (BMI) 21.0-21.9, adult: Secondary | ICD-10-CM

## 2022-03-12 DIAGNOSIS — Z8674 Personal history of sudden cardiac arrest: Secondary | ICD-10-CM

## 2022-03-12 DIAGNOSIS — K449 Diaphragmatic hernia without obstruction or gangrene: Secondary | ICD-10-CM | POA: Diagnosis present

## 2022-03-12 DIAGNOSIS — R262 Difficulty in walking, not elsewhere classified: Secondary | ICD-10-CM | POA: Diagnosis present

## 2022-03-12 DIAGNOSIS — S22070A Wedge compression fracture of T9-T10 vertebra, initial encounter for closed fracture: Secondary | ICD-10-CM

## 2022-03-12 DIAGNOSIS — D649 Anemia, unspecified: Secondary | ICD-10-CM | POA: Diagnosis present

## 2022-03-12 HISTORY — DX: Malignant (primary) neoplasm, unspecified: C80.1

## 2022-03-12 LAB — CBC WITH DIFFERENTIAL/PLATELET
Abs Immature Granulocytes: 0.07 10*3/uL (ref 0.00–0.07)
Basophils Absolute: 0.1 10*3/uL (ref 0.0–0.1)
Basophils Relative: 1 %
Eosinophils Absolute: 0 10*3/uL (ref 0.0–0.5)
Eosinophils Relative: 0 %
HCT: 25.8 % — ABNORMAL LOW (ref 36.0–46.0)
Hemoglobin: 8.2 g/dL — ABNORMAL LOW (ref 12.0–15.0)
Immature Granulocytes: 1 %
Lymphocytes Relative: 5 %
Lymphs Abs: 0.5 10*3/uL — ABNORMAL LOW (ref 0.7–4.0)
MCH: 29.5 pg (ref 26.0–34.0)
MCHC: 31.8 g/dL (ref 30.0–36.0)
MCV: 92.8 fL (ref 80.0–100.0)
Monocytes Absolute: 1.4 10*3/uL — ABNORMAL HIGH (ref 0.1–1.0)
Monocytes Relative: 15 %
Neutro Abs: 7.5 10*3/uL (ref 1.7–7.7)
Neutrophils Relative %: 78 %
Platelets: 498 10*3/uL — ABNORMAL HIGH (ref 150–400)
RBC: 2.78 MIL/uL — ABNORMAL LOW (ref 3.87–5.11)
RDW: 19.7 % — ABNORMAL HIGH (ref 11.5–15.5)
WBC: 9.6 10*3/uL (ref 4.0–10.5)
nRBC: 0.4 % — ABNORMAL HIGH (ref 0.0–0.2)

## 2022-03-12 LAB — COMPREHENSIVE METABOLIC PANEL
ALT: 21 U/L (ref 0–44)
AST: 53 U/L — ABNORMAL HIGH (ref 15–41)
Albumin: 2.5 g/dL — ABNORMAL LOW (ref 3.5–5.0)
Alkaline Phosphatase: 174 U/L — ABNORMAL HIGH (ref 38–126)
Anion gap: 11 (ref 5–15)
BUN: 20 mg/dL (ref 8–23)
CO2: 19 mmol/L — ABNORMAL LOW (ref 22–32)
Calcium: 8.2 mg/dL — ABNORMAL LOW (ref 8.9–10.3)
Chloride: 100 mmol/L (ref 98–111)
Creatinine, Ser: 0.6 mg/dL (ref 0.44–1.00)
GFR, Estimated: >60 mL/min (ref >60)
Glucose, Bld: 105 mg/dL — ABNORMAL HIGH (ref 70–99)
Potassium: 4.4 mmol/L (ref 3.5–5.1)
Sodium: 130 mmol/L — ABNORMAL LOW (ref 135–145)
Total Bilirubin: 1 mg/dL (ref 0.3–1.2)
Total Protein: 5.5 g/dL — ABNORMAL LOW (ref 6.5–8.1)

## 2022-03-12 LAB — LACTIC ACID, PLASMA: Lactic Acid, Venous: 1.2 mmol/L (ref 0.5–1.9)

## 2022-03-12 MED ORDER — LEFLUNOMIDE 20 MG PO TABS
10.0000 mg | ORAL_TABLET | Freq: Every day | ORAL | Status: DC
  Administered 2022-03-13: 10 mg via ORAL
  Filled 2022-03-12 (×2): qty 0.5

## 2022-03-12 MED ORDER — LOSARTAN POTASSIUM 50 MG PO TABS
50.0000 mg | ORAL_TABLET | Freq: Every day | ORAL | Status: DC
  Administered 2022-03-13 – 2022-03-15 (×3): 50 mg via ORAL
  Filled 2022-03-12 (×3): qty 1

## 2022-03-12 MED ORDER — ALBUTEROL SULFATE (2.5 MG/3ML) 0.083% IN NEBU
3.0000 mL | INHALATION_SOLUTION | Freq: Four times a day (QID) | RESPIRATORY_TRACT | Status: DC | PRN
  Administered 2022-03-16 – 2022-03-17 (×3): 3 mL via RESPIRATORY_TRACT
  Filled 2022-03-12 (×3): qty 3

## 2022-03-12 MED ORDER — METOPROLOL SUCCINATE ER 50 MG PO TB24
50.0000 mg | ORAL_TABLET | Freq: Every day | ORAL | Status: DC
  Administered 2022-03-13 – 2022-03-19 (×7): 50 mg via ORAL
  Filled 2022-03-12 (×7): qty 1

## 2022-03-12 MED ORDER — SODIUM CHLORIDE 0.9 % IV SOLN: INTRAVENOUS | Status: DC

## 2022-03-12 MED ORDER — KETOROLAC TROMETHAMINE 15 MG/ML IJ SOLN: 15.0000 mg | Freq: Four times a day (QID) | INTRAMUSCULAR | Status: AC | PRN

## 2022-03-12 MED ORDER — ENOXAPARIN SODIUM 40 MG/0.4ML IJ SOSY
40.0000 mg | PREFILLED_SYRINGE | INTRAMUSCULAR | Status: DC
  Administered 2022-03-13: 40 mg via SUBCUTANEOUS
  Filled 2022-03-12: qty 0.4

## 2022-03-12 MED ORDER — MORPHINE SULFATE (PF) 4 MG/ML IV SOLN
4.0000 mg | Freq: Once | INTRAVENOUS | Status: AC
  Administered 2022-03-12: 4 mg via INTRAVENOUS
  Filled 2022-03-12: qty 1

## 2022-03-12 MED ORDER — ACETAMINOPHEN 325 MG PO TABS
650.0000 mg | ORAL_TABLET | Freq: Four times a day (QID) | ORAL | Status: DC | PRN
  Administered 2022-03-13: 650 mg via ORAL
  Filled 2022-03-12 (×2): qty 2

## 2022-03-12 MED ORDER — MAGNESIUM HYDROXIDE 400 MG/5ML PO SUSP
30.0000 mL | Freq: Every day | ORAL | Status: DC | PRN
Start: 2022-03-12 — End: 2022-03-19

## 2022-03-12 MED ORDER — HYDROCODONE-ACETAMINOPHEN 7.5-325 MG PO TABS
1.0000 | ORAL_TABLET | Freq: Two times a day (BID) | ORAL | Status: DC | PRN
  Administered 2022-03-13 (×2): 1 via ORAL
  Filled 2022-03-12 (×2): qty 1

## 2022-03-12 MED ORDER — ONDANSETRON HCL 4 MG PO TABS: 4.0000 mg | ORAL_TABLET | Freq: Four times a day (QID) | ORAL | Status: DC | PRN

## 2022-03-12 MED ORDER — SERTRALINE HCL 50 MG PO TABS
50.0000 mg | ORAL_TABLET | Freq: Every day | ORAL | Status: DC
  Administered 2022-03-13 – 2022-03-18 (×7): 50 mg via ORAL
  Filled 2022-03-12 (×7): qty 1

## 2022-03-12 MED ORDER — VITAMIN D 25 MCG (1000 UNIT) PO TABS
5000.0000 [IU] | ORAL_TABLET | Freq: Every day | ORAL | Status: DC
  Administered 2022-03-13 – 2022-03-19 (×7): 5000 [IU] via ORAL
  Filled 2022-03-12 (×7): qty 5

## 2022-03-12 MED ORDER — MORPHINE SULFATE (PF) 2 MG/ML IV SOLN
2.0000 mg | INTRAVENOUS | Status: DC | PRN
  Administered 2022-03-13: 2 mg via INTRAVENOUS
  Filled 2022-03-12: qty 1

## 2022-03-12 MED ORDER — PANTOPRAZOLE SODIUM 40 MG PO TBEC
40.0000 mg | DELAYED_RELEASE_TABLET | Freq: Every day | ORAL | Status: DC
  Administered 2022-03-13 – 2022-03-19 (×7): 40 mg via ORAL
  Filled 2022-03-12 (×7): qty 1

## 2022-03-12 MED ORDER — IOHEXOL 300 MG/ML  SOLN
75.0000 mL | Freq: Once | INTRAMUSCULAR | Status: AC | PRN
  Administered 2022-03-12: 75 mL via INTRAVENOUS

## 2022-03-12 MED ORDER — SODIUM CHLORIDE 0.9 % IV BOLUS
1000.0000 mL | Freq: Once | INTRAVENOUS | Status: AC
  Administered 2022-03-12: 1000 mL via INTRAVENOUS

## 2022-03-12 MED ORDER — GABAPENTIN 300 MG PO CAPS
300.0000 mg | ORAL_CAPSULE | Freq: Three times a day (TID) | ORAL | Status: DC
  Administered 2022-03-13 – 2022-03-15 (×8): 300 mg via ORAL
  Filled 2022-03-12 (×8): qty 1

## 2022-03-12 MED ORDER — TRAZODONE HCL 50 MG PO TABS
25.0000 mg | ORAL_TABLET | Freq: Every evening | ORAL | Status: DC | PRN
  Administered 2022-03-13: 25 mg via ORAL
  Filled 2022-03-12: qty 1

## 2022-03-12 MED ORDER — TRAMADOL HCL 50 MG PO TABS
50.0000 mg | ORAL_TABLET | Freq: Four times a day (QID) | ORAL | Status: DC | PRN
  Administered 2022-03-13 (×2): 50 mg via ORAL
  Filled 2022-03-12 (×2): qty 1

## 2022-03-12 MED ORDER — HYDROMORPHONE HCL 1 MG/ML IJ SOLN
0.5000 mg | Freq: Once | INTRAMUSCULAR | Status: AC
  Administered 2022-03-12: 0.5 mg via INTRAVENOUS
  Filled 2022-03-12: qty 0.5

## 2022-03-12 MED ORDER — ALENDRONATE SODIUM 70 MG PO TABS: 70.0000 mg | ORAL_TABLET | ORAL | Status: DC

## 2022-03-12 MED ORDER — CYCLOBENZAPRINE HCL 10 MG PO TABS
5.0000 mg | ORAL_TABLET | Freq: Every evening | ORAL | Status: DC | PRN
  Administered 2022-03-13: 10 mg via ORAL
  Filled 2022-03-12: qty 1

## 2022-03-12 MED ORDER — ACETAMINOPHEN 650 MG RE SUPP: 650.0000 mg | Freq: Four times a day (QID) | RECTAL | Status: DC | PRN

## 2022-03-12 MED ORDER — ATORVASTATIN CALCIUM 10 MG PO TABS
10.0000 mg | ORAL_TABLET | Freq: Every day | ORAL | Status: DC
  Administered 2022-03-13 – 2022-03-19 (×7): 10 mg via ORAL
  Filled 2022-03-12 (×7): qty 1

## 2022-03-12 MED ORDER — ONDANSETRON HCL 4 MG/2ML IJ SOLN: 4.0000 mg | Freq: Four times a day (QID) | INTRAMUSCULAR | Status: DC | PRN

## 2022-03-12 NOTE — ED Notes (Signed)
ED Provider at bedside. 

## 2022-03-12 NOTE — H&P (Signed)
[place]   PATIENT NAME: Kathleen Blankenship    MR#:  [id number]  DATE OF BIRTH:  [birth year]  DATE OF ADMISSION:  03/12/2022  PRIMARY CARE PHYSICIAN: Tracie Harrier, MD   Patient is coming from: Home  REQUESTING/REFERRING PHYSICIAN: Rada Hay, MD  CHIEF COMPLAINT:   Chief Complaint  Patient presents with   Urinary Retention   Back Pain    HISTORY OF PRESENT ILLNESS:  Kathleen Blankenship is a 71 y.o. Caucasian female with medical history significant for anxiety, osteoarthritis, GERD, hypertension, tobacco abuse, coronary artery disease and previous history of PEA cardiac arrest,, who presented to the emergency room with acute onset of back pain and difficulty urinating.  Her back pain has been progressive over the last several weeks.  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  She had 30 pound unintentional weight loss in the last 6 months.  She was also having left hip pain and her back pain was radiating down her left leg.  She has been somehow weak in the left leg but denied any numbness or saddle anesthesia.  Over the last few days she has been having mild dysuria and urgency and inability to completely empty her bladder.  She has been unable to ambulate for the last couple weeks.  She had an epidural injection on 6/15.  No fever or chills.  No hematuria or flank pain.  She has been having cough productive of clear sputum without wheezing or worsening dyspnea.  No chest pain or palpitations.  No nausea or vomiting or diarrhea or abdominal pain.  No melena or bright red bleeding per rectum.  ED Course: When she came to the ER heart rate was 105 with respiratory to 26 and otherwise normal vital signs.  Labs revealed hyponatremia 130 compared to 134 on 12/07/2021, CO2 19 compared to 25 then and a glucose  of 105 with calcium of 8.2.  Alk phos was 174 compared to 143 done and albumin 2.5 compared to 2.8 then, AST 53 that was normal to obtain 5.5 close to last time and CBC showed anemia worse than previous levels with hemoglobin of 8.2 hematocrit 25.8 compared to 12/36.5 on 4/14 platelets were 498 compared to 372 then. EKG as reviewed by me : Sinus tachycardia with rate of 102 Imaging: Chest abdomen pelvic CT scan revealed: 1. Osseous metastatic disease throughout the chest, abdomen, and pelvis, as described. 2. Multiple hepatic lesions which are poorly defined on the current exam due to phase of contrast, some of which are new from prior imaging and suspicious for metastatic disease. 3. Circumferential anorectal wall thickening, also seen on prior exam, recommend direct visualization. 4. Small layering right pleural effusion. Right lower lobe airspace disease is greater than typically seen with compressive atelectasis. This may be secondary to pneumonia, however aspiration is considered given there is layering debris in the right mainstem bronchus. No obvious pulmonary mass. 5. Choledocholithiasis with 7 mm stone in the distal common bile duct. Mild biliary ductal dilatation. 6. Moderate hiatal hernia with dilated esophagus and intraluminal debris to the level of the thoracic inlet. 7. Colonic diverticulosis without diverticulitis. 8. 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 9. Displaced left femoral neck fracture, presumably pathologic. Possible pathologic fracture of the right superior puboacetabular junction, possible pathologic fracture of  the sternum. 10. Generalized body wall edema. 11.  Aortic Atherosclerosis and emphysema.  The patient was given 0.5 mg of IV Dilaudid, 4 mg of IV morphine sulfate and 1 L bolus of IV normal saline.  Dr. Mack Guise was notified about the patient and is aware.  She will be admitted to a medical bed for further  evaluation and management. PAST MEDICAL HISTORY:   Past Medical History:  Diagnosis Date   Anginal pain ([place])    Anxiety    Arthritis    RA   Cancer ([place])    Complication of anesthesia 12/06/2021   PEA Cardiac Arrest with ROSC after 1 round of chest compressions and 1 mg epinephrine. Infiltrated IV found after rescusitative attempts were made.   Coronary artery disease    GERD (gastroesophageal reflux disease)    Hypertension    Pneumonia     PAST SURGICAL HISTORY:   Past Surgical History:  Procedure Laterality Date   CARDIAC CATHETERIZATION     CORONARY/GRAFT ACUTE MI REVASCULARIZATION N/A 05/28/2017   Procedure: Coronary/Graft Acute MI Revascularization;  Surgeon: Isaias Cowman, MD;  Location: [place] [facility];  Service: Cardiovascular;  Laterality: N/A;   ERCP N/A 11/22/2021   Procedure: ENDOSCOPIC RETROGRADE CHOLANGIOPANCREATOGRAPHY (ERCP);  Surgeon: Lucilla Lame, MD;  Location: [facility] ENDOSCOPY;  Service: Endoscopy;  Laterality: N/A;   LEFT HEART CATH AND CORONARY ANGIOGRAPHY N/A 05/28/2017   Procedure: LEFT HEART CATH AND CORONARY ANGIOGRAPHY;  Surgeon: Isaias Cowman, MD;  Location: [place] [facility];  Service: Cardiovascular;  Laterality: N/A;   ORIF ELBOW FRACTURE Right 07/26/2020   Procedure: OPEN REDUCTION INTERNAL FIXATION (ORIF) ELBOW/OLECRANON FRACTURE;  Surgeon: Shona Needles, MD;  Location: [place];  Service: Orthopedics;  Laterality: Right;   REVERSE SHOULDER ARTHROPLASTY Left 07/29/2020   Procedure: REVERSE SHOULDER ARTHROPLASTY;  Surgeon: Hiram Gash, MD;  Location: [place];  Service: Orthopedics;  Laterality: Left;   THYROID LOBECTOMY      SOCIAL HISTORY:   Social History   Tobacco Use   Smoking status: Every Day    Packs/day: 1.00    Years: 37.00    Total Blankenship years: 37.00    Types: Cigarettes   Smokeless tobacco: Never  Substance Use Topics   Alcohol use: No    FAMILY HISTORY:   Family History  Family history unknown: Yes     DRUG ALLERGIES:   Allergies  Allergen Reactions   Codeine Hives   Methotrexate Nausea Only   Remicade [Infliximab] Other (See Comments)    Chest felt heavy, was given benadryl.    REVIEW OF SYSTEMS:   ROS As per history of present illness. All pertinent systems were reviewed above. Constitutional, HEENT, cardiovascular, respiratory, GI, GU, musculoskeletal, neuro, psychiatric, endocrine, integumentary and hematologic systems were reviewed and are otherwise negative/unremarkable except for positive findings mentioned above in the HPI.   MEDICATIONS AT HOME:   Prior to Admission medications   Medication Sig Start Date End Date Taking? Authorizing Provider  alendronate (FOSAMAX) 70 MG tablet Take 70 mg by mouth once a week. Sunday 09/27/21  Yes [provider]  atorvastatin (LIPITOR) 10 MG tablet Take 10 mg by mouth daily. 06/26/20  Yes [provider]  Cholecalciferol (VITAMIN D) 125 MCG (5000 UT) CAPS Take 5,000 Units by mouth daily.   Yes [provider]  gabapentin (NEURONTIN) 300 MG capsule Take 300 mg by mouth 3 (three) times daily. 02/27/22  Yes [provider]  leflunomide (ARAVA) 10 MG tablet Take 10 mg  by mouth daily. 02/27/22  Yes [provider]  losartan (COZAAR) 50 MG tablet Take 50 mg by mouth daily.   Yes [provider]  metoprolol succinate (TOPROL-XL) 50 MG 24 hr tablet Take 50 mg by mouth daily. 05/08/20  Yes [provider]  pantoprazole (PROTONIX) 40 MG tablet Take 40 mg by mouth daily. 09/10/21  Yes [provider]  sertraline (ZOLOFT) 50 MG tablet Take 50 mg by mouth at bedtime.   Yes [provider]  Upadacitinib ER (RINVOQ) 15 MG TB24 Take 15 mg by mouth daily.   Yes [provider]  albuterol (PROVENTIL HFA;VENTOLIN HFA) 108 (90 Base) MCG/ACT inhaler Inhale 2 puffs into the lungs every 6 (six) hours as needed for wheezing or shortness of breath. 05/29/17   Epifanio Lesches,  MD  cyclobenzaprine (FLEXERIL) 10 MG tablet Take 5-10 mg by mouth at bedtime as needed. 02/19/22   [provider]  diazepam (VALIUM) 5 MG tablet SMARTSIG:1 Tablet(s) By Mouth Patient not taking: Reported on 03/12/2022 02/06/22   [provider]  HYDROcodone-acetaminophen (NORCO) 7.5-325 MG tablet Take 1 tablet by mouth 2 (two) times daily as needed. 02/27/22   [provider]  traMADol (ULTRAM) 50 MG tablet Take 1 tablet (50 mg total) by mouth every 6 (six) hours as needed for moderate pain or severe pain (for pain not controlled by tylenol). 12/07/21   Benjamine Sprague, DO      VITAL SIGNS:  Blood pressure 106/71, pulse (!) 103, temperature 98.4 F (36.9 C), temperature source Oral, resp. rate 19, height 5' 1"  (1.549 m), weight 51 kg, SpO2 95 %.  PHYSICAL EXAMINATION:  Physical Exam  GENERAL:  71 y.o.-year-old patient lying in the bed with no acute distress.  EYES: Pupils equal, round, reactive to light and accommodation. No scleral icterus. Extraocular muscles intact.  HEENT: Head atraumatic, normocephalic. Oropharynx and nasopharynx clear.  NECK:  Supple, no jugular venous distention. No thyroid enlargement, no tenderness.  LUNGS: Slightly diminished bibasilar breath sounds.  No wheezing, rales,rhonchi or crepitation. No use of accessory muscles of respiration.  CARDIOVASCULAR: Regular rate and rhythm, S1, S2 normal. No murmurs, rubs, or gallops.  ABDOMEN: Soft, nondistended, nontender. Bowel sounds present. No organomegaly or mass.  EXTREMITIES: No pedal edema, cyanosis, or clubbing. Musculoskeletal: Left hip tenderness NEUROLOGIC: Cranial nerves II through XII are intact. Muscle strength 5/5 in all extremities. Sensation intact. Gait not checked.  PSYCHIATRIC: The patient is alert and oriented x 3.  Normal affect and good eye contact. SKIN: No obvious rash, lesion, or ulcer.   LABORATORY PANEL:   CBC Recent Labs  Lab 03/12/22 2025  WBC 9.6  HGB 8.2*  HCT  25.8*  PLT 498*   ------------------------------------------------------------------------------------------------------------------  Chemistries  Recent Labs  Lab 03/12/22 2025  NA 130*  K 4.4  CL 100  CO2 19*  GLUCOSE 105*  BUN 20  CREATININE 0.60  CALCIUM 8.2*  AST 53*  ALT 21  ALKPHOS 174*  BILITOT 1.0   ------------------------------------------------------------------------------------------------------------------  Cardiac Enzymes No results for input(s): "TROPONINI" in the last 168 hours. ------------------------------------------------------------------------------------------------------------------  RADIOLOGY:  CT CHEST ABDOMEN PELVIS W CONTRAST  Result Date: 03/12/2022 CLINICAL DATA:  Metastatic disease evaluation. Osseous metastatic disease of the thoracic and lumbar spine. EXAM: CT CHEST, ABDOMEN, AND PELVIS WITH CONTRAST TECHNIQUE: Multidetector CT imaging of the chest, abdomen and pelvis was performed following the standard protocol during bolus administration of intravenous contrast. RADIATION DOSE REDUCTION: This exam was performed according to the departmental dose-optimization program  which includes automated exposure control, adjustment of the mA and/or kV according to patient size and/or use of iterative reconstruction technique. CONTRAST:  71m OMNIPAQUE IOHEXOL 300 MG/ML  SOLN COMPARISON:  Thoracic and lumbar spine MRI yesterday. Chest abdomen pelvis CT 12/06/2021 FINDINGS: CT CHEST FINDINGS Cardiovascular: Aortic atherosclerosis. Aortic tortuosity. The heart is normal in size. There are coronary artery calcifications. No pericardial effusion. Mediastinum/Nodes: Moderate-sized hiatal hernia. The esophagus is dilated and contains intraluminal debris to the level of the thoracic inlet. 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 The right thyroid gland may be surgically absent. No definite mediastinal or hilar adenopathy. Right  axillary node measures 9 mm series 2, image 18. Lungs/Pleura: Small layering right pleural effusion. No convincing pleural nodularity or enhancement. Right lower lobe airspace disease is greater than typically seen with compressive atelectasis. Mild emphysema. Tiny 3 mm left lower lobe nodule series 4, image 84. This was previously obscured. Previous calcified right lower lobe nodule is again seen, surrounded by adjacent atelectatic lung. Central bronchial thickening. There is layering debris/mucus within the distal trachea and right mainstem bronchus. Mild dependent atelectasis in the left lower lobe. Musculoskeletal: Multiple healing anterior rib fractures typically seen with CPR. Many of these demonstrate delayed union. Sternal body fracture may be pathologic with adjacent sclerosis. Known thoracic osseous metastatic disease was assessed on thoracic spine MRI yesterday. Pathologic T4 and T10 vertebral body compression fractures. Some of the lesions on MRI are visualized with lytic lesions, occasional areas of cortical sclerosis involving T3 and T4. There multiple small lucent rib lesions. The right subareolar breast tissue appears slightly lobulated, but is similar in appearance to prior exam. There is generalized body wall edema. Left shoulder arthroplasty. CT ABDOMEN PELVIS FINDINGS Hepatobiliary: Patient had numerous liver lesions on spine MRI. These lesions are difficult to visualize on the current exam, however there is a 2.8 cm lesion in the right hepatic dome. Multiple additional low-density lesions scattered throughout the liver, difficult to assess due to phase of contrast. Some of these lesions were previously characterized as hemangioma on MRI 11/02/2021, however some of these lesions are definitely new. Prior cholecystectomy. Common bile duct measures 9 mm proximally, 10 mm distally. There is a 7 mm stone in the distal common bile duct, series 5, image 58. There is slight central intrahepatic biliary  ductal dilatation. Pancreas: 7 mm low-density in the pancreatic head, also seen on prior MRI, characterized as cyst. No evidence of suspicious pancreatic lesion. There is slight proximal ductal prominence of 4 mm. No peripancreatic inflammation. Spleen: Normal in size without focal abnormality. Adrenals/Urinary Tract: No adrenal nodule. Mild left renal atrophy. Tiny hypodensities in the renal cortices are too small to characterize but likely small cysts. No solid renal lesion. No hydronephrosis. No bladder wall thickening. No dense of bladder lesion. Stomach/Bowel: Bowel assessment is limited in the absence of enteric contrast. There is a moderate hiatal hernia. Mild gastric wall hyperemia. No small bowel obstruction or inflammation. Normal appendix. Multifocal colonic diverticulosis. Stool distends the rectum. Circumferential anorectal thickening again seen, series 2, image 107. Vascular/Lymphatic: Aortic atherosclerosis. No aortic aneurysm. No definite abdominopelvic adenopathy. Reproductive: Atrophic uterus is tentatively visualized. No evidence of adnexal mass. Other: There is generalized edema of the subcutaneous and intra-abdominal fat. No convincing abdominopelvic ascites. No omental thickening. Musculoskeletal: There is a displaced left femoral neck fracture, presumably pathologic. Possible pathologic fracture of the right superior puboacetabular junction innumerable osseous metastasis throughout the pelvis, multiple areas of low-density as well  as trabecular coarsening. Lumbar metastatic disease better assessed on lumbar spine yesterday remote L5 compression fracture. IMPRESSION: 1. Osseous metastatic disease throughout the chest, abdomen, and pelvis, as described. 2. Multiple hepatic lesions which are poorly defined on the current exam due to phase of contrast, some of which are new from prior imaging and suspicious for metastatic disease. 3. Circumferential anorectal wall thickening, also seen on prior  exam, recommend direct visualization. 4. Small layering right pleural effusion. Right lower lobe airspace disease is greater than typically seen with compressive atelectasis. This may be secondary to pneumonia, however aspiration is considered given there is layering debris in the right mainstem bronchus. No obvious pulmonary mass. 5. Choledocholithiasis with 7 mm stone in the distal common bile duct. Mild biliary ductal dilatation. 6. Moderate hiatal hernia with dilated esophagus and intraluminal debris to the level of the thoracic inlet. 7. Colonic diverticulosis without diverticulitis. 8. 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 9. Displaced left femoral neck fracture, presumably pathologic. Possible pathologic fracture of the right superior puboacetabular junction, possible pathologic fracture of the sternum. 10. Generalized body wall edema. Aortic Atherosclerosis (ICD10-I70.0) and Emphysema (ICD10-J43.9). Electronically Signed   By: Keith Rake M.D.   On: 03/12/2022 22:10   DG Chest Portable 1 View  Result Date: 03/12/2022 CLINICAL DATA:  Shortness of breath. EXAM: PORTABLE CHEST 1 VIEW COMPARISON:  Chest radiograph dated 12/07/2021. FINDINGS: Small right pleural effusion and left lung base atelectasis or infiltrate. Probable left lung base atelectasis/scarring. No pneumothorax. The cardiac silhouette is within limits. No acute osseous pathology. Osteopenia. Left shoulder arthroplasty. IMPRESSION: Probable small right pleural effusion and right lung base atelectasis or infiltrate. Electronically Signed   By: Anner Crete M.D.   On: 03/12/2022 21:14   MR THORACIC SPINE WO CONTRAST  Result Date: 03/12/2022 CLINICAL DATA:  Mid and lower back pain. EXAM: MRI THORACIC AND LUMBAR SPINE WITHOUT CONTRAST TECHNIQUE: Multiplanar and multiecho pulse sequences of the thoracic and lumbar spine were obtained without intravenous contrast. COMPARISON:  CT chest abdomen  pelvis April 13, 23. MRI February 12, 23. FINDINGS: MRI THORACIC SPINE FINDINGS Alignment:  No substantial sagittal subluxation Vertebrae: Extensive/numerous T1 hypointense and STIR hyperintense lesions throughout the visualized lower cervical and thoracic spine, compatible with osseous metastatic disease. Lesions involve both the vertebral bodies as well as the posterior elements. Complete replacement of the bone marrow at T3 and T4. Height loss of the T4 and T10 vertebral bodies is compatible with pathologic fractures, new since CT on April 13, 23. Cord:  Normal cord signal. Paraspinal and other soft tissues: Layering right pleural effusion. Suspected dilated, fluid-filled esophagus. Disc levels: Poor definition of bone marrow cortex at T3 and T4 where there is complete marrow replacement, suggesting possible extraosseous extension of tumor although evaluation is limited without contrast. MRI LUMBAR SPINE FINDINGS Segmentation:  Standard. Alignment: Grade 1 retrolisthesis of L1 on L2 and L2 on L3 and grade 1 anterolisthesis of L5 on S1. Vertebrae: Numerous T1 hypointense and STIR hyperintense lesions throughout the visualized lumbar spine and sacrum, concerning for osseous metastatic disease. Remote L5 compression fracture with similar height loss. Conus medullaris and cauda equina: Conus extends to the T12 level. Conus appears normal. Paraspinal and other soft tissues: Limited intra-abdominal evaluation due to motion/artifact. Numerous hepatic lesions on the coronal T2 image. Disc levels: T12-L1: No significant disc protrusion, foraminal stenosis, or canal stenosis. L1-L2: Posterior disc osteophyte complex and facet arthropathy. Moderate right foraminal stenosis. Right subarticular stenosis with otherwise  patent canal. L2-L3: Disc height loss. Disc bulging and endplate spurring. Similar moderate right and mild left foraminal stenosis with out significant canal stenosis. L3-L4: Right eccentric disc bulging with  bilateral facet arthropathy. Similar moderate to severe canal stenosis with severe bilateral subarticular recess stenosis. Similar mild-to-moderate right foraminal stenosis. L4-L5: Mild disc bulging bilateral facet arthropathy. Moderate left and mild right foraminal stenosis, similar. L5-S1: Facet arthropathy and mild disc bulging. Mild left foraminal stenosis, similar. IMPRESSION: 1. Findings compatible with extensive osseous metastatic disease throughout the visualized cervical, thoracic, and lumbar spine, as well as the sacrum. 2. Pathologic fractures of T4 and T10, new since CT on December 06 2021. Similar height loss of an L5 compression fracture. 3. Poor definition of bone marrow cortex at T3 and T4 where there is complete marrow replacement, suggesting possible extraosseous extension of tumor although evaluation is limited without contrast. Postcontrast imaging could further characterize if clinically warranted. 4. Multilevel degenerative change, greatest in the lumbar spine with similar moderate to severe canal stenosis at L3-L4 and multilevel foraminal stenosis, detailed above. 5. Partially imaged numerous hepatic lesions, suspicious for metastatic disease given above findings. Also, layering right pleural effusion and suspected dilated fluid-filled esophagus. Recommend dedicated chest/abdominal/pelvic imaging to further evaluate disease burden and better evaluate for suspected primary malignancy. These results will be called to the ordering clinician or representative by the Radiologist Assistant, and communication documented in the PACS or [employer]. Electronically Signed   By: Margaretha Sheffield M.D.   On: 03/12/2022 13:57   MR LUMBAR SPINE WO CONTRAST  Result Date: 03/12/2022 CLINICAL DATA:  Mid and lower back pain. EXAM: MRI THORACIC AND LUMBAR SPINE WITHOUT CONTRAST TECHNIQUE: Multiplanar and multiecho pulse sequences of the thoracic and lumbar spine were obtained without intravenous contrast.  COMPARISON:  CT chest abdomen pelvis April 13, 23. MRI February 12, 23. FINDINGS: MRI THORACIC SPINE FINDINGS Alignment:  No substantial sagittal subluxation Vertebrae: Extensive/numerous T1 hypointense and STIR hyperintense lesions throughout the visualized lower cervical and thoracic spine, compatible with osseous metastatic disease. Lesions involve both the vertebral bodies as well as the posterior elements. Complete replacement of the bone marrow at T3 and T4. Height loss of the T4 and T10 vertebral bodies is compatible with pathologic fractures, new since CT on April 13, 23. Cord:  Normal cord signal. Paraspinal and other soft tissues: Layering right pleural effusion. Suspected dilated, fluid-filled esophagus. Disc levels: Poor definition of bone marrow cortex at T3 and T4 where there is complete marrow replacement, suggesting possible extraosseous extension of tumor although evaluation is limited without contrast. MRI LUMBAR SPINE FINDINGS Segmentation:  Standard. Alignment: Grade 1 retrolisthesis of L1 on L2 and L2 on L3 and grade 1 anterolisthesis of L5 on S1. Vertebrae: Numerous T1 hypointense and STIR hyperintense lesions throughout the visualized lumbar spine and sacrum, concerning for osseous metastatic disease. Remote L5 compression fracture with similar height loss. Conus medullaris and cauda equina: Conus extends to the T12 level. Conus appears normal. Paraspinal and other soft tissues: Limited intra-abdominal evaluation due to motion/artifact. Numerous hepatic lesions on the coronal T2 image. Disc levels: T12-L1: No significant disc protrusion, foraminal stenosis, or canal stenosis. L1-L2: Posterior disc osteophyte complex and facet arthropathy. Moderate right foraminal stenosis. Right subarticular stenosis with otherwise patent canal. L2-L3: Disc height loss. Disc bulging and endplate spurring. Similar moderate right and mild left foraminal stenosis with out significant canal stenosis. L3-L4: Right  eccentric disc bulging with bilateral facet arthropathy. Similar moderate to severe canal stenosis with severe  bilateral subarticular recess stenosis. Similar mild-to-moderate right foraminal stenosis. L4-L5: Mild disc bulging bilateral facet arthropathy. Moderate left and mild right foraminal stenosis, similar. L5-S1: Facet arthropathy and mild disc bulging. Mild left foraminal stenosis, similar. IMPRESSION: 1. Findings compatible with extensive osseous metastatic disease throughout the visualized cervical, thoracic, and lumbar spine, as well as the sacrum. 2. Pathologic fractures of T4 and T10, new since CT on December 06 2021. Similar height loss of an L5 compression fracture. 3. Poor definition of bone marrow cortex at T3 and T4 where there is complete marrow replacement, suggesting possible extraosseous extension of tumor although evaluation is limited without contrast. Postcontrast imaging could further characterize if clinically warranted. 4. Multilevel degenerative change, greatest in the lumbar spine with similar moderate to severe canal stenosis at L3-L4 and multilevel foraminal stenosis, detailed above. 5. Partially imaged numerous hepatic lesions, suspicious for metastatic disease given above findings. Also, layering right pleural effusion and suspected dilated fluid-filled esophagus. Recommend dedicated chest/abdominal/pelvic imaging to further evaluate disease burden and better evaluate for suspected primary malignancy. These results will be called to the ordering clinician or representative by the Radiologist Assistant, and communication documented in the PACS or [employer]. Electronically Signed   By: Margaretha Sheffield M.D.   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 - The patient will be admitted to a medical bed. -Pain management to be provided. - Orthopedic consultation will be obtained. - Dr. Mack Guise was notified about the patient and will await oncology consult. - Oncology consult will be obtained. - Dr. Janese Banks was notified about the patient.  Dyslipidemia - We will continue statin therapy  Pleural effusion on right - This could be associated with right lower lobe airspace disease/pneumonia. - She does have cough however without fever or leukocytosis and she is a chronic smoker.  I will still hold off at this time on antibiotics.  GERD (gastroesophageal reflux disease) Continue PPI therapy.  Anxiety and depression We will continue Valium and Zoloft.  Essential hypertension We will continue Cozaar and Toprol-XL.  Choledocholithiasis - This is currently asymptomatic  Rheumatoid arthritis ([place]) - The patient is on Rinvoq and leflunomide.    DVT prophylaxis: Lovenox.  Advanced Care Planning:  Code Status: DNR/DNI.  This was discussed with her and the family.   Family Communication:  The plan of care was discussed in details with the patient (and family). I answered all questions. The patient agreed to proceed with the above mentioned plan. Further management will depend upon hospital course. Disposition Plan: Back to previous home environment Consults called: Oncology and orthopedic surgery. All the records are reviewed and case discussed with ED provider.  Status is: Inpatient   At the time of the admission, it appears that the appropriate admission status for this patient is inpatient.  This is judged to be reasonable and necessary in order to provide the required intensity of service to ensure the patient's safety given the presenting symptoms, physical exam findings and initial radiographic and laboratory data in the context of comorbid conditions.  The patient requires inpatient status  due to high intensity of service, high risk of further deterioration and high frequency of surveillance required.  I certify that at the time of admission, it is my clinical judgment  that the patient will require inpatient hospital care extending more than 2 midnights.                            Dispo: The patient is from: Home              Anticipated d/c is to: Home              Patient currently is not medically stable to d/c.              Difficult to place patient: No  Christel Mormon M.D on 03/13/2022 at 12:47 AM  [facility]   From 7 PM-7 AM, contact night-coverage [email or web address]  CC: Primary care physician; Tracie Harrier, MD

## 2022-03-12 NOTE — ED Provider Notes (Signed)
[facility] Provider Note    Event Date/Time   First MD Initiated Contact with Patient 03/12/22 2017     (approximate)   History   Urinary Retention and Back Pain   HPI  Kathleen Blankenship is a 71 y.o. female past medical history rheumatoid arthritis, GERD, coronary disease, hypertension, chronic tobacco use presents with back pain difficulty urinating.  Patient has been having progressive back pain for the last several weeks.  Had an MRI as an outpatient of the thoracic and lumbar spine which showed extensive osseous metastatic disease throughout the visualized cervical thoracic and lumbar spine with pathologic fractures at T4 and T10 height loss of L5.  She has had about 30 pound unintentional weight loss in the last 6 months.  She complains of low back pain and hip pain.  Pain is worse on the left low back rating down the left leg.  Does feel somewhat weak in the left leg denies numbness no saddle anesthesia.  Has had some difficulty urinating over the last several days with feeling sensation that she needs to go and then just dribbling and not emptying the bladder completely.  Denies fevers chills.  Has chronic cough productive of clear sputum.  No chest pain or abdominal pain no nausea vomiting diarrhea     Past Medical History:  Diagnosis Date   Anginal pain ([place])    Anxiety    Arthritis    RA   Cancer ([place])    Complication of anesthesia 12/06/2021   PEA Cardiac Arrest with ROSC after 1 round of chest compressions and 1 mg epinephrine. Infiltrated IV found after rescusitative attempts were made.   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 03/12/2022   Cardiac arrest ([place]) 12/06/2021   Choledocholithiasis    Closed fracture dislocation of right elbow joint, initial encounter 07/26/2020    CAD (coronary artery disease) 07/26/2020   Rheumatoid arthritis ([place]) 07/26/2020   Closed fracture dislocation of right elbow 07/26/2020   Closed fracture of left proximal humerus 07/26/2020   Chest pain on exertion 05/28/2017   Chest pain 05/28/2017     Physical Exam  Triage Vital Signs: ED Triage Vitals  Enc Vitals Group     BP 03/12/22 2025 114/78     Pulse Rate 03/12/22 2025 (!) 105     Resp 03/12/22 2025 (!) 26     Temp 03/12/22 2025 98.4 F (36.9 C)     Temp Source 03/12/22 2025 Oral     SpO2 03/12/22 2025 95 %     Weight 03/12/22 2027 112 lb 7 oz (51 kg)     Height 03/12/22 2027 5' 1" (1.549 m)     Head Circumference --      Peak Flow --      Pain Score --      Pain Loc --      Pain Edu? --      Excl. in [place]? --     Most recent vital signs: Vitals:   03/12/22 2100 03/12/22 2130  BP: 112/70 106/71  Pulse: (!) 102 (!) 103  Resp: 19 19  Temp:    SpO2: 95% 95%     General: Awake, appears chronically ill CV:  Good peripheral perfusion. No edema Resp:  Normal effort.  No increased  work of breathing Abd:  No distention.  Soft nontender Neuro:             Awake, Alert, Oriented x 3  Other:  5 out of 5 strength with plantarflexion and dorsiflexion, hip flexion limited secondary to pain   ED Results / Procedures / Treatments  Labs (all labs ordered are listed, but only abnormal results are displayed) Labs Reviewed  CBC WITH DIFFERENTIAL/PLATELET - Abnormal; Notable for the following components:      Result Value   RBC 2.78 (*)    Hemoglobin 8.2 (*)    HCT 25.8 (*)    RDW 19.7 (*)    Platelets 498 (*)    nRBC 0.4 (*)    Lymphs Abs 0.5 (*)    Monocytes Absolute 1.4 (*)    All other components within normal limits  COMPREHENSIVE METABOLIC PANEL - Abnormal; Notable for the following components:   Sodium 130 (*)    CO2 19 (*)    Glucose, Bld 105 (*)    Calcium 8.2 (*)    Total Protein 5.5 (*)    Albumin 2.5 (*)    AST 53 (*)    Alkaline Phosphatase 174  (*)    All other components within normal limits  CULTURE, BLOOD (ROUTINE X 2)  CULTURE, BLOOD (ROUTINE X 2)  LACTIC ACID, PLASMA  LACTIC ACID, PLASMA  URINALYSIS, ROUTINE W REFLEX MICROSCOPIC     EKG  EKG reviewed interpreted by myself shows sinus tachycardia normal axis normal intervals no acute ischemic changes   RADIOLOGY CT chest and pelvis reviewed by myself and interpreted shows multiple osseous lesions including left femoral neck fracture   PROCEDURES:  Critical Care performed: No  .1-3 Lead EKG Interpretation  Performed by: Rada Hay, MD Authorized by: Rada Hay, MD     Interpretation: abnormal     ECG rate assessment: tachycardic     Rhythm: sinus tachycardia     Ectopy: none     Conduction: normal     The patient is on the cardiac monitor to evaluate for evidence of arrhythmia and/or significant heart rate changes.   MEDICATIONS ORDERED IN ED: Medications  sodium chloride 0.9 % bolus 1,000 mL (1,000 mLs Intravenous New Bag/Given 03/12/22 2052)  morphine (PF) 4 MG/ML injection 4 mg (4 mg Intravenous Given 03/12/22 2054)  iohexol (OMNIPAQUE) 300 MG/ML solution 75 mL (75 mLs Intravenous Contrast Given 03/12/22 2136)  HYDROmorphone (DILAUDID) injection 0.5 mg (0.5 mg Intravenous Given 03/12/22 2219)     IMPRESSION / MDM / ASSESSMENT AND PLAN / ED COURSE  I reviewed the triage vital signs and the nursing notes.                              Patient's presentation is most consistent with acute presentation with potential threat to life or bodily function.  Differential diagnosis includes, but is not limited to, metastatic disease secondary to primary lung cancer thyroid cancer, breast cancer, multiple myeloma   The patient is a 71 year old female presents with back pain left hip pain difficulty ambulating.  She has had progressive pain in the left hip and back for some time now unable to ambulate.  She has been seeing outpatient physiatrist  who ordered an MRI of her T and L-spine which showed diffuse metastatic disease.  She has had several episodes where she slid out of the chair but no significant fall or trauma that seemed  to precede her pain.  She has not had any fevers or chills.  Does have some urinary incontinence and difficulty initiating her stream denies numbness or saddle anesthesia.  On exam she is mildly tachycardic blood pressure borderline low.  Appears chronically ill.  She has normal strength with plantarflexion dorsiflexion pain, pain with hip flexion is limited due to pain on the left.  I reviewed the MRI performed recently there is no evidence of cauda equina syndrome patient is able to urinate here.  She was given morphine and Dilaudid for pain control.  CT chest abdomen pelvis reveals diffuse metastatic disease there is a likely pathologic displaced left femoral neck fracture and possible sternal body fracture likely pathologic.  Multiple healing rib fractures likely from recent CPR.  Discussed with Dr. Mack Guise with orthopedics who notes that it will need to be discussed with oncology to determine patient's life expectancy whether she is a surgical candidate or not.  Patient's labs are notable for anemia hemoglobin around 8 stool somewhat dark but guaiac negative.  Creatinine is normal.  CMP otherwise notable for mild hyponatremia bicarb of 19 and elevated alk phos to 174.  Of note CT also shows choledocholithiasis but this is a known finding which patient had on her CT 3 months ago when she had her gallbladder removed and coded afterwards.  Plan was for an elective ERCP as an outpatient.  Patient does not have any any symptoms including abdominal pain vomiting or fevers related to this.  She is not displaying signs of cholangitis do not feel that this needs to be handled during this admission.  I have talked to the hospitalist who accept the patient for admission.     FINAL CLINICAL IMPRESSION(S) / ED DIAGNOSES   Final  diagnoses:  Pathological fracture of neck of left femur, initial encounter [facility])  Metastatic cancer to bone [facility])     Rx / DC Orders   ED Discharge Orders     None        Note:  This document was prepared using Dragon voice recognition software and may include unintentional dictation errors.   Rada Hay, MD 03/12/22 2340

## 2022-03-12 NOTE — ED Triage Notes (Signed)
EMS was called for a "sick" pt. Pt was dx with cancerous tumors on the spine after an MRI yesterday. Pt is on immunosuppresants for RA. Pt also c/o urinary retention. Pt is afebrile at this time.

## 2022-03-12 NOTE — H&P (Incomplete)
[place]   PATIENT NAME: Kathleen Blankenship    MR#:  [id number]  DATE OF BIRTH:  1950/09/05  DATE OF ADMISSION:  03/12/2022  PRIMARY CARE PHYSICIAN: Kathleen Harrier, MD   Patient is coming from: Home  REQUESTING/REFERRING PHYSICIAN: Rada Hay, MD  CHIEF COMPLAINT:   Chief Complaint  Patient presents with  . Urinary Retention  . Back Pain    HISTORY OF PRESENT ILLNESS:  Kathleen Blankenship is a 71 y.o. female with medical history significant for anxiety, osteoarthritis, GERD, hypertension, tobacco abuse, coronary artery disease and previous history of PEA cardiac arrest,, who presented to the emergency room with acute onset of back pain and difficulty urinating.  Her back pain has been progressive over the last several weeks.  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  She had 30 pound unintentional weight loss in the last 6 months.  She was also having left hip pain and her back pain was radiating down her left leg.  She has been somehow weak in the left leg but denied any numbness or saddle anesthesia.  Over the last few days she has been having mild dysuria and urgency and inability to completely empty her bladder.  No fever or chills.  No hematuria or flank pain.  She has been having cough productive of clear sputum without wheezing or worsening dyspnea.  No chest pain or palpitations.  No nausea or vomiting or diarrhea or abdominal pain.  No melena or bright red bleeding per rectum.  ED Course: When she came to the ER heart rate was 105 with respiratory to 26 and otherwise normal vital signs.  Labs revealed hyponatremia 130 compared to 134 on 12/07/2021, CO2 19 compared to 25 then and a glucose of 105 with calcium of 8.2.  Alk phos was 174 compared to 143 done and albumin 2.5 compared to 2.8 then, AST  53 that was normal to obtain 5.5 close to last time and CBC showed anemia worse than previous levels with hemoglobin of 8.2 hematocrit 25.8 compared to 12/36.5 on 4/14 platelets were 498 compared to 372 then. EKG as reviewed by me : *** Imaging: Chest abdomen pelvic CT scan revealed: 1. Osseous metastatic disease throughout the chest, abdomen, and pelvis, as described. 2. Multiple hepatic lesions which are poorly defined on the current exam due to phase of contrast, some of which are new from prior imaging and suspicious for metastatic disease. 3. Circumferential anorectal wall thickening, also seen on prior exam, recommend direct visualization. 4. Small layering right pleural effusion. Right lower lobe airspace disease is greater than typically seen with compressive atelectasis. This may be secondary to pneumonia, however aspiration is considered given there is layering debris in the right mainstem bronchus. No obvious pulmonary mass. 5. Choledocholithiasis with 7 mm stone in the distal common bile duct. Mild biliary ductal dilatation. 6. Moderate hiatal hernia with dilated esophagus and intraluminal debris to the level of the thoracic inlet. 7. Colonic diverticulosis without diverticulitis. 8. 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 9. Displaced left femoral neck fracture, presumably pathologic. Possible pathologic fracture of the right superior puboacetabular junction, possible pathologic fracture of the sternum. 10. Generalized body wall edema. 11.  Aortic Atherosclerosis and emphysema.  The patient was given 0.5 mg of IV Dilaudid, 4 mg of  IV morphine sulfate and 1 L bolus of IV normal saline.  She will be admitted to a medical bed for further evaluation and management. PAST MEDICAL HISTORY:   Past Medical History:  Diagnosis Date  . Anginal pain ([place])   . Anxiety   . Arthritis    RA  . Cancer ([place])   . Complication of anesthesia  12/06/2021   PEA Cardiac Arrest with ROSC after 1 round of chest compressions and 1 mg epinephrine. Infiltrated IV found after rescusitative attempts were made.  . Coronary artery disease   . GERD (gastroesophageal reflux disease)   . Hypertension   . Pneumonia     PAST SURGICAL HISTORY:   Past Surgical History:  Procedure Laterality Date  . CARDIAC CATHETERIZATION    . CORONARY/GRAFT ACUTE MI REVASCULARIZATION N/A 05/28/2017   Procedure: Coronary/Graft Acute MI Revascularization;  Surgeon: Isaias Cowman, MD;  Location: [facility];  Service: Cardiovascular;  Laterality: N/A;  . ERCP N/A 11/22/2021   Procedure: ENDOSCOPIC RETROGRADE CHOLANGIOPANCREATOGRAPHY (ERCP);  Surgeon: Lucilla Lame, MD;  Location[facility] END[facility]COPY;  Service: Endoscopy;  Laterality: N/A;  . LEFT HEART CATH AND CORONARY ANGIOGRAPHY N/A 05/28/2017   Procedure: LEFT HEART CATH AND CORONARY ANGIOGRAPHY;  Surgeon: Isaias Cowman, MD;  Location: [facility];  Service: Cardiovascular;  Laterality: N/A;  . ORIF ELBOW FRACTURE Right 07/26/2020   Procedure: OPEN REDUCTION INTERNAL FIXATION (ORIF) ELBOW/OLECRANON FRACTURE;  Surgeon: Shona Needles, MD;  Location: [facility];  Service: Orthopedics;  Laterality: Right;  . REVERSE SHOULDER ARTHROPLASTY Left 07/29/2020   Procedure: REVERSE SHOULDER ARTHROPLASTY;  Surgeon: Hiram Gash, MD;  Location: [facility];  Service: Orthopedics;  Laterality: Left;  . THYROID LOBECTOMY      SOCIAL HISTORY:   Social History   Tobacco Use  . Smoking status: Every Day    Packs/day: 1.00    Years: 37.00    Total pack years: 37.00    Types: Cigarettes  . Smokeless tobacco: Never  Substance Use Topics  . Alcohol use: No    FAMILY HISTORY:   Family History  Family history unknown: Yes    DRUG ALLERGIES:   Allergies  Allergen Reactions  . Codeine Hives  . Methotrexate Nausea Only  . Remicade [Infliximab] Other (See Comments)    Chest felt heavy, was given  benadryl.    REVIEW OF SYSTEMS:   ROS As per history of present illness. All pertinent systems were reviewed above. Constitutional, HEENT, cardiovascular, respiratory, GI, GU, musculoskeletal, neuro, psychiatric, endocrine, integumentary and hematologic systems were reviewed and are otherwise negative/unremarkable except for positive findings mentioned above in the HPI.   MEDICATIONS AT HOME:   Prior to Admission medications   Medication Sig Start Date End Date Taking? Authorizing Provider  alendronate (FOSAMAX) 70 MG tablet Take 70 mg by mouth once a week. Sunday 09/27/21  Yes [provider]  atorvastatin (LIPITOR) 10 MG tablet Take 10 mg by mouth daily. 06/26/20  Yes [provider]  Cholecalciferol (VITAMIN D) 125 MCG (5000 UT) CAPS Take 5,000 Units by mouth daily.   Yes [provider]  gabapentin (NEURONTIN) 300 MG capsule Take 300 mg by mouth 3 (three) times daily. 02/27/22  Yes [provider]  leflunomide (ARAVA) 10 MG tablet Take 10 mg by mouth daily. 02/27/22  Yes [provider]  losartan (COZAAR) 50 MG tablet Take 50 mg by mouth daily.   Yes [provider]  metoprolol succinate (TOPROL-XL) 50 MG 24 hr tablet Take  50 mg by mouth daily. 05/08/20  Yes [provider]  pantoprazole (PROTONIX) 40 MG tablet Take 40 mg by mouth daily. 09/10/21  Yes [provider]  sertraline (ZOLOFT) 50 MG tablet Take 50 mg by mouth at bedtime.   Yes [provider]  Upadacitinib ER (RINVOQ) 15 MG TB24 Take 15 mg by mouth daily.   Yes [provider]  albuterol (PROVENTIL HFA;VENTOLIN HFA) 108 (90 Base) MCG/ACT inhaler Inhale 2 puffs into the lungs every 6 (six) hours as needed for wheezing or shortness of breath. 05/29/17   Epifanio Lesches, MD  cyclobenzaprine (FLEXERIL) 10 MG tablet Take 5-10 mg by mouth at bedtime as needed. 02/19/22   [provider]  diazepam (VALIUM) 5 MG tablet SMARTSIG:1 Tablet(s) By  Mouth Patient not taking: Reported on 03/12/2022 02/06/22   [provider]  HYDROcodone-acetaminophen (NORCO) 7.5-325 MG tablet Take 1 tablet by mouth 2 (two) times daily as needed. 02/27/22   [provider]  traMADol (ULTRAM) 50 MG tablet Take 1 tablet (50 mg total) by mouth every 6 (six) hours as needed for moderate pain or severe pain (for pain not controlled by tylenol). 12/07/21   Benjamine Sprague, DO      VITAL SIGNS:  Blood pressure 106/71, pulse (!) 103, temperature 98.4 F (36.9 C), temperature source Oral, resp. rate 19, height 5' 1" (1.549 m), weight 51 kg, SpO2 95 %.  PHYSICAL EXAMINATION:  Physical Exam  GENERAL:  71 y.o.-year-old patient lying in the bed with no acute distress.  EYES: Pupils equal, round, reactive to light and accommodation. No scleral icterus. Extraocular muscles intact.  HEENT: Head atraumatic, normocephalic. Oropharynx and nasopharynx clear.  NECK:  Supple, no jugular venous distention. No thyroid enlargement, no tenderness.  LUNGS: Normal breath sounds bilaterally, no wheezing, rales,rhonchi or crepitation. No use of accessory muscles of respiration.  CARDIOVASCULAR: Regular rate and rhythm, S1, S2 normal. No murmurs, rubs, or gallops.  ABDOMEN: Soft, nondistended, nontender. Bowel sounds present. No organomegaly or mass.  EXTREMITIES: No pedal edema, cyanosis, or clubbing.  NEUROLOGIC: Cranial nerves II through XII are intact. Muscle strength 5/5 in all extremities. Sensation intact. Gait not checked.  PSYCHIATRIC: The patient is alert and oriented x 3.  Normal affect and good eye contact. SKIN: No obvious rash, lesion, or ulcer.   LABORATORY PANEL:   CBC Recent Labs  Lab 03/12/22 2025  WBC 9.6  HGB 8.2*  HCT 25.8*  PLT 498*   ------------------------------------------------------------------------------------------------------------------  Chemistries  Recent Labs  Lab 03/12/22 2025  NA 130*  K 4.4  CL 100  CO2 19*   GLUCOSE 105*  BUN 20  CREATININE 0.60  CALCIUM 8.2*  AST 53*  ALT 21  ALKPHOS 174*  BILITOT 1.0   ------------------------------------------------------------------------------------------------------------------  Cardiac Enzymes No results for input(s): "TROPONINI" in the last 168 hours. ------------------------------------------------------------------------------------------------------------------  RADIOLOGY:  CT CHEST ABDOMEN PELVIS W CONTRAST  Result Date: 03/12/2022 CLINICAL DATA:  Metastatic disease evaluation. Osseous metastatic disease of the thoracic and lumbar spine. EXAM: CT CHEST, ABDOMEN, AND PELVIS WITH CONTRAST TECHNIQUE: Multidetector CT imaging of the chest, abdomen and pelvis was performed following the standard protocol during bolus administration of intravenous contrast. RADIATION DOSE REDUCTION: This exam was performed according to the departmental dose-optimization program which includes automated exposure control, adjustment of the mA and/or kV according to patient size and/or use of iterative reconstruction technique. CONTRAST:  69m OMNIPAQUE IOHEXOL 300 MG/ML  SOLN COMPARISON:  Thoracic and lumbar spine MRI yesterday. Chest abdomen pelvis CT  12/06/2021 FINDINGS: CT CHEST FINDINGS Cardiovascular: Aortic atherosclerosis. Aortic tortuosity. The heart is normal in size. There are coronary artery calcifications. No pericardial effusion. Mediastinum/Nodes: Moderate-sized hiatal hernia. The esophagus is dilated and contains intraluminal debris to the level of the thoracic inlet. 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 The right thyroid gland may be surgically absent. No definite mediastinal or hilar adenopathy. Right axillary node measures 9 mm series 2, image 18. Lungs/Pleura: Small layering right pleural effusion. No convincing pleural nodularity or enhancement. Right lower lobe airspace disease is greater than typically seen with  compressive atelectasis. Mild emphysema. Tiny 3 mm left lower lobe nodule series 4, image 84. This was previously obscured. Previous calcified right lower lobe nodule is again seen, surrounded by adjacent atelectatic lung. Central bronchial thickening. There is layering debris/mucus within the distal trachea and right mainstem bronchus. Mild dependent atelectasis in the left lower lobe. Musculoskeletal: Multiple healing anterior rib fractures typically seen with CPR. Many of these demonstrate delayed union. Sternal body fracture may be pathologic with adjacent sclerosis. Known thoracic osseous metastatic disease was assessed on thoracic spine MRI yesterday. Pathologic T4 and T10 vertebral body compression fractures. Some of the lesions on MRI are visualized with lytic lesions, occasional areas of cortical sclerosis involving T3 and T4. There multiple small lucent rib lesions. The right subareolar breast tissue appears slightly lobulated, but is similar in appearance to prior exam. There is generalized body wall edema. Left shoulder arthroplasty. CT ABDOMEN PELVIS FINDINGS Hepatobiliary: Patient had numerous liver lesions on spine MRI. These lesions are difficult to visualize on the current exam, however there is a 2.8 cm lesion in the right hepatic dome. Multiple additional low-density lesions scattered throughout the liver, difficult to assess due to phase of contrast. Some of these lesions were previously characterized as hemangioma on MRI 11/02/2021, however some of these lesions are definitely new. Prior cholecystectomy. Common bile duct measures 9 mm proximally, 10 mm distally. There is a 7 mm stone in the distal common bile duct, series 5, image 58. There is slight central intrahepatic biliary ductal dilatation. Pancreas: 7 mm low-density in the pancreatic head, also seen on prior MRI, characterized as cyst. No evidence of suspicious pancreatic lesion. There is slight proximal ductal prominence of 4 mm. No  peripancreatic inflammation. Spleen: Normal in size without focal abnormality. Adrenals/Urinary Tract: No adrenal nodule. Mild left renal atrophy. Tiny hypodensities in the renal cortices are too small to characterize but likely small cysts. No solid renal lesion. No hydronephrosis. No bladder wall thickening. No dense of bladder lesion. Stomach/Bowel: Bowel assessment is limited in the absence of enteric contrast. There is a moderate hiatal hernia. Mild gastric wall hyperemia. No small bowel obstruction or inflammation. Normal appendix. Multifocal colonic diverticulosis. Stool distends the rectum. Circumferential anorectal thickening again seen, series 2, image 107. Vascular/Lymphatic: Aortic atherosclerosis. No aortic aneurysm. No definite abdominopelvic adenopathy. Reproductive: Atrophic uterus is tentatively visualized. No evidence of adnexal mass. Other: There is generalized edema of the subcutaneous and intra-abdominal fat. No convincing abdominopelvic ascites. No omental thickening. Musculoskeletal: There is a displaced left femoral neck fracture, presumably pathologic. Possible pathologic fracture of the right superior puboacetabular junction innumerable osseous metastasis throughout the pelvis, multiple areas of low-density as well as trabecular coarsening. Lumbar metastatic disease better assessed on lumbar spine yesterday remote L5 compression fracture. IMPRESSION: 1. Osseous metastatic disease throughout the chest, abdomen, and pelvis, as described. 2. Multiple hepatic lesions which are poorly defined on the current exam due  to phase of contrast, some of which are new from prior imaging and suspicious for metastatic disease. 3. Circumferential anorectal wall thickening, also seen on prior exam, recommend direct visualization. 4. Small layering right pleural effusion. Right lower lobe airspace disease is greater than typically seen with compressive atelectasis. This may be secondary to pneumonia, however  aspiration is considered given there is layering debris in the right mainstem bronchus. No obvious pulmonary mass. 5. Choledocholithiasis with 7 mm stone in the distal common bile duct. Mild biliary ductal dilatation. 6. Moderate hiatal hernia with dilated esophagus and intraluminal debris to the level of the thoracic inlet. 7. Colonic diverticulosis without diverticulitis. 8. 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 9. Displaced left femoral neck fracture, presumably pathologic. Possible pathologic fracture of the right superior puboacetabular junction, possible pathologic fracture of the sternum. 10. Generalized body wall edema. Aortic Atherosclerosis (ICD10-I70.0) and Emphysema (ICD10-J43.9). Electronically Signed   By: Keith Rake M.D.   On: 03/12/2022 22:10   DG Chest Portable 1 View  Result Date: 03/12/2022 CLINICAL DATA:  Shortness of breath. EXAM: PORTABLE CHEST 1 VIEW COMPARISON:  Chest radiograph dated 12/07/2021. FINDINGS: Small right pleural effusion and left lung base atelectasis or infiltrate. Probable left lung base atelectasis/scarring. No pneumothorax. The cardiac silhouette is within limits. No acute osseous pathology. Osteopenia. Left shoulder arthroplasty. IMPRESSION: Probable small right pleural effusion and right lung base atelectasis or infiltrate. Electronically Signed   By: Anner Crete M.D.   On: 03/12/2022 21:14   MR THORACIC SPINE WO CONTRAST  Result Date: 03/12/2022 CLINICAL DATA:  Mid and lower back pain. EXAM: MRI THORACIC AND LUMBAR SPINE WITHOUT CONTRAST TECHNIQUE: Multiplanar and multiecho pulse sequences of the thoracic and lumbar spine were obtained without intravenous contrast. COMPARISON:  CT chest abdomen pelvis April 13, 23. MRI February 12, 23. FINDINGS: MRI THORACIC SPINE FINDINGS Alignment:  No substantial sagittal subluxation Vertebrae: Extensive/numerous T1 hypointense and STIR hyperintense lesions throughout the  visualized lower cervical and thoracic spine, compatible with osseous metastatic disease. Lesions involve both the vertebral bodies as well as the posterior elements. Complete replacement of the bone marrow at T3 and T4. Height loss of the T4 and T10 vertebral bodies is compatible with pathologic fractures, new since CT on April 13, 23. Cord:  Normal cord signal. Paraspinal and other soft tissues: Layering right pleural effusion. Suspected dilated, fluid-filled esophagus. Disc levels: Poor definition of bone marrow cortex at T3 and T4 where there is complete marrow replacement, suggesting possible extraosseous extension of tumor although evaluation is limited without contrast. MRI LUMBAR SPINE FINDINGS Segmentation:  Standard. Alignment: Grade 1 retrolisthesis of L1 on L2 and L2 on L3 and grade 1 anterolisthesis of L5 on S1. Vertebrae: Numerous T1 hypointense and STIR hyperintense lesions throughout the visualized lumbar spine and sacrum, concerning for osseous metastatic disease. Remote L5 compression fracture with similar height loss. Conus medullaris and cauda equina: Conus extends to the T12 level. Conus appears normal. Paraspinal and other soft tissues: Limited intra-abdominal evaluation due to motion/artifact. Numerous hepatic lesions on the coronal T2 image. Disc levels: T12-L1: No significant disc protrusion, foraminal stenosis, or canal stenosis. L1-L2: Posterior disc osteophyte complex and facet arthropathy. Moderate right foraminal stenosis. Right subarticular stenosis with otherwise patent canal. L2-L3: Disc height loss. Disc bulging and endplate spurring. Similar moderate right and mild left foraminal stenosis with out significant canal stenosis. L3-L4: Right eccentric disc bulging with bilateral facet arthropathy. Similar moderate to severe canal stenosis with severe bilateral  subarticular recess stenosis. Similar mild-to-moderate right foraminal stenosis. L4-L5: Mild disc bulging bilateral facet  arthropathy. Moderate left and mild right foraminal stenosis, similar. L5-S1: Facet arthropathy and mild disc bulging. Mild left foraminal stenosis, similar. IMPRESSION: 1. Findings compatible with extensive osseous metastatic disease throughout the visualized cervical, thoracic, and lumbar spine, as well as the sacrum. 2. Pathologic fractures of T4 and T10, new since CT on December 06 2021. Similar height loss of an L5 compression fracture. 3. Poor definition of bone marrow cortex at T3 and T4 where there is complete marrow replacement, suggesting possible extraosseous extension of tumor although evaluation is limited without contrast. Postcontrast imaging could further characterize if clinically warranted. 4. Multilevel degenerative change, greatest in the lumbar spine with similar moderate to severe canal stenosis at L3-L4 and multilevel foraminal stenosis, detailed above. 5. Partially imaged numerous hepatic lesions, suspicious for metastatic disease given above findings. Also, layering right pleural effusion and suspected dilated fluid-filled esophagus. Recommend dedicated chest/abdominal/pelvic imaging to further evaluate disease burden and better evaluate for suspected primary malignancy. These results will be called to the ordering clinician or representative by the Radiologist Assistant, and communication documented in the PACS or [employer]. Electronically Signed   By: Margaretha Sheffield M.D.   On: 03/12/2022 13:57   MR LUMBAR SPINE WO CONTRAST  Result Date: 03/12/2022 CLINICAL DATA:  Mid and lower back pain. EXAM: MRI THORACIC AND LUMBAR SPINE WITHOUT CONTRAST TECHNIQUE: Multiplanar and multiecho pulse sequences of the thoracic and lumbar spine were obtained without intravenous contrast. COMPARISON:  CT chest abdomen pelvis April 13, 23. MRI February 12, 23. FINDINGS: MRI THORACIC SPINE FINDINGS Alignment:  No substantial sagittal subluxation Vertebrae: Extensive/numerous T1 hypointense and STIR  hyperintense lesions throughout the visualized lower cervical and thoracic spine, compatible with osseous metastatic disease. Lesions involve both the vertebral bodies as well as the posterior elements. Complete replacement of the bone marrow at T3 and T4. Height loss of the T4 and T10 vertebral bodies is compatible with pathologic fractures, new since CT on April 13, 23. Cord:  Normal cord signal. Paraspinal and other soft tissues: Layering right pleural effusion. Suspected dilated, fluid-filled esophagus. Disc levels: Poor definition of bone marrow cortex at T3 and T4 where there is complete marrow replacement, suggesting possible extraosseous extension of tumor although evaluation is limited without contrast. MRI LUMBAR SPINE FINDINGS Segmentation:  Standard. Alignment: Grade 1 retrolisthesis of L1 on L2 and L2 on L3 and grade 1 anterolisthesis of L5 on S1. Vertebrae: Numerous T1 hypointense and STIR hyperintense lesions throughout the visualized lumbar spine and sacrum, concerning for osseous metastatic disease. Remote L5 compression fracture with similar height loss. Conus medullaris and cauda equina: Conus extends to the T12 level. Conus appears normal. Paraspinal and other soft tissues: Limited intra-abdominal evaluation due to motion/artifact. Numerous hepatic lesions on the coronal T2 image. Disc levels: T12-L1: No significant disc protrusion, foraminal stenosis, or canal stenosis. L1-L2: Posterior disc osteophyte complex and facet arthropathy. Moderate right foraminal stenosis. Right subarticular stenosis with otherwise patent canal. L2-L3: Disc height loss. Disc bulging and endplate spurring. Similar moderate right and mild left foraminal stenosis with out significant canal stenosis. L3-L4: Right eccentric disc bulging with bilateral facet arthropathy. Similar moderate to severe canal stenosis with severe bilateral subarticular recess stenosis. Similar mild-to-moderate right foraminal stenosis. L4-L5:  Mild disc bulging bilateral facet arthropathy. Moderate left and mild right foraminal stenosis, similar. L5-S1: Facet arthropathy and mild disc bulging. Mild left foraminal stenosis, similar. IMPRESSION: 1. Findings compatible with extensive  osseous metastatic disease throughout the visualized cervical, thoracic, and lumbar spine, as well as the sacrum. 2. Pathologic fractures of T4 and T10, new since CT on December 06 2021. Similar height loss of an L5 compression fracture. 3. Poor definition of bone marrow cortex at T3 and T4 where there is complete marrow replacement, suggesting possible extraosseous extension of tumor although evaluation is limited without contrast. Postcontrast imaging could further characterize if clinically warranted. 4. Multilevel degenerative change, greatest in the lumbar spine with similar moderate to severe canal stenosis at L3-L4 and multilevel foraminal stenosis, detailed above. 5. Partially imaged numerous hepatic lesions, suspicious for metastatic disease given above findings. Also, layering right pleural effusion and suspected dilated fluid-filled esophagus. Recommend dedicated chest/abdominal/pelvic imaging to further evaluate disease burden and better evaluate for suspected primary malignancy. These results will be called to the ordering clinician or representative by the Radiologist Assistant, and communication documented in the PACS or [employer]. Electronically Signed   By: Margaretha Sheffield M.D.   On: 03/12/2022 13:57      IMPRESSION AND PLAN:  Assessment and Plan: No notes have been filed under this hospital service. Service: Hospitalist      DVT prophylaxis: Lovenox***  Advanced Care Planning:  Code Status: full code***  Family Communication:  The plan of care was discussed in details with the patient (and family). I answered all questions. The patient agreed to proceed with the above mentioned plan. Further management will depend upon hospital  course. Disposition Plan: Back to previous home environment Consults called: none***  All the records are reviewed and case discussed with ED provider.  Status is: Inpatient {Inpatient:23812}   At the time of the admission, it appears that the appropriate admission status for this patient is inpatient.  This is judged to be reasonable and necessary in order to provide the required intensity of service to ensure the patient's safety given the presenting symptoms, physical exam findings and initial radiographic and laboratory data in the context of comorbid conditions.  The patient requires inpatient status due to high intensity of service, high risk of further deterioration and high frequency of surveillance required.  I certify that at the time of admission, it is my clinical judgment that the patient will require inpatient hospital care extending more than 2 midnights.                            Dispo: The patient is from: Home              Anticipated d/c is to: Home              Patient currently is not medically stable to d/c.              Difficult to place patient: No  Christel Mormon M.D on 03/12/2022 at 11:48 PM  [facility]   From 7 PM-7 AM, contact night-coverage [email or web address]  CC: Primary care physician; Kathleen Harrier, MD

## 2022-03-12 NOTE — ED Notes (Signed)
Report given to Tiffany RN 

## 2022-03-13 ENCOUNTER — Inpatient Hospital Stay: Payer: Medicare HMO

## 2022-03-13 ENCOUNTER — Inpatient Hospital Stay
Admit: 2022-03-13 | Discharge: 2022-03-13 | Disposition: A | Discharge status: Home / Self Care | Payer: Medicare HMO | Attending: Cardiology | Admitting: Cardiology

## 2022-03-13 ENCOUNTER — Inpatient Hospital Stay: Payer: Self-pay

## 2022-03-13 DIAGNOSIS — J90 Pleural effusion, not elsewhere classified: Secondary | ICD-10-CM | POA: Diagnosis present

## 2022-03-13 DIAGNOSIS — D649 Anemia, unspecified: Secondary | ICD-10-CM | POA: Diagnosis present

## 2022-03-13 DIAGNOSIS — C7951 Secondary malignant neoplasm of bone: Secondary | ICD-10-CM | POA: Diagnosis not present

## 2022-03-13 DIAGNOSIS — J189 Pneumonia, unspecified organism: Secondary | ICD-10-CM | POA: Diagnosis present

## 2022-03-13 DIAGNOSIS — N6310 Unspecified lump in the right breast, unspecified quadrant: Secondary | ICD-10-CM

## 2022-03-13 DIAGNOSIS — K219 Gastro-esophageal reflux disease without esophagitis: Secondary | ICD-10-CM

## 2022-03-13 DIAGNOSIS — I10 Essential (primary) hypertension: Secondary | ICD-10-CM

## 2022-03-13 DIAGNOSIS — E43 Unspecified severe protein-calorie malnutrition: Secondary | ICD-10-CM | POA: Insufficient documentation

## 2022-03-13 DIAGNOSIS — C801 Malignant (primary) neoplasm, unspecified: Secondary | ICD-10-CM | POA: Diagnosis not present

## 2022-03-13 DIAGNOSIS — E785 Hyperlipidemia, unspecified: Secondary | ICD-10-CM

## 2022-03-13 DIAGNOSIS — K8050 Calculus of bile duct without cholangitis or cholecystitis without obstruction: Secondary | ICD-10-CM | POA: Diagnosis not present

## 2022-03-13 DIAGNOSIS — L8990 Pressure ulcer of unspecified site, unspecified stage: Secondary | ICD-10-CM | POA: Diagnosis present

## 2022-03-13 DIAGNOSIS — F32A Depression, unspecified: Secondary | ICD-10-CM

## 2022-03-13 DIAGNOSIS — F419 Anxiety disorder, unspecified: Secondary | ICD-10-CM

## 2022-03-13 DIAGNOSIS — C787 Secondary malignant neoplasm of liver and intrahepatic bile duct: Secondary | ICD-10-CM | POA: Diagnosis not present

## 2022-03-13 DIAGNOSIS — M069 Rheumatoid arthritis, unspecified: Secondary | ICD-10-CM

## 2022-03-13 DIAGNOSIS — M8450XA Pathological fracture in neoplastic disease, unspecified site, initial encounter for fracture: Secondary | ICD-10-CM | POA: Diagnosis not present

## 2022-03-13 LAB — ECHOCARDIOGRAM COMPLETE
AR max vel: 2.65 cm2
AV Area VTI: 3.48 cm2
AV Area mean vel: 2.73 cm2
AV Mean grad: 4.5 mmHg
AV Peak grad: 8.2 mmHg
Ao pk vel: 1.44 m/s
Area-P 1/2: 3.63 cm2
Height: 61 in
S' Lateral: 2.1 cm
Weight: 1798.95 oz

## 2022-03-13 LAB — RETICULOCYTES
Immature Retic Fract: 32.5 % — ABNORMAL HIGH (ref 2.3–15.9)
RBC.: 2.87 MIL/uL — ABNORMAL LOW (ref 3.87–5.11)
Retic Count, Absolute: 0.2 10*3/uL — ABNORMAL LOW (ref 19.0–186.0)
Retic Ct Pct: 5.3 % — ABNORMAL HIGH (ref 0.4–3.1)

## 2022-03-13 LAB — COMPREHENSIVE METABOLIC PANEL
ALT: 22 U/L (ref 0–44)
AST: 53 U/L — ABNORMAL HIGH (ref 15–41)
Albumin: 2.5 g/dL — ABNORMAL LOW (ref 3.5–5.0)
Alkaline Phosphatase: 187 U/L — ABNORMAL HIGH (ref 38–126)
Anion gap: 6 (ref 5–15)
BUN: 19 mg/dL (ref 8–23)
CO2: 23 mmol/L (ref 22–32)
Calcium: 8 mg/dL — ABNORMAL LOW (ref 8.9–10.3)
Chloride: 103 mmol/L (ref 98–111)
Creatinine, Ser: 0.74 mg/dL (ref 0.44–1.00)
GFR, Estimated: >60 mL/min (ref >60)
Glucose, Bld: 124 mg/dL — ABNORMAL HIGH (ref 70–99)
Potassium: 4.6 mmol/L (ref 3.5–5.1)
Sodium: 132 mmol/L — ABNORMAL LOW (ref 135–145)
Total Bilirubin: 0.7 mg/dL (ref 0.3–1.2)
Total Protein: 5.8 g/dL — ABNORMAL LOW (ref 6.5–8.1)

## 2022-03-13 LAB — URINALYSIS, COMPLETE (UACMP) WITH MICROSCOPIC
Bilirubin Urine: NEGATIVE
Glucose, UA: NEGATIVE mg/dL
Ketones, ur: NEGATIVE mg/dL
Nitrite: POSITIVE — AB
Protein, ur: NEGATIVE mg/dL
Specific Gravity, Urine: 1.016 (ref 1.005–1.030)
pH: 5 (ref 5.0–8.0)

## 2022-03-13 LAB — EXPECTORATED SPUTUM ASSESSMENT W GRAM STAIN, RFLX TO RESP C

## 2022-03-13 LAB — CBC
HCT: 25.5 % — ABNORMAL LOW (ref 36.0–46.0)
Hemoglobin: 8.5 g/dL — ABNORMAL LOW (ref 12.0–15.0)
MCH: 30.2 pg (ref 26.0–34.0)
MCHC: 33.3 g/dL (ref 30.0–36.0)
MCV: 90.7 fL (ref 80.0–100.0)
Platelets: 384 10*3/uL (ref 150–400)
RBC: 2.81 MIL/uL — ABNORMAL LOW (ref 3.87–5.11)
RDW: 19.7 % — ABNORMAL HIGH (ref 11.5–15.5)
WBC: 6.5 10*3/uL (ref 4.0–10.5)
nRBC: 0 % (ref 0.0–0.2)

## 2022-03-13 LAB — URINALYSIS, ROUTINE W REFLEX MICROSCOPIC
Bilirubin Urine: NEGATIVE
Glucose, UA: NEGATIVE mg/dL
Ketones, ur: 5 mg/dL — AB
Nitrite: POSITIVE — AB
Protein, ur: NEGATIVE mg/dL
Specific Gravity, Urine: 1.023 (ref 1.005–1.030)
pH: 5 (ref 5.0–8.0)

## 2022-03-13 LAB — IRON AND TIBC
Iron: 34 ug/dL (ref 28–170)
Saturation Ratios: 16 % (ref 10.4–31.8)
TIBC: 220 ug/dL — ABNORMAL LOW (ref 250–450)
UIBC: 186 ug/dL

## 2022-03-13 LAB — FERRITIN: Ferritin: 227 ng/mL (ref 11–307)

## 2022-03-13 LAB — LACTIC ACID, PLASMA: Lactic Acid, Venous: 1.5 mmol/L (ref 0.5–1.9)

## 2022-03-13 LAB — FOLATE: Folate: 20.3 ng/mL (ref >5.9)

## 2022-03-13 LAB — PREPARE RBC (CROSSMATCH)

## 2022-03-13 LAB — VITAMIN B12: Vitamin B-12: 1465 pg/mL — ABNORMAL HIGH (ref 180–914)

## 2022-03-13 LAB — STREP PNEUMONIAE URINARY ANTIGEN: Strep Pneumo Urinary Antigen: NEGATIVE

## 2022-03-13 LAB — HIV ANTIBODY (ROUTINE TESTING W REFLEX): HIV Screen 4th Generation wRfx: NONREACTIVE

## 2022-03-13 MED ORDER — ACETAMINOPHEN 325 MG PO TABS
650.0000 mg | ORAL_TABLET | Freq: Once | ORAL | Status: AC
  Administered 2022-03-13: 650 mg via ORAL
  Filled 2022-03-13: qty 2

## 2022-03-13 MED ORDER — PIPERACILLIN-TAZOBACTAM 3.375 G IVPB
3.3750 g | Freq: Three times a day (TID) | INTRAVENOUS | Status: AC
  Administered 2022-03-13 – 2022-03-17 (×15): 3.375 g via INTRAVENOUS
  Filled 2022-03-13 (×15): qty 50

## 2022-03-13 MED ORDER — CEFAZOLIN SODIUM-DEXTROSE 2-4 GM/100ML-% IV SOLN
2.0000 g | INTRAVENOUS | Status: AC
  Administered 2022-03-14: 2 g via INTRAVENOUS

## 2022-03-13 MED ORDER — ADULT MULTIVITAMIN W/MINERALS CH
1.0000 | ORAL_TABLET | Freq: Every day | ORAL | Status: DC
  Administered 2022-03-13 – 2022-03-19 (×7): 1 via ORAL
  Filled 2022-03-13 (×7): qty 1

## 2022-03-13 MED ORDER — DIPHENHYDRAMINE HCL 25 MG PO CAPS
25.0000 mg | ORAL_CAPSULE | Freq: Once | ORAL | Status: AC
  Administered 2022-03-13: 25 mg via ORAL
  Filled 2022-03-13: qty 1

## 2022-03-13 MED ORDER — SODIUM CHLORIDE 0.9% IV SOLUTION: Freq: Once | INTRAVENOUS | Status: AC

## 2022-03-13 MED ORDER — SODIUM CHLORIDE 0.9% FLUSH
10.0000 mL | Freq: Two times a day (BID) | INTRAVENOUS | Status: DC
  Administered 2022-03-13 – 2022-03-19 (×10): 10 mL

## 2022-03-13 MED ORDER — CHLORHEXIDINE GLUCONATE CLOTH 2 % EX PADS
6.0000 | MEDICATED_PAD | Freq: Every day | CUTANEOUS | Status: DC
Start: 2022-03-13 — End: 2022-03-19
  Administered 2022-03-13 – 2022-03-19 (×7): 6 via TOPICAL

## 2022-03-13 MED ORDER — UPADACITINIB ER 15 MG PO TB24: 15.0000 mg | ORAL_TABLET | Freq: Every day | ORAL | Status: DC

## 2022-03-13 MED ORDER — ENSURE ENLIVE PO LIQD
237.0000 mL | Freq: Two times a day (BID) | ORAL | Status: DC
Start: 2022-03-14 — End: 2022-03-19
  Administered 2022-03-15 – 2022-03-17 (×2): 237 mL via ORAL

## 2022-03-13 MED ORDER — SODIUM CHLORIDE 0.9% FLUSH
10.0000 mL | INTRAVENOUS | Status: DC | PRN
  Administered 2022-03-15: 10 mL

## 2022-03-13 NOTE — Assessment & Plan Note (Addendum)
-   The patient is on Rinvoq and leflunomide. -Leflunomide held, Outpatient follow-up.

## 2022-03-13 NOTE — Progress Notes (Addendum)
PROGRESS NOTE    Kathleen Blankenship  GQQ:[id number] DOB: 1951/02/07 DOA: 03/12/2022 PCP: Tracie Harrier, MD    Chief Complaint  Patient presents with   Urinary Retention   Back Pain    Brief Narrative:  Patient is a pleasant 71 year old female, history of CAD, GERD, hypertension, PEA cardiac arrest postoperatively after lap chole on 11/2011, underwent cardiac cath 2018 with insignificant CAD, ongoing tobacco use presented to [facility] 03/12/2022 with progressive worsening back and left hip pain times several weeks with difficulty ambulating.  MRI was done for further evaluation of her pain and resulted in extensive osseous metastatic disease.  CT of the L-spine done showed pathologic fractures of T4 and T10, CT chest abdomen and pelvis as well as plain films of the left hip and pelvis consistent with a displaced left femoral neck fracture.  Patient noted to also have a 30 pound unintentional weight loss over the last 6 months, difficulty emptying bladder with urgency, mild dysuria.  Patient noted to have also had an epidural injection on 02/07/2022.  Patient seen in the ED labs showed hyponatremia with a sodium of 130, elevated alk phosphatase of 174, albumin of 2.5, AST of 53, CT chest abdomen and pelvis with osseous metastatic disease throughout chest abdomen and pelvis, multiple hepatic lesions which are poorly defined, circumferential anorectal wall thickening, small layering right pleural effusion, right lower lobe airspace disease greater than typically seen with compressive atelectasis may be secondary to pneumonia versus aspiration, choledocholithiasis with 7 mm stone in the distal common bile duct, mild biliary ductal dilatation, moderate hiatal hernia with dilated esophagus and intraluminal debris to the level of the thoracic inlet, colonic diverticulosis, 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   Orthopedics consulted.  Oncology consulted.  Cardiology consulted for preop clearance.    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 - Patient admitted to [place] floor and subsequently placed on telemetry. -Pain management to be provided. - Orthopedic (Dr Leamon Arnt) consultation obtained, patient assessed by orthopedics and recommending transfusion of a unit of packed red blood cells in anticipation for possible surgery tomorrow, recommending hemiarthroplasty as treatment for her fracture which will help improve her left hip pain and opportunity to return to weightbearing and ambulation. -Oncology ( Dr Janese Banks) consulted . -Cardiology consulted for preop clearance.   Dyslipidemia - Continue statin.   Pleural effusion on right - This could be associated with right lower lobe airspace disease/pneumonia. - She does have cough however without fever or leukocytosis and she is a chronic smoker. -Patient with concern for metastatic disease, pathologic fracture, recent weight loss, noted to be on [place] and likely immunocompromised and as such we will place empirically on IV antibiotics for probable pneumonia.   -Supportive care  Anemia - Patient with no overt bleeding. -Check an anemia panel. -Transfuse  1 unit packed red blood cells in  anticipation of orthopedic surgery tomorrow as recommended by orthopedic surgeon. -Follow H&H.  Protein-calorie malnutrition, severe - We will place on nutritional supplementation.  PNA (pneumonia) - Patient with history of rheumatoid arthritis on Rinvoq and leflunomide with concerns for metastatic Pathologic Fracture. -CT chest with small layering right pleural effusion, right lower lobe airspace disease greater than typically seen with compressive atelectasis may be secondary to pneumonia however aspiration is considered given layering debris in the right mainstem bronchus.  No obvious pulmonary mass. -Patient also with moderate hiatal hernia with dilated esophagus and intraluminal debris to the level of the thoracic inlet noted. -Check urine Legionella antigen, urine pneumococcus antigen. -Place empirically on IV Zosyn.  GERD (gastroesophageal reflux disease) Continue PPI therapy.  Anxiety and depression -Continue home regimen Zoloft, Valium.   Essential hypertension -Continue home regimen Toprol-XL, Cozaar.  Choledocholithiasis - This is currently asymptomatic -Follow-up.  Rheumatoid arthritis ([place]) - The patient is on Rinvoq and leflunomide. -Resume home regimen.         DVT prophylaxis: SCDs, postop DVT prophylaxis per orthopedics. Code Status: DNR Family Communication: Updated patient, sister, niece at bedside. Disposition: TBD  Status is: Inpatient Remains inpatient appropriate because: Severity of illness   Consultants:  Oncology: Dr.Rao 03/13/2022 Orthopedics: Dr. Mack Guise 03/13/2022 Cardiology: Orinda Kenner, PA 03/13/2022   Procedures:  CT chest abdomen and pelvis 03/12/2022 Chest x-ray 03/12/2022 Plain films of the left hip and pelvis 03/13/2022 Plain films of the left knee 03/13/2022 2D echo 03/13/2022 PICC line placement 03/13/2022    Antimicrobials:  IV Zosyn 03/13/2022>>>>>   Subjective: Patient laying in bed.  Sister and niece at bedside.  Patient  denies any chest pain.  No shortness of breath.  No abdominal pain.  Complains of left hip pain.  Sister states patient's pain progressively worsened over the past several weeks as well as ambulation to the point whereby patient nonambulatory and requiring transfers with ongoing weakness.  Objective: Vitals:   03/13/22 0758 03/13/22 0811 03/13/22 1609 03/13/22 1633  BP: 112/83 126/79 117/86 108/79  Pulse: 97 100 96 (!) 102  Resp: _0 Temp: 98 F (36.7 C) 97.9 F (36.6 C) 98.2 F (36.8 C) 97.6 F (36.4 C)  TempSrc:      SpO2: 94% 100% 96% 96%  Weight:      Height:        Intake/Output Summary (Last 24 hours) at 03/13/2022 1641 Last data filed at 03/13/2022 1513 Gross per 24 hour  Intake 1259.94 ml  Output --  Net 1259.94 ml   Filed Weights   03/12/22 2027  Weight: 51 kg    Examination:  General exam: Appears calm and comfortable  Respiratory system: Clear to auscultation anterior lung fields.  No wheezes, no crackles, no rhonchi.Marland Kitchen Respiratory effort normal. Cardiovascular system: S1 & S2 heard, RRR. No JVD, murmurs, rubs, gallops or clicks. No pedal edema. Gastrointestinal system: Abdomen is nondistended, soft and nontender. No organomegaly or masses felt. Normal bowel sounds heard. Central nervous system: Alert and oriented. No focal neurological deficits. Extremities: Left lower extremity slightly shortened and externally rotated.  Tender to palpation in the left hip.  No edema noted.  Skin: No rashes, lesions or ulcers Psychiatry: Judgement and insight appear normal. Mood & affect appropriate.     Data Reviewed:   CBC: Recent Labs  Lab 03/12/22 2025 03/13/22 0149  WBC 9.6 6.5  NEUTROABS 7.5  --   HGB 8.2* 8.5*  HCT  25.8* 25.5*  MCV 92.8 90.7  PLT 498* 970    Basic Metabolic Panel: Recent Labs  Lab 03/12/22 2025 03/13/22 0149  NA 130* 132*  K 4.4 4.6  CL 100 103  CO2 19* 23  GLUCOSE 105* 124*  BUN 20 19  CREATININE 0.60 0.74  CALCIUM 8.2*  8.0*    GFR: Estimated Creatinine Clearance: 50.1 mL/min (by C-G formula based on SCr of 0.74 mg/dL).  Liver Function Tests: Recent Labs  Lab 03/12/22 2025 03/13/22 0149  AST 53* 53*  ALT 21 22  ALKPHOS 174* 187*  BILITOT 1.0 0.7  PROT 5.5* 5.8*  ALBUMIN 2.5* 2.5*    CBG: No results for input(s): "GLUCAP" in the last 168 hours.   Recent Results (from the past 240 hour(s))  Blood culture (routine x 2)     Status: None (Preliminary result)   Collection Time: 03/12/22  8:25 PM   Specimen: BLOOD  Result Value Ref Range Status   Specimen Description BLOOD BLOOD LEFT FOREARM  Final   Special Requests   Final    BOTTLES DRAWN AEROBIC AND ANAEROBIC Blood Culture results may not be optimal due to an inadequate volume of blood received in culture bottles   Culture   Final    NO GROWTH < 12 HOURS Performed at [facility], [address] [place], [place] [address]    Report Status PENDING  Incomplete  Blood culture (routine x 2)     Status: None (Preliminary result)   Collection Time: 03/12/22  8:25 PM   Specimen: BLOOD  Result Value Ref Range Status   Specimen Description BLOOD BLOOD LEFT HAND  Final   Special Requests   Final    BOTTLES DRAWN AEROBIC AND ANAEROBIC Blood Culture results may not be optimal due to an inadequate volume of blood received in culture bottles   Culture   Final    NO GROWTH < 12 HOURS Performed at [facility], [address] [place], [place] [address]    Report Status PENDING  Incomplete  Expectorated Sputum Assessment w Gram Stain, Rflx to Resp Cult     Status: None   Collection Time: 03/13/22 10:40 AM   Specimen: Expectorated Sputum  Result Value Ref Range Status   Specimen Description EXPECTORATED SPUTUM  Final   Special Requests NONE  Final   Sputum evaluation   Final    THIS SPECIMEN IS ACCEPTABLE FOR SPUTUM CULTURE Performed at [facility], [address], [place], [place] [address]    Report Status  03/13/2022 FINAL  Final         Radiology Studies: DG Knee Complete 4 Views Right  Result Date: 03/13/2022 CLINICAL DATA:  Bilateral knee pain EXAM: RIGHT KNEE - COMPLETE 4+ VIEW COMPARISON:  None Available. FINDINGS: Alignment is anatomic. No joint effusion. No acute fracture. No significant joint space narrowing. Vascular calcifications. IMPRESSION: No significant osseous abnormality. Electronically Signed   By: Macy Mis M.D.   On: 03/13/2022 13:05   DG Knee Complete 4 Views Left  Result Date: 03/13/2022 CLINICAL DATA:  Left knee injury. EXAM: LEFT KNEE - COMPLETE 4+ VIEW COMPARISON:  None Available. FINDINGS: No evidence of fracture, dislocation, or joint effusion. No evidence of arthropathy or other focal bone abnormality. Soft tissue edema superior to the left knee. IMPRESSION: 1. No acute fracture or dislocation identified about the left knee. 2. Soft tissue edema superior to the left knee. Electronically Signed   By: Linwood Dibbles.D.  On: 03/13/2022 13:04   ECHOCARDIOGRAM COMPLETE  Result Date: 03/13/2022    ECHOCARDIOGRAM REPORT   Patient Name:   Kathleen Blankenship [facility] Date of Exam: 03/13/2022 Medical Rec #:  [id number]      Height:       61.0 in Accession #:    [id number]     Weight:       112.4 lb Date of Birth:  14-Apr-1951      BSA:          1.479 m Patient Age:    45 years       BP:           126/79 mmHg Patient Gender: F              HR:           100 bpm. Exam Location:  [facility] Procedure: 2D Echo, Color Doppler and Cardiac Doppler Indications:     CAD native vessel I25.10  History:         Patient has prior history of Echocardiogram examinations, most                  recent 12/06/2021. CAD; Risk Factors:Hypertension and Current                  Smoker. Pneumonia.  Sonographer:     Sherrie Sport Referring Phys:  [id number] [place] TANG Diagnosing Phys: Yolonda Kida MD  Sonographer Comments: Suboptimal apical window and suboptimal subcostal window. Image acquisition challenging  due to COPD. IMPRESSIONS  1. Left ventricular ejection fraction, by estimation, is 65 to 70%. The left ventricle has normal function. The left ventricle has no regional wall motion abnormalities. There is mild left ventricular hypertrophy. Left ventricular diastolic parameters are consistent with Grade I diastolic dysfunction (impaired relaxation).  2. Right ventricular systolic function is normal. The right ventricular size is normal.  3. The mitral valve is normal in structure. Trivial mitral valve regurgitation.  4. The aortic valve is normal in structure. Aortic valve regurgitation is not visualized. FINDINGS  Left Ventricle: Left ventricular ejection fraction, by estimation, is 65 to 70%. The left ventricle has normal function. The left ventricle has no regional wall motion abnormalities. The left ventricular internal cavity size was normal in size. There is  mild left ventricular hypertrophy. Left ventricular diastolic parameters are consistent with Grade I diastolic dysfunction (impaired relaxation). Right Ventricle: The right ventricular size is normal. No increase in right ventricular wall thickness. Right ventricular systolic function is normal. Left Atrium: Left atrial size was normal in size. Right Atrium: Right atrial size was normal in size. Pericardium: There is no evidence of pericardial effusion. Mitral Valve: The mitral valve is normal in structure. Trivial mitral valve regurgitation. Tricuspid Valve: The tricuspid valve is normal in structure. Tricuspid valve regurgitation is trivial. Aortic Valve: The aortic valve is normal in structure. Aortic valve regurgitation is not visualized. Aortic valve mean gradient measures 4.5 mmHg. Aortic valve peak gradient measures 8.2 mmHg. Aortic valve area, by VTI measures 3.48 cm. Pulmonic Valve: The pulmonic valve was normal in structure. Pulmonic valve regurgitation is not visualized. Aorta: The ascending aorta was not well visualized. IAS/Shunts: No atrial  level shunt detected by color flow Doppler.  LEFT VENTRICLE PLAX 2D LVIDd:         3.30 cm   Diastology LVIDs:         2.10 cm   LV e' medial:    4.68 cm/s LV PW:  1.20 cm   LV E/e' medial:  19.7 LV IVS:        1.20 cm   LV e' lateral:   13.30 cm/s LVOT diam:     2.00 cm   LV E/e' lateral: 6.9 LV SV:         70 LV SV Index:   47 LVOT Area:     3.14 cm  RIGHT VENTRICLE RV Basal diam:  3.80 cm RV S prime:     13.60 cm/s TAPSE (M-mode): 1.6 cm LEFT ATRIUM             Index        RIGHT ATRIUM           Index LA diam:        2.10 cm 1.42 cm/m   RA Area:     13.80 cm LA Vol (A2C):   15.3 ml 10.35 ml/m  RA Volume:   35.30 ml  23.87 ml/m LA Vol (A4C):   22.2 ml 15.01 ml/m LA Biplane Vol: 19.6 ml 13.26 ml/m  AORTIC VALVE AV Area (Vmax):    2.65 cm AV Area (Vmean):   2.73 cm AV Area (VTI):     3.48 cm AV Vmax:           143.50 cm/s AV Vmean:          94.750 cm/s AV VTI:            0.202 m AV Peak Grad:      8.2 mmHg AV Mean Grad:      4.5 mmHg LVOT Vmax:         121.00 cm/s LVOT Vmean:        82.200 cm/s LVOT VTI:          0.223 m LVOT/AV VTI ratio: 1.11  AORTA Ao Root diam: 3.57 cm MITRAL VALVE                TRICUSPID VALVE MV Area (PHT): 3.63 cm     TR Peak grad:   40.4 mmHg MV Decel Time: 209 msec     TR Vmax:        318.00 cm/s MV E velocity: 92.10 cm/s MV A velocity: 102.00 cm/s  SHUNTS MV E/A ratio:  0.90         Systemic VTI:  0.22 m                             Systemic Diam: 2.00 cm Yolonda Kida MD Electronically signed by Yolonda Kida MD Signature Date/Time: 03/13/2022/12:56:43 PM    Final    [place] EKG SITE RITE  Result Date: 03/13/2022 If Site Rite image not attached, placement could not be confirmed due to current cardiac rhythm.  DG HIP UNILAT WITH PELVIS 2-3 VIEWS LEFT  Result Date: 03/13/2022 CLINICAL DATA:  Left hip fracture. EXAM: DG HIP (WITH OR WITHOUT PELVIS) 2-3V LEFT COMPARISON:  CT yesterday. FINDINGS: Displaced left femoral neck fracture with likely underlying lytic  lesion. Femoral head remains seated. There is proximal migration of the femoral shaft. The question pathologic fracture at the right puboacetabular junction is tentatively visualized. Many of the additional bone lesions on CT are not well seen by radiograph. There is excreted IV contrast in the urinary bladder which appears trabeculated. IMPRESSION: Displaced left femoral neck fracture with likely underlying lytic lesion. Possible pathologic fracture at the right puboacetabular junction on CT is tentatively visualized.  Many of the additional bone lesions are not well seen by radiograph. Electronically Signed   By: Keith Rake M.D.   On: 03/13/2022 01:03   CT CHEST ABDOMEN PELVIS W CONTRAST  Result Date: 03/12/2022 CLINICAL DATA:  Metastatic disease evaluation. Osseous metastatic disease of the thoracic and lumbar spine. EXAM: CT CHEST, ABDOMEN, AND PELVIS WITH CONTRAST TECHNIQUE: Multidetector CT imaging of the chest, abdomen and pelvis was performed following the standard protocol during bolus administration of intravenous contrast. RADIATION DOSE REDUCTION: This exam was performed according to the departmental dose-optimization program which includes automated exposure control, adjustment of the mA and/or kV according to patient size and/or use of iterative reconstruction technique. CONTRAST:  16m OMNIPAQUE IOHEXOL 300 MG/ML  SOLN COMPARISON:  Thoracic and lumbar spine MRI yesterday. Chest abdomen pelvis CT 12/06/2021 FINDINGS: CT CHEST FINDINGS Cardiovascular: Aortic atherosclerosis. Aortic tortuosity. The heart is normal in size. There are coronary artery calcifications. No pericardial effusion. Mediastinum/Nodes: Moderate-sized hiatal hernia. The esophagus is dilated and contains intraluminal debris to the level of the thoracic inlet. 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 The right thyroid gland may be surgically absent. No definite mediastinal or hilar  adenopathy. Right axillary node measures 9 mm series 2, image 18. Lungs/Pleura: Small layering right pleural effusion. No convincing pleural nodularity or enhancement. Right lower lobe airspace disease is greater than typically seen with compressive atelectasis. Mild emphysema. Tiny 3 mm left lower lobe nodule series 4, image 84. This was previously obscured. Previous calcified right lower lobe nodule is again seen, surrounded by adjacent atelectatic lung. Central bronchial thickening. There is layering debris/mucus within the distal trachea and right mainstem bronchus. Mild dependent atelectasis in the left lower lobe. Musculoskeletal: Multiple healing anterior rib fractures typically seen with CPR. Many of these demonstrate delayed union. Sternal body fracture may be pathologic with adjacent sclerosis. Known thoracic osseous metastatic disease was assessed on thoracic spine MRI yesterday. Pathologic T4 and T10 vertebral body compression fractures. Some of the lesions on MRI are visualized with lytic lesions, occasional areas of cortical sclerosis involving T3 and T4. There multiple small lucent rib lesions. The right subareolar breast tissue appears slightly lobulated, but is similar in appearance to prior exam. There is generalized body wall edema. Left shoulder arthroplasty. CT ABDOMEN PELVIS FINDINGS Hepatobiliary: Patient had numerous liver lesions on spine MRI. These lesions are difficult to visualize on the current exam, however there is a 2.8 cm lesion in the right hepatic dome. Multiple additional low-density lesions scattered throughout the liver, difficult to assess due to phase of contrast. Some of these lesions were previously characterized as hemangioma on MRI 11/02/2021, however some of these lesions are definitely new. Prior cholecystectomy. Common bile duct measures 9 mm proximally, 10 mm distally. There is a 7 mm stone in the distal common bile duct, series 5, image 58. There is slight central  intrahepatic biliary ductal dilatation. Pancreas: 7 mm low-density in the pancreatic head, also seen on prior MRI, characterized as cyst. No evidence of suspicious pancreatic lesion. There is slight proximal ductal prominence of 4 mm. No peripancreatic inflammation. Spleen: Normal in size without focal abnormality. Adrenals/Urinary Tract: No adrenal nodule. Mild left renal atrophy. Tiny hypodensities in the renal cortices are too small to characterize but likely small cysts. No solid renal lesion. No hydronephrosis. No bladder wall thickening. No dense of bladder lesion. Stomach/Bowel: Bowel assessment is limited in the absence of enteric contrast. There is a moderate hiatal hernia. Mild gastric wall  hyperemia. No small bowel obstruction or inflammation. Normal appendix. Multifocal colonic diverticulosis. Stool distends the rectum. Circumferential anorectal thickening again seen, series 2, image 107. Vascular/Lymphatic: Aortic atherosclerosis. No aortic aneurysm. No definite abdominopelvic adenopathy. Reproductive: Atrophic uterus is tentatively visualized. No evidence of adnexal mass. Other: There is generalized edema of the subcutaneous and intra-abdominal fat. No convincing abdominopelvic ascites. No omental thickening. Musculoskeletal: There is a displaced left femoral neck fracture, presumably pathologic. Possible pathologic fracture of the right superior puboacetabular junction innumerable osseous metastasis throughout the pelvis, multiple areas of low-density as well as trabecular coarsening. Lumbar metastatic disease better assessed on lumbar spine yesterday remote L5 compression fracture. IMPRESSION: 1. Osseous metastatic disease throughout the chest, abdomen, and pelvis, as described. 2. Multiple hepatic lesions which are poorly defined on the current exam due to phase of contrast, some of which are new from prior imaging and suspicious for metastatic disease. 3. Circumferential anorectal wall thickening,  also seen on prior exam, recommend direct visualization. 4. Small layering right pleural effusion. Right lower lobe airspace disease is greater than typically seen with compressive atelectasis. This may be secondary to pneumonia, however aspiration is considered given there is layering debris in the right mainstem bronchus. No obvious pulmonary mass. 5. Choledocholithiasis with 7 mm stone in the distal common bile duct. Mild biliary ductal dilatation. 6. Moderate hiatal hernia with dilated esophagus and intraluminal debris to the level of the thoracic inlet. 7. Colonic diverticulosis without diverticulitis. 8. 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 9. Displaced left femoral neck fracture, presumably pathologic. Possible pathologic fracture of the right superior puboacetabular junction, possible pathologic fracture of the sternum. 10. Generalized body wall edema. Aortic Atherosclerosis (ICD10-I70.0) and Emphysema (ICD10-J43.9). Electronically Signed   By: Keith Rake M.D.   On: 03/12/2022 22:10   DG Chest Portable 1 View  Result Date: 03/12/2022 CLINICAL DATA:  Shortness of breath. EXAM: PORTABLE CHEST 1 VIEW COMPARISON:  Chest radiograph dated 12/07/2021. FINDINGS: Small right pleural effusion and left lung base atelectasis or infiltrate. Probable left lung base atelectasis/scarring. No pneumothorax. The cardiac silhouette is within limits. No acute osseous pathology. Osteopenia. Left shoulder arthroplasty. IMPRESSION: Probable small right pleural effusion and right lung base atelectasis or infiltrate. Electronically Signed   By: Anner Crete M.D.   On: 03/12/2022 21:14   MR LUMBAR SPINE WO CONTRAST  Result Date: 03/12/2022 CLINICAL DATA:  Mid and lower back pain. EXAM: MRI THORACIC AND LUMBAR SPINE WITHOUT CONTRAST TECHNIQUE: Multiplanar and multiecho pulse sequences of the thoracic and lumbar spine were obtained without intravenous contrast. COMPARISON:   CT chest abdomen pelvis April 13, 23. MRI February 12, 23. FINDINGS: MRI THORACIC SPINE FINDINGS Alignment:  No substantial sagittal subluxation Vertebrae: Extensive/numerous T1 hypointense and STIR hyperintense lesions throughout the visualized lower cervical and thoracic spine, compatible with osseous metastatic disease. Lesions involve both the vertebral bodies as well as the posterior elements. Complete replacement of the bone marrow at T3 and T4. Height loss of the T4 and T10 vertebral bodies is compatible with pathologic fractures, new since CT on April 13, 23. Cord:  Normal cord signal. Paraspinal and other soft tissues: Layering right pleural effusion. Suspected dilated, fluid-filled esophagus. Disc levels: Poor definition of bone marrow cortex at T3 and T4 where there is complete marrow replacement, suggesting possible extraosseous extension of tumor although evaluation is limited without contrast. MRI LUMBAR SPINE FINDINGS Segmentation:  Standard. Alignment: Grade 1 retrolisthesis of L1 on L2 and L2 on L3 and grade  1 anterolisthesis of L5 on S1. Vertebrae: Numerous T1 hypointense and STIR hyperintense lesions throughout the visualized lumbar spine and sacrum, concerning for osseous metastatic disease. Remote L5 compression fracture with similar height loss. Conus medullaris and cauda equina: Conus extends to the T12 level. Conus appears normal. Paraspinal and other soft tissues: Limited intra-abdominal evaluation due to motion/artifact. Numerous hepatic lesions on the coronal T2 image. Disc levels: T12-L1: No significant disc protrusion, foraminal stenosis, or canal stenosis. L1-L2: Posterior disc osteophyte complex and facet arthropathy. Moderate right foraminal stenosis. Right subarticular stenosis with otherwise patent canal. L2-L3: Disc height loss. Disc bulging and endplate spurring. Similar moderate right and mild left foraminal stenosis with out significant canal stenosis. L3-L4: Right eccentric  disc bulging with bilateral facet arthropathy. Similar moderate to severe canal stenosis with severe bilateral subarticular recess stenosis. Similar mild-to-moderate right foraminal stenosis. L4-L5: Mild disc bulging bilateral facet arthropathy. Moderate left and mild right foraminal stenosis, similar. L5-S1: Facet arthropathy and mild disc bulging. Mild left foraminal stenosis, similar. IMPRESSION: 1. Findings compatible with extensive osseous metastatic disease throughout the visualized cervical, thoracic, and lumbar spine, as well as the sacrum. 2. Pathologic fractures of T4 and T10, new since CT on December 06 2021. Similar height loss of an L5 compression fracture. 3. Poor definition of bone marrow cortex at T3 and T4 where there is complete marrow replacement, suggesting possible extraosseous extension of tumor although evaluation is limited without contrast. Postcontrast imaging could further characterize if clinically warranted. 4. Multilevel degenerative change, greatest in the lumbar spine with similar moderate to severe canal stenosis at L3-L4 and multilevel foraminal stenosis, detailed above. 5. Partially imaged numerous hepatic lesions, suspicious for metastatic disease given above findings. Also, layering right pleural effusion and suspected dilated fluid-filled esophagus. Recommend dedicated chest/abdominal/pelvic imaging to further evaluate disease burden and better evaluate for suspected primary malignancy. These results will be called to the ordering clinician or representative by the Radiologist Assistant, and communication documented in the PACS or [employer]. Electronically Signed   By: Margaretha Sheffield M.D.   On: 03/12/2022 13:57        Scheduled Meds:  atorvastatin  10 mg Oral Daily   Chlorhexidine Gluconate Cloth  6 each Topical Daily   cholecalciferol  5,000 Units Oral Daily   gabapentin  300 mg Oral TID   leflunomide  10 mg Oral Daily   losartan  50 mg Oral Daily    metoprolol succinate  50 mg Oral Daily   multivitamin with minerals  1 tablet Oral Daily   pantoprazole  40 mg Oral Daily   sertraline  50 mg Oral QHS   sodium chloride flush  10-40 mL Intracatheter Q12H   Upadacitinib ER  15 mg Oral Daily   Continuous Infusions:  sodium chloride Stopped (03/13/22 1341)   [START ON 03/14/2022]  ceFAZolin (ANCEF) IV     piperacillin-tazobactam (ZOSYN)  IV 12.5 mL/hr at 03/13/22 1513     LOS: 1 day    Time spent: 40 minutes    Irine Seal, MD [facility]   To contact the attending provider between 7A-7P or the covering provider during after hours 7P-7A, please log into the web site [email or web address] and access using universal [place] password for that web site. If you do not have the password, please call the hospital operator.  03/13/2022, 4:41 PM

## 2022-03-13 NOTE — Assessment & Plan Note (Addendum)
No signs of bleeding.  Consistent with anemia of chronic disease.  Received 1 unit packed red blood cell transfusion 7/20 and then 2 more units given prior to surgery after hemoglobin dropped to 7.3.  Postsurgery, no significant drop.  Hemoglobin at 11.4.  Continue to follow.

## 2022-03-13 NOTE — Assessment & Plan Note (Addendum)
-  Continue home regimen Toprol-XL, Cozaar.

## 2022-03-13 NOTE — Progress Notes (Signed)
Pharmacy Antibiotic Note  Kathleen Blankenship is a 71 y.o. female admitted on 03/12/2022 with  community acquired pneumonia vs aspiration pneumonia . Presented to ED with a variety of symptoms of which included a productive cough and shortness of breath. Past medical history significant for anxiety, osteoarthritis, GERD, hypertension, tobacco abuse, coronary artery disease and previous history of PEA cardiac arrest. Recent admission in 11/2021, at which time received IV antibiotics (Zosyn and cefazolin). Pharmacy has been consulted for Zosyn dosing.  Chest x ray showing probable small right pleural effusion and right lung base atelectasis or infiltrate. CT showing pleural effusion 2/2 PNA or aspiration due to debris shown in bronchus. Slightly tachycardic, otherwise vital signs stable and on room air. Renal function stable and consistent with baseline.  Plan: Start Zosyn (piperacillin/tazobactam) 3.375 grams every 8 hours (4-hour infusion)  Monitor renal function, clinical course and length of therapy.  Height: '5\' 1"'$  (154.9 cm) Weight: 51 kg (112 lb 7 oz) IBW/kg (Calculated) : 47.8  Temp (24hrs), Avg:98.1 F (36.7 C), Min:97.9 F (36.6 C), Max:98.4 F (36.9 C)  Recent Labs  Lab 03/12/22 2025 03/13/22 0149  WBC 9.6 6.5  CREATININE 0.60 0.74  LATICACIDVEN 1.2 1.5    Estimated Creatinine Clearance: 50.1 mL/min (by C-G formula based on SCr of 0.74 mg/dL).    Allergies  Allergen Reactions   Codeine Hives   Methotrexate Nausea Only   Remicade [Infliximab] Other (See Comments)    Chest felt heavy, was given benadryl.    Antimicrobials this admission: Zosyn 7/19 >>   Dose adjustments this admission: None  Microbiology results: 7/18 BCx: No growth < 12 hours 7/19 UCx: to be collected  7/19 Sputum: to be collected  7/19 Strep pneumo antigen: to be collected 7/19 Legionella: to be collected  Thank you for allowing pharmacy to be a part of this patient's care.  Wynelle Cleveland 03/13/2022 8:21 AM

## 2022-03-13 NOTE — Assessment & Plan Note (Addendum)
Incidental finding.  Asymptomatic.

## 2022-03-13 NOTE — Progress Notes (Signed)
*  PRELIMINARY RESULTS* Echocardiogram 2D Echocardiogram has been performed.  Sherrie Sport 03/13/2022, 11:34 AM

## 2022-03-13 NOTE — Assessment & Plan Note (Addendum)
Nutrition Status: Nutrition Problem: Severe Malnutrition Etiology: chronic illness (metastatic bone disease) Signs/Symptoms: moderate fat depletion, severe fat depletion, moderate muscle depletion, severe muscle depletion Interventions: MVI, [employer] Instant Breakfast

## 2022-03-13 NOTE — Consult Note (Addendum)
Hematology/Oncology Consult note [facility] [phone] [phone]  Patient Care Team: Tracie Harrier, MD as PCP - General (Internal Medicine)   Name of the patient: Kathleen Blankenship  [id number]  07/20/1951    Reason for consult: Bone metastases unknown primary   Requesting physician: Dr. Eugenie Norrie  Date of visit: 03/13/2022    History of presenting illness-Patient is a 71 year old female with a past medical history significant for Coronary artery disease hypertension GERD.  She also has a history of cholecystectomy in April 9233 which was complicated by postop PEA and cardiac arrest requiring ICU stay.  She was admitted to the hospital for worsening back pain.  She underwent MRI thoracic and lumbar spine in the ER which showed extensive osseous metastatic disease throughout the cervical thoracic and lumbar spine.  Pathologic fractures of T4 and T10.  Similar height loss of an L5 compression fracture.  Partially imaged numerous hepatic lesions concerning for metastatic disease.  This was followed by a CT chest abdomen and pelvis with contrast which did not show distinct primary lesion.  Numerous liver lesions with a 2.8 cm lesion in the right hepatic dome.  No evidence of suspicious pancreatic lesion.  Displaced left femoral neck fracture presumably pathologic.  Possible for pathologic fracture of the right puboacetabular junction  Patient lives alone and over the last couple of weeks she has been feeling weaker when her sister moved in with her.  She has had ongoing back pain at least since February 2023.  She had an MRI of her spine in February as well as CT chest abdomen and pelvis with contrast in April 2023Which did not show any evidence of malignancy.  ECOG PS- 2-3  Pain scale- 5   Review of systems- Review of Systems  Constitutional:  Positive for malaise/fatigue. Negative for chills, fever and weight loss.  HENT:  Negative for  congestion, ear discharge and nosebleeds.   Eyes:  Negative for blurred vision.  Respiratory:  Negative for cough, hemoptysis, sputum production, shortness of breath and wheezing.   Cardiovascular:  Negative for chest pain, palpitations, orthopnea and claudication.  Gastrointestinal:  Negative for abdominal pain, blood in stool, constipation, diarrhea, heartburn, melena, nausea and vomiting.  Genitourinary:  Negative for dysuria, flank pain, frequency, hematuria and urgency.  Musculoskeletal:  Positive for back pain. Negative for joint pain and myalgias.  Skin:  Negative for rash.  Neurological:  Negative for dizziness, tingling, focal weakness, seizures, weakness and headaches.  Endo/Heme/Allergies:  Does not bruise/bleed easily.  Psychiatric/Behavioral:  Negative for depression and suicidal ideas. The patient does not have insomnia.     Allergies  Allergen Reactions   Codeine Hives   Methotrexate Nausea Only   Remicade [Infliximab] Other (See Comments)    Chest felt heavy, was given benadryl.    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 03/12/2022   Cardiac arrest ([place]) 12/06/2021   Choledocholithiasis    Closed fracture dislocation of right elbow joint, initial encounter 07/26/2020   CAD (coronary artery disease) 07/26/2020   Rheumatoid arthritis ([place]) 07/26/2020   Closed fracture dislocation of right elbow 07/26/2020   Closed fracture of left proximal humerus 07/26/2020   Chest pain  on exertion 05/28/2017   Chest pain 05/28/2017     Past Medical History:  Diagnosis Date   Anginal pain ([place])    Anxiety    Arthritis    RA   Cancer ([place])    Complication  of anesthesia 12/06/2021   PEA Cardiac Arrest with ROSC after 1 round of chest compressions and 1 mg epinephrine. Infiltrated IV found after rescusitative attempts were made.   Coronary artery disease    GERD (gastroesophageal reflux disease)    Hypertension    Pneumonia      Past Surgical History:  Procedure Laterality Date   CARDIAC CATHETERIZATION     CORONARY/GRAFT ACUTE MI REVASCULARIZATION N/A 05/28/2017   Procedure: Coronary/Graft Acute MI Revascularization;  Surgeon: Isaias Cowman, MD;  Location: [place] [facility];  Service: Cardiovascular;  Laterality: N/A;   ERCP N/A 11/22/2021   Procedure: ENDOSCOPIC RETROGRADE CHOLANGIOPANCREATOGRAPHY (ERCP);  Surgeon: Lucilla Lame, MD;  Location: [facility];  Service: Endoscopy;  Laterality: N/A;   LEFT HEART CATH AND CORONARY ANGIOGRAPHY N/A 05/28/2017   Procedure: LEFT HEART CATH AND CORONARY ANGIOGRAPHY;  Surgeon: Isaias Cowman, MD;  Location: [place] [facility];  Service: Cardiovascular;  Laterality: N/A;   ORIF ELBOW FRACTURE Right 07/26/2020   Procedure: OPEN REDUCTION INTERNAL FIXATION (ORIF) ELBOW/OLECRANON FRACTURE;  Surgeon: Shona Needles, MD;  Location: [place];  Service: Orthopedics;  Laterality: Right;   REVERSE SHOULDER ARTHROPLASTY Left 07/29/2020   Procedure: REVERSE SHOULDER ARTHROPLASTY;  Surgeon: Hiram Gash, MD;  Location: [place];  Service: Orthopedics;  Laterality: Left;   THYROID LOBECTOMY      Social History   Socioeconomic History   Marital status: Divorced    Spouse name: Not on file   Number of children: Not on file   Years of education: Not on file   Highest education level: Not on file  Occupational History   Not on file  Tobacco Use   Smoking status: Every Day    Packs/day: 1.00    Years: 37.00    Total pack years: 37.00    Types: Cigarettes   Smokeless tobacco: Never  Vaping Use   Vaping Use: Not on file  Substance and Sexual Activity   Alcohol use: No   Drug use: No    Sexual activity: Never  Other Topics Concern   Not on file  Social History Narrative   Not on file   Social Determinants of Health   Financial Resource Strain: Not on file  Food Insecurity: Not on file  Transportation Needs: Not on file  Physical Activity: Not on file  Stress: Not on file  Social Connections: Not on file  Intimate Partner Violence: Not on file     Family History  Family history unknown: Yes     Current Facility-Administered Medications:    0.9 %  sodium chloride infusion (Manually program via Guardrails IV Fluids), , Intravenous, Once, Eugenie Filler, MD   0.9 %  sodium chloride infusion, , Intravenous, Continuous, Mansy, Arvella Merles, MD, Stopped at 03/13/22 1341   acetaminophen (TYLENOL) tablet 650 mg, 650 mg, Oral, Q6H PRN, 650 mg at 03/13/22 0347 **OR** acetaminophen (TYLENOL) suppository 650 mg, 650 mg, Rectal, Q6H PRN, Mansy, Jan A, MD   acetaminophen (TYLENOL) tablet 650 mg, 650 mg, Oral, Once, Eugenie Filler, MD   albuterol (PROVENTIL) (2.5 MG/3ML) 0.083% nebulizer solution 3 mL, 3 mL, Nebulization, Q6H PRN, Mansy, Jan A, MD   atorvastatin (LIPITOR) tablet 10 mg, 10 mg, Oral, Daily, Mansy,  Jan A, MD, 10 mg at 03/13/22 0824   [START ON 03/14/2022] ceFAZolin (ANCEF) IVPB 2g/100 mL premix, 2 g, Intravenous, 30 min Pre-Op, [place], MD   cholecalciferol (VITAMIN D3) tablet 5,000 Units, 5,000 Units, Oral, Daily, Mansy, Arvella Merles, MD, 5,000 Units at 03/13/22 1779   cyclobenzaprine (FLEXERIL) tablet 5-10 mg, 5-10 mg, Oral, QHS PRN, Mansy, Jan A, MD, 10 mg at 03/13/22 0014   diphenhydrAMINE (BENADRYL) capsule 25 mg, 25 mg, Oral, Once, Eugenie Filler, MD   gabapentin (NEURONTIN) capsule 300 mg, 300 mg, Oral, TID, Mansy, Jan A, MD, 300 mg at 03/13/22 3903   HYDROcodone-acetaminophen (NORCO) 7.5-325 MG per tablet 1 tablet, 1 tablet, Oral, BID PRN, Mansy, Jan A, MD, 1 tablet at 03/13/22 0092   ketorolac (TORADOL) 15 MG/ML injection 15 mg, 15 mg, Intravenous,  Q6H PRN, Mansy, Jan A, MD   leflunomide (ARAVA) tablet 10 mg, 10 mg, Oral, Daily, Mansy, Jan A, MD, 10 mg at 03/13/22 0840   losartan (COZAAR) tablet 50 mg, 50 mg, Oral, Daily, Mansy, Jan A, MD, 50 mg at 03/13/22 0825   magnesium hydroxide (MILK OF MAGNESIA) suspension 30 mL, 30 mL, Oral, Daily PRN, Mansy, Jan A, MD   metoprolol succinate (TOPROL-XL) 24 hr tablet 50 mg, 50 mg, Oral, Daily, Mansy, Jan A, MD, 50 mg at 03/13/22 3300   morphine (PF) 2 MG/ML injection 2 mg, 2 mg, Intravenous, Q4H PRN, Mansy, Jan A, MD, 2 mg at 03/13/22 0014   multivitamin with minerals tablet 1 tablet, 1 tablet, Oral, Daily, Eugenie Filler, MD   ondansetron [facility]) tablet 4 mg, 4 mg, Oral, Q6H PRN **OR** ondansetron (ZOFRAN) injection 4 mg, 4 mg, Intravenous, Q6H PRN, Mansy, Jan A, MD   pantoprazole (PROTONIX) EC tablet 40 mg, 40 mg, Oral, Daily, Mansy, Jan A, MD, 40 mg at 03/13/22 0824   piperacillin-tazobactam (ZOSYN) IVPB 3.375 g, 3.375 g, Intravenous, Q8H, Wynelle Cleveland, RPH, Last Rate: 12.5 mL/hr at 03/13/22 1513, Infusion Verify at 03/13/22 1513   sertraline (ZOLOFT) tablet 50 mg, 50 mg, Oral, QHS, Mansy, Jan A, MD, 50 mg at 03/13/22 0014   traMADol (ULTRAM) tablet 50 mg, 50 mg, Oral, Q6H PRN, Mansy, Jan A, MD, 50 mg at 03/13/22 1214   traZODone (DESYREL) tablet 25 mg, 25 mg, Oral, QHS PRN, Mansy, Arvella Merles, MD   Upadacitinib ER TB24 15 mg, 15 mg, Oral, Daily, Eugenie Filler, MD   Physical exam:  Vitals:   03/13/22 0120 03/13/22 0409 03/13/22 0758 03/13/22 0811  BP: 116/80 103/69 112/83 126/79  Pulse: 99 97 97 100  Resp: 19 18 16 18   Temp: 98.2 F (36.8 C) 98 F (36.7 C) 98 F (36.7 C) 97.9 F (36.6 C)  TempSrc:      SpO2: 97% 98% 94% 100%  Weight:      Height:       Physical Exam Cardiovascular:     Rate and Rhythm: Normal rate and regular rhythm.     Heart sounds: Normal heart sounds.  Pulmonary:     Effort: Pulmonary effort is normal.     Breath sounds: Normal breath sounds.   Abdominal:     General: Bowel sounds are normal.     Palpations: Abdomen is soft.  Skin:    General: Skin is warm and dry.  Neurological:     Mental Status: She is alert and oriented to person, place, and time.   Breast exam: No palpable bilateral axillary adenopathy.  There is a  roughly 5 cm breast mass noted in the right breast in the central region.  No palpable left breast mass       Latest Ref Rng & Units 03/13/2022    1:49 AM  CMP  Glucose 70 - 99 mg/dL 124   BUN 8 - 23 mg/dL 19   Creatinine 0.44 - 1.00 mg/dL 0.74   Sodium 135 - 145 mmol/L 132   Potassium 3.5 - 5.1 mmol/L 4.6   Chloride 98 - 111 mmol/L 103   CO2 22 - 32 mmol/L 23   Calcium 8.9 - 10.3 mg/dL 8.0   Total Protein 6.5 - 8.1 g/dL 5.8   Total Bilirubin 0.3 - 1.2 mg/dL 0.7   Alkaline Phos 38 - 126 U/L 187   AST 15 - 41 U/L 53   ALT 0 - 44 U/L 22       Latest Ref Rng & Units 03/13/2022    1:49 AM  CBC  WBC 4.0 - 10.5 K/uL 6.5   Hemoglobin 12.0 - 15.0 g/dL 8.5   Hematocrit 36.0 - 46.0 % 25.5   Platelets 150 - 400 K/uL 384     @IMAGES @  DG Knee Complete 4 Views Right  Result Date: 03/13/2022 CLINICAL DATA:  Bilateral knee pain EXAM: RIGHT KNEE - COMPLETE 4+ VIEW COMPARISON:  None Available. FINDINGS: Alignment is anatomic. No joint effusion. No acute fracture. No significant joint space narrowing. Vascular calcifications. IMPRESSION: No significant osseous abnormality. Electronically Signed   By: Macy Mis M.D.   On: 03/13/2022 13:05   DG Knee Complete 4 Views Left  Result Date: 03/13/2022 CLINICAL DATA:  Left knee injury. EXAM: LEFT KNEE - COMPLETE 4+ VIEW COMPARISON:  None Available. FINDINGS: No evidence of fracture, dislocation, or joint effusion. No evidence of arthropathy or other focal bone abnormality. Soft tissue edema superior to the left knee. IMPRESSION: 1. No acute fracture or dislocation identified about the left knee. 2. Soft tissue edema superior to the left knee. Electronically Signed    By: Fidela Salisbury M.D.   On: 03/13/2022 13:04   ECHOCARDIOGRAM COMPLETE  Result Date: 03/13/2022    ECHOCARDIOGRAM REPORT   Patient Name:   VONDRA ALDREDGE [facility] Date of Exam: 03/13/2022 Medical Rec #:  [id number]      Height:       61.0 in Accession #:    [id number]     Weight:       112.4 lb Date of Birth:  1951/05/16      BSA:          1.479 m Patient Age:    47 years       BP:           126/79 mmHg Patient Gender: F              HR:           100 bpm. Exam Location:  [facility] Procedure: 2D Echo, Color Doppler and Cardiac Doppler Indications:     CAD native vessel I25.10  History:         Patient has prior history of Echocardiogram examinations, most                  recent 12/06/2021. CAD; Risk Factors:Hypertension and Current                  Smoker. Pneumonia.  Sonographer:     Sherrie Sport Referring Phys:  [id number] [place] TANG Diagnosing Phys: Yolonda Kida MD  Sonographer Comments: Suboptimal apical window and suboptimal subcostal window. Image acquisition challenging due to COPD. IMPRESSIONS  1. Left ventricular ejection fraction, by estimation, is 65 to 70%. The left ventricle has normal function. The left ventricle has no regional wall motion abnormalities. There is mild left ventricular hypertrophy. Left ventricular diastolic parameters are consistent with Grade I diastolic dysfunction (impaired relaxation).  2. Right ventricular systolic function is normal. The right ventricular size is normal.  3. The mitral valve is normal in structure. Trivial mitral valve regurgitation.  4. The aortic valve is normal in structure. Aortic valve regurgitation is not visualized. FINDINGS  Left Ventricle: Left ventricular ejection fraction, by estimation, is 65 to 70%. The left ventricle has normal function. The left ventricle has no regional wall motion abnormalities. The left ventricular internal cavity size was normal in size. There is  mild left ventricular hypertrophy. Left ventricular diastolic parameters are  consistent with Grade I diastolic dysfunction (impaired relaxation). Right Ventricle: The right ventricular size is normal. No increase in right ventricular wall thickness. Right ventricular systolic function is normal. Left Atrium: Left atrial size was normal in size. Right Atrium: Right atrial size was normal in size. Pericardium: There is no evidence of pericardial effusion. Mitral Valve: The mitral valve is normal in structure. Trivial mitral valve regurgitation. Tricuspid Valve: The tricuspid valve is normal in structure. Tricuspid valve regurgitation is trivial. Aortic Valve: The aortic valve is normal in structure. Aortic valve regurgitation is not visualized. Aortic valve mean gradient measures 4.5 mmHg. Aortic valve peak gradient measures 8.2 mmHg. Aortic valve area, by VTI measures 3.48 cm. Pulmonic Valve: The pulmonic valve was normal in structure. Pulmonic valve regurgitation is not visualized. Aorta: The ascending aorta was not well visualized. IAS/Shunts: No atrial level shunt detected by color flow Doppler.  LEFT VENTRICLE PLAX 2D LVIDd:         3.30 cm   Diastology LVIDs:         2.10 cm   LV e' medial:    4.68 cm/s LV PW:         1.20 cm   LV E/e' medial:  19.7 LV IVS:        1.20 cm   LV e' lateral:   13.30 cm/s LVOT diam:     2.00 cm   LV E/e' lateral: 6.9 LV SV:         70 LV SV Index:   47 LVOT Area:     3.14 cm  RIGHT VENTRICLE RV Basal diam:  3.80 cm RV S prime:     13.60 cm/s TAPSE (M-mode): 1.6 cm LEFT ATRIUM             Index        RIGHT ATRIUM           Index LA diam:        2.10 cm 1.42 cm/m   RA Area:     13.80 cm LA Vol (A2C):   15.3 ml 10.35 ml/m  RA Volume:   35.30 ml  23.87 ml/m LA Vol (A4C):   22.2 ml 15.01 ml/m LA Biplane Vol: 19.6 ml 13.26 ml/m  AORTIC VALVE AV Area (Vmax):    2.65 cm AV Area (Vmean):   2.73 cm AV Area (VTI):     3.48 cm AV Vmax:           143.50 cm/s AV Vmean:          94.750 cm/s AV VTI:  0.202 m AV Peak Grad:      8.2 mmHg AV Mean Grad:       4.5 mmHg LVOT Vmax:         121.00 cm/s LVOT Vmean:        82.200 cm/s LVOT VTI:          0.223 m LVOT/AV VTI ratio: 1.11  AORTA Ao Root diam: 3.57 cm MITRAL VALVE                TRICUSPID VALVE MV Area (PHT): 3.63 cm     TR Peak grad:   40.4 mmHg MV Decel Time: 209 msec     TR Vmax:        318.00 cm/s MV E velocity: 92.10 cm/s MV A velocity: 102.00 cm/s  SHUNTS MV E/A ratio:  0.90         Systemic VTI:  0.22 m                             Systemic Diam: 2.00 cm Yolonda Kida MD Electronically signed by Yolonda Kida MD Signature Date/Time: 03/13/2022/12:56:43 PM    Final    [place] EKG SITE RITE  Result Date: 03/13/2022 If Site Rite image not attached, placement could not be confirmed due to current cardiac rhythm.  DG HIP UNILAT WITH PELVIS 2-3 VIEWS LEFT  Result Date: 03/13/2022 CLINICAL DATA:  Left hip fracture. EXAM: DG HIP (WITH OR WITHOUT PELVIS) 2-3V LEFT COMPARISON:  CT yesterday. FINDINGS: Displaced left femoral neck fracture with likely underlying lytic lesion. Femoral head remains seated. There is proximal migration of the femoral shaft. The question pathologic fracture at the right puboacetabular junction is tentatively visualized. Many of the additional bone lesions on CT are not well seen by radiograph. There is excreted IV contrast in the urinary bladder which appears trabeculated. IMPRESSION: Displaced left femoral neck fracture with likely underlying lytic lesion. Possible pathologic fracture at the right puboacetabular junction on CT is tentatively visualized. Many of the additional bone lesions are not well seen by radiograph. Electronically Signed   By: Keith Rake M.D.   On: 03/13/2022 01:03   CT CHEST ABDOMEN PELVIS W CONTRAST  Result Date: 03/12/2022 CLINICAL DATA:  Metastatic disease evaluation. Osseous metastatic disease of the thoracic and lumbar spine. EXAM: CT CHEST, ABDOMEN, AND PELVIS WITH CONTRAST TECHNIQUE: Multidetector CT imaging of the chest, abdomen and  pelvis was performed following the standard protocol during bolus administration of intravenous contrast. RADIATION DOSE REDUCTION: This exam was performed according to the departmental dose-optimization program which includes automated exposure control, adjustment of the mA and/or kV according to patient size and/or use of iterative reconstruction technique. CONTRAST:  75m OMNIPAQUE IOHEXOL 300 MG/ML  SOLN COMPARISON:  Thoracic and lumbar spine MRI yesterday. Chest abdomen pelvis CT 12/06/2021 FINDINGS: CT CHEST FINDINGS Cardiovascular: Aortic atherosclerosis. Aortic tortuosity. The heart is normal in size. There are coronary artery calcifications. No pericardial effusion. Mediastinum/Nodes: Moderate-sized hiatal hernia. The esophagus is dilated and contains intraluminal debris to the level of the thoracic inlet. 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 The right thyroid gland may be surgically absent. No definite mediastinal or hilar adenopathy. Right axillary node measures 9 mm series 2, image 18. Lungs/Pleura: Small layering right pleural effusion. No convincing pleural nodularity or enhancement. Right lower lobe airspace disease is greater than typically seen with compressive atelectasis. Mild emphysema. Tiny 3 mm left lower lobe  nodule series 4, image 84. This was previously obscured. Previous calcified right lower lobe nodule is again seen, surrounded by adjacent atelectatic lung. Central bronchial thickening. There is layering debris/mucus within the distal trachea and right mainstem bronchus. Mild dependent atelectasis in the left lower lobe. Musculoskeletal: Multiple healing anterior rib fractures typically seen with CPR. Many of these demonstrate delayed union. Sternal body fracture may be pathologic with adjacent sclerosis. Known thoracic osseous metastatic disease was assessed on thoracic spine MRI yesterday. Pathologic T4 and T10 vertebral body compression  fractures. Some of the lesions on MRI are visualized with lytic lesions, occasional areas of cortical sclerosis involving T3 and T4. There multiple small lucent rib lesions. The right subareolar breast tissue appears slightly lobulated, but is similar in appearance to prior exam. There is generalized body wall edema. Left shoulder arthroplasty. CT ABDOMEN PELVIS FINDINGS Hepatobiliary: Patient had numerous liver lesions on spine MRI. These lesions are difficult to visualize on the current exam, however there is a 2.8 cm lesion in the right hepatic dome. Multiple additional low-density lesions scattered throughout the liver, difficult to assess due to phase of contrast. Some of these lesions were previously characterized as hemangioma on MRI 11/02/2021, however some of these lesions are definitely new. Prior cholecystectomy. Common bile duct measures 9 mm proximally, 10 mm distally. There is a 7 mm stone in the distal common bile duct, series 5, image 58. There is slight central intrahepatic biliary ductal dilatation. Pancreas: 7 mm low-density in the pancreatic head, also seen on prior MRI, characterized as cyst. No evidence of suspicious pancreatic lesion. There is slight proximal ductal prominence of 4 mm. No peripancreatic inflammation. Spleen: Normal in size without focal abnormality. Adrenals/Urinary Tract: No adrenal nodule. Mild left renal atrophy. Tiny hypodensities in the renal cortices are too small to characterize but likely small cysts. No solid renal lesion. No hydronephrosis. No bladder wall thickening. No dense of bladder lesion. Stomach/Bowel: Bowel assessment is limited in the absence of enteric contrast. There is a moderate hiatal hernia. Mild gastric wall hyperemia. No small bowel obstruction or inflammation. Normal appendix. Multifocal colonic diverticulosis. Stool distends the rectum. Circumferential anorectal thickening again seen, series 2, image 107. Vascular/Lymphatic: Aortic  atherosclerosis. No aortic aneurysm. No definite abdominopelvic adenopathy. Reproductive: Atrophic uterus is tentatively visualized. No evidence of adnexal mass. Other: There is generalized edema of the subcutaneous and intra-abdominal fat. No convincing abdominopelvic ascites. No omental thickening. Musculoskeletal: There is a displaced left femoral neck fracture, presumably pathologic. Possible pathologic fracture of the right superior puboacetabular junction innumerable osseous metastasis throughout the pelvis, multiple areas of low-density as well as trabecular coarsening. Lumbar metastatic disease better assessed on lumbar spine yesterday remote L5 compression fracture. IMPRESSION: 1. Osseous metastatic disease throughout the chest, abdomen, and pelvis, as described. 2. Multiple hepatic lesions which are poorly defined on the current exam due to phase of contrast, some of which are new from prior imaging and suspicious for metastatic disease. 3. Circumferential anorectal wall thickening, also seen on prior exam, recommend direct visualization. 4. Small layering right pleural effusion. Right lower lobe airspace disease is greater than typically seen with compressive atelectasis. This may be secondary to pneumonia, however aspiration is considered given there is layering debris in the right mainstem bronchus. No obvious pulmonary mass. 5. Choledocholithiasis with 7 mm stone in the distal common bile duct. Mild biliary ductal dilatation. 6. Moderate hiatal hernia with dilated esophagus and intraluminal debris to the level of the thoracic inlet. 7. Colonic diverticulosis without diverticulitis.  8. 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 9. Displaced left femoral neck fracture, presumably pathologic. Possible pathologic fracture of the right superior puboacetabular junction, possible pathologic fracture of the sternum. 10. Generalized body wall edema. Aortic Atherosclerosis  (ICD10-I70.0) and Emphysema (ICD10-J43.9). Electronically Signed   By: Keith Rake M.D.   On: 03/12/2022 22:10   DG Chest Portable 1 View  Result Date: 03/12/2022 CLINICAL DATA:  Shortness of breath. EXAM: PORTABLE CHEST 1 VIEW COMPARISON:  Chest radiograph dated 12/07/2021. FINDINGS: Small right pleural effusion and left lung base atelectasis or infiltrate. Probable left lung base atelectasis/scarring. No pneumothorax. The cardiac silhouette is within limits. No acute osseous pathology. Osteopenia. Left shoulder arthroplasty. IMPRESSION: Probable small right pleural effusion and right lung base atelectasis or infiltrate. Electronically Signed   By: Anner Crete M.D.   On: 03/12/2022 21:14   MR THORACIC SPINE WO CONTRAST  Result Date: 03/12/2022 CLINICAL DATA:  Mid and lower back pain. EXAM: MRI THORACIC AND LUMBAR SPINE WITHOUT CONTRAST TECHNIQUE: Multiplanar and multiecho pulse sequences of the thoracic and lumbar spine were obtained without intravenous contrast. COMPARISON:  CT chest abdomen pelvis April 13, 23. MRI February 12, 23. FINDINGS: MRI THORACIC SPINE FINDINGS Alignment:  No substantial sagittal subluxation Vertebrae: Extensive/numerous T1 hypointense and STIR hyperintense lesions throughout the visualized lower cervical and thoracic spine, compatible with osseous metastatic disease. Lesions involve both the vertebral bodies as well as the posterior elements. Complete replacement of the bone marrow at T3 and T4. Height loss of the T4 and T10 vertebral bodies is compatible with pathologic fractures, new since CT on April 13, 23. Cord:  Normal cord signal. Paraspinal and other soft tissues: Layering right pleural effusion. Suspected dilated, fluid-filled esophagus. Disc levels: Poor definition of bone marrow cortex at T3 and T4 where there is complete marrow replacement, suggesting possible extraosseous extension of tumor although evaluation is limited without contrast. MRI LUMBAR SPINE  FINDINGS Segmentation:  Standard. Alignment: Grade 1 retrolisthesis of L1 on L2 and L2 on L3 and grade 1 anterolisthesis of L5 on S1. Vertebrae: Numerous T1 hypointense and STIR hyperintense lesions throughout the visualized lumbar spine and sacrum, concerning for osseous metastatic disease. Remote L5 compression fracture with similar height loss. Conus medullaris and cauda equina: Conus extends to the T12 level. Conus appears normal. Paraspinal and other soft tissues: Limited intra-abdominal evaluation due to motion/artifact. Numerous hepatic lesions on the coronal T2 image. Disc levels: T12-L1: No significant disc protrusion, foraminal stenosis, or canal stenosis. L1-L2: Posterior disc osteophyte complex and facet arthropathy. Moderate right foraminal stenosis. Right subarticular stenosis with otherwise patent canal. L2-L3: Disc height loss. Disc bulging and endplate spurring. Similar moderate right and mild left foraminal stenosis with out significant canal stenosis. L3-L4: Right eccentric disc bulging with bilateral facet arthropathy. Similar moderate to severe canal stenosis with severe bilateral subarticular recess stenosis. Similar mild-to-moderate right foraminal stenosis. L4-L5: Mild disc bulging bilateral facet arthropathy. Moderate left and mild right foraminal stenosis, similar. L5-S1: Facet arthropathy and mild disc bulging. Mild left foraminal stenosis, similar. IMPRESSION: 1. Findings compatible with extensive osseous metastatic disease throughout the visualized cervical, thoracic, and lumbar spine, as well as the sacrum. 2. Pathologic fractures of T4 and T10, new since CT on December 06 2021. Similar height loss of an L5 compression fracture. 3. Poor definition of bone marrow cortex at T3 and T4 where there is complete marrow replacement, suggesting possible extraosseous extension of tumor although evaluation is limited without contrast. Postcontrast imaging could  further characterize if clinically  warranted. 4. Multilevel degenerative change, greatest in the lumbar spine with similar moderate to severe canal stenosis at L3-L4 and multilevel foraminal stenosis, detailed above. 5. Partially imaged numerous hepatic lesions, suspicious for metastatic disease given above findings. Also, layering right pleural effusion and suspected dilated fluid-filled esophagus. Recommend dedicated chest/abdominal/pelvic imaging to further evaluate disease burden and better evaluate for suspected primary malignancy. These results will be called to the ordering clinician or representative by the Radiologist Assistant, and communication documented in the PACS or [employer]. Electronically Signed   By: Margaretha Sheffield M.D.   On: 03/12/2022 13:57   MR LUMBAR SPINE WO CONTRAST  Result Date: 03/12/2022 CLINICAL DATA:  Mid and lower back pain. EXAM: MRI THORACIC AND LUMBAR SPINE WITHOUT CONTRAST TECHNIQUE: Multiplanar and multiecho pulse sequences of the thoracic and lumbar spine were obtained without intravenous contrast. COMPARISON:  CT chest abdomen pelvis April 13, 23. MRI February 12, 23. FINDINGS: MRI THORACIC SPINE FINDINGS Alignment:  No substantial sagittal subluxation Vertebrae: Extensive/numerous T1 hypointense and STIR hyperintense lesions throughout the visualized lower cervical and thoracic spine, compatible with osseous metastatic disease. Lesions involve both the vertebral bodies as well as the posterior elements. Complete replacement of the bone marrow at T3 and T4. Height loss of the T4 and T10 vertebral bodies is compatible with pathologic fractures, new since CT on April 13, 23. Cord:  Normal cord signal. Paraspinal and other soft tissues: Layering right pleural effusion. Suspected dilated, fluid-filled esophagus. Disc levels: Poor definition of bone marrow cortex at T3 and T4 where there is complete marrow replacement, suggesting possible extraosseous extension of tumor although evaluation is limited  without contrast. MRI LUMBAR SPINE FINDINGS Segmentation:  Standard. Alignment: Grade 1 retrolisthesis of L1 on L2 and L2 on L3 and grade 1 anterolisthesis of L5 on S1. Vertebrae: Numerous T1 hypointense and STIR hyperintense lesions throughout the visualized lumbar spine and sacrum, concerning for osseous metastatic disease. Remote L5 compression fracture with similar height loss. Conus medullaris and cauda equina: Conus extends to the T12 level. Conus appears normal. Paraspinal and other soft tissues: Limited intra-abdominal evaluation due to motion/artifact. Numerous hepatic lesions on the coronal T2 image. Disc levels: T12-L1: No significant disc protrusion, foraminal stenosis, or canal stenosis. L1-L2: Posterior disc osteophyte complex and facet arthropathy. Moderate right foraminal stenosis. Right subarticular stenosis with otherwise patent canal. L2-L3: Disc height loss. Disc bulging and endplate spurring. Similar moderate right and mild left foraminal stenosis with out significant canal stenosis. L3-L4: Right eccentric disc bulging with bilateral facet arthropathy. Similar moderate to severe canal stenosis with severe bilateral subarticular recess stenosis. Similar mild-to-moderate right foraminal stenosis. L4-L5: Mild disc bulging bilateral facet arthropathy. Moderate left and mild right foraminal stenosis, similar. L5-S1: Facet arthropathy and mild disc bulging. Mild left foraminal stenosis, similar. IMPRESSION: 1. Findings compatible with extensive osseous metastatic disease throughout the visualized cervical, thoracic, and lumbar spine, as well as the sacrum. 2. Pathologic fractures of T4 and T10, new since CT on December 06 2021. Similar height loss of an L5 compression fracture. 3. Poor definition of bone marrow cortex at T3 and T4 where there is complete marrow replacement, suggesting possible extraosseous extension of tumor although evaluation is limited without contrast. Postcontrast imaging could  further characterize if clinically warranted. 4. Multilevel degenerative change, greatest in the lumbar spine with similar moderate to severe canal stenosis at L3-L4 and multilevel foraminal stenosis, detailed above. 5. Partially imaged numerous hepatic lesions, suspicious for metastatic disease given above  findings. Also, layering right pleural effusion and suspected dilated fluid-filled esophagus. Recommend dedicated chest/abdominal/pelvic imaging to further evaluate disease burden and better evaluate for suspected primary malignancy. These results will be called to the ordering clinician or representative by the Radiologist Assistant, and communication documented in the PACS or [employer]. Electronically Signed   By: Margaretha Sheffield M.D.   On: 03/12/2022 13:57    Assessment and plan- Patient is a 71 y.o. 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  I am ordering breast tumor markers CA 15-3 and CA 27-29.  Breast primary remains high on the differential especially given the palpable right breast mass.  Patient states that she has had a breast mass for at least a year now and does not feel that the breast mass has grown in size.  It is unusual however that patient had an MRI spine and a CT chest abdomen and pelvis with contrast 3 months ago which did not show any evidence of metastatic disease which would be a little atypical with breast cancer. Based on pathology results I will consider getting an outpatient mammogram for the patient.  Patient will decide based on the results of biopsy as to how aggressive she wants to be with regards to treatment.  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  Patient also has  thoracic compression fractures and she predominantly came with back pain.  We are reaching out to interventional radiology to see if there would be a role for kyphoplasty for her back pain.  I will also have palliative care see the patient for symptom management.  Normocytic anemia: Possibly secondary to malignancyIron studies are suggestive of anemia of chronic disease.  Folate levels are normal.  B12 level elevated CMP showed hypocalcemia and low total protein which would be unusual with multiple myeloma causing anemia.  Renal functions are normal.  She is receiving 1 unit of PRBC transfusion today in anticipation of surgery tomorrow  Visit Diagnosis 1. Pathological fracture of neck of left femur, initial encounter ([place])   2. Metastatic cancer to bone ([place])   3. Bilateral leg pain     Dr. Randa Evens, MD, MPH [facility] [phone] 03/13/2022

## 2022-03-13 NOTE — Assessment & Plan Note (Addendum)
-   Patient with history of rheumatoid arthritis on Rinvoq and leflunomide with concerns for metastatic Pathologic Fracture. -CT chest with small layering right pleural effusion, right lower lobe airspace disease greater than typically seen with compressive atelectasis may be secondary to pneumonia however aspiration is considered given layering debris in the right mainstem bronchus.  No obvious pulmonary mass. -Patient also with moderate hiatal hernia with dilated esophagus and intraluminal debris to the level of the thoracic inlet noted. -Urine strep pneumococcus antigen negative.  Urine Legionella antigen negative -Continue IV Zosyn, Mucinex.

## 2022-03-13 NOTE — Consult Note (Signed)
[facility] NOTE       Patient ID: Kathleen Blankenship MRN: [id number] DOB/AGE: November 27, 1950 71 y.o.  Admit date: 03/12/2022 Referring Physician Dr. Irine Seal  Primary Physician Dr. Ginette Pitman Primary Cardiologist none Reason for Consultation medical optimization prior to orthopedic surgery   HPI: Kathleen Blankenship is an 57yoF with a PMH of hypertension, rheumatoid arthritis, post-operative lap chole PEA arrest 11/2011, insignificant CAD by heart cath 2018, pericarditis, ongoing tobacco use who presented to [facility] ED 03/12/2022 who presents with progressively worsening back and left hip pain for several weeks with significant difficulty ambulating.  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  CT chest abdomen pelvis performed showed a displaced left femoral neck fracture f which may require surgical management.  Cardiology is consulted for medical optimization prior to possible orthopedic surgery.  She presents with her sister who contributes to the history.  She says for the past few weeks she has been significantly weak and unable to perform her normal ADLs due to significant pain in her leg and back.  She had an MRI performed as above by her physiatrist which unfortunately showed significant metastatic disease, and imaging on admission showed a pathologic left femoral neck fracture.  She is waiting to be seen by oncology and orthopedic surgery to discuss further options for management of her pain and hip fracture in addition to his newly discovered malignancy.  She recalls her hospitalization in 2018 where she underwent a heart cath after she was having some right-sided seemingly positional chest pain and EKG was concerning for a STEMI.  She underwent emergent heart cath and it showed insignificant coronary disease and she was instead  treated for pericarditis.  In April of this year that was supposed to be for routine laparoscopic cholecystectomy which surgery was performed without untoward event, but postoperatively she became hypotensive and she did not have any IV access to start vasopressor support, subsequently lost pulses and received a round of CPR and ACLS for 2 minutes before ROSC.  She says she has always had difficulty with IVs blowing in staying in her arm.  She otherwise has not had any chest pain and has had some shortness of breath with transferring between her recliner and wheelchair, she attributes mostly to pain.  She has a intermittent wet cough that is present today but she does not think this is any worse than usual.  She denies palpitations, dizziness.  She has had some left lower leg swelling recently.  She sleeps in a recliner lately because its more comfortable with her back and hip, denies orthopnea, PND.  She has never been seen by a cardiologist on an outpatient basis.  She currently smokes less than a pack per day, but has at least a 40-pack-year history.   Vitals are notable for blood pressure of 126/79, SPO2 100% on room air heart rate 100.  Labs are notable for potassium of 4.6, BUN/creatinine 19/0.74, GFR greater than 60.  WBC is 6.5, H&H 8.5/25.5, platelets 381.  Chest x-ray shows a probable right small pleural effusion versus atelectasis or infiltrate.  Review of systems complete and found to be negative unless listed above     Past Medical History:  Diagnosis Date   Anginal pain ([place])    Anxiety    Arthritis    RA   Cancer ([place])  Complication of anesthesia 12/06/2021   PEA Cardiac Arrest with ROSC after 1 round of chest compressions and 1 mg epinephrine. Infiltrated IV found after rescusitative attempts were made.   Coronary artery disease    GERD (gastroesophageal reflux disease)    Hypertension    Pneumonia     Past Surgical History:  Procedure Laterality Date   CARDIAC  CATHETERIZATION     CORONARY/GRAFT ACUTE MI REVASCULARIZATION N/A 05/28/2017   Procedure: Coronary/Graft Acute MI Revascularization;  Surgeon: Isaias Cowman, MD;  Location: [facility];  Service: Cardiovascular;  Laterality: N/A;   ERCP N/A 11/22/2021   Procedure: ENDOSCOPIC RETROGRADE CHOLANGIOPANCREATOGRAPHY (ERCP);  Surgeon: Lucilla Lame, MD;  Location: [facility] ENDOSCOPY;  Service: Endoscopy;  Laterality: N/A;   LEFT HEART CATH AND CORONARY ANGIOGRAPHY N/A 05/28/2017   Procedure: LEFT HEART CATH AND CORONARY ANGIOGRAPHY;  Surgeon: Isaias Cowman, MD;  Location: [place] [facility];  Service: Cardiovascular;  Laterality: N/A;   ORIF ELBOW FRACTURE Right 07/26/2020   Procedure: OPEN REDUCTION INTERNAL FIXATION (ORIF) ELBOW/OLECRANON FRACTURE;  Surgeon: Shona Needles, MD;  Location: [facility];  Service: Orthopedics;  Laterality: Right;   REVERSE SHOULDER ARTHROPLASTY Left 07/29/2020   Procedure: REVERSE SHOULDER ARTHROPLASTY;  Surgeon: Hiram Gash, MD;  Location: [facility];  Service: Orthopedics;  Laterality: Left;   THYROID LOBECTOMY      Medications Prior to Admission  Medication Sig Dispense Refill Last Dose   alendronate (FOSAMAX) 70 MG tablet Take 70 mg by mouth once a week. Sunday   Past Week   atorvastatin (LIPITOR) 10 MG tablet Take 10 mg by mouth daily.   03/12/2022   Cholecalciferol (VITAMIN D) 125 MCG (5000 UT) CAPS Take 5,000 Units by mouth daily.   03/12/2022   gabapentin (NEURONTIN) 300 MG capsule Take 300 mg by mouth 3 (three) times daily.   03/12/2022   leflunomide (ARAVA) 10 MG tablet Take 10 mg by mouth daily.   03/12/2022   losartan (COZAAR) 50 MG tablet Take 50 mg by mouth daily.   03/12/2022   metoprolol succinate (TOPROL-XL) 50 MG 24 hr tablet Take 50 mg by mouth daily.   03/12/2022   pantoprazole (PROTONIX) 40 MG tablet Take 40 mg by mouth daily.   03/12/2022   sertraline (ZOLOFT) 50 MG tablet Take 50 mg by mouth at bedtime.   03/11/2022   Upadacitinib ER  (RINVOQ) 15 MG TB24 Take 15 mg by mouth daily.   03/12/2022   albuterol (PROVENTIL HFA;VENTOLIN HFA) 108 (90 Base) MCG/ACT inhaler Inhale 2 puffs into the lungs every 6 (six) hours as needed for wheezing or shortness of breath. 1 Inhaler 2 prn at prn   cyclobenzaprine (FLEXERIL) 10 MG tablet Take 5-10 mg by mouth at bedtime as needed.   prn at prn   diazepam (VALIUM) 5 MG tablet SMARTSIG:1 Tablet(s) By Mouth (Patient not taking: Reported on 03/12/2022)   Not Taking   HYDROcodone-acetaminophen (NORCO) 7.5-325 MG tablet Take 1 tablet by mouth 2 (two) times daily as needed.   prn at prn   traMADol (ULTRAM) 50 MG tablet Take 1 tablet (50 mg total) by mouth every 6 (six) hours as needed for moderate pain or severe pain (for pain not controlled by tylenol). 20 tablet 0 prn at prn   Social History   Socioeconomic History   Marital status: Divorced    Spouse name: Not on file   Number of children: Not on file   Years of education: Not on file   Highest education  level: Not on file  Occupational History   Not on file  Tobacco Use   Smoking status: Every Day    Packs/day: 1.00    Years: 37.00    Total pack years: 37.00    Types: Cigarettes   Smokeless tobacco: Never  Vaping Use   Vaping Use: Not on file  Substance and Sexual Activity   Alcohol use: No   Drug use: No   Sexual activity: Never  Other Topics Concern   Not on file  Social History Narrative   Not on file   Social Determinants of Health   Financial Resource Strain: Not on file  Food Insecurity: Not on file  Transportation Needs: Not on file  Physical Activity: Not on file  Stress: Not on file  Social Connections: Not on file  Intimate Partner Violence: Not on file    Family History  Family history unknown: Yes      PHYSICAL EXAM General: Pleasant elderly and thin Caucasian female,  in no acute distress.  Sitting upright in hospital bed with her sister at bedside HEENT:  Normocephalic and atraumatic. Neck:  No JVD.   Lungs: Normal respiratory effort on room air.  Decreased breath sounds bilaterally with trace expiratory wheeze  heart: Tachycardic but regular. Normal S1 and S2 without gallops or murmurs. Radial & DP pulses 2+ bilaterally. Abdomen: Non-distended appearing.  Msk: Normal strength and tone for age. Extremities: Warm and well perfused. No clubbing, cyanosis.  Trace left lower extremity edema edema.  Neuro: Alert and oriented X 3. Psych:  Answers questions appropriately.   Labs:   Lab Results  Component Value Date   WBC 6.5 03/13/2022   HGB 8.5 (L) 03/13/2022   HCT 25.5 (L) 03/13/2022   MCV 90.7 03/13/2022   PLT 384 03/13/2022    Recent Labs  Lab 03/13/22 0149  NA 132*  K 4.6  CL 103  CO2 23  BUN 19  CREATININE 0.74  CALCIUM 8.0*  PROT 5.8*  BILITOT 0.7  ALKPHOS 187*  ALT 22  AST 53*  GLUCOSE 124*   Lab Results  Component Value Date   CKTOTAL 99 09/10/2012   CKMB 1.4 09/10/2012   TROPONINI <0.03 05/28/2017    Lab Results  Component Value Date   CHOL 191 05/28/2017   Lab Results  Component Value Date   HDL 43 05/28/2017   Lab Results  Component Value Date   LDLCALC 136 (H) 05/28/2017   Lab Results  Component Value Date   TRIG 60 05/28/2017   Lab Results  Component Value Date   CHOLHDL 4.4 05/28/2017   No results found for: "LDLDIRECT"    Radiology: DG HIP UNILAT WITH PELVIS 2-3 VIEWS LEFT  Result Date: 03/13/2022 CLINICAL DATA:  Left hip fracture. EXAM: DG HIP (WITH OR WITHOUT PELVIS) 2-3V LEFT COMPARISON:  CT yesterday. FINDINGS: Displaced left femoral neck fracture with likely underlying lytic lesion. Femoral head remains seated. There is proximal migration of the femoral shaft. The question pathologic fracture at the right puboacetabular junction is tentatively visualized. Many of the additional bone lesions on CT are not well seen by radiograph. There is excreted IV contrast in the urinary bladder which appears trabeculated. IMPRESSION: Displaced  left femoral neck fracture with likely underlying lytic lesion. Possible pathologic fracture at the right puboacetabular junction on CT is tentatively visualized. Many of the additional bone lesions are not well seen by radiograph. Electronically Signed   By: Keith Rake M.D.   On: 03/13/2022 01:03  CT CHEST ABDOMEN PELVIS W CONTRAST  Result Date: 03/12/2022 CLINICAL DATA:  Metastatic disease evaluation. Osseous metastatic disease of the thoracic and lumbar spine. EXAM: CT CHEST, ABDOMEN, AND PELVIS WITH CONTRAST TECHNIQUE: Multidetector CT imaging of the chest, abdomen and pelvis was performed following the standard protocol during bolus administration of intravenous contrast. RADIATION DOSE REDUCTION: This exam was performed according to the departmental dose-optimization program which includes automated exposure control, adjustment of the mA and/or kV according to patient size and/or use of iterative reconstruction technique. CONTRAST:  53m OMNIPAQUE IOHEXOL 300 MG/ML  SOLN COMPARISON:  Thoracic and lumbar spine MRI yesterday. Chest abdomen pelvis CT 12/06/2021 FINDINGS: CT CHEST FINDINGS Cardiovascular: Aortic atherosclerosis. Aortic tortuosity. The heart is normal in size. There are coronary artery calcifications. No pericardial effusion. Mediastinum/Nodes: Moderate-sized hiatal hernia. The esophagus is dilated and contains intraluminal debris to the level of the thoracic inlet. 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 The right thyroid gland may be surgically absent. No definite mediastinal or hilar adenopathy. Right axillary node measures 9 mm series 2, image 18. Lungs/Pleura: Small layering right pleural effusion. No convincing pleural nodularity or enhancement. Right lower lobe airspace disease is greater than typically seen with compressive atelectasis. Mild emphysema. Tiny 3 mm left lower lobe nodule series 4, image 84. This was previously obscured.  Previous calcified right lower lobe nodule is again seen, surrounded by adjacent atelectatic lung. Central bronchial thickening. There is layering debris/mucus within the distal trachea and right mainstem bronchus. Mild dependent atelectasis in the left lower lobe. Musculoskeletal: Multiple healing anterior rib fractures typically seen with CPR. Many of these demonstrate delayed union. Sternal body fracture may be pathologic with adjacent sclerosis. Known thoracic osseous metastatic disease was assessed on thoracic spine MRI yesterday. Pathologic T4 and T10 vertebral body compression fractures. Some of the lesions on MRI are visualized with lytic lesions, occasional areas of cortical sclerosis involving T3 and T4. There multiple small lucent rib lesions. The right subareolar breast tissue appears slightly lobulated, but is similar in appearance to prior exam. There is generalized body wall edema. Left shoulder arthroplasty. CT ABDOMEN PELVIS FINDINGS Hepatobiliary: Patient had numerous liver lesions on spine MRI. These lesions are difficult to visualize on the current exam, however there is a 2.8 cm lesion in the right hepatic dome. Multiple additional low-density lesions scattered throughout the liver, difficult to assess due to phase of contrast. Some of these lesions were previously characterized as hemangioma on MRI 11/02/2021, however some of these lesions are definitely new. Prior cholecystectomy. Common bile duct measures 9 mm proximally, 10 mm distally. There is a 7 mm stone in the distal common bile duct, series 5, image 58. There is slight central intrahepatic biliary ductal dilatation. Pancreas: 7 mm low-density in the pancreatic head, also seen on prior MRI, characterized as cyst. No evidence of suspicious pancreatic lesion. There is slight proximal ductal prominence of 4 mm. No peripancreatic inflammation. Spleen: Normal in size without focal abnormality. Adrenals/Urinary Tract: No adrenal nodule. Mild  left renal atrophy. Tiny hypodensities in the renal cortices are too small to characterize but likely small cysts. No solid renal lesion. No hydronephrosis. No bladder wall thickening. No dense of bladder lesion. Stomach/Bowel: Bowel assessment is limited in the absence of enteric contrast. There is a moderate hiatal hernia. Mild gastric wall hyperemia. No small bowel obstruction or inflammation. Normal appendix. Multifocal colonic diverticulosis. Stool distends the rectum. Circumferential anorectal thickening again seen, series 2, image 107. Vascular/Lymphatic: Aortic atherosclerosis.  No aortic aneurysm. No definite abdominopelvic adenopathy. Reproductive: Atrophic uterus is tentatively visualized. No evidence of adnexal mass. Other: There is generalized edema of the subcutaneous and intra-abdominal fat. No convincing abdominopelvic ascites. No omental thickening. Musculoskeletal: There is a displaced left femoral neck fracture, presumably pathologic. Possible pathologic fracture of the right superior puboacetabular junction innumerable osseous metastasis throughout the pelvis, multiple areas of low-density as well as trabecular coarsening. Lumbar metastatic disease better assessed on lumbar spine yesterday remote L5 compression fracture. IMPRESSION: 1. Osseous metastatic disease throughout the chest, abdomen, and pelvis, as described. 2. Multiple hepatic lesions which are poorly defined on the current exam due to phase of contrast, some of which are new from prior imaging and suspicious for metastatic disease. 3. Circumferential anorectal wall thickening, also seen on prior exam, recommend direct visualization. 4. Small layering right pleural effusion. Right lower lobe airspace disease is greater than typically seen with compressive atelectasis. This may be secondary to pneumonia, however aspiration is considered given there is layering debris in the right mainstem bronchus. No obvious pulmonary mass. 5.  Choledocholithiasis with 7 mm stone in the distal common bile duct. Mild biliary ductal dilatation. 6. Moderate hiatal hernia with dilated esophagus and intraluminal debris to the level of the thoracic inlet. 7. Colonic diverticulosis without diverticulitis. 8. 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 9. Displaced left femoral neck fracture, presumably pathologic. Possible pathologic fracture of the right superior puboacetabular junction, possible pathologic fracture of the sternum. 10. Generalized body wall edema. Aortic Atherosclerosis (ICD10-I70.0) and Emphysema (ICD10-J43.9). Electronically Signed   By: Keith Rake M.D.   On: 03/12/2022 22:10   DG Chest Portable 1 View  Result Date: 03/12/2022 CLINICAL DATA:  Shortness of breath. EXAM: PORTABLE CHEST 1 VIEW COMPARISON:  Chest radiograph dated 12/07/2021. FINDINGS: Small right pleural effusion and left lung base atelectasis or infiltrate. Probable left lung base atelectasis/scarring. No pneumothorax. The cardiac silhouette is within limits. No acute osseous pathology. Osteopenia. Left shoulder arthroplasty. IMPRESSION: Probable small right pleural effusion and right lung base atelectasis or infiltrate. Electronically Signed   By: Anner Crete M.D.   On: 03/12/2022 21:14   MR THORACIC SPINE WO CONTRAST  Result Date: 03/12/2022 CLINICAL DATA:  Mid and lower back pain. EXAM: MRI THORACIC AND LUMBAR SPINE WITHOUT CONTRAST TECHNIQUE: Multiplanar and multiecho pulse sequences of the thoracic and lumbar spine were obtained without intravenous contrast. COMPARISON:  CT chest abdomen pelvis April 13, 23. MRI February 12, 23. FINDINGS: MRI THORACIC SPINE FINDINGS Alignment:  No substantial sagittal subluxation Vertebrae: Extensive/numerous T1 hypointense and STIR hyperintense lesions throughout the visualized lower cervical and thoracic spine, compatible with osseous metastatic disease. Lesions involve both the  vertebral bodies as well as the posterior elements. Complete replacement of the bone marrow at T3 and T4. Height loss of the T4 and T10 vertebral bodies is compatible with pathologic fractures, new since CT on April 13, 23. Cord:  Normal cord signal. Paraspinal and other soft tissues: Layering right pleural effusion. Suspected dilated, fluid-filled esophagus. Disc levels: Poor definition of bone marrow cortex at T3 and T4 where there is complete marrow replacement, suggesting possible extraosseous extension of tumor although evaluation is limited without contrast. MRI LUMBAR SPINE FINDINGS Segmentation:  Standard. Alignment: Grade 1 retrolisthesis of L1 on L2 and L2 on L3 and grade 1 anterolisthesis of L5 on S1. Vertebrae: Numerous T1 hypointense and STIR hyperintense lesions throughout the visualized lumbar spine and sacrum, concerning for osseous metastatic disease. Remote L5  compression fracture with similar height loss. Conus medullaris and cauda equina: Conus extends to the T12 level. Conus appears normal. Paraspinal and other soft tissues: Limited intra-abdominal evaluation due to motion/artifact. Numerous hepatic lesions on the coronal T2 image. Disc levels: T12-L1: No significant disc protrusion, foraminal stenosis, or canal stenosis. L1-L2: Posterior disc osteophyte complex and facet arthropathy. Moderate right foraminal stenosis. Right subarticular stenosis with otherwise patent canal. L2-L3: Disc height loss. Disc bulging and endplate spurring. Similar moderate right and mild left foraminal stenosis with out significant canal stenosis. L3-L4: Right eccentric disc bulging with bilateral facet arthropathy. Similar moderate to severe canal stenosis with severe bilateral subarticular recess stenosis. Similar mild-to-moderate right foraminal stenosis. L4-L5: Mild disc bulging bilateral facet arthropathy. Moderate left and mild right foraminal stenosis, similar. L5-S1: Facet arthropathy and mild disc bulging.  Mild left foraminal stenosis, similar. IMPRESSION: 1. Findings compatible with extensive osseous metastatic disease throughout the visualized cervical, thoracic, and lumbar spine, as well as the sacrum. 2. Pathologic fractures of T4 and T10, new since CT on December 06 2021. Similar height loss of an L5 compression fracture. 3. Poor definition of bone marrow cortex at T3 and T4 where there is complete marrow replacement, suggesting possible extraosseous extension of tumor although evaluation is limited without contrast. Postcontrast imaging could further characterize if clinically warranted. 4. Multilevel degenerative change, greatest in the lumbar spine with similar moderate to severe canal stenosis at L3-L4 and multilevel foraminal stenosis, detailed above. 5. Partially imaged numerous hepatic lesions, suspicious for metastatic disease given above findings. Also, layering right pleural effusion and suspected dilated fluid-filled esophagus. Recommend dedicated chest/abdominal/pelvic imaging to further evaluate disease burden and better evaluate for suspected primary malignancy. These results will be called to the ordering clinician or representative by the Radiologist Assistant, and communication documented in the PACS or [employer]. Electronically Signed   By: Margaretha Sheffield M.D.   On: 03/12/2022 13:57   MR LUMBAR SPINE WO CONTRAST  Result Date: 03/12/2022 CLINICAL DATA:  Mid and lower back pain. EXAM: MRI THORACIC AND LUMBAR SPINE WITHOUT CONTRAST TECHNIQUE: Multiplanar and multiecho pulse sequences of the thoracic and lumbar spine were obtained without intravenous contrast. COMPARISON:  CT chest abdomen pelvis April 13, 23. MRI February 12, 23. FINDINGS: MRI THORACIC SPINE FINDINGS Alignment:  No substantial sagittal subluxation Vertebrae: Extensive/numerous T1 hypointense and STIR hyperintense lesions throughout the visualized lower cervical and thoracic spine, compatible with osseous metastatic  disease. Lesions involve both the vertebral bodies as well as the posterior elements. Complete replacement of the bone marrow at T3 and T4. Height loss of the T4 and T10 vertebral bodies is compatible with pathologic fractures, new since CT on April 13, 23. Cord:  Normal cord signal. Paraspinal and other soft tissues: Layering right pleural effusion. Suspected dilated, fluid-filled esophagus. Disc levels: Poor definition of bone marrow cortex at T3 and T4 where there is complete marrow replacement, suggesting possible extraosseous extension of tumor although evaluation is limited without contrast. MRI LUMBAR SPINE FINDINGS Segmentation:  Standard. Alignment: Grade 1 retrolisthesis of L1 on L2 and L2 on L3 and grade 1 anterolisthesis of L5 on S1. Vertebrae: Numerous T1 hypointense and STIR hyperintense lesions throughout the visualized lumbar spine and sacrum, concerning for osseous metastatic disease. Remote L5 compression fracture with similar height loss. Conus medullaris and cauda equina: Conus extends to the T12 level. Conus appears normal. Paraspinal and other soft tissues: Limited intra-abdominal evaluation due to motion/artifact. Numerous hepatic lesions on the coronal T2 image. Disc levels: T12-L1:  No significant disc protrusion, foraminal stenosis, or canal stenosis. L1-L2: Posterior disc osteophyte complex and facet arthropathy. Moderate right foraminal stenosis. Right subarticular stenosis with otherwise patent canal. L2-L3: Disc height loss. Disc bulging and endplate spurring. Similar moderate right and mild left foraminal stenosis with out significant canal stenosis. L3-L4: Right eccentric disc bulging with bilateral facet arthropathy. Similar moderate to severe canal stenosis with severe bilateral subarticular recess stenosis. Similar mild-to-moderate right foraminal stenosis. L4-L5: Mild disc bulging bilateral facet arthropathy. Moderate left and mild right foraminal stenosis, similar. L5-S1: Facet  arthropathy and mild disc bulging. Mild left foraminal stenosis, similar. IMPRESSION: 1. Findings compatible with extensive osseous metastatic disease throughout the visualized cervical, thoracic, and lumbar spine, as well as the sacrum. 2. Pathologic fractures of T4 and T10, new since CT on December 06 2021. Similar height loss of an L5 compression fracture. 3. Poor definition of bone marrow cortex at T3 and T4 where there is complete marrow replacement, suggesting possible extraosseous extension of tumor although evaluation is limited without contrast. Postcontrast imaging could further characterize if clinically warranted. 4. Multilevel degenerative change, greatest in the lumbar spine with similar moderate to severe canal stenosis at L3-L4 and multilevel foraminal stenosis, detailed above. 5. Partially imaged numerous hepatic lesions, suspicious for metastatic disease given above findings. Also, layering right pleural effusion and suspected dilated fluid-filled esophagus. Recommend dedicated chest/abdominal/pelvic imaging to further evaluate disease burden and better evaluate for suspected primary malignancy. These results will be called to the ordering clinician or representative by the Radiologist Assistant, and communication documented in the PACS or [employer]. Electronically Signed   By: Margaretha Sheffield M.D.   On: 03/12/2022 13:57    05/2017 left heart cath and coronary angiography Mid LAD lesion, 30 %stenosed. Mid RCA lesion, 20 %stenosed.   1. Insignificant coronary artery disease 2. Normal left ventricular function 3. Symptoms suggestive of pericarditis  ECHO 12/06/2021   1. Left ventricular ejection fraction, by estimation, is 60 to 65%. The  left ventricle has normal function. The left ventricle has no regional  wall motion abnormalities. Left ventricular diastolic parameters are  consistent with Grade II diastolic  dysfunction (pseudonormalization).   2. Right ventricular  systolic function is normal. The right ventricular  size is normal. There is normal pulmonary artery systolic pressure. The  estimated right ventricular systolic pressure is 33.3 mmHg.   3. The mitral valve is normal in structure. Moderate mitral valve  regurgitation. No evidence of mitral stenosis. Moderate mitral annular  calcification.   4. The aortic valve is tricuspid. Aortic valve regurgitation is not  visualized. No aortic stenosis is present.   5. The inferior vena cava is normal in size with greater than 50%  respiratory variability, suggesting right atrial pressure of 3 mmHg.   TELEMETRY reviewed by me: None available for review  EKG reviewed by me and Dr. Clayborn Bigness: sinus tachycardia rate 102 without acute ST or T wave changes  ASSESSMENT AND PLAN:  Tucker Steedley is an 13yoF with a PMH of hypertension, rheumatoid arthritis, post-operative lap chole PEA arrest 11/2021, insignificant CAD by heart cath 2018, pericarditis, ongoing tobacco use who presented to [facility] ED 03/12/2022 who presents with progressively worsening back and left hip pain for several weeks with significant difficulty ambulating.  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  CT chest abdomen pelvis performed showed a displaced  left femoral neck fracture which may require surgical management.  Cardiology is consulted for medical optimization prior to possible orthopedic surgery.  #Medical optimization prior to potential orthopedic surgery #History of postoperative PEA arrest 11/2021  #Pathologic left femoral neck fracture She presents with a few weeks of progressive functional decline due to pain in her back and left leg resulting in her inability to ambulate.  MRI and CT imaging unfortunately show diffuse osseous metastasis and likely pathologic left femoral neck  fracture.  The circumstances surrounding her PEA arrest appear to be in the setting of hypotension post lap chole with difficult IV access so vasopressors cannot be started, subsequently she lost pulses and underwent 1 round of CPR and 2 minutes of ACLS care before achieving ROSC.  She presented as a possible STEMI and had an emergent heart cath in 2018 that showed insignificant coronary disease and was treated for pericarditis.  Her most recent echo from April shows normal LVEF she is not currently experiencing any heart failure symptoms at this time, although she is decreased ability to perform her ADLs due to pain and difficulty ambulating from her fracture.  Her EKG is without acute ischemic changes, showing sinus tachycardia there is no telemetry available for review thus far.   -Recommend continuous telemetry monitoring -Continue beta-blocker pre-, peri-, postoperatively -maintain IV access via 2 large-bore IVs  -We will obtain an echo today to evaluate her LVEF, otherwise no further cardiac diagnostics are recommended -She should be considered medically optimized from a cardiac standpoint and mild to moderate risk to undergo orthopedic surgery  This patient's plan of care was discussed and created with Dr. Clayborn Bigness and he is in agreement.  Signed: Tristan Schroeder , PA-C 03/13/2022, 10:20 AM [facility]

## 2022-03-13 NOTE — Progress Notes (Signed)
Peripherally Inserted Central Catheter Placement  The IV Nurse has discussed with the patient and/or persons authorized to consent for the patient, the purpose of this procedure and the potential benefits and risks involved with this procedure.  The benefits include less needle sticks, lab draws from the catheter, and the patient may be discharged home with the catheter. Risks include, but not limited to, infection, bleeding, blood clot (thrombus formation), and puncture of an artery; nerve damage and irregular heartbeat and possibility to perform a PICC exchange if needed/ordered by physician.  Alternatives to this procedure were also discussed.  Bard Power PICC patient education guide, fact sheet on infection prevention and patient information card has been provided to patient /or left at bedside.    PICC Placement Documentation  PICC Double Lumen 45/14/60 Left Basilic 40 cm 0 cm (Active)  Indication for Insertion or Continuance of Line Limited venous access - need for IV therapy >5 days (PICC only) 03/13/22 1535  Exposed Catheter (cm) 0 cm 03/13/22 1535  Site Assessment Clean, Dry, Intact 03/13/22 1535  Lumen #1 Status Saline locked;Flushed;Blood return noted 03/13/22 1535  Lumen #2 Status Saline locked;Flushed;Blood return noted 03/13/22 1535  Dressing Type Transparent;Securing device 03/13/22 1535  Dressing Status Antimicrobial disc in place 03/13/22 1535  Dressing Intervention New dressing;Other (Comment) 03/13/22 1535  Dressing Change Due 03/20/22 03/13/22 1535       Christella Noa Albarece 03/13/2022, 4:06 PM

## 2022-03-13 NOTE — Assessment & Plan Note (Addendum)
As needed Ativan.  Continue home Zoloft.

## 2022-03-13 NOTE — Assessment & Plan Note (Signed)
Continue PPI therapy. 

## 2022-03-13 NOTE — Progress Notes (Signed)
Initial Nutrition Assessment  DOCUMENTATION CODES:   Severe malnutrition in context of chronic illness  INTERVENTION:   -MVI with minerals daily -Carnation Instant Breakfast TID with meals -Liberalize diet to regular for widest variety of meal selections  NUTRITION DIAGNOSIS:   Severe Malnutrition related to chronic illness (metastatic bone disease) as evidenced by moderate fat depletion, severe fat depletion, moderate muscle depletion, severe muscle depletion.  GOAL:   Patient will meet greater than or equal to 90% of their needs  MONITOR:   PO intake, Supplement acceptance  REASON FOR ASSESSMENT:   Malnutrition Screening Tool    ASSESSMENT:   Pt with medical history significant for anxiety, osteoarthritis, GERD, hypertension, tobacco abuse, coronary artery disease and previous history of PEA cardiac arrest,, who presented with acute onset of back pain and difficulty urinating.  Pt admitted with lt femoral pathological fracture secondary to metastatic bone disease.   Reviewed I/O's: +199 ml x 24 hours   Per orthopedics notes, plan for hemiarthroplasty tomorrow.   Spoke with pt and sister at bedside. Pt shares that she has a fair appetite and consumed some salmon for lunch. Noted meal completions documented at 50%.   Pt reports that she typically eats small amounts at home, but intake has improved over the past several months since recovering from gallbladder surgery in February. Pt usually consumes 3 meals per day, which consist of a meat, starch, and vegetable. Her sister and neighbor ensure that she has meals.   Reviewed wt hx; pt has experienced a 6.3% wt loss over the past 3 months, which is not significant for time frame. Pt shares that her UBW is around 130# and lost about 25# over the past year.   Discussed importance of good meal and supplement intake to promote healing. Pt amenable to supplements.   Medications reviewed and include vitamin D3.  Labs  reviewed: CBGS: 147.   NUTRITION - FOCUSED PHYSICAL EXAM:  Flowsheet Row Most Recent Value  Orbital Region Moderate depletion  Upper Arm Region Severe depletion  Thoracic and Lumbar Region Severe depletion  Buccal Region Moderate depletion  Temple Region Moderate depletion  Clavicle Bone Region Severe depletion  Clavicle and Acromion Bone Region Severe depletion  Scapular Bone Region Severe depletion  Dorsal Hand Moderate depletion  Patellar Region Severe depletion  Anterior Thigh Region Severe depletion  Posterior Calf Region Severe depletion  Edema (RD Assessment) None  Hair Reviewed  Eyes Reviewed  Mouth Reviewed  Skin Reviewed  Nails Reviewed       Diet Order:   Diet Order             Diet NPO time specified Except for: Sips with Meds  Diet effective midnight           Diet regular Room service appropriate? Yes; Fluid consistency: Thin  Diet effective now                   EDUCATION NEEDS:   Education needs have been addressed  Skin:  Skin Assessment: Skin Integrity Issues: Skin Integrity Issues:: Stage II Stage II: perineum  Last BM:  Unknown  Height:   Ht Readings from Last 1 Encounters:  03/12/22 '5\' 1"'$  (1.549 m)    Weight:   Wt Readings from Last 1 Encounters:  03/12/22 51 kg    Ideal Body Weight:  47.7 kg  BMI:  Body mass index is 21.24 kg/m.  Estimated Nutritional Needs:   Kcal:  4166-0630  Protein:  90-105 grams  Fluid:  >  1.7 L    Loistine Chance, RD, LDN, [place] Registered Dietitian II Certified Diabetes Care and Education Specialist Please refer to [facility] for RD and/or RD on-call/weekend/after hours pager

## 2022-03-13 NOTE — Assessment & Plan Note (Addendum)
Left femoral neck fracture pathologic.  Seen by orthopedic surgery and underwent a left hemiarthroplasty on 7/20.  Surgical pathology pending.  Seen by PT and OT who are recommending home health.

## 2022-03-13 NOTE — Hospital Course (Addendum)
71 year old female with history of CAD, hypertension, PEA cardiac arrest postoperatively after lap chole on 11/2011, and ongoing tobacco use presented to the emergency department on 7/18 with progressively worsening back and left hip pain for several weeks causing difficulty to ambulate.  MRI done revealed extensive osseous metastatic disease with CT lumbar spine noting pathologic fractures at T4 and T10.  Also found to have a displaced left femoral neck pathologic fracture.  Lab work noteworthy for sodium of 130, albumin of 2.5.  Orthopedics and oncology consulted.  Course complicated by rapid atrial fibrillation that started on evening of 7/21.  Patient placed on a Cardizem drip.

## 2022-03-13 NOTE — Assessment & Plan Note (Addendum)
-   This could be associated with right lower lobe airspace disease/pneumonia. - She does have cough however without fever or leukocytosis and she is a chronic smoker. -Patient with concern for metastatic disease, pathologic fracture, recent weight loss, noted to be on [place], [place] and likely immunocompromised and as such patient placed empirically on IV Zosyn for probable pneumonia.   -Continue to hold [place].  -Supportive care

## 2022-03-13 NOTE — Consult Note (Addendum)
ORTHOPAEDIC CONSULTATION  REQUESTING PHYSICIAN: Eugenie Filler, MD  Chief Complaint: Left hip pain  HPI: Kathleen Blankenship is a 71 y.o. female was admitted overnight by the hospitalist service.  In the emergency department x-rays were taken which revealed diffuse metastatic disease.  Patient was found to have a left displaced femoral neck hip fracture by x-ray as well.  Today in the patient's room she states that she has been having pain in the left hip for approximately 2 to 3 weeks.  Her sister is at the bedside.  Patient does not recall a traumatic event.  She seems to recall the development of left hip pain after recent epidural steroid injection for low back pain.  Patient has not been able to weight-bear on the left lower extremity.  She does not take anticoagulation medication.  She has a history of osteoporosis and is on Fosamax and rheumatoid arthritis on Rinvoq and leflunomide.  Past Medical History:  Diagnosis Date   Anginal pain ([place])    Anxiety    Arthritis    RA   Cancer ([place])    Complication of anesthesia 12/06/2021   PEA Cardiac Arrest with ROSC after 1 round of chest compressions and 1 mg epinephrine. Infiltrated IV found after rescusitative attempts were made.   Coronary artery disease    GERD (gastroesophageal reflux disease)    Hypertension    Pneumonia    Past Surgical History:  Procedure Laterality Date   CARDIAC CATHETERIZATION     CORONARY/GRAFT ACUTE MI REVASCULARIZATION N/A 05/28/2017   Procedure: Coronary/Graft Acute MI Revascularization;  Surgeon: Isaias Cowman, MD;  Location: [place] [facility];  Service: Cardiovascular;  Laterality: N/A;   ERCP N/A 11/22/2021   Procedure: ENDOSCOPIC RETROGRADE CHOLANGIOPANCREATOGRAPHY (ERCP);  Surgeon: Lucilla Lame, MD;  Location: [facility];  Service: Endoscopy;  Laterality: N/A;   LEFT HEART CATH AND CORONARY ANGIOGRAPHY N/A 05/28/2017   Procedure: LEFT HEART CATH AND CORONARY ANGIOGRAPHY;  Surgeon:  Isaias Cowman, MD;  Location: [place] [facility];  Service: Cardiovascular;  Laterality: N/A;   ORIF ELBOW FRACTURE Right 07/26/2020   Procedure: OPEN REDUCTION INTERNAL FIXATION (ORIF) ELBOW/OLECRANON FRACTURE;  Surgeon: Shona Needles, MD;  Location: [place];  Service: Orthopedics;  Laterality: Right;   REVERSE SHOULDER ARTHROPLASTY Left 07/29/2020   Procedure: REVERSE SHOULDER ARTHROPLASTY;  Surgeon: Hiram Gash, MD;  Location: [place];  Service: Orthopedics;  Laterality: Left;   THYROID LOBECTOMY     Social History   Socioeconomic History   Marital status: Divorced    Spouse name: Not on file   Number of children: Not on file   Years of education: Not on file   Highest education level: Not on file  Occupational History   Not on file  Tobacco Use   Smoking status: Every Day    Packs/day: 1.00    Years: 37.00    Total pack years: 37.00    Types: Cigarettes   Smokeless tobacco: Never  Vaping Use   Vaping Use: Not on file  Substance and Sexual Activity   Alcohol use: No   Drug use: No   Sexual activity: Never  Other Topics Concern   Not on file  Social History Narrative   Not on file   Social Determinants of Health   Financial Resource Strain: Not on file  Food Insecurity: Not on file  Transportation Needs: Not on file  Physical Activity: Not on file  Stress: Not on file  Social Connections: Not on file  Family History  Family history unknown: Yes   Allergies  Allergen Reactions   Codeine Hives   Methotrexate Nausea Only   Remicade [Infliximab] Other (See Comments)    Chest felt heavy, was given benadryl.   Prior to Admission medications   Medication Sig Start Date End Date Taking? Authorizing Provider  alendronate (FOSAMAX) 70 MG tablet Take 70 mg by mouth once a week. 'Sunday 09/27/21  Yes [provider]  atorvastatin (LIPITOR) 10 MG tablet Take 10 mg by mouth daily. 06/26/20  Yes [provider]  Cholecalciferol (VITAMIN D) 125 MCG  (5000 UT) CAPS Take 5,000 Units by mouth daily.   Yes [provider]  gabapentin (NEURONTIN) 300 MG capsule Take 300 mg by mouth 3 (three) times daily. 02/27/22  Yes [provider]  leflunomide (ARAVA) 10 MG tablet Take 10 mg by mouth daily. 02/27/22  Yes [provider]  losartan (COZAAR) 50 MG tablet Take 50 mg by mouth daily.   Yes [provider]  metoprolol succinate (TOPROL-XL) 50 MG 24 hr tablet Take 50 mg by mouth daily. 05/08/20  Yes [provider]  pantoprazole (PROTONIX) 40 MG tablet Take 40 mg by mouth daily. 09/10/21  Yes [provider]  sertraline (ZOLOFT) 50 MG tablet Take 50 mg by mouth at bedtime.   Yes [provider]  Upadacitinib ER (RINVOQ) 15 MG TB24 Take 15 mg by mouth daily.   Yes [provider]  albuterol (PROVENTIL HFA;VENTOLIN HFA) 108 (90 Base) MCG/ACT inhaler Inhale 2 puffs into the lungs every 6 (six) hours as needed for wheezing or shortness of breath. 05/29/17   Konidena, Snehalatha, MD  cyclobenzaprine (FLEXERIL) 10 MG tablet Take 5-10 mg by mouth at bedtime as needed. 02/19/22   [provider]  diazepam (VALIUM) 5 MG tablet SMARTSIG:1 Tablet(s) By Mouth Patient not taking: Reported on 03/12/2022 02/06/22   [provider]  HYDROcodone-acetaminophen (NORCO) 7.5-325 MG tablet Take 1 tablet by mouth 2 (two) times daily as needed. 02/27/22   [provider]  traMADol (ULTRAM) 50 MG tablet Take 1 tablet (50 mg total) by mouth every 6 (six) hours as needed for moderate pain or severe pain (for pain not controlled by tylenol). 12/07/21   Sakai, Isami, DO   DG Knee Complete 4 Views Right  Result Date: 03/13/2022 CLINICAL DATA:  Bilateral knee pain EXAM: RIGHT KNEE - COMPLETE 4+ VIEW COMPARISON:  None Available. FINDINGS: Alignment is anatomic. No joint effusion. No acute fracture. No significant joint space narrowing. Vascular calcifications. IMPRESSION: No significant osseous  abnormality. Electronically Signed   By: Praneil  Patel M.D.   On: 03/13/2022 13:05   DG Knee Complete 4 Views Left  Result Date: 03/13/2022 CLINICAL DATA:  Left knee injury. EXAM: LEFT KNEE - COMPLETE 4+ VIEW COMPARISON:  None Available. FINDINGS: No evidence of fracture, dislocation, or joint effusion. No evidence of arthropathy or other focal bone abnormality. Soft tissue edema superior to the left knee. IMPRESSION: 1. No acute fracture or dislocation identified about the left knee. 2. Soft tissue edema superior to the left knee. Electronically Signed   By: Dobrinka  Dimitrova M.D.   On: 03/13/2022 13:04   ECHOCARDIOGRAM COMPLETE  Result Date: 03/13/2022    ECHOCARDIOGRAM REPORT   Patient Name:   Kathleen Blankenship Date of Exam: 03/13/2022 Medical Rec #:  [id number]      Height:       61'$ .0 in Accession #:    [id number]     Weight:  112.4 lb Date of Birth:  Jan 24, 1951      BSA:          1.479 m Patient Age:    76 years       BP:           126/79 mmHg Patient Gender: F              HR:           100 bpm. Exam Location:  [facility] Procedure: 2D Echo, Color Doppler and Cardiac Doppler Indications:     CAD native vessel I25.10  History:         Patient has prior history of Echocardiogram examinations, most                  recent 12/06/2021. CAD; Risk Factors:Hypertension and Current                  Smoker. Pneumonia.  Sonographer:     Sherrie Sport Referring Phys:  [id number] [place] TANG Diagnosing Phys: Yolonda Kida MD  Sonographer Comments: Suboptimal apical window and suboptimal subcostal window. Image acquisition challenging due to COPD. IMPRESSIONS  1. Left ventricular ejection fraction, by estimation, is 65 to 70%. The left ventricle has normal function. The left ventricle has no regional wall motion abnormalities. There is mild left ventricular hypertrophy. Left ventricular diastolic parameters are consistent with Grade I diastolic dysfunction (impaired relaxation).  2. Right ventricular systolic  function is normal. The right ventricular size is normal.  3. The mitral valve is normal in structure. Trivial mitral valve regurgitation.  4. The aortic valve is normal in structure. Aortic valve regurgitation is not visualized. FINDINGS  Left Ventricle: Left ventricular ejection fraction, by estimation, is 65 to 70%. The left ventricle has normal function. The left ventricle has no regional wall motion abnormalities. The left ventricular internal cavity size was normal in size. There is  mild left ventricular hypertrophy. Left ventricular diastolic parameters are consistent with Grade I diastolic dysfunction (impaired relaxation). Right Ventricle: The right ventricular size is normal. No increase in right ventricular wall thickness. Right ventricular systolic function is normal. Left Atrium: Left atrial size was normal in size. Right Atrium: Right atrial size was normal in size. Pericardium: There is no evidence of pericardial effusion. Mitral Valve: The mitral valve is normal in structure. Trivial mitral valve regurgitation. Tricuspid Valve: The tricuspid valve is normal in structure. Tricuspid valve regurgitation is trivial. Aortic Valve: The aortic valve is normal in structure. Aortic valve regurgitation is not visualized. Aortic valve mean gradient measures 4.5 mmHg. Aortic valve peak gradient measures 8.2 mmHg. Aortic valve area, by VTI measures 3.48 cm. Pulmonic Valve: The pulmonic valve was normal in structure. Pulmonic valve regurgitation is not visualized. Aorta: The ascending aorta was not well visualized. IAS/Shunts: No atrial level shunt detected by color flow Doppler.  LEFT VENTRICLE PLAX 2D LVIDd:         3.30 cm   Diastology LVIDs:         2.10 cm   LV e' medial:    4.68 cm/s LV PW:         1.20 cm   LV E/e' medial:  19.7 LV IVS:        1.20 cm   LV e' lateral:   13.30 cm/s LVOT diam:     2.00 cm   LV E/e' lateral: 6.9 LV SV:         70 LV SV Index:  47 LVOT Area:     3.14 cm  RIGHT VENTRICLE RV  Basal diam:  3.80 cm RV S prime:     13.60 cm/s TAPSE (M-mode): 1.6 cm LEFT ATRIUM             Index        RIGHT ATRIUM           Index LA diam:        2.10 cm 1.42 cm/m   RA Area:     13.80 cm LA Vol (A2C):   15.3 ml 10.35 ml/m  RA Volume:   35.30 ml  23.87 ml/m LA Vol (A4C):   22.2 ml 15.01 ml/m LA Biplane Vol: 19.6 ml 13.26 ml/m  AORTIC VALVE AV Area (Vmax):    2.65 cm AV Area (Vmean):   2.73 cm AV Area (VTI):     3.48 cm AV Vmax:           143.50 cm/s AV Vmean:          94.750 cm/s AV VTI:            0.202 m AV Peak Grad:      8.2 mmHg AV Mean Grad:      4.5 mmHg LVOT Vmax:         121.00 cm/s LVOT Vmean:        82.200 cm/s LVOT VTI:          0.223 m LVOT/AV VTI ratio: 1.11  AORTA Ao Root diam: 3.57 cm MITRAL VALVE                TRICUSPID VALVE MV Area (PHT): 3.63 cm     TR Peak grad:   40.4 mmHg MV Decel Time: 209 msec     TR Vmax:        318.00 cm/s MV E velocity: 92.10 cm/s MV A velocity: 102.00 cm/s  SHUNTS MV E/A ratio:  0.90         Systemic VTI:  0.22 m                             Systemic Diam: 2.00 cm Yolonda Kida MD Electronically signed by Yolonda Kida MD Signature Date/Time: 03/13/2022/12:56:43 PM    Final    [place] EKG SITE RITE  Result Date: 03/13/2022 If Site Rite image not attached, placement could not be confirmed due to current cardiac rhythm.  DG HIP UNILAT WITH PELVIS 2-3 VIEWS LEFT  Result Date: 03/13/2022 CLINICAL DATA:  Left hip fracture. EXAM: DG HIP (WITH OR WITHOUT PELVIS) 2-3V LEFT COMPARISON:  CT yesterday. FINDINGS: Displaced left femoral neck fracture with likely underlying lytic lesion. Femoral head remains seated. There is proximal migration of the femoral shaft. The question pathologic fracture at the right puboacetabular junction is tentatively visualized. Many of the additional bone lesions on CT are not well seen by radiograph. There is excreted IV contrast in the urinary bladder which appears trabeculated. IMPRESSION: Displaced left femoral neck  fracture with likely underlying lytic lesion. Possible pathologic fracture at the right puboacetabular junction on CT is tentatively visualized. Many of the additional bone lesions are not well seen by radiograph. Electronically Signed   By: Keith Rake M.D.   On: 03/13/2022 01:03   CT CHEST ABDOMEN PELVIS W CONTRAST  Result Date: 03/12/2022 CLINICAL DATA:  Metastatic disease evaluation. Osseous metastatic disease of the thoracic and lumbar spine. EXAM: CT CHEST, ABDOMEN, AND  PELVIS WITH CONTRAST TECHNIQUE: Multidetector CT imaging of the chest, abdomen and pelvis was performed following the standard protocol during bolus administration of intravenous contrast. RADIATION DOSE REDUCTION: This exam was performed according to the departmental dose-optimization program which includes automated exposure control, adjustment of the mA and/or kV according to patient size and/or use of iterative reconstruction technique. CONTRAST:  60m OMNIPAQUE IOHEXOL 300 MG/ML  SOLN COMPARISON:  Thoracic and lumbar spine MRI yesterday. Chest abdomen pelvis CT 12/06/2021 FINDINGS: CT CHEST FINDINGS Cardiovascular: Aortic atherosclerosis. Aortic tortuosity. The heart is normal in size. There are coronary artery calcifications. No pericardial effusion. Mediastinum/Nodes: Moderate-sized hiatal hernia. The esophagus is dilated and contains intraluminal debris to the level of the thoracic inlet. 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 The right thyroid gland may be surgically absent. No definite mediastinal or hilar adenopathy. Right axillary node measures 9 mm series 2, image 18. Lungs/Pleura: Small layering right pleural effusion. No convincing pleural nodularity or enhancement. Right lower lobe airspace disease is greater than typically seen with compressive atelectasis. Mild emphysema. Tiny 3 mm left lower lobe nodule series 4, image 84. This was previously obscured. Previous calcified  right lower lobe nodule is again seen, surrounded by adjacent atelectatic lung. Central bronchial thickening. There is layering debris/mucus within the distal trachea and right mainstem bronchus. Mild dependent atelectasis in the left lower lobe. Musculoskeletal: Multiple healing anterior rib fractures typically seen with CPR. Many of these demonstrate delayed union. Sternal body fracture may be pathologic with adjacent sclerosis. Known thoracic osseous metastatic disease was assessed on thoracic spine MRI yesterday. Pathologic T4 and T10 vertebral body compression fractures. Some of the lesions on MRI are visualized with lytic lesions, occasional areas of cortical sclerosis involving T3 and T4. There multiple small lucent rib lesions. The right subareolar breast tissue appears slightly lobulated, but is similar in appearance to prior exam. There is generalized body wall edema. Left shoulder arthroplasty. CT ABDOMEN PELVIS FINDINGS Hepatobiliary: Patient had numerous liver lesions on spine MRI. These lesions are difficult to visualize on the current exam, however there is a 2.8 cm lesion in the right hepatic dome. Multiple additional low-density lesions scattered throughout the liver, difficult to assess due to phase of contrast. Some of these lesions were previously characterized as hemangioma on MRI 11/02/2021, however some of these lesions are definitely new. Prior cholecystectomy. Common bile duct measures 9 mm proximally, 10 mm distally. There is a 7 mm stone in the distal common bile duct, series 5, image 58. There is slight central intrahepatic biliary ductal dilatation. Pancreas: 7 mm low-density in the pancreatic head, also seen on prior MRI, characterized as cyst. No evidence of suspicious pancreatic lesion. There is slight proximal ductal prominence of 4 mm. No peripancreatic inflammation. Spleen: Normal in size without focal abnormality. Adrenals/Urinary Tract: No adrenal nodule. Mild left renal atrophy.  Tiny hypodensities in the renal cortices are too small to characterize but likely small cysts. No solid renal lesion. No hydronephrosis. No bladder wall thickening. No dense of bladder lesion. Stomach/Bowel: Bowel assessment is limited in the absence of enteric contrast. There is a moderate hiatal hernia. Mild gastric wall hyperemia. No small bowel obstruction or inflammation. Normal appendix. Multifocal colonic diverticulosis. Stool distends the rectum. Circumferential anorectal thickening again seen, series 2, image 107. Vascular/Lymphatic: Aortic atherosclerosis. No aortic aneurysm. No definite abdominopelvic adenopathy. Reproductive: Atrophic uterus is tentatively visualized. No evidence of adnexal mass. Other: There is generalized edema of the subcutaneous and intra-abdominal fat. No  convincing abdominopelvic ascites. No omental thickening. Musculoskeletal: There is a displaced left femoral neck fracture, presumably pathologic. Possible pathologic fracture of the right superior puboacetabular junction innumerable osseous metastasis throughout the pelvis, multiple areas of low-density as well as trabecular coarsening. Lumbar metastatic disease better assessed on lumbar spine yesterday remote L5 compression fracture. IMPRESSION: 1. Osseous metastatic disease throughout the chest, abdomen, and pelvis, as described. 2. Multiple hepatic lesions which are poorly defined on the current exam due to phase of contrast, some of which are new from prior imaging and suspicious for metastatic disease. 3. Circumferential anorectal wall thickening, also seen on prior exam, recommend direct visualization. 4. Small layering right pleural effusion. Right lower lobe airspace disease is greater than typically seen with compressive atelectasis. This may be secondary to pneumonia, however aspiration is considered given there is layering debris in the right mainstem bronchus. No obvious pulmonary mass. 5. Choledocholithiasis with 7 mm  stone in the distal common bile duct. Mild biliary ductal dilatation. 6. Moderate hiatal hernia with dilated esophagus and intraluminal debris to the level of the thoracic inlet. 7. Colonic diverticulosis without diverticulitis. 8. 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 9. Displaced left femoral neck fracture, presumably pathologic. Possible pathologic fracture of the right superior puboacetabular junction, possible pathologic fracture of the sternum. 10. Generalized body wall edema. Aortic Atherosclerosis (ICD10-I70.0) and Emphysema (ICD10-J43.9). Electronically Signed   By: Keith Rake M.D.   On: 03/12/2022 22:10   DG Chest Portable 1 View  Result Date: 03/12/2022 CLINICAL DATA:  Shortness of breath. EXAM: PORTABLE CHEST 1 VIEW COMPARISON:  Chest radiograph dated 12/07/2021. FINDINGS: Small right pleural effusion and left lung base atelectasis or infiltrate. Probable left lung base atelectasis/scarring. No pneumothorax. The cardiac silhouette is within limits. No acute osseous pathology. Osteopenia. Left shoulder arthroplasty. IMPRESSION: Probable small right pleural effusion and right lung base atelectasis or infiltrate. Electronically Signed   By: Anner Crete M.D.   On: 03/12/2022 21:14   MR THORACIC SPINE WO CONTRAST  Result Date: 03/12/2022 CLINICAL DATA:  Mid and lower back pain. EXAM: MRI THORACIC AND LUMBAR SPINE WITHOUT CONTRAST TECHNIQUE: Multiplanar and multiecho pulse sequences of the thoracic and lumbar spine were obtained without intravenous contrast. COMPARISON:  CT chest abdomen pelvis April 13, 23. MRI February 12, 23. FINDINGS: MRI THORACIC SPINE FINDINGS Alignment:  No substantial sagittal subluxation Vertebrae: Extensive/numerous T1 hypointense and STIR hyperintense lesions throughout the visualized lower cervical and thoracic spine, compatible with osseous metastatic disease. Lesions involve both the vertebral bodies as well as the  posterior elements. Complete replacement of the bone marrow at T3 and T4. Height loss of the T4 and T10 vertebral bodies is compatible with pathologic fractures, new since CT on April 13, 23. Cord:  Normal cord signal. Paraspinal and other soft tissues: Layering right pleural effusion. Suspected dilated, fluid-filled esophagus. Disc levels: Poor definition of bone marrow cortex at T3 and T4 where there is complete marrow replacement, suggesting possible extraosseous extension of tumor although evaluation is limited without contrast. MRI LUMBAR SPINE FINDINGS Segmentation:  Standard. Alignment: Grade 1 retrolisthesis of L1 on L2 and L2 on L3 and grade 1 anterolisthesis of L5 on S1. Vertebrae: Numerous T1 hypointense and STIR hyperintense lesions throughout the visualized lumbar spine and sacrum, concerning for osseous metastatic disease. Remote L5 compression fracture with similar height loss. Conus medullaris and cauda equina: Conus extends to the T12 level. Conus appears normal. Paraspinal and other soft tissues: Limited intra-abdominal evaluation due to  motion/artifact. Numerous hepatic lesions on the coronal T2 image. Disc levels: T12-L1: No significant disc protrusion, foraminal stenosis, or canal stenosis. L1-L2: Posterior disc osteophyte complex and facet arthropathy. Moderate right foraminal stenosis. Right subarticular stenosis with otherwise patent canal. L2-L3: Disc height loss. Disc bulging and endplate spurring. Similar moderate right and mild left foraminal stenosis with out significant canal stenosis. L3-L4: Right eccentric disc bulging with bilateral facet arthropathy. Similar moderate to severe canal stenosis with severe bilateral subarticular recess stenosis. Similar mild-to-moderate right foraminal stenosis. L4-L5: Mild disc bulging bilateral facet arthropathy. Moderate left and mild right foraminal stenosis, similar. L5-S1: Facet arthropathy and mild disc bulging. Mild left foraminal stenosis,  similar. IMPRESSION: 1. Findings compatible with extensive osseous metastatic disease throughout the visualized cervical, thoracic, and lumbar spine, as well as the sacrum. 2. Pathologic fractures of T4 and T10, new since CT on December 06 2021. Similar height loss of an L5 compression fracture. 3. Poor definition of bone marrow cortex at T3 and T4 where there is complete marrow replacement, suggesting possible extraosseous extension of tumor although evaluation is limited without contrast. Postcontrast imaging could further characterize if clinically warranted. 4. Multilevel degenerative change, greatest in the lumbar spine with similar moderate to severe canal stenosis at L3-L4 and multilevel foraminal stenosis, detailed above. 5. Partially imaged numerous hepatic lesions, suspicious for metastatic disease given above findings. Also, layering right pleural effusion and suspected dilated fluid-filled esophagus. Recommend dedicated chest/abdominal/pelvic imaging to further evaluate disease burden and better evaluate for suspected primary malignancy. These results will be called to the ordering clinician or representative by the Radiologist Assistant, and communication documented in the PACS or [employer]. Electronically Signed   By: Margaretha Sheffield M.D.   On: 03/12/2022 13:57   MR LUMBAR SPINE WO CONTRAST  Result Date: 03/12/2022 CLINICAL DATA:  Mid and lower back pain. EXAM: MRI THORACIC AND LUMBAR SPINE WITHOUT CONTRAST TECHNIQUE: Multiplanar and multiecho pulse sequences of the thoracic and lumbar spine were obtained without intravenous contrast. COMPARISON:  CT chest abdomen pelvis April 13, 23. MRI February 12, 23. FINDINGS: MRI THORACIC SPINE FINDINGS Alignment:  No substantial sagittal subluxation Vertebrae: Extensive/numerous T1 hypointense and STIR hyperintense lesions throughout the visualized lower cervical and thoracic spine, compatible with osseous metastatic disease. Lesions involve both the  vertebral bodies as well as the posterior elements. Complete replacement of the bone marrow at T3 and T4. Height loss of the T4 and T10 vertebral bodies is compatible with pathologic fractures, new since CT on April 13, 23. Cord:  Normal cord signal. Paraspinal and other soft tissues: Layering right pleural effusion. Suspected dilated, fluid-filled esophagus. Disc levels: Poor definition of bone marrow cortex at T3 and T4 where there is complete marrow replacement, suggesting possible extraosseous extension of tumor although evaluation is limited without contrast. MRI LUMBAR SPINE FINDINGS Segmentation:  Standard. Alignment: Grade 1 retrolisthesis of L1 on L2 and L2 on L3 and grade 1 anterolisthesis of L5 on S1. Vertebrae: Numerous T1 hypointense and STIR hyperintense lesions throughout the visualized lumbar spine and sacrum, concerning for osseous metastatic disease. Remote L5 compression fracture with similar height loss. Conus medullaris and cauda equina: Conus extends to the T12 level. Conus appears normal. Paraspinal and other soft tissues: Limited intra-abdominal evaluation due to motion/artifact. Numerous hepatic lesions on the coronal T2 image. Disc levels: T12-L1: No significant disc protrusion, foraminal stenosis, or canal stenosis. L1-L2: Posterior disc osteophyte complex and facet arthropathy. Moderate right foraminal stenosis. Right subarticular stenosis with otherwise patent canal. L2-L3: Disc  height loss. Disc bulging and endplate spurring. Similar moderate right and mild left foraminal stenosis with out significant canal stenosis. L3-L4: Right eccentric disc bulging with bilateral facet arthropathy. Similar moderate to severe canal stenosis with severe bilateral subarticular recess stenosis. Similar mild-to-moderate right foraminal stenosis. L4-L5: Mild disc bulging bilateral facet arthropathy. Moderate left and mild right foraminal stenosis, similar. L5-S1: Facet arthropathy and mild disc bulging.  Mild left foraminal stenosis, similar. IMPRESSION: 1. Findings compatible with extensive osseous metastatic disease throughout the visualized cervical, thoracic, and lumbar spine, as well as the sacrum. 2. Pathologic fractures of T4 and T10, new since CT on December 06 2021. Similar height loss of an L5 compression fracture. 3. Poor definition of bone marrow cortex at T3 and T4 where there is complete marrow replacement, suggesting possible extraosseous extension of tumor although evaluation is limited without contrast. Postcontrast imaging could further characterize if clinically warranted. 4. Multilevel degenerative change, greatest in the lumbar spine with similar moderate to severe canal stenosis at L3-L4 and multilevel foraminal stenosis, detailed above. 5. Partially imaged numerous hepatic lesions, suspicious for metastatic disease given above findings. Also, layering right pleural effusion and suspected dilated fluid-filled esophagus. Recommend dedicated chest/abdominal/pelvic imaging to further evaluate disease burden and better evaluate for suspected primary malignancy. These results will be called to the ordering clinician or representative by the Radiologist Assistant, and communication documented in the PACS or [employer]. Electronically Signed   By: Margaretha Sheffield M.D.   On: 03/12/2022 13:57    Positive ROS: All other systems have been reviewed and were otherwise negative with the exception of those mentioned in the HPI and as above.  Physical Exam: General: Alert, no acute distress  MUSCULOSKELETAL: Left lower extremity: Patient has intact skin overlying the left hip.  There is no erythema ecchymosis or swelling.  Her thigh and leg compartments are soft and compressible.  She has palpable pedal pulses, intact station light touch and intact motor function distally.  Patient has shortening of the left lower extremity with external rotation deformity.  Assessment: Left displaced femoral  neck hip fracture, likely pathologic  Plan: Patient has evidence radiographically of diffuse metastatic disease.  I communicated via Epic text with Dr. Janese Banks from oncology regarding this patient and she suspects the primary is likely breast cancer.  I discussed the fracture with the patient and her sister.  I am recommending hemiarthroplasty as treatment for her fracture which will improve her left hip pain and provide an opportunity to return to weightbearing and ambulation.  Bone and soft tissue biopsies will be sent during the case.  Patient had an episode of PEA during a cholecystectomy in April.  Cardiology has been consulted to assist with preoperative clearance.  Patient's hemoglobin this morning was 8.5.  She was ordered for a unit of PRBCs in preparation for possible surgery tomorrow ending medical clearance.  I discussed the risks and benefits of surgery and nonoperative management with the patient and her sister. The risks of surgery include but are not limited to infection, bleeding requiring blood transfusion, nerve or blood vessel injury including injury to the sciatic nerve, joint stiffness or loss of motion, persistent pain, weakness or instability, leg length discrepancy, change in lower extremity rotation, dislocation, fracture and the need for further surgery. Medical risks include but are not limited to DVT and pulmonary embolism, myocardial infarction, stroke, pneumonia, respiratory failure and death.  Patient understands that without surgery, the fracture will not heal and she will not be able to  weight-bear on the left lower extremity long-term.  She will likely have persistent left hip pain and is at risk for aspiration pneumonia, pressure ulcers and DVT and pulmonary embolism.  Patient and her sister understood these risks and wished to proceed with a left hip hemiarthroplasty.    The choice of anesthesia includes general versus spinal anesthetic.  The anesthesia service will  recommend what ever they feel it is safest for the patient.  I will defer to their expertise in this regard.  I have reviewed the patient's radiographic studies and labs in preparation for this case.  Patient will be n.p.o. after midnight.  She should not receive anticoagulation medication in preparation for surgery tomorrow morning pending medical clearance.  Patient will need to temporarily stop immunosuppressive medications perioperatively to reduce the risk for postoperative infection which includes her rheumatologic medications.     Thornton Park, MD    03/13/2022 3:05 PM

## 2022-03-13 NOTE — Assessment & Plan Note (Addendum)
Continue statin. 

## 2022-03-14 ENCOUNTER — Inpatient Hospital Stay: Payer: Medicare HMO | Admitting: Anesthesiology

## 2022-03-14 ENCOUNTER — Other Ambulatory Visit: Payer: Self-pay

## 2022-03-14 ENCOUNTER — Encounter
Admission: EM | Disposition: A | Discharge status: Hospice | Payer: Self-pay | Source: Home / Self Care | Attending: Internal Medicine

## 2022-03-14 ENCOUNTER — Encounter: Payer: Self-pay | Admitting: Family Medicine

## 2022-03-14 ENCOUNTER — Inpatient Hospital Stay: Payer: Medicare HMO

## 2022-03-14 DIAGNOSIS — F419 Anxiety disorder, unspecified: Secondary | ICD-10-CM | POA: Diagnosis not present

## 2022-03-14 DIAGNOSIS — M8458XA Pathological fracture in neoplastic disease, other specified site, initial encounter for fracture: Secondary | ICD-10-CM

## 2022-03-14 DIAGNOSIS — M8450XA Pathological fracture in neoplastic disease, unspecified site, initial encounter for fracture: Secondary | ICD-10-CM | POA: Diagnosis not present

## 2022-03-14 DIAGNOSIS — N390 Urinary tract infection, site not specified: Secondary | ICD-10-CM | POA: Diagnosis present

## 2022-03-14 DIAGNOSIS — N3000 Acute cystitis without hematuria: Secondary | ICD-10-CM

## 2022-03-14 DIAGNOSIS — K8050 Calculus of bile duct without cholangitis or cholecystitis without obstruction: Secondary | ICD-10-CM | POA: Diagnosis not present

## 2022-03-14 DIAGNOSIS — E785 Hyperlipidemia, unspecified: Secondary | ICD-10-CM | POA: Diagnosis not present

## 2022-03-14 HISTORY — PX: HIP ARTHROPLASTY: SHX981

## 2022-03-14 LAB — CBC WITH DIFFERENTIAL/PLATELET
Abs Immature Granulocytes: 0.05 10*3/uL (ref 0.00–0.07)
Basophils Absolute: 0 10*3/uL (ref 0.0–0.1)
Basophils Relative: 1 %
Eosinophils Absolute: 0.1 10*3/uL (ref 0.0–0.5)
Eosinophils Relative: 1 %
HCT: 28.7 % — ABNORMAL LOW (ref 36.0–46.0)
Hemoglobin: 9.6 g/dL — ABNORMAL LOW (ref 12.0–15.0)
Immature Granulocytes: 1 %
Lymphocytes Relative: 6 %
Lymphs Abs: 0.3 10*3/uL — ABNORMAL LOW (ref 0.7–4.0)
MCH: 29.6 pg (ref 26.0–34.0)
MCHC: 33.4 g/dL (ref 30.0–36.0)
MCV: 88.6 fL (ref 80.0–100.0)
Monocytes Absolute: 1.1 10*3/uL — ABNORMAL HIGH (ref 0.1–1.0)
Monocytes Relative: 20 %
Neutro Abs: 3.7 10*3/uL (ref 1.7–7.7)
Neutrophils Relative %: 71 %
Platelets: 396 10*3/uL (ref 150–400)
RBC: 3.24 MIL/uL — ABNORMAL LOW (ref 3.87–5.11)
RDW: 18.1 % — ABNORMAL HIGH (ref 11.5–15.5)
WBC: 5.3 10*3/uL (ref 4.0–10.5)
nRBC: 0.4 % — ABNORMAL HIGH (ref 0.0–0.2)

## 2022-03-14 LAB — COMPREHENSIVE METABOLIC PANEL
ALT: 21 U/L (ref 0–44)
AST: 38 U/L (ref 15–41)
Albumin: 2.2 g/dL — ABNORMAL LOW (ref 3.5–5.0)
Alkaline Phosphatase: 149 U/L — ABNORMAL HIGH (ref 38–126)
Anion gap: 10 (ref 5–15)
BUN: 11 mg/dL (ref 8–23)
CO2: 21 mmol/L — ABNORMAL LOW (ref 22–32)
Calcium: 7.8 mg/dL — ABNORMAL LOW (ref 8.9–10.3)
Chloride: 105 mmol/L (ref 98–111)
Creatinine, Ser: 0.58 mg/dL (ref 0.44–1.00)
GFR, Estimated: >60 mL/min (ref >60)
Glucose, Bld: 94 mg/dL (ref 70–99)
Potassium: 3.7 mmol/L (ref 3.5–5.1)
Sodium: 136 mmol/L (ref 135–145)
Total Bilirubin: 0.8 mg/dL (ref 0.3–1.2)
Total Protein: 5.1 g/dL — ABNORMAL LOW (ref 6.5–8.1)

## 2022-03-14 LAB — SYNOVIAL FLUID, CRYSTAL: Crystals, Fluid: NONE SEEN

## 2022-03-14 LAB — MAGNESIUM: Magnesium: 1.9 mg/dL (ref 1.7–2.4)

## 2022-03-14 LAB — URINE CULTURE

## 2022-03-14 LAB — LEGIONELLA PNEUMOPHILA SEROGP 1 UR AG: L. pneumophila Serogp 1 Ur Ag: NEGATIVE

## 2022-03-14 LAB — PHOSPHORUS: Phosphorus: 2.9 mg/dL (ref 2.5–4.6)

## 2022-03-14 LAB — CANCER ANTIGEN 15-3: CA 15-3: 530 U/mL — ABNORMAL HIGH (ref 0.0–25.0)

## 2022-03-14 LAB — CANCER ANTIGEN 27.29: CA 27.29: 594.5 U/mL — ABNORMAL HIGH (ref 0.0–38.6)

## 2022-03-14 SURGERY — HEMIARTHROPLASTY, HIP, DIRECT ANTERIOR APPROACH, FOR FRACTURE
Anesthesia: General | Site: Hip | Laterality: Left

## 2022-03-14 MED ORDER — HYDROMORPHONE HCL 1 MG/ML IJ SOLN: 0.5000 mg | INTRAMUSCULAR | Status: DC | PRN

## 2022-03-14 MED ORDER — FENTANYL CITRATE (PF) 100 MCG/2ML IJ SOLN
INTRAMUSCULAR | Status: DC | PRN
  Administered 2022-03-14: 50 ug via INTRAVENOUS

## 2022-03-14 MED ORDER — DOCUSATE SODIUM 100 MG PO CAPS
100.0000 mg | ORAL_CAPSULE | Freq: Two times a day (BID) | ORAL | Status: DC
  Administered 2022-03-14 – 2022-03-19 (×5): 100 mg via ORAL
  Filled 2022-03-14 (×8): qty 1

## 2022-03-14 MED ORDER — BISACODYL 10 MG RE SUPP: 10.0000 mg | Freq: Every day | RECTAL | Status: DC | PRN

## 2022-03-14 MED ORDER — GUAIFENESIN ER 600 MG PO TB12
1200.0000 mg | ORAL_TABLET | Freq: Two times a day (BID) | ORAL | Status: DC
  Administered 2022-03-14 – 2022-03-19 (×11): 1200 mg via ORAL
  Filled 2022-03-14 (×12): qty 2

## 2022-03-14 MED ORDER — ROCURONIUM BROMIDE 10 MG/ML (PF) SYRINGE
PREFILLED_SYRINGE | INTRAVENOUS | Status: AC
  Filled 2022-03-14: qty 10

## 2022-03-14 MED ORDER — VASOPRESSIN 20 UNIT/ML IV SOLN
INTRAVENOUS | Status: AC
  Filled 2022-03-14: qty 1

## 2022-03-14 MED ORDER — ACETAMINOPHEN 10 MG/ML IV SOLN
INTRAVENOUS | Status: DC | PRN
  Administered 2022-03-14: 1000 mg via INTRAVENOUS

## 2022-03-14 MED ORDER — CEFAZOLIN SODIUM-DEXTROSE 2-4 GM/100ML-% IV SOLN
INTRAVENOUS | Status: AC
  Filled 2022-03-14: qty 100

## 2022-03-14 MED ORDER — ALUM & MAG HYDROXIDE-SIMETH 200-200-20 MG/5ML PO SUSP: 30.0000 mL | ORAL | Status: DC | PRN

## 2022-03-14 MED ORDER — ROCURONIUM BROMIDE 100 MG/10ML IV SOLN
INTRAVENOUS | Status: DC | PRN
  Administered 2022-03-14: 40 mg via INTRAVENOUS
  Administered 2022-03-14: 20 mg via INTRAVENOUS
  Administered 2022-03-14: 10 mg via INTRAVENOUS

## 2022-03-14 MED ORDER — OXYCODONE HCL 5 MG PO TABS: 5.0000 mg | ORAL_TABLET | ORAL | Status: DC | PRN

## 2022-03-14 MED ORDER — POTASSIUM CHLORIDE IN NACL 20-0.45 MEQ/L-% IV SOLN
INTRAVENOUS | Status: DC
  Filled 2022-03-14 (×2): qty 1000

## 2022-03-14 MED ORDER — SENNA 8.6 MG PO TABS
1.0000 | ORAL_TABLET | Freq: Two times a day (BID) | ORAL | Status: DC
Start: 2022-03-14 — End: 2022-03-19
  Administered 2022-03-14 – 2022-03-15 (×4): 8.6 mg via ORAL
  Filled 2022-03-14 (×8): qty 1

## 2022-03-14 MED ORDER — FENTANYL CITRATE (PF) 100 MCG/2ML IJ SOLN
INTRAMUSCULAR | Status: AC
  Filled 2022-03-14: qty 2

## 2022-03-14 MED ORDER — NEOMYCIN-POLYMYXIN B GU 40-200000 IR SOLN
Status: AC
  Filled 2022-03-14: qty 20

## 2022-03-14 MED ORDER — ONDANSETRON HCL 4 MG/2ML IJ SOLN
INTRAMUSCULAR | Status: DC | PRN
  Administered 2022-03-14: 4 mg via INTRAVENOUS

## 2022-03-14 MED ORDER — DEXAMETHASONE SODIUM PHOSPHATE 10 MG/ML IJ SOLN
INTRAMUSCULAR | Status: DC | PRN
  Administered 2022-03-14: 10 mg via INTRAVENOUS

## 2022-03-14 MED ORDER — LIDOCAINE HCL (CARDIAC) PF 100 MG/5ML IV SOSY
PREFILLED_SYRINGE | INTRAVENOUS | Status: DC | PRN
  Administered 2022-03-14: 60 mg via INTRAVENOUS

## 2022-03-14 MED ORDER — IPRATROPIUM-ALBUTEROL 0.5-2.5 (3) MG/3ML IN SOLN: 3.0000 mL | Freq: Once | RESPIRATORY_TRACT | Status: AC

## 2022-03-14 MED ORDER — ALBUMIN HUMAN 5 % IV SOLN: INTRAVENOUS | Status: DC | PRN

## 2022-03-14 MED ORDER — PHENYLEPHRINE HCL-NACL 20-0.9 MG/250ML-% IV SOLN
INTRAVENOUS | Status: DC | PRN
  Administered 2022-03-14: 70 ug/min via INTRAVENOUS
  Administered 2022-03-14: 50 ug/min via INTRAVENOUS

## 2022-03-14 MED ORDER — ONDANSETRON HCL 4 MG PO TABS: 4.0000 mg | ORAL_TABLET | Freq: Four times a day (QID) | ORAL | Status: DC | PRN

## 2022-03-14 MED ORDER — SUGAMMADEX SODIUM 200 MG/2ML IV SOLN
INTRAVENOUS | Status: DC | PRN
  Administered 2022-03-14: 200 mg via INTRAVENOUS

## 2022-03-14 MED ORDER — IPRATROPIUM-ALBUTEROL 0.5-2.5 (3) MG/3ML IN SOLN
RESPIRATORY_TRACT | Status: AC
  Administered 2022-03-14: 3 mL via RESPIRATORY_TRACT
  Filled 2022-03-14: qty 3

## 2022-03-14 MED ORDER — OXYCODONE HCL 5 MG/5ML PO SOLN: 5.0000 mg | Freq: Once | ORAL | Status: DC | PRN

## 2022-03-14 MED ORDER — POLYETHYLENE GLYCOL 3350 17 G PO PACK: 17.0000 g | PACK | Freq: Every day | ORAL | Status: DC | PRN

## 2022-03-14 MED ORDER — ONDANSETRON HCL 4 MG/2ML IJ SOLN
INTRAMUSCULAR | Status: AC
Start: 2022-03-14 — End: ?
  Filled 2022-03-14: qty 2

## 2022-03-14 MED ORDER — NAPHAZOLINE-PHENIRAMINE 0.025-0.3 % OP SOLN
1.0000 [drp] | Freq: Four times a day (QID) | OPHTHALMIC | Status: DC | PRN
  Administered 2022-03-14: 1 [drp] via OPHTHALMIC
  Filled 2022-03-14: qty 5

## 2022-03-14 MED ORDER — PROPOFOL 10 MG/ML IV BOLUS
INTRAVENOUS | Status: DC | PRN
  Administered 2022-03-14: 140 mg via INTRAVENOUS

## 2022-03-14 MED ORDER — SODIUM CHLORIDE 0.9 % IV SOLN
Status: DC | PRN
  Administered 2022-03-14: 3000 mL

## 2022-03-14 MED ORDER — ACETAMINOPHEN 10 MG/ML IV SOLN
INTRAVENOUS | Status: AC
  Filled 2022-03-14: qty 100

## 2022-03-14 MED ORDER — PHENYLEPHRINE 80 MCG/ML (10ML) SYRINGE FOR IV PUSH (FOR BLOOD PRESSURE SUPPORT)
PREFILLED_SYRINGE | INTRAVENOUS | Status: AC
  Filled 2022-03-14: qty 10

## 2022-03-14 MED ORDER — ONDANSETRON HCL 4 MG/2ML IJ SOLN
4.0000 mg | Freq: Four times a day (QID) | INTRAMUSCULAR | Status: DC | PRN
  Administered 2022-03-15: 4 mg via INTRAVENOUS
  Filled 2022-03-14: qty 2

## 2022-03-14 MED ORDER — DEXAMETHASONE SODIUM PHOSPHATE 10 MG/ML IJ SOLN
INTRAMUSCULAR | Status: AC
  Filled 2022-03-14: qty 1

## 2022-03-14 MED ORDER — OXYCODONE HCL 5 MG PO TABS: 10.0000 mg | ORAL_TABLET | ORAL | Status: DC | PRN

## 2022-03-14 MED ORDER — FENTANYL CITRATE (PF) 100 MCG/2ML IJ SOLN: 25.0000 ug | INTRAMUSCULAR | Status: DC | PRN

## 2022-03-14 MED ORDER — SUCCINYLCHOLINE CHLORIDE 200 MG/10ML IV SOSY
PREFILLED_SYRINGE | INTRAVENOUS | Status: AC
  Filled 2022-03-14: qty 10

## 2022-03-14 MED ORDER — OXYCODONE HCL 5 MG PO TABS: 5.0000 mg | ORAL_TABLET | Freq: Once | ORAL | Status: DC | PRN

## 2022-03-14 MED ORDER — MENTHOL 3 MG MT LOZG: 1.0000 | LOZENGE | OROMUCOSAL | Status: DC | PRN

## 2022-03-14 MED ORDER — TRAMADOL HCL 50 MG PO TABS
50.0000 mg | ORAL_TABLET | Freq: Four times a day (QID) | ORAL | Status: DC
  Administered 2022-03-14 – 2022-03-19 (×18): 50 mg via ORAL
  Filled 2022-03-14 (×19): qty 1

## 2022-03-14 MED ORDER — EPHEDRINE 5 MG/ML INJ
INTRAVENOUS | Status: AC
Start: 2022-03-14 — End: ?
  Filled 2022-03-14: qty 5

## 2022-03-14 MED ORDER — ENOXAPARIN SODIUM 40 MG/0.4ML IJ SOSY
40.0000 mg | PREFILLED_SYRINGE | INTRAMUSCULAR | Status: DC
  Administered 2022-03-15: 40 mg via SUBCUTANEOUS
  Filled 2022-03-14: qty 0.4

## 2022-03-14 MED ORDER — ACETAMINOPHEN 500 MG PO TABS
1000.0000 mg | ORAL_TABLET | Freq: Four times a day (QID) | ORAL | Status: AC
  Administered 2022-03-14 – 2022-03-15 (×3): 1000 mg via ORAL
  Filled 2022-03-14 (×4): qty 2

## 2022-03-14 MED ORDER — PHENYLEPHRINE HCL (PRESSORS) 10 MG/ML IV SOLN
INTRAVENOUS | Status: DC | PRN
  Administered 2022-03-14 (×6): 160 ug via INTRAVENOUS

## 2022-03-14 MED ORDER — SUCCINYLCHOLINE CHLORIDE 200 MG/10ML IV SOSY
PREFILLED_SYRINGE | INTRAVENOUS | Status: DC | PRN
  Administered 2022-03-14: 80 mg via INTRAVENOUS

## 2022-03-14 MED ORDER — LORAZEPAM 0.5 MG PO TABS: 0.5000 mg | ORAL_TABLET | Freq: Two times a day (BID) | ORAL | Status: DC | PRN

## 2022-03-14 MED ORDER — EPHEDRINE SULFATE (PRESSORS) 50 MG/ML IJ SOLN
INTRAMUSCULAR | Status: DC | PRN
  Administered 2022-03-14 (×2): 10 mg via INTRAVENOUS

## 2022-03-14 MED ORDER — PHENOL 1.4 % MT LIQD: 1.0000 | OROMUCOSAL | Status: DC | PRN

## 2022-03-14 MED ORDER — CEFAZOLIN SODIUM-DEXTROSE 2-4 GM/100ML-% IV SOLN
2.0000 g | Freq: Four times a day (QID) | INTRAVENOUS | Status: AC
  Administered 2022-03-14 (×2): 2 g via INTRAVENOUS
  Filled 2022-03-14 (×2): qty 100

## 2022-03-14 SURGICAL SUPPLY — 56 items
BLADE SAGITTAL WIDE XTHICK NO (BLADE) ×2 IMPLANT
BLADE SURG SZ10 CARB STEEL (BLADE) ×2 IMPLANT
BNDG COHESIVE 4X5 TAN ST LF (GAUZE/BANDAGES/DRESSINGS) ×2 IMPLANT
BOWL CEMENT MIXING ADV NOZZLE (MISCELLANEOUS) ×1 IMPLANT
CEMENT BONE 40GM (Cement) ×2 IMPLANT
COVER BACK TABLE REUSABLE LG (DRAPES) ×2 IMPLANT
DRAPE 3/4 80X56 (DRAPES) ×4 IMPLANT
DRAPE INCISE IOBAN 66X60 STRL (DRAPES) ×2 IMPLANT
DRAPE ORTHO SPLIT 77X108 STRL (DRAPES) ×2
DRAPE SURG 17X11 SM STRL (DRAPES) ×2 IMPLANT
DRAPE SURG ORHT 6 SPLT 77X108 (DRAPES) ×2 IMPLANT
DRSG MEPILEX SACRM 8.7X9.8 (GAUZE/BANDAGES/DRESSINGS) ×1 IMPLANT
DURAPREP 26ML APPLICATOR (WOUND CARE) ×8 IMPLANT
ELECT CAUTERY BLADE 6.4 (BLADE) ×2 IMPLANT
ELECT REM PT RETURN 9FT ADLT (ELECTROSURGICAL) ×2
ELECTRODE REM PT RTRN 9FT ADLT (ELECTROSURGICAL) ×1 IMPLANT
GAUZE SPONGE 4X4 12PLY STRL (GAUZE/BANDAGES/DRESSINGS) ×1 IMPLANT
GAUZE XEROFORM 1X8 LF (GAUZE/BANDAGES/DRESSINGS) ×3 IMPLANT
GLOVE BIOGEL PI IND STRL 9 (GLOVE) ×1 IMPLANT
GLOVE BIOGEL PI INDICATOR 9 (GLOVE) ×1
GLOVE SURG ORTHO 9.0 STRL STRW (GLOVE) ×6 IMPLANT
GOWN STRL REUS TWL 2XL XL LVL4 (GOWN DISPOSABLE) ×3 IMPLANT
GOWN STRL REUS W/ TWL LRG LVL3 (GOWN DISPOSABLE) ×1 IMPLANT
GOWN STRL REUS W/TWL LRG LVL3 (GOWN DISPOSABLE) ×1
HEAD MODULAR ENDO (Orthopedic Implant) ×1 IMPLANT
HEAD UNPLR 44XMDLR STRL HIP (Orthopedic Implant) IMPLANT
HOLSTER ELECTROSUGICAL PENCIL (MISCELLANEOUS) ×2 IMPLANT
IV NS IRRIG 3000ML ARTHROMATIC (IV SOLUTION) ×2 IMPLANT
KIT PREP HIP W/CEMENT RESTRICT (Miscellaneous) IMPLANT
KIT PREPARATION TOTAL HIP (Miscellaneous) ×1 IMPLANT
KIT TURNOVER KIT A (KITS) ×2 IMPLANT
MANIFOLD NEPTUNE II (INSTRUMENTS) ×3 IMPLANT
NDL FILTER BLUNT 18X1 1/2 (NEEDLE) ×1 IMPLANT
NDL MAYO CATGUT SZ4 TPR NDL (NEEDLE) ×1 IMPLANT
NDL SAFETY ECLIPSE 18X1.5 (NEEDLE) ×1 IMPLANT
NEEDLE FILTER BLUNT 18X 1/2SAF (NEEDLE) ×1
NEEDLE FILTER BLUNT 18X1 1/2 (NEEDLE) ×1 IMPLANT
NEEDLE HYPO 18GX1.5 SHARP (NEEDLE) ×1
NEEDLE MAYO CATGUT SZ4 (NEEDLE) ×2 IMPLANT
NS IRRIG 1000ML POUR BTL (IV SOLUTION) ×2 IMPLANT
PACK HIP PROSTHESIS (MISCELLANEOUS) ×2 IMPLANT
PILLOW ABDUCTION FOAM SM (MISCELLANEOUS) ×2 IMPLANT
PULSAVAC PLUS IRRIG FAN TIP (DISPOSABLE) ×2
SLEEVE UNITRAX V40 (Orthopedic Implant) ×1 IMPLANT
SLEEVE UNITRAX V40 +12 (Orthopedic Implant) IMPLANT
SPACER OSTEO DISTAL (Orthopedic Implant) ×1 IMPLANT
SPONGE T-LAP 18X18 ~~LOC~~+RFID (SPONGE) ×8 IMPLANT
STAPLER SKIN PROX 35W (STAPLE) ×2 IMPLANT
STEM FEM CMT 43X131X35 SZ3 (Stem) ×1 IMPLANT
SUT TICRON 2-0 30IN 311381 (SUTURE) ×9 IMPLANT
SUT VIC AB 0 CT1 36 (SUTURE) ×3 IMPLANT
SUT VIC AB 2-0 CT2 27 (SUTURE) ×5 IMPLANT
SYR 10ML LL (SYRINGE) ×2 IMPLANT
TIP BRUSH PULSAVAC PLUS 24.33 (MISCELLANEOUS) ×2 IMPLANT
TIP FAN IRRIG PULSAVAC PLUS (DISPOSABLE) ×1 IMPLANT
WATER STERILE IRR 500ML POUR (IV SOLUTION) ×2 IMPLANT

## 2022-03-14 NOTE — Anesthesia Postprocedure Evaluation (Signed)
Anesthesia Post Note  Patient: Imo Cumbie Meinzer  Procedure(s) Performed: ARTHROPLASTY BIPOLAR HIP (HEMIARTHROPLASTY) (Left: Hip)  Patient location during evaluation: PACU Anesthesia Type: Combined General/Spinal Level of consciousness: awake and alert Pain management: pain level controlled Vital Signs Assessment: post-procedure vital signs reviewed and stable Respiratory status: spontaneous breathing, nonlabored ventilation, respiratory function stable and patient connected to nasal cannula oxygen Cardiovascular status: blood pressure returned to baseline and stable Postop Assessment: no apparent nausea or vomiting Anesthetic complications: no   No notable events documented.   Last Vitals:  Vitals:   03/14/22 1400 03/14/22 1415  BP: 93/68 (!) 83/58  Pulse: 97 100  Resp: 15 16  Temp:    SpO2: 91% 95%    Last Pain:  Vitals:   03/14/22 1400  TempSrc:   PainSc: 0-No pain                 Dimas Millin

## 2022-03-14 NOTE — Progress Notes (Signed)
Subjective:  POST OP CHECK s/p left hip hemiarthroplasty.   Patient reports left hip pain as mild to moderate.  Patient's sister is at the bedside.  Patient complaining of irritation of her right eye.  Objective:   VITALS:   Vitals:   03/14/22 1429 03/14/22 1430 03/14/22 1445 03/14/22 1521  BP: 90/60 101/74 (!) 96/51 (!) 129/59  Pulse: 96 96 99 97  Resp: '18 19 20 18  '$ Temp:   (!) 97.3 F (36.3 C) 98.1 F (36.7 C)  TempSrc:    Oral  SpO2: 96% 97% 98% 98%  Weight:      Height:        PHYSICAL EXAM: Left lower extremity Neurovascular intact Sensation intact distally Intact pulses distally Dorsiflexion/Plantar flexion intact Incision: dressing C/D/I No cellulitis present Compartment soft  LABS  Results for orders placed or performed during the hospital encounter of 03/12/22 (from the past 24 hour(s))  Magnesium     Status: None   Collection Time: 03/14/22  5:55 AM  Result Value Ref Range   Magnesium 1.9 1.7 - 2.4 mg/dL  Phosphorus     Status: None   Collection Time: 03/14/22  5:55 AM  Result Value Ref Range   Phosphorus 2.9 2.5 - 4.6 mg/dL  CBC with Differential/Platelet     Status: Abnormal   Collection Time: 03/14/22  5:55 AM  Result Value Ref Range   WBC 5.3 4.0 - 10.5 K/uL   RBC 3.24 (L) 3.87 - 5.11 MIL/uL   Hemoglobin 9.6 (L) 12.0 - 15.0 g/dL   HCT 28.7 (L) 36.0 - 46.0 %   MCV 88.6 80.0 - 100.0 fL   MCH 29.6 26.0 - 34.0 pg   MCHC 33.4 30.0 - 36.0 g/dL   RDW 18.1 (H) 11.5 - 15.5 %   Platelets 396 150 - 400 K/uL   nRBC 0.4 (H) 0.0 - 0.2 %   Neutrophils Relative % 71 %   Neutro Abs 3.7 1.7 - 7.7 K/uL   Lymphocytes Relative 6 %   Lymphs Abs 0.3 (L) 0.7 - 4.0 K/uL   Monocytes Relative 20 %   Monocytes Absolute 1.1 (H) 0.1 - 1.0 K/uL   Eosinophils Relative 1 %   Eosinophils Absolute 0.1 0.0 - 0.5 K/uL   Basophils Relative 1 %   Basophils Absolute 0.0 0.0 - 0.1 K/uL   Immature Granulocytes 1 %   Abs Immature Granulocytes 0.05 0.00 - 0.07 K/uL   Comprehensive metabolic panel     Status: Abnormal   Collection Time: 03/14/22  9:15 AM  Result Value Ref Range   Sodium 136 135 - 145 mmol/L   Potassium 3.7 3.5 - 5.1 mmol/L   Chloride 105 98 - 111 mmol/L   CO2 21 (L) 22 - 32 mmol/L   Glucose, Bld 94 70 - 99 mg/dL   BUN 11 8 - 23 mg/dL   Creatinine, Ser 0.58 0.44 - 1.00 mg/dL   Calcium 7.8 (L) 8.9 - 10.3 mg/dL   Total Protein 5.1 (L) 6.5 - 8.1 g/dL   Albumin 2.2 (L) 3.5 - 5.0 g/dL   AST 38 15 - 41 U/L   ALT 21 0 - 44 U/L   Alkaline Phosphatase 149 (H) 38 - 126 U/L   Total Bilirubin 0.8 0.3 - 1.2 mg/dL   GFR, Estimated >60 >60 mL/min   Anion gap 10 5 - 15  Aerobic/Anaerobic Culture w Gram Stain (surgical/deep wound)     Status: None (Preliminary result)   Collection Time: 03/14/22  11:57 AM   Specimen: PATH Other; Body Fluid  Result Value Ref Range   Specimen Description      HIP LEFT FEMORAL FLUID Performed at [facility], [place] [address], [place], [place] [address]    Special Requests      NONE Performed at [facility], [address] [place], [place] [address]    Gram Stain PENDING    Culture PENDING    Report Status PENDING   Synovial fluid, crystal     Status: None   Collection Time: 03/14/22 12:01 PM  Result Value Ref Range   Crystals, Fluid NO CRYSTALS SEEN     DG Hip Port Unilat With Pelvis 1V Left  Result Date: 03/14/2022 CLINICAL DATA:  Status post left hip replacement EXAM: DG HIP (WITH OR WITHOUT PELVIS) 1V PORT LEFT COMPARISON:  None Available. FINDINGS: Interval postsurgical changes from left total hip arthroplasty for left femoral neck fracture. Arthroplasty components appear in their expected alignment. Numerous scattered lytic lesions compatible with known diffuse osseous metastatic disease. Expected postoperative changes within the overlying soft tissues. IMPRESSION: Postsurgical changes from left total hip arthroplasty. Electronically Signed   By: Yetta Glassman M.D.   On: 03/14/2022  14:54   DG Knee Complete 4 Views Right  Result Date: 03/13/2022 CLINICAL DATA:  Bilateral knee pain EXAM: RIGHT KNEE - COMPLETE 4+ VIEW COMPARISON:  None Available. FINDINGS: Alignment is anatomic. No joint effusion. No acute fracture. No significant joint space narrowing. Vascular calcifications. IMPRESSION: No significant osseous abnormality. Electronically Signed   By: Macy Mis M.D.   On: 03/13/2022 13:05   DG Knee Complete 4 Views Left  Result Date: 03/13/2022 CLINICAL DATA:  Left knee injury. EXAM: LEFT KNEE - COMPLETE 4+ VIEW COMPARISON:  None Available. FINDINGS: No evidence of fracture, dislocation, or joint effusion. No evidence of arthropathy or other focal bone abnormality. Soft tissue edema superior to the left knee. IMPRESSION: 1. No acute fracture or dislocation identified about the left knee. 2. Soft tissue edema superior to the left knee. Electronically Signed   By: Fidela Salisbury M.D.   On: 03/13/2022 13:04   ECHOCARDIOGRAM COMPLETE  Result Date: 03/13/2022    ECHOCARDIOGRAM REPORT   Patient Name:   [facility] Date of Exam: 03/13/2022 Medical Rec #:  [id number]      Height:       61.0 in Accession #:    [id number]     Weight:       112.4 lb Date of Birth:  Aug 13, 1951      BSA:          1.479 m Patient Age:    71 years       BP:           126/79 mmHg Patient Gender: F              HR:           100 bpm. Exam Location:  [facility] Procedure: 2D Echo, Color Doppler and Cardiac Doppler Indications:     CAD native vessel I25.10  History:         Patient has prior history of Echocardiogram examinations, most                  recent 12/06/2021. CAD; Risk Factors:Hypertension and Current                  Smoker. Pneumonia.  Sonographer:     Sherrie Sport Referring Phys:  [id number] [place] TANG Diagnosing Phys: Yolonda Kida MD  Sonographer Comments: Suboptimal apical window and suboptimal subcostal window. Image acquisition challenging due to COPD. IMPRESSIONS  1. Left ventricular  ejection fraction, by estimation, is 65 to 70%. The left ventricle has normal function. The left ventricle has no regional wall motion abnormalities. There is mild left ventricular hypertrophy. Left ventricular diastolic parameters are consistent with Grade I diastolic dysfunction (impaired relaxation).  2. Right ventricular systolic function is normal. The right ventricular size is normal.  3. The mitral valve is normal in structure. Trivial mitral valve regurgitation.  4. The aortic valve is normal in structure. Aortic valve regurgitation is not visualized. FINDINGS  Left Ventricle: Left ventricular ejection fraction, by estimation, is 65 to 70%. The left ventricle has normal function. The left ventricle has no regional wall motion abnormalities. The left ventricular internal cavity size was normal in size. There is  mild left ventricular hypertrophy. Left ventricular diastolic parameters are consistent with Grade I diastolic dysfunction (impaired relaxation). Right Ventricle: The right ventricular size is normal. No increase in right ventricular wall thickness. Right ventricular systolic function is normal. Left Atrium: Left atrial size was normal in size. Right Atrium: Right atrial size was normal in size. Pericardium: There is no evidence of pericardial effusion. Mitral Valve: The mitral valve is normal in structure. Trivial mitral valve regurgitation. Tricuspid Valve: The tricuspid valve is normal in structure. Tricuspid valve regurgitation is trivial. Aortic Valve: The aortic valve is normal in structure. Aortic valve regurgitation is not visualized. Aortic valve mean gradient measures 4.5 mmHg. Aortic valve peak gradient measures 8.2 mmHg. Aortic valve area, by VTI measures 3.48 cm. Pulmonic Valve: The pulmonic valve was normal in structure. Pulmonic valve regurgitation is not visualized. Aorta: The ascending aorta was not well visualized. IAS/Shunts: No atrial level shunt detected by color flow Doppler.   LEFT VENTRICLE PLAX 2D LVIDd:         3.30 cm   Diastology LVIDs:         2.10 cm   LV e' medial:    4.68 cm/s LV PW:         1.20 cm   LV E/e' medial:  19.7 LV IVS:        1.20 cm   LV e' lateral:   13.30 cm/s LVOT diam:     2.00 cm   LV E/e' lateral: 6.9 LV SV:         70 LV SV Index:   47 LVOT Area:     3.14 cm  RIGHT VENTRICLE RV Basal diam:  3.80 cm RV S prime:     13.60 cm/s TAPSE (M-mode): 1.6 cm LEFT ATRIUM             Index        RIGHT ATRIUM           Index LA diam:        2.10 cm 1.42 cm/m   RA Area:     13.80 cm LA Vol (A2C):   15.3 ml 10.35 ml/m  RA Volume:   35.30 ml  23.87 ml/m LA Vol (A4C):   22.2 ml 15.01 ml/m LA Biplane Vol: 19.6 ml 13.26 ml/m  AORTIC VALVE AV Area (Vmax):    2.65 cm AV Area (Vmean):   2.73 cm AV Area (VTI):     3.48 cm AV Vmax:           143.50 cm/s AV Vmean:  94.750 cm/s AV VTI:            0.202 m AV Peak Grad:      8.2 mmHg AV Mean Grad:      4.5 mmHg LVOT Vmax:         121.00 cm/s LVOT Vmean:        82.200 cm/s LVOT VTI:          0.223 m LVOT/AV VTI ratio: 1.11  AORTA Ao Root diam: 3.57 cm MITRAL VALVE                TRICUSPID VALVE MV Area (PHT): 3.63 cm     TR Peak grad:   40.4 mmHg MV Decel Time: 209 msec     TR Vmax:        318.00 cm/s MV E velocity: 92.10 cm/s MV A velocity: 102.00 cm/s  SHUNTS MV E/A ratio:  0.90         Systemic VTI:  0.22 m                             Systemic Diam: 2.00 cm Yolonda Kida MD Electronically signed by Yolonda Kida MD Signature Date/Time: 03/13/2022/12:56:43 PM    Final    [place] EKG SITE RITE  Result Date: 03/13/2022 If Site Rite image not attached, placement could not be confirmed due to current cardiac rhythm.  DG HIP UNILAT WITH PELVIS 2-3 VIEWS LEFT  Result Date: 03/13/2022 CLINICAL DATA:  Left hip fracture. EXAM: DG HIP (WITH OR WITHOUT PELVIS) 2-3V LEFT COMPARISON:  CT yesterday. FINDINGS: Displaced left femoral neck fracture with likely underlying lytic lesion. Femoral head remains seated. There is  proximal migration of the femoral shaft. The question pathologic fracture at the right puboacetabular junction is tentatively visualized. Many of the additional bone lesions on CT are not well seen by radiograph. There is excreted IV contrast in the urinary bladder which appears trabeculated. IMPRESSION: Displaced left femoral neck fracture with likely underlying lytic lesion. Possible pathologic fracture at the right puboacetabular junction on CT is tentatively visualized. Many of the additional bone lesions are not well seen by radiograph. Electronically Signed   By: Keith Rake M.D.   On: 03/13/2022 01:03   CT CHEST ABDOMEN PELVIS W CONTRAST  Result Date: 03/12/2022 CLINICAL DATA:  Metastatic disease evaluation. Osseous metastatic disease of the thoracic and lumbar spine. EXAM: CT CHEST, ABDOMEN, AND PELVIS WITH CONTRAST TECHNIQUE: Multidetector CT imaging of the chest, abdomen and pelvis was performed following the standard protocol during bolus administration of intravenous contrast. RADIATION DOSE REDUCTION: This exam was performed according to the departmental dose-optimization program which includes automated exposure control, adjustment of the mA and/or kV according to patient size and/or use of iterative reconstruction technique. CONTRAST:  49m OMNIPAQUE IOHEXOL 300 MG/ML  SOLN COMPARISON:  Thoracic and lumbar spine MRI yesterday. Chest abdomen pelvis CT 12/06/2021 FINDINGS: CT CHEST FINDINGS Cardiovascular: Aortic atherosclerosis. Aortic tortuosity. The heart is normal in size. There are coronary artery calcifications. No pericardial effusion. Mediastinum/Nodes: Moderate-sized hiatal hernia. The esophagus is dilated and contains intraluminal debris to the level of the thoracic inlet. 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 The right thyroid gland may be surgically absent. No definite mediastinal or hilar adenopathy. Right axillary node measures 9 mm  series 2, image 18. Lungs/Pleura: Small layering right pleural effusion. No convincing pleural nodularity or enhancement. Right lower lobe airspace disease is  greater than typically seen with compressive atelectasis. Mild emphysema. Tiny 3 mm left lower lobe nodule series 4, image 84. This was previously obscured. Previous calcified right lower lobe nodule is again seen, surrounded by adjacent atelectatic lung. Central bronchial thickening. There is layering debris/mucus within the distal trachea and right mainstem bronchus. Mild dependent atelectasis in the left lower lobe. Musculoskeletal: Multiple healing anterior rib fractures typically seen with CPR. Many of these demonstrate delayed union. Sternal body fracture may be pathologic with adjacent sclerosis. Known thoracic osseous metastatic disease was assessed on thoracic spine MRI yesterday. Pathologic T4 and T10 vertebral body compression fractures. Some of the lesions on MRI are visualized with lytic lesions, occasional areas of cortical sclerosis involving T3 and T4. There multiple small lucent rib lesions. The right subareolar breast tissue appears slightly lobulated, but is similar in appearance to prior exam. There is generalized body wall edema. Left shoulder arthroplasty. CT ABDOMEN PELVIS FINDINGS Hepatobiliary: Patient had numerous liver lesions on spine MRI. These lesions are difficult to visualize on the current exam, however there is a 2.8 cm lesion in the right hepatic dome. Multiple additional low-density lesions scattered throughout the liver, difficult to assess due to phase of contrast. Some of these lesions were previously characterized as hemangioma on MRI 11/02/2021, however some of these lesions are definitely new. Prior cholecystectomy. Common bile duct measures 9 mm proximally, 10 mm distally. There is a 7 mm stone in the distal common bile duct, series 5, image 58. There is slight central intrahepatic biliary ductal dilatation. Pancreas:  7 mm low-density in the pancreatic head, also seen on prior MRI, characterized as cyst. No evidence of suspicious pancreatic lesion. There is slight proximal ductal prominence of 4 mm. No peripancreatic inflammation. Spleen: Normal in size without focal abnormality. Adrenals/Urinary Tract: No adrenal nodule. Mild left renal atrophy. Tiny hypodensities in the renal cortices are too small to characterize but likely small cysts. No solid renal lesion. No hydronephrosis. No bladder wall thickening. No dense of bladder lesion. Stomach/Bowel: Bowel assessment is limited in the absence of enteric contrast. There is a moderate hiatal hernia. Mild gastric wall hyperemia. No small bowel obstruction or inflammation. Normal appendix. Multifocal colonic diverticulosis. Stool distends the rectum. Circumferential anorectal thickening again seen, series 2, image 107. Vascular/Lymphatic: Aortic atherosclerosis. No aortic aneurysm. No definite abdominopelvic adenopathy. Reproductive: Atrophic uterus is tentatively visualized. No evidence of adnexal mass. Other: There is generalized edema of the subcutaneous and intra-abdominal fat. No convincing abdominopelvic ascites. No omental thickening. Musculoskeletal: There is a displaced left femoral neck fracture, presumably pathologic. Possible pathologic fracture of the right superior puboacetabular junction innumerable osseous metastasis throughout the pelvis, multiple areas of low-density as well as trabecular coarsening. Lumbar metastatic disease better assessed on lumbar spine yesterday remote L5 compression fracture. IMPRESSION: 1. Osseous metastatic disease throughout the chest, abdomen, and pelvis, as described. 2. Multiple hepatic lesions which are poorly defined on the current exam due to phase of contrast, some of which are new from prior imaging and suspicious for metastatic disease. 3. Circumferential anorectal wall thickening, also seen on prior exam, recommend direct  visualization. 4. Small layering right pleural effusion. Right lower lobe airspace disease is greater than typically seen with compressive atelectasis. This may be secondary to pneumonia, however aspiration is considered given there is layering debris in the right mainstem bronchus. No obvious pulmonary mass. 5. Choledocholithiasis with 7 mm stone in the distal common bile duct. Mild biliary ductal dilatation. 6. Moderate hiatal hernia with dilated esophagus  and intraluminal debris to the level of the thoracic inlet. 7. Colonic diverticulosis without diverticulitis. 8. 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 9. Displaced left femoral neck fracture, presumably pathologic. Possible pathologic fracture of the right superior puboacetabular junction, possible pathologic fracture of the sternum. 10. Generalized body wall edema. Aortic Atherosclerosis (ICD10-I70.0) and Emphysema (ICD10-J43.9). Electronically Signed   By: Keith Rake M.D.   On: 03/12/2022 22:10   DG Chest Portable 1 View  Result Date: 03/12/2022 CLINICAL DATA:  Shortness of breath. EXAM: PORTABLE CHEST 1 VIEW COMPARISON:  Chest radiograph dated 12/07/2021. FINDINGS: Small right pleural effusion and left lung base atelectasis or infiltrate. Probable left lung base atelectasis/scarring. No pneumothorax. The cardiac silhouette is within limits. No acute osseous pathology. Osteopenia. Left shoulder arthroplasty. IMPRESSION: Probable small right pleural effusion and right lung base atelectasis or infiltrate. Electronically Signed   By: Anner Crete M.D.   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  Patient's pain is currently controlled on current pain regimen.  She will receive 24 hours of postop antibiotics.  Foley catheter will be removed in the morning.  Patient begin physical therapy tomorrow.  Patient is weightbearing as tolerated with posterior hip precautions on the left lower extremity.  Pathology and microbiology results from surgical specimens are pending.  Will begin Lovenox for DVT prophylaxis tomorrow.  Reviewed the postoperative x-rays which demonstrate the cemented left hip hemiarthroplasty prosthesis is well-positioned.  Leg lengths appear equivalent.  There is no evidence of postop complication.   Thornton Park , MD 03/14/2022, 4:03 PM

## 2022-03-14 NOTE — Transfer of Care (Signed)
Immediate Anesthesia Transfer of Care Note  Patient: Kathleen Blankenship  Procedure(s) Performed: ARTHROPLASTY BIPOLAR HIP (HEMIARTHROPLASTY) (Left: Hip)  Patient Location: PACU  Anesthesia Type:General  Level of Consciousness: drowsy  Airway & Oxygen Therapy: Patient Spontanous Breathing and Patient connected to face mask  Post-op Assessment: Report given to RN and Post -op Vital signs reviewed and stable  Post vital signs: Reviewed and stable  Last Vitals:  Vitals Value Taken Time  BP 180/140 03/14/22 1330  Temp    Pulse 68 03/14/22 1333  Resp 15 03/14/22 1333  SpO2 96 % 03/14/22 1333  Vitals shown include unvalidated device data.  Last Pain:  Vitals:   03/14/22 0957  TempSrc: Temporal  PainSc: 8          Complications: No notable events documented.

## 2022-03-14 NOTE — Progress Notes (Signed)
PROGRESS NOTE    Kathleen Blankenship  TWS:[id number] DOB: 08-07-1951 DOA: 03/12/2022 PCP: Tracie Harrier, MD    Chief Complaint  Patient presents with   Urinary Retention   Back Pain    Brief Narrative:  Patient is a pleasant 71 year old female, history of CAD, GERD, hypertension, PEA cardiac arrest postoperatively after lap chole on 11/2011, underwent cardiac cath 2018 with insignificant CAD, ongoing tobacco use presented to [facility] 03/12/2022 with progressive worsening back and left hip pain times several weeks with difficulty ambulating.  MRI was done for further evaluation of her pain and resulted in extensive osseous metastatic disease.  CT of the L-spine done showed pathologic fractures of T4 and T10, CT chest abdomen and pelvis as well as plain films of the left hip and pelvis consistent with a displaced left femoral neck fracture.  Patient noted to also have a 30 pound unintentional weight loss over the last 6 months, difficulty emptying bladder with urgency, mild dysuria.  Patient noted to have also had an epidural injection on 02/07/2022.  Patient seen in the ED labs showed hyponatremia with a sodium of 130, elevated alk phosphatase of 174, albumin of 2.5, AST of 53, CT chest abdomen and pelvis with osseous metastatic disease throughout chest abdomen and pelvis, multiple hepatic lesions which are poorly defined, circumferential anorectal wall thickening, small layering right pleural effusion, right lower lobe airspace disease greater than typically seen with compressive atelectasis may be secondary to pneumonia versus aspiration, choledocholithiasis with 7 mm stone in the distal common bile duct, mild biliary ductal dilatation, moderate hiatal hernia with dilated esophagus and intraluminal debris to the level of the thoracic inlet, colonic diverticulosis, 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   Orthopedics consulted.  Oncology consulted.  Cardiology consulted for preop clearance.    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 - Patient admitted to [place] floor and subsequently placed on telemetry. -Pain management to be provided. - Orthopedic (Dr Leamon Arnt) consultation obtained, patient assessed by orthopedics and recommended transfusion of a unit of packed red blood cells in anticipation for possible surgery today 03/14/2022, recommending hemiarthroplasty as treatment for her fracture which will help improve her left hip pain and opportunity to return to weightbearing and ambulation. -Oncology ( Dr Janese Banks) consulted . -Cardiology consulted for preop clearance.  -Patient for left hemiarthroplasty today.  Dyslipidemia - Statin.  Pleural effusion on right - This could be associated with right lower lobe airspace disease/pneumonia. - She does have cough however without fever or leukocytosis and she is a chronic smoker. -Patient with concern for metastatic disease, pathologic fracture, recent weight loss, noted to be on [place], [place] and likely immunocompromised and as such we will place empirically on IV antibiotics for probable pneumonia.   -Arava on hold. -Supportive care  UTI (urinary tract  infection) - Urinalysis concerning for UTI with moderate leukocytes, positive nitrite, many bacteria. -Urine cultures pending. -Currently on IV Zosyn.  Anemia - Patient with no overt bleeding. -Anemia panel consistent with anemia of chronic disease.  -Status post transfuse 1 unit packed red blood cells in anticipation of orthopedic surgery today as recommended by orthopedic surgeon. -Hemoglobin stable at 9.6. -Follow H&H.  Metastatic cancer to bone [facility]) - Patient being followed by oncology. -Oncology concerned primary likely from the breast. -Patient for Left pathologic femur fracture repair and hopefully tissue biopsy will be obtained. -Breast tumor markers ordered per oncology. -See problem #1 above -Per oncology.  Protein-calorie malnutrition, severe -Continue nutritional supplementation.  PNA (pneumonia) - Patient with history of rheumatoid arthritis on Rinvoq and leflunomide with concerns for metastatic Pathologic Fracture. -CT chest with small layering right pleural effusion, right lower lobe airspace disease greater than typically seen with compressive atelectasis may be secondary to pneumonia however aspiration is considered given layering debris in the right mainstem bronchus.  No obvious pulmonary mass. -Patient also with moderate hiatal hernia with dilated esophagus and intraluminal debris to the level of the thoracic inlet noted. -Urine strep pneumococcus antigen negative.  Urine Legionella antigen pending.   -Continue IV Zosyn.   -Add Mucinex.    GERD (gastroesophageal reflux disease) Continue PPI therapy.  Anxiety and depression -Continue home regimen Zoloft. -Noted on med rec patient was not taking Valium. -Place on Ativan 0.5 mg twice daily as needed.  Essential hypertension -Continue home regimen Toprol-XL, Cozaar.  Choledocholithiasis - This is currently asymptomatic -Follow-up.  Rheumatoid arthritis ([place]) - The patient is on Rinvoq and  leflunomide. -Leflunomide held. -Outpatient follow-up.         DVT prophylaxis: SCDs, postop DVT prophylaxis per orthopedics. Code Status: DNR Family Communication: Updated patient, sister at bedside. Disposition: TBD  Status is: Inpatient Remains inpatient appropriate because: Severity of illness   Consultants:  Oncology: Dr.Rao 03/13/2022 Orthopedics: Dr. Mack Guise 03/13/2022 Cardiology: Orinda Kenner, PA 03/13/2022 Neurosurgery: Dr. Cari Caraway 03/14/2022   Procedures:  CT chest abdomen and pelvis 03/12/2022 Chest x-ray 03/12/2022 Plain films of the left hip and pelvis 03/13/2022 Plain films of the left knee 03/13/2022 2D echo 03/13/2022 PICC line placement 03/13/2022    Antimicrobials:  IV Zosyn 03/13/2022>>>>>   Subjective: Laying in bed.  Sister at bedside.  Feeling anxious about all the information she has had over the past 24 hours.  No chest pain.  No shortness of breath.  Feels left lower extremity swelling has improved.  Still with some pain in the left hip but better today per patient.  Stated saw neurosurgeon.   .  Objective: Vitals:   03/14/22 0123 03/14/22 0500 03/14/22 0835 03/14/22 0957  BP: 96/79 100/80 (!) 134/100 (!) 149/78  Pulse: 71 75 (!) 115 (!) 40  Resp: 20 20 20 16   Temp: 98.1 F (36.7 C) 98 F (36.7 C) 98.6 F (37 C) 97.6 F (36.4 C)  TempSrc:    Temporal  SpO2: 95% 97% 93% 95%  Weight:    51 kg  Height:    5' 1"  (1.549 m)    Intake/Output Summary (Last 24 hours) at 03/14/2022 1141 Last data filed at 03/14/2022 1052 Gross per 24 hour  Intake 1438.96 ml  Output 1450 ml  Net -11.04 ml   Filed Weights   03/12/22 2027 03/14/22 0957  Weight: 51 kg 51 kg    Examination:  General exam: NAD Respiratory system: CTA B anterior lung fields.  No wheezes, no crackles, no rhonchi.  Normal respiratory effort.  Cardiovascular system: Tachycardia, no murmurs rubs or gallops.  No JVD.  No significant lower extremity edema.   Gastrointestinal system:  Abdomen is nondistended, soft and nontender. No organomegaly or masses felt. Normal bowel sounds heard. Central nervous system: Alert and oriented. No focal neurological deficits. Extremities: Left lower extremity slightly shortened and externally rotated.  Decreased tenderness to palpation of the left hip.  No significant edema noted.  Skin: No rashes, lesions or ulcers Psychiatry: Judgement and insight appear normal. Mood & affect appropriate.     Data Reviewed:   CBC: Recent Labs  Lab 03/12/22 2025 03/13/22 0149 03/14/22 0555  WBC 9.6 6.5 5.3  NEUTROABS 7.5  --  3.7  HGB 8.2* 8.5* 9.6*  HCT 25.8* 25.5* 28.7*  MCV 92.8 90.7 88.6  PLT 498* 384 725    Basic Metabolic Panel: Recent Labs  Lab 03/12/22 2025 03/13/22 0149 03/14/22 0555 03/14/22 0915  NA 130* 132*  --  136  K 4.4 4.6  --  3.7  CL 100 103  --  105  CO2 19* 23  --  21*  GLUCOSE 105* 124*  --  94  BUN 20 19  --  11  CREATININE 0.60 0.74  --  0.58  CALCIUM 8.2* 8.0*  --  7.8*  MG  --   --  1.9  --   PHOS  --   --  2.9  --     GFR: Estimated Creatinine Clearance: 50.1 mL/min (by C-G formula based on SCr of 0.58 mg/dL).  Liver Function Tests: Recent Labs  Lab 03/12/22 2025 03/13/22 0149 03/14/22 0915  AST 53* 53* 38  ALT 21 22 21   ALKPHOS 174* 187* 149*  BILITOT 1.0 0.7 0.8  PROT 5.5* 5.8* 5.1*  ALBUMIN 2.5* 2.5* 2.2*    CBG: No results for input(s): "GLUCAP" in the last 168 hours.   Recent Results (from the past 240 hour(s))  Blood culture (routine x 2)     Status: None (Preliminary result)   Collection Time: 03/12/22  8:25 PM   Specimen: BLOOD  Result Value Ref Range Status   Specimen Description BLOOD BLOOD LEFT FOREARM  Final   Special Requests   Final    BOTTLES DRAWN AEROBIC AND ANAEROBIC Blood Culture results may not be optimal due to an inadequate volume of blood received in culture bottles   Culture   Final    NO GROWTH 2 DAYS Performed at [facility], [address] [place], [place] [address]    Report Status PENDING  Incomplete  Blood culture (routine x 2)     Status: None (Preliminary result)   Collection Time: 03/12/22  8:25 PM   Specimen: BLOOD  Result Value Ref Range Status   Specimen Description BLOOD BLOOD LEFT HAND  Final   Special Requests   Final    BOTTLES DRAWN AEROBIC AND ANAEROBIC Blood Culture results may not be optimal due to an inadequate volume of blood received in culture bottles   Culture   Final    NO GROWTH 2 DAYS Performed at [facility], [address] [place], [place] [address]    Report Status PENDING  Incomplete  Expectorated Sputum Assessment w Gram Stain, Rflx to Resp Cult     Status: None   Collection Time: 03/13/22 10:40 AM   Specimen: Expectorated Sputum  Result Value Ref Range Status   Specimen Description EXPECTORATED SPUTUM  Final  Special Requests NONE  Final   Sputum evaluation   Final    THIS SPECIMEN IS ACCEPTABLE FOR SPUTUM CULTURE Performed at [facility], [address] [place], [place] [address]    Report Status 03/13/2022 FINAL  Final  Culture, Respiratory w Gram Stain     Status: None (Preliminary result)   Collection Time: 03/13/22 10:40 AM  Result Value Ref Range Status   Specimen Description   Final    EXPECTORATED SPUTUM Performed at [facility], [address], [place], [place] [address]    Special Requests   Final    NONE Reflexed from [id number] Performed at [facility], [address] [place], [place] [address]    Gram Stain   Final    ABUNDANT WBC PRESENT, PREDOMINANTLY PMN ABUNDANT GRAM NEGATIVE RODS FEW GRAM POSITIVE COCCI RARE BUDDING YEAST SEEN Performed at [facility], [place] [address], [place], [place] [address]    Culture PENDING  Incomplete   Report Status PENDING  Incomplete  Urine Culture     Status: Abnormal   Collection Time: 03/13/22 10:43 AM   Specimen: Urine, Catheterized  Result Value Ref Range Status    Specimen Description   Final    URINE, CATHETERIZED Performed at [facility], [address], [place], [place] [address]    Special Requests   Final    NONE Performed at [facility], [address] [place], [place] [address]    Culture MULTIPLE SPECIES PRESENT, SUGGEST RECOLLECTION (A)  Final   Report Status 03/14/2022 FINAL  Final         Radiology Studies: DG Knee Complete 4 Views Right  Result Date: 03/13/2022 CLINICAL DATA:  Bilateral knee pain EXAM: RIGHT KNEE - COMPLETE 4+ VIEW COMPARISON:  None Available. FINDINGS: Alignment is anatomic. No joint effusion. No acute fracture. No significant joint space narrowing. Vascular calcifications. IMPRESSION: No significant osseous abnormality. Electronically Signed   By: Macy Mis M.D.   On: 03/13/2022 13:05   DG Knee Complete 4 Views Left  Result Date: 03/13/2022 CLINICAL DATA:  Left knee injury. EXAM: LEFT KNEE - COMPLETE 4+ VIEW COMPARISON:  None Available. FINDINGS: No evidence of fracture, dislocation, or joint effusion. No evidence of arthropathy or other focal bone abnormality. Soft tissue edema superior to the left knee. IMPRESSION: 1. No acute fracture or dislocation identified about the left knee. 2. Soft tissue edema superior to the left knee. Electronically Signed   By: Fidela Salisbury M.D.   On: 03/13/2022 13:04   ECHOCARDIOGRAM COMPLETE  Result Date: 03/13/2022    ECHOCARDIOGRAM REPORT   Patient Name:   BESSE MIRON [facility] Date of Exam: 03/13/2022 Medical Rec #:  [id number]      Height:       61.0 in Accession #:    [id number]     Weight:       112.4 lb Date of Birth:  01/21/1951      BSA:          1.479 m Patient Age:    34 years       BP:           126/79 mmHg Patient Gender: F              HR:           100 bpm. Exam Location:  [facility] Procedure: 2D Echo, Color Doppler and Cardiac Doppler Indications:     CAD native vessel I25.10  History:  Patient has prior history of Echocardiogram examinations,  most                  recent 12/06/2021. CAD; Risk Factors:Hypertension and Current                  Smoker. Pneumonia.  Sonographer:     Sherrie Sport Referring Phys:  [id number] Weston TANG Diagnosing Phys: Yolonda Kida MD  Sonographer Comments: Suboptimal apical window and suboptimal subcostal window. Image acquisition challenging due to COPD. IMPRESSIONS  1. Left ventricular ejection fraction, by estimation, is 65 to 70%. The left ventricle has normal function. The left ventricle has no regional wall motion abnormalities. There is mild left ventricular hypertrophy. Left ventricular diastolic parameters are consistent with Grade I diastolic dysfunction (impaired relaxation).  2. Right ventricular systolic function is normal. The right ventricular size is normal.  3. The mitral valve is normal in structure. Trivial mitral valve regurgitation.  4. The aortic valve is normal in structure. Aortic valve regurgitation is not visualized. FINDINGS  Left Ventricle: Left ventricular ejection fraction, by estimation, is 65 to 70%. The left ventricle has normal function. The left ventricle has no regional wall motion abnormalities. The left ventricular internal cavity size was normal in size. There is  mild left ventricular hypertrophy. Left ventricular diastolic parameters are consistent with Grade I diastolic dysfunction (impaired relaxation). Right Ventricle: The right ventricular size is normal. No increase in right ventricular wall thickness. Right ventricular systolic function is normal. Left Atrium: Left atrial size was normal in size. Right Atrium: Right atrial size was normal in size. Pericardium: There is no evidence of pericardial effusion. Mitral Valve: The mitral valve is normal in structure. Trivial mitral valve regurgitation. Tricuspid Valve: The tricuspid valve is normal in structure. Tricuspid valve regurgitation is trivial. Aortic Valve: The aortic valve is normal in structure. Aortic valve  regurgitation is not visualized. Aortic valve mean gradient measures 4.5 mmHg. Aortic valve peak gradient measures 8.2 mmHg. Aortic valve area, by VTI measures 3.48 cm. Pulmonic Valve: The pulmonic valve was normal in structure. Pulmonic valve regurgitation is not visualized. Aorta: The ascending aorta was not well visualized. IAS/Shunts: No atrial level shunt detected by color flow Doppler.  LEFT VENTRICLE PLAX 2D LVIDd:         3.30 cm   Diastology LVIDs:         2.10 cm   LV e' medial:    4.68 cm/s LV PW:         1.20 cm   LV E/e' medial:  19.7 LV IVS:        1.20 cm   LV e' lateral:   13.30 cm/s LVOT diam:     2.00 cm   LV E/e' lateral: 6.9 LV SV:         70 LV SV Index:   47 LVOT Area:     3.14 cm  RIGHT VENTRICLE RV Basal diam:  3.80 cm RV S prime:     13.60 cm/s TAPSE (M-mode): 1.6 cm LEFT ATRIUM             Index        RIGHT ATRIUM           Index LA diam:        2.10 cm 1.42 cm/m   RA Area:     13.80 cm LA Vol (A2C):   15.3 ml 10.35 ml/m  RA Volume:   35.30 ml  23.87 ml/m LA  Vol (A4C):   22.2 ml 15.01 ml/m LA Biplane Vol: 19.6 ml 13.26 ml/m  AORTIC VALVE AV Area (Vmax):    2.65 cm AV Area (Vmean):   2.73 cm AV Area (VTI):     3.48 cm AV Vmax:           143.50 cm/s AV Vmean:          94.750 cm/s AV VTI:            0.202 m AV Peak Grad:      8.2 mmHg AV Mean Grad:      4.5 mmHg LVOT Vmax:         121.00 cm/s LVOT Vmean:        82.200 cm/s LVOT VTI:          0.223 m LVOT/AV VTI ratio: 1.11  AORTA Ao Root diam: 3.57 cm MITRAL VALVE                TRICUSPID VALVE MV Area (PHT): 3.63 cm     TR Peak grad:   40.4 mmHg MV Decel Time: 209 msec     TR Vmax:        318.00 cm/s MV E velocity: 92.10 cm/s MV A velocity: 102.00 cm/s  SHUNTS MV E/A ratio:  0.90         Systemic VTI:  0.22 m                             Systemic Diam: 2.00 cm Yolonda Kida MD Electronically signed by Yolonda Kida MD Signature Date/Time: 03/13/2022/12:56:43 PM    Final    [place] EKG SITE RITE  Result Date: 03/13/2022 If  Site Rite image not attached, placement could not be confirmed due to current cardiac rhythm.  DG HIP UNILAT WITH PELVIS 2-3 VIEWS LEFT  Result Date: 03/13/2022 CLINICAL DATA:  Left hip fracture. EXAM: DG HIP (WITH OR WITHOUT PELVIS) 2-3V LEFT COMPARISON:  CT yesterday. FINDINGS: Displaced left femoral neck fracture with likely underlying lytic lesion. Femoral head remains seated. There is proximal migration of the femoral shaft. The question pathologic fracture at the right puboacetabular junction is tentatively visualized. Many of the additional bone lesions on CT are not well seen by radiograph. There is excreted IV contrast in the urinary bladder which appears trabeculated. IMPRESSION: Displaced left femoral neck fracture with likely underlying lytic lesion. Possible pathologic fracture at the right puboacetabular junction on CT is tentatively visualized. Many of the additional bone lesions are not well seen by radiograph. Electronically Signed   By: Keith Rake M.D.   On: 03/13/2022 01:03   CT CHEST ABDOMEN PELVIS W CONTRAST  Result Date: 03/12/2022 CLINICAL DATA:  Metastatic disease evaluation. Osseous metastatic disease of the thoracic and lumbar spine. EXAM: CT CHEST, ABDOMEN, AND PELVIS WITH CONTRAST TECHNIQUE: Multidetector CT imaging of the chest, abdomen and pelvis was performed following the standard protocol during bolus administration of intravenous contrast. RADIATION DOSE REDUCTION: This exam was performed according to the departmental dose-optimization program which includes automated exposure control, adjustment of the mA and/or kV according to patient size and/or use of iterative reconstruction technique. CONTRAST:  9m OMNIPAQUE IOHEXOL 300 MG/ML  SOLN COMPARISON:  Thoracic and lumbar spine MRI yesterday. Chest abdomen pelvis CT 12/06/2021 FINDINGS: CT CHEST FINDINGS Cardiovascular: Aortic atherosclerosis. Aortic tortuosity. The heart is normal in size. There are coronary artery  calcifications. No pericardial effusion. Mediastinum/Nodes: Moderate-sized hiatal hernia. The  esophagus is dilated and contains intraluminal debris to the level of the thoracic inlet. 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 The right thyroid gland may be surgically absent. No definite mediastinal or hilar adenopathy. Right axillary node measures 9 mm series 2, image 18. Lungs/Pleura: Small layering right pleural effusion. No convincing pleural nodularity or enhancement. Right lower lobe airspace disease is greater than typically seen with compressive atelectasis. Mild emphysema. Tiny 3 mm left lower lobe nodule series 4, image 84. This was previously obscured. Previous calcified right lower lobe nodule is again seen, surrounded by adjacent atelectatic lung. Central bronchial thickening. There is layering debris/mucus within the distal trachea and right mainstem bronchus. Mild dependent atelectasis in the left lower lobe. Musculoskeletal: Multiple healing anterior rib fractures typically seen with CPR. Many of these demonstrate delayed union. Sternal body fracture may be pathologic with adjacent sclerosis. Known thoracic osseous metastatic disease was assessed on thoracic spine MRI yesterday. Pathologic T4 and T10 vertebral body compression fractures. Some of the lesions on MRI are visualized with lytic lesions, occasional areas of cortical sclerosis involving T3 and T4. There multiple small lucent rib lesions. The right subareolar breast tissue appears slightly lobulated, but is similar in appearance to prior exam. There is generalized body wall edema. Left shoulder arthroplasty. CT ABDOMEN PELVIS FINDINGS Hepatobiliary: Patient had numerous liver lesions on spine MRI. These lesions are difficult to visualize on the current exam, however there is a 2.8 cm lesion in the right hepatic dome. Multiple additional low-density lesions scattered throughout the liver, difficult to  assess due to phase of contrast. Some of these lesions were previously characterized as hemangioma on MRI 11/02/2021, however some of these lesions are definitely new. Prior cholecystectomy. Common bile duct measures 9 mm proximally, 10 mm distally. There is a 7 mm stone in the distal common bile duct, series 5, image 58. There is slight central intrahepatic biliary ductal dilatation. Pancreas: 7 mm low-density in the pancreatic head, also seen on prior MRI, characterized as cyst. No evidence of suspicious pancreatic lesion. There is slight proximal ductal prominence of 4 mm. No peripancreatic inflammation. Spleen: Normal in size without focal abnormality. Adrenals/Urinary Tract: No adrenal nodule. Mild left renal atrophy. Tiny hypodensities in the renal cortices are too small to characterize but likely small cysts. No solid renal lesion. No hydronephrosis. No bladder wall thickening. No dense of bladder lesion. Stomach/Bowel: Bowel assessment is limited in the absence of enteric contrast. There is a moderate hiatal hernia. Mild gastric wall hyperemia. No small bowel obstruction or inflammation. Normal appendix. Multifocal colonic diverticulosis. Stool distends the rectum. Circumferential anorectal thickening again seen, series 2, image 107. Vascular/Lymphatic: Aortic atherosclerosis. No aortic aneurysm. No definite abdominopelvic adenopathy. Reproductive: Atrophic uterus is tentatively visualized. No evidence of adnexal mass. Other: There is generalized edema of the subcutaneous and intra-abdominal fat. No convincing abdominopelvic ascites. No omental thickening. Musculoskeletal: There is a displaced left femoral neck fracture, presumably pathologic. Possible pathologic fracture of the right superior puboacetabular junction innumerable osseous metastasis throughout the pelvis, multiple areas of low-density as well as trabecular coarsening. Lumbar metastatic disease better assessed on lumbar spine yesterday remote  L5 compression fracture. IMPRESSION: 1. Osseous metastatic disease throughout the chest, abdomen, and pelvis, as described. 2. Multiple hepatic lesions which are poorly defined on the current exam due to phase of contrast, some of which are new from prior imaging and suspicious for metastatic disease. 3. Circumferential anorectal wall thickening, also seen on prior exam, recommend direct  visualization. 4. Small layering right pleural effusion. Right lower lobe airspace disease is greater than typically seen with compressive atelectasis. This may be secondary to pneumonia, however aspiration is considered given there is layering debris in the right mainstem bronchus. No obvious pulmonary mass. 5. Choledocholithiasis with 7 mm stone in the distal common bile duct. Mild biliary ductal dilatation. 6. Moderate hiatal hernia with dilated esophagus and intraluminal debris to the level of the thoracic inlet. 7. Colonic diverticulosis without diverticulitis. 8. 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 9. Displaced left femoral neck fracture, presumably pathologic. Possible pathologic fracture of the right superior puboacetabular junction, possible pathologic fracture of the sternum. 10. Generalized body wall edema. Aortic Atherosclerosis (ICD10-I70.0) and Emphysema (ICD10-J43.9). Electronically Signed   By: Keith Rake M.D.   On: 03/12/2022 22:10   DG Chest Portable 1 View  Result Date: 03/12/2022 CLINICAL DATA:  Shortness of breath. EXAM: PORTABLE CHEST 1 VIEW COMPARISON:  Chest radiograph dated 12/07/2021. FINDINGS: Small right pleural effusion and left lung base atelectasis or infiltrate. Probable left lung base atelectasis/scarring. No pneumothorax. The cardiac silhouette is within limits. No acute osseous pathology. Osteopenia. Left shoulder arthroplasty. IMPRESSION: Probable small right pleural effusion and right lung base atelectasis or infiltrate. Electronically Signed    By: Anner Crete M.D.   On: 03/12/2022 21:14        Scheduled Meds:  [MAR Hold] atorvastatin  10 mg Oral Daily   [MAR Hold] Chlorhexidine Gluconate Cloth  6 each Topical Daily   [MAR Hold] cholecalciferol  5,000 Units Oral Daily   [MAR Hold] feeding supplement  237 mL Oral BID BM   [MAR Hold] gabapentin  300 mg Oral TID   guaiFENesin  1,200 mg Oral BID   [MAR Hold] losartan  50 mg Oral Daily   [MAR Hold] metoprolol succinate  50 mg Oral Daily   [MAR Hold] multivitamin with minerals  1 tablet Oral Daily   [MAR Hold] pantoprazole  40 mg Oral Daily   [MAR Hold] sertraline  50 mg Oral QHS   [MAR Hold] sodium chloride flush  10-40 mL Intracatheter Q12H   [MAR Hold] Upadacitinib ER  15 mg Oral Daily   Continuous Infusions:  sodium chloride 0 mL/hr at 03/13/22 1341   [MAR Hold] piperacillin-tazobactam (ZOSYN)  IV 3.375 g (03/14/22 0553)     LOS: 2 days    Time spent: 40 minutes    Irine Seal, MD [facility]   To contact the attending provider between 7A-7P or the covering provider during after hours 7P-7A, please log into the web site [email or web address] and access using universal [place] password for that web site. If you do not have the password, please call the hospital operator.  03/14/2022, 11:41 AM

## 2022-03-14 NOTE — Anesthesia Procedure Notes (Signed)
Procedure Name: Intubation Date/Time: 03/14/2022 10:50 AM  Performed by: Lowry Bowl, CRNAPre-anesthesia Checklist: Patient identified, Emergency Drugs available, Suction available and Patient being monitored Patient Re-evaluated:Patient Re-evaluated prior to induction Oxygen Delivery Method: Circle system utilized Preoxygenation: Pre-oxygenation with 100% oxygen Induction Type: IV induction Ventilation: Mask ventilation without difficulty Laryngoscope Size: McGraph and 3 Grade View: Grade I Tube type: Oral Number of attempts: 1 Airway Equipment and Method: Stylet and Oral airway Placement Confirmation: ETT inserted through vocal cords under direct vision, positive ETCO2 and breath sounds checked- equal and bilateral Secured at: 19 cm Tube secured with: Tape Dental Injury: Teeth and Oropharynx as per pre-operative assessment  Comments: Placed by Orland Mustard SRNA

## 2022-03-14 NOTE — Op Note (Signed)
03/14/2022  1:55 PM  PATIENT:  Kathleen Blankenship   MRN: [id number]  PRE-OPERATIVE DIAGNOSIS: Pathologic left femoral neck fracture  POST-OPERATIVE DIAGNOSIS:  Pathologic left femoral neck fracture  PROCEDURE:  Procedure(s): Cemented left hip hemiarthoplasty  PREOPERATIVE INDICATIONS:    Kathleen Blankenship is an 71 y.o. female who was admitted with a diagnosis of Left femoral neck fracture.  I have recommended surgical fixation with hemiarthroplasty for this injury. I have explained the surgery and the postoperative course to the patient and their sister who have agreed with surgical management of this fracture.    The risks benefits and alternatives were discussed with the patient and her sister including but not limited to the risks of  infection requiring removal of the prosthesis, bleeding requiring blood transfusion, nerve injury especially to the sciatic nerve leading to foot drop or lower extremity numbness, periprosthetic fracture, dislocation leg length discrepancy, change in lower extremity rotation persistent hip pain, loosening or failure of the components and the need for revision surgery. Medical risks include but are not limited to DVT and pulmonary embolism, myocardial infarction, stroke, pneumonia, respiratory failure and death.  Patient had an episode of PEA during a cholecystectomy in April.  Patient has been cleared by cardiology for her left hip hemiarthroplasty surgery.  OPERATIVE REPORT     SURGEON:  Thornton Park, MD    ASSISTANT: Danae Orleans, PA    ANESTHESIA: General    COMPLICATIONS:  None.   SPECIMEN: Femoral head to pathology, femoral neck bone from proximal femoral osteotomy sent to pathology, bursal tissue sent to pathology,     COMPONENTS:  Stryker Accolade C size 3 cemented femoral component size 3 with a Unitrax 44 mm +12 neck adjustment sleeve.    PROCEDURE IN DETAIL:   The patient was met in the preop holding area and  identified.  The left hip was  identified and marked at the operative site after verbally confirming with the patient that this was the correct site of surgery.  The patient was then transported to the OR and underwent general anesthesia.  The patient was then placed in the lateral decubitus position with the operative side up and secured on the operating room table with a pegboard and all bony prominences were adequately padded. This included an axillary roll and additional padding around the nonoperative leg to prevent compression to the common peroneal nerve.    The operative lower extremity was prepped and draped in a sterile fashion.  A time out was performed prior to incision to verify patient's name, date of birth, medical record number, correct site of surgery correct procedure to be performed. The timeout was also used to verify the patient received antibiotics now appropriate instruments, implants and radiographic studies were available in the room. Once all in attendance were in agreement case began.    A posterolateral approach was utilized via sharp dissection  carried down to the subcutaneous tissue.  Bleeding vessels were coagulated using electrocautery.  The fascia lata was identified and incised along the length of the skin incision.  The gluteus maximus muscle was then split in line with its fibers. Self-retaining retractors were  inserted.  Cloudy fluid with apparent crystalline material was encountered in the area of the bursa.  This was swabbed and sent for culture and Gram stain.  Tissue samples of the bursa were also sent to pathology.  With the hip internally rotated, the short external rotators  were identified and removed from the posterior attachment from  the greater trochanter. The piriformis was tagged for later repair. The capsule was identified and a T-shaped capsulotomy was performed.  Again cloudy fluid with crystalline material was encountered and again swabbed and sent to the lab for Gram stain and culture.   Tissue samples of the hip joint including synovium were taken and sent to pathology for identification.  The capsule was tagged with #2 Tycron for later repair.  The femoral neck fracture was exposed.  An oscillating saw was used to perform a proximal femoral osteotomy 1 fingerbreadth above the lesser trochanter to remove the fractured femoral neck.  The femoral neck bone was sent to pathology for identification.  The neck appeared to have dense intramedullary material within it. The femoral head was removed using a corkscrew device and sent to pathology for analysis. The femoral head was measured to be 44 mm in diameter. The trial 44 mm femoral head was placed into the acetabulum and had an excellent suction fit. The attention was then turned back to femoral preparation.    A femoral skid and Cobra retractor were placed under the femoral neck to allow for adequate visualization. A box osteotome was used to make the initial entry into the proximal femur.  This bone sample was sent to pathology along with the femoral neck osteotomy bone sample. A single hand reamer was used to prepare the femoral canal.  Again cloudy fluid was encountered from the femoral canal with crystalline material within it.  This fluid again was collected and sent to the lab for analysis.  A T-shaped femoral canal sounder was then used to ensure no penetration femoral cortex had occurred during reaming. The proximal femur was then sequentially broached by hand. A size 3 Accolade C femoral trial broach was found to have best medial to lateral canal fit. Once adequate mediolateral canal fill was achieved the trial femoral broach, neck, and head was assembled and the hip was reduced. It was found to have excellent stability, equivalent leg lengths with functional range of motion with a +12 neck. The trial components were then removed.  I copiously irrigated the femoral canal.  A cement restrictor was placed 12 cm down the femoral canal.   Methylmethacrylate, medium viscosity, was then mixed on the back table.  This was then injected into the femoral canal using a cement gun.  The cement was then pressurized in the canal with a pressurized attachment onto the cement gun.  The size 3 Stryker Accolade C stem with an 11 mm centralizer at the tip was then inserted with approximately 15 degrees of anteversion.  Excess methylmethacrylate was removed.  The stem was held carefully into position while the methylmethacrylate hardened.  The 44 mm femoral head trial with a +12 neck adjustment sleeve was again trialed and found to have good stability and equivalent leg lengths.  I impacted the actual 44 mm Unitrax femoral head component with a +12 neck adjustment sleeve into place. The hip was then reduced and taken through functional range of motion and found to have excellent stability. Leg lengths were restored. The hip joint was copiously irrigated.   A soft tissue repair of the capsule and external rotators was performed using #2 Tycron. Excellent posterior capsular repair was achieved. The fascia lata was then closed with interrupted 0 Vicryl suture. The subcutaneous tissues were closed with 2-0 Vicryl and the skin approximated with staples.   The patient was then placed supine on the operative table. Leg lengths were checked clinically and found  to be equivalent. An abduction pillow was placed between the lower extremities. The patient was then transferred to a hospital bed and brought to the PACU in stable condition. I was scrubbed and present the entire case and all sharp and instrument counts were correct at the conclusion of the case. I spoke with the patient's family in the postop consultation room to let them know the case was completed without complication patient was stable in recovery room.     Timoteo Gaul, MD Orthopedic Surgeon

## 2022-03-14 NOTE — Progress Notes (Signed)
Consent form signed by patient and witness by this RN. CHG wipes complete. Dentures placed into denture container. Gown placed on patient. CMP sent to lab. Patients sister at bedside. PICC line flushed.

## 2022-03-14 NOTE — Consult Note (Signed)
Referring Physician:  No referring provider defined for this encounter.  Primary Physician:  Tracie Harrier, MD  History of Present Illness: 03/14/2022 Kathleen Blankenship is here today with a chief complaint of back pain.  She was admitted with worsening back pain and was found to have widespread metastatic disease.  She has no new neurologic symptoms.  She does still have pain when she stands.  She is also had malaise and fatigue.  She also reports significant left leg pain.  During work-up, she has been found to have widespread metastatic disease.   Review of Systems:  A 10 point review of systems is negative, except for the pertinent positives and negatives detailed in the HPI.  Past Medical History: Past Medical History:  Diagnosis Date   Anginal pain ([place])    Anxiety    Arthritis    RA   Cancer ([place])    Complication of anesthesia 12/06/2021   PEA Cardiac Arrest with ROSC after 1 round of chest compressions and 1 mg epinephrine. Infiltrated IV found after rescusitative attempts were made.   Coronary artery disease    GERD (gastroesophageal reflux disease)    Hypertension    Pneumonia     Past Surgical History: Past Surgical History:  Procedure Laterality Date   CARDIAC CATHETERIZATION     CORONARY/GRAFT ACUTE MI REVASCULARIZATION N/A 05/28/2017   Procedure: Coronary/Graft Acute MI Revascularization;  Surgeon: Isaias Cowman, MD;  Location: [place] [facility];  Service: Cardiovascular;  Laterality: N/A;   ERCP N/A 11/22/2021   Procedure: ENDOSCOPIC RETROGRADE CHOLANGIOPANCREATOGRAPHY (ERCP);  Surgeon: Lucilla Lame, MD;  Location: [facility];  Service: Endoscopy;  Laterality: N/A;   LEFT HEART CATH AND CORONARY ANGIOGRAPHY N/A 05/28/2017   Procedure: LEFT HEART CATH AND CORONARY ANGIOGRAPHY;  Surgeon: Isaias Cowman, MD;  Location: [place] [facility];  Service: Cardiovascular;  Laterality: N/A;   ORIF ELBOW FRACTURE Right 07/26/2020   Procedure:  OPEN REDUCTION INTERNAL FIXATION (ORIF) ELBOW/OLECRANON FRACTURE;  Surgeon: Shona Needles, MD;  Location: [place];  Service: Orthopedics;  Laterality: Right;   REVERSE SHOULDER ARTHROPLASTY Left 07/29/2020   Procedure: REVERSE SHOULDER ARTHROPLASTY;  Surgeon: Hiram Gash, MD;  Location: [place];  Service: Orthopedics;  Laterality: Left;   THYROID LOBECTOMY      Allergies: Allergies as of 03/12/2022 - Review Complete 03/12/2022  Allergen Reaction Noted   Codeine Hives 05/28/2017   Methotrexate Nausea Only 04/11/2021   Remicade [infliximab] Other (See Comments) 07/26/2020    Medications: Current Meds  Medication Sig   alendronate (FOSAMAX) 70 MG tablet Take 70 mg by mouth once a week. Sunday   atorvastatin (LIPITOR) 10 MG tablet Take 10 mg by mouth daily.   Cholecalciferol (VITAMIN D) 125 MCG (5000 UT) CAPS Take 5,000 Units by mouth daily.   gabapentin (NEURONTIN) 300 MG capsule Take 300 mg by mouth 3 (three) times daily.   leflunomide (ARAVA) 10 MG tablet Take 10 mg by mouth daily.   losartan (COZAAR) 50 MG tablet Take 50 mg by mouth daily.   metoprolol succinate (TOPROL-XL) 50 MG 24 hr tablet Take 50 mg by mouth daily.   pantoprazole (PROTONIX) 40 MG tablet Take 40 mg by mouth daily.   sertraline (ZOLOFT) 50 MG tablet Take 50 mg by mouth at bedtime.   Upadacitinib ER (RINVOQ) 15 MG TB24 Take 15 mg by mouth daily.    Social History: Social History   Tobacco Use   Smoking status: Every Day    Packs/day: 1.00  Years: 37.00    Total pack years: 37.00    Types: Cigarettes   Smokeless tobacco: Never  Substance Use Topics   Alcohol use: No   Drug use: No    Family Medical History: Family History  Family history unknown: Yes    Physical Examination: Vitals:   03/14/22 0123 03/14/22 0500  BP: 96/79 100/80  Pulse: 71 75  Resp: 20 20  Temp: 98.1 F (36.7 C) 98 F (36.7 C)  SpO2: 95% 97%    General: Patient is well developed, well nourished, calm, collected, and in  no apparent distress. Attention to examination is appropriate.  She is on oxygen via nasal cannula.  She is breathing without difficulty, but has some congestion.  Neck:   Supple.  Full range of motion.    NEUROLOGICAL:     Awake, alert, oriented to person, place, and time.  Speech is clear and fluent. Fund of knowledge is appropriate.   Cranial Nerves: Pupils equal round and reactive to light.  Facial tone is symmetric.  Facial sensation is symmetric. Shoulder shrug is symmetric. Tongue protrusion is midline.  There is no pronator drift.  ROM of spine: full.    Strength: Side Biceps Triceps Deltoid Interossei Grip Wrist Ext. Wrist Flex.  R '5 5 5 5 5 5 5  '$ L '5 5 5 5 5 5 5   '$ Side Iliopsoas Quads Hamstring PF DF EHL  R '5 5 5 5 5 5  '$ L - - - '5 5 5   '$ Reflexes are 2+ and symmetric at the biceps, triceps, brachioradialis, patella and achilles.   Hoffman's is absent.  Clonus is not present.  Toes are down-going.  Bilateral upper and lower extremity sensation is intact to light touch.    No evidence of dysmetria noted.  Gait is untested due to pathologic hip fracture.    Medical Decision Making  Imaging: MRI TL spine 03/13/22 IMPRESSION: 1. Findings compatible with extensive osseous metastatic disease throughout the visualized cervical, thoracic, and lumbar spine, as well as the sacrum. 2. Pathologic fractures of T4 and T10, new since CT on December 06 2021. Similar height loss of an L5 compression fracture. 3. Poor definition of bone marrow cortex at T3 and T4 where there is complete marrow replacement, suggesting possible extraosseous extension of tumor although evaluation is limited without contrast. Postcontrast imaging could further characterize if clinically warranted. 4. Multilevel degenerative change, greatest in the lumbar spine with similar moderate to severe canal stenosis at L3-L4 and multilevel foraminal stenosis, detailed above. 5. Partially imaged numerous hepatic  lesions, suspicious for metastatic disease given above findings. Also, layering right pleural effusion and suspected dilated fluid-filled esophagus. Recommend dedicated chest/abdominal/pelvic imaging to further evaluate disease burden and better evaluate for suspected primary malignancy.   These results will be called to the ordering clinician or representative by the Radiologist Assistant, and communication documented in the PACS or [employer].     Electronically Signed   By: Margaretha Sheffield M.D.   On: 03/12/2022 13:57  I have personally reviewed the images and agree with the above interpretation.  Assessment and Plan: Ms. Horkey is a pleasant 71 y.o. female with diffuse metastatic disease to the thoracic and lumbar spine.  She has significant disease outside of the spine as well.  She does not have a oncologic diagnosis at this point.  She may benefit from cement augmentation of her compression fractures.  I have sent a message to Dr. Denna Haggard for him to review.  From my standpoint, I have recommended TLSO bracing for now.  I do not think that surgical intervention with fixation is required for this case.  She will ultimately need medical and radiation oncology for treatment of her spine disease.  She will follow-up as needed.     Thank you for involving me in the care of this patient.      Urbano Milhouse K. Izora Ribas MD, [facility] Neurosurgery

## 2022-03-14 NOTE — Anesthesia Preprocedure Evaluation (Addendum)
Anesthesia Evaluation  Patient identified by MRN, date of birth, ID band Patient awake    Reviewed: Allergy & Precautions, NPO status , Patient's Chart, lab work & pertinent test results  History of Anesthesia Complications (+) history of anesthetic complications  Airway Mallampati: IV  TM Distance: >3 FB Neck ROM: full    Dental  (+) Upper Dentures, Lower Dentures, Missing   Pulmonary COPD, Current Smoker,    Pulmonary exam normal        Cardiovascular hypertension, (-) angina+ CAD   Rate:Tachycardia     Neuro/Psych PSYCHIATRIC DISORDERS Anxiety negative neurological ROS     GI/Hepatic Neg liver ROS, GERD  ,  Endo/Other  negative endocrine ROS  Renal/GU      Musculoskeletal   Abdominal   Peds  Hematology negative hematology ROS (+)   Anesthesia Other Findings Patient had a HR of 120 on the floor per the pre op nurse. She was given metoprolol immediately prior to bringing her down to the pre op area. Patient had a HR of 45 on the monitor in the pre op area. 12 lead EKG was done which showed sinus tachycardia in the 100s that was normal to her baseline EKG.   Past Medical History: No date: Anginal pain ([place]) No date: Anxiety No date: Arthritis     Comment:  RA No date: Cancer ([place]) 32/20/2542: Complication of anesthesia     Comment:  PEA Cardiac Arrest with ROSC after 1 round of chest               compressions and 1 mg epinephrine. Infiltrated IV found               after rescusitative attempts were made. No date: Coronary artery disease No date: GERD (gastroesophageal reflux disease) No date: Hypertension No date: Pneumonia  Past Surgical History: No date: CARDIAC CATHETERIZATION 05/28/2017: CORONARY/GRAFT ACUTE MI REVASCULARIZATION; N/A     Comment:  Procedure: Coronary/Graft Acute MI Revascularization;                Surgeon: Isaias Cowman, MD;  Location: [place] [facility];   Service: Cardiovascular;  Laterality:               N/A; 11/22/2021: ERCP; N/A     Comment:  Procedure: ENDOSCOPIC RETROGRADE               CHOLANGIOPANCREATOGRAPHY (ERCP);  Surgeon: Lucilla Lame,               MD;  Location: [facility] ENDOSCOPY;  Service: Endoscopy;                Laterality: N/A; 05/28/2017: LEFT HEART CATH AND CORONARY ANGIOGRAPHY; N/A     Comment:  Procedure: LEFT HEART CATH AND CORONARY ANGIOGRAPHY;                Surgeon: Isaias Cowman, MD;  Location: [place] [facility];  Service: Cardiovascular;  Laterality:               N/A; 07/26/2020: ORIF ELBOW FRACTURE; Right     Comment:  Procedure: OPEN REDUCTION INTERNAL FIXATION (ORIF)               ELBOW/OLECRANON FRACTURE;  Surgeon: Shona Needles, MD;  Location: [place];  Service: Orthopedics;  Laterality:               Right; 07/29/2020: REVERSE SHOULDER ARTHROPLASTY; Left     Comment:  Procedure: REVERSE SHOULDER ARTHROPLASTY;  Surgeon:               Hiram Gash, MD;  Location: [place];  Service:               Orthopedics;  Laterality: Left; No date: THYROID LOBECTOMY  BMI    Body Mass Index: 21.24 kg/m      Reproductive/Obstetrics negative OB ROS                            Anesthesia Physical Anesthesia Plan  ASA: 3  Anesthesia Plan: General/Spinal   Post-op Pain Management:    Induction: Intravenous  PONV Risk Score and Plan: 3 and Ondansetron, Dexamethasone, Midazolam and Treatment may vary due to age or medical condition  Airway Management Planned: Oral ETT  Additional Equipment:   Intra-op Plan:   Post-operative Plan: Extubation in OR  Informed Consent: I have reviewed the patients History and Physical, chart, labs and discussed the procedure including the risks, benefits and alternatives for the proposed anesthesia with the patient or authorized representative who has indicated his/her understanding and acceptance.     Dental  Advisory Given  Plan Discussed with: Anesthesiologist, CRNA and Surgeon  Anesthesia Plan Comments: (Patient consented for risks of anesthesia including but not limited to:  - adverse reactions to medications - damage to eyes, teeth, lips or other oral mucosa - nerve damage due to positioning  - sore throat or hoarseness - Damage to heart, brain, nerves, lungs, other parts of body or loss of life  Patient voiced understanding.)        Anesthesia Quick Evaluation

## 2022-03-14 NOTE — Assessment & Plan Note (Addendum)
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  Pathology from left femur fracture repair pending. -Breast tumor markers (CA 27-29, CEA 15-3) ordered per oncology noted to be elevated.  Palliative care consulted.  Patient has established as a DNR.  For now, primary focus is on pain control. -pt to hospice, does not desire aggressive treatment for cancer

## 2022-03-14 NOTE — Assessment & Plan Note (Addendum)
-   Urinalysis concerning for UTI with moderate leukocytes, positive nitrite, many bacteria. -Urine cultures with multiple species. -Currently on IV Zosyn.

## 2022-03-15 ENCOUNTER — Encounter: Payer: Self-pay | Admitting: Orthopedic Surgery

## 2022-03-15 DIAGNOSIS — S22070A Wedge compression fracture of T9-T10 vertebra, initial encounter for closed fracture: Secondary | ICD-10-CM

## 2022-03-15 DIAGNOSIS — F419 Anxiety disorder, unspecified: Secondary | ICD-10-CM | POA: Diagnosis not present

## 2022-03-15 DIAGNOSIS — K8050 Calculus of bile duct without cholangitis or cholecystitis without obstruction: Secondary | ICD-10-CM | POA: Diagnosis not present

## 2022-03-15 DIAGNOSIS — E785 Hyperlipidemia, unspecified: Secondary | ICD-10-CM | POA: Diagnosis not present

## 2022-03-15 DIAGNOSIS — S22000A Wedge compression fracture of unspecified thoracic vertebra, initial encounter for closed fracture: Secondary | ICD-10-CM | POA: Diagnosis present

## 2022-03-15 DIAGNOSIS — S22040A Wedge compression fracture of fourth thoracic vertebra, initial encounter for closed fracture: Secondary | ICD-10-CM

## 2022-03-15 DIAGNOSIS — M8450XA Pathological fracture in neoplastic disease, unspecified site, initial encounter for fracture: Secondary | ICD-10-CM | POA: Diagnosis not present

## 2022-03-15 LAB — CBC WITH DIFFERENTIAL/PLATELET
Abs Immature Granulocytes: 0.06 10*3/uL (ref 0.00–0.07)
Basophils Absolute: 0 10*3/uL (ref 0.0–0.1)
Basophils Relative: 0 %
Eosinophils Absolute: 0 10*3/uL (ref 0.0–0.5)
Eosinophils Relative: 0 %
HCT: 22.2 % — ABNORMAL LOW (ref 36.0–46.0)
Hemoglobin: 7.3 g/dL — ABNORMAL LOW (ref 12.0–15.0)
Immature Granulocytes: 1 %
Lymphocytes Relative: 4 %
Lymphs Abs: 0.3 10*3/uL — ABNORMAL LOW (ref 0.7–4.0)
MCH: 29.9 pg (ref 26.0–34.0)
MCHC: 32.9 g/dL (ref 30.0–36.0)
MCV: 91 fL (ref 80.0–100.0)
Monocytes Absolute: 1.3 10*3/uL — ABNORMAL HIGH (ref 0.1–1.0)
Monocytes Relative: 16 %
Neutro Abs: 6.5 10*3/uL (ref 1.7–7.7)
Neutrophils Relative %: 79 %
Platelets: 289 10*3/uL (ref 150–400)
RBC: 2.44 MIL/uL — ABNORMAL LOW (ref 3.87–5.11)
RDW: 19.1 % — ABNORMAL HIGH (ref 11.5–15.5)
WBC: 8.3 10*3/uL (ref 4.0–10.5)
nRBC: 0 % (ref 0.0–0.2)

## 2022-03-15 LAB — TROPONIN I (HIGH SENSITIVITY)
Troponin I (High Sensitivity): 19 ng/L — ABNORMAL HIGH (ref <18)
Troponin I (High Sensitivity): 23 ng/L — ABNORMAL HIGH (ref <18)

## 2022-03-15 LAB — COMPREHENSIVE METABOLIC PANEL
ALT: 16 U/L (ref 0–44)
AST: 38 U/L (ref 15–41)
Albumin: 1.9 g/dL — ABNORMAL LOW (ref 3.5–5.0)
Alkaline Phosphatase: 119 U/L (ref 38–126)
Anion gap: 3 — ABNORMAL LOW (ref 5–15)
BUN: 11 mg/dL (ref 8–23)
CO2: 23 mmol/L (ref 22–32)
Calcium: 6.6 mg/dL — ABNORMAL LOW (ref 8.9–10.3)
Chloride: 105 mmol/L (ref 98–111)
Creatinine, Ser: 0.52 mg/dL (ref 0.44–1.00)
GFR, Estimated: >60 mL/min (ref >60)
Glucose, Bld: 146 mg/dL — ABNORMAL HIGH (ref 70–99)
Potassium: 5.4 mmol/L — ABNORMAL HIGH (ref 3.5–5.1)
Sodium: 131 mmol/L — ABNORMAL LOW (ref 135–145)
Total Bilirubin: 0.7 mg/dL (ref 0.3–1.2)
Total Protein: 4.1 g/dL — ABNORMAL LOW (ref 6.5–8.1)

## 2022-03-15 LAB — TSH: TSH: 4.283 u[IU]/mL (ref 0.350–4.500)

## 2022-03-15 LAB — PROTIME-INR
INR: 1 (ref 0.8–1.2)
Prothrombin Time: 13.4 seconds (ref 11.4–15.2)

## 2022-03-15 LAB — PREPARE RBC (CROSSMATCH)

## 2022-03-15 LAB — APTT: aPTT: 37 seconds — ABNORMAL HIGH (ref 24–36)

## 2022-03-15 LAB — HEMOGLOBIN AND HEMATOCRIT, BLOOD
HCT: 36.1 % (ref 36.0–46.0)
Hemoglobin: 12.2 g/dL (ref 12.0–15.0)

## 2022-03-15 MED ORDER — SODIUM CHLORIDE 0.9% IV SOLUTION: Freq: Once | INTRAVENOUS | Status: AC

## 2022-03-15 MED ORDER — ACETAMINOPHEN 325 MG PO TABS: 650.0000 mg | ORAL_TABLET | Freq: Once | ORAL | Status: DC

## 2022-03-15 MED ORDER — TRAMADOL HCL 50 MG PO TABS
50.0000 mg | ORAL_TABLET | Freq: Four times a day (QID) | ORAL | Status: DC | PRN
  Administered 2022-03-19: 50 mg via ORAL

## 2022-03-15 MED ORDER — DIPHENHYDRAMINE HCL 25 MG PO CAPS
25.0000 mg | ORAL_CAPSULE | Freq: Once | ORAL | Status: AC
Start: 2022-03-15 — End: 2022-03-15
  Administered 2022-03-15: 25 mg via ORAL
  Filled 2022-03-15: qty 1

## 2022-03-15 MED ORDER — DILTIAZEM HCL 25 MG/5ML IV SOLN
10.0000 mg | Freq: Once | INTRAVENOUS | Status: AC
  Administered 2022-03-15: 10 mg via INTRAVENOUS
  Filled 2022-03-15: qty 5

## 2022-03-15 MED ORDER — HEPARIN (PORCINE) 25000 UT/250ML-% IV SOLN
700.0000 [IU]/h | INTRAVENOUS | Status: DC
  Administered 2022-03-15: 750 [IU]/h via INTRAVENOUS
  Administered 2022-03-17: 700 [IU]/h via INTRAVENOUS
  Filled 2022-03-15 (×2): qty 250

## 2022-03-15 MED ORDER — DILTIAZEM HCL-DEXTROSE 125-5 MG/125ML-% IV SOLN (PREMIX)
5.0000 mg/h | INTRAVENOUS | Status: DC
  Administered 2022-03-15: 5 mg/h via INTRAVENOUS
  Administered 2022-03-16: 10 mg/h via INTRAVENOUS
  Filled 2022-03-15 (×2): qty 125

## 2022-03-15 MED ORDER — DILTIAZEM LOAD VIA INFUSION
10.0000 mg | Freq: Once | INTRAVENOUS | Status: DC
Start: 2022-03-15 — End: 2022-03-15

## 2022-03-15 MED ORDER — GABAPENTIN 300 MG PO CAPS
300.0000 mg | ORAL_CAPSULE | Freq: Every day | ORAL | Status: DC
  Administered 2022-03-16 – 2022-03-18 (×3): 300 mg via ORAL
  Filled 2022-03-15 (×3): qty 1

## 2022-03-15 MED ORDER — FUROSEMIDE 10 MG/ML IJ SOLN
20.0000 mg | Freq: Once | INTRAMUSCULAR | Status: AC
  Administered 2022-03-15: 20 mg via INTRAVENOUS
  Filled 2022-03-15: qty 2

## 2022-03-15 NOTE — Progress Notes (Signed)
   03/15/22 1200  Clinical Encounter Type  Visited With Patient and family together  Visit Type Initial  Referral From Nurse  Consult/Referral To Chaplain   Chaplain responded to nurse consult. Chaplain provided compassionate presence and reflective listening as patient and sister spoke about health journey. Chaplain provided AD paperwork. Chaplain visit was interrupted when patient became ill. Patient will have nurse page Chaplain if a return visit is requested.

## 2022-03-15 NOTE — TOC Initial Note (Signed)
Transition of Care [facility]) - Initial/Assessment Note    Patient Details  Name: Kathleen Blankenship MRN: [id number] Date of Birth: 1951-08-09  Transition of Care [facility]) CM/SW Contact:    Candie Chroman, LCSW Phone Number: 03/15/2022, 3:29 PM  Clinical Narrative:  CSW met with patient. Sister at bedside. Had short visit due to OT coming in for eval. Patient confirmed she's active with [place] home health for PT. [place] representative said they can add whatever is needed at discharge. OT is delayed about 3 weeks but most MD's have added an aide to hold over until then. No DME recommendations so far. No further concerns. CSW encouraged patient to contact CSW as needed. CSW will continue to follow patient for support and facilitate return home when stable.                Expected Discharge Plan: [place] Barriers to Discharge: Continued Medical Work up   Patient Goals and CMS Choice     Choice offered to / list presented to : NA  Expected Discharge Plan and Services Expected Discharge Plan: [place] Acute Care Choice: Resumption of Svcs/PTA Provider Living arrangements for the past 2 months: Mobile Home                                      Prior Living Arrangements/Services Living arrangements for the past 2 months: Mobile Home Lives with:: Self Patient language and need for interpreter reviewed:: Yes Do you feel safe going back to the place where you live?: Yes      Need for Family Participation in Patient Care: Yes (Comment)   Current home services: DME, Home PT Criminal Activity/Legal Involvement Pertinent to Current Situation/Hospitalization: No - Comment as needed  Activities of Daily Living Home Assistive Devices/Equipment: Bedside commode/3-in-1, Wheelchair ADL Screening (condition at time of admission) Patient's cognitive ability adequate to safely complete daily activities?: Yes Is the patient deaf or have  difficulty hearing?: No Does the patient have difficulty seeing, even when wearing glasses/contacts?: No Does the patient have difficulty concentrating, remembering, or making decisions?: No Patient able to express need for assistance with ADLs?: Yes Does the patient have difficulty dressing or bathing?: Yes Independently performs ADLs?: No Does the patient have difficulty walking or climbing stairs?: Yes Weakness of Legs: Both Weakness of Arms/Hands: None  Permission Sought/Granted Permission sought to share information with : Facility Art therapist granted to share information with : Yes, Verbal Permission Granted  Share Information with NAME: Quintin Alto  Permission granted to share info w AGENCY: [place] granted to share info w Relationship: Sister  Permission granted to share info w Contact Information: [phone]  Emotional Assessment Appearance:: Appears stated age Attitude/Demeanor/Rapport: Engaged, Gracious Affect (typically observed): Accepting, Appropriate, Calm, Pleasant Orientation: : Oriented to Self, Oriented to Place, Oriented to  Time, Oriented to Situation Alcohol / Substance Use: Not Applicable Psych Involvement: No (comment)  Admission diagnosis:  Metastatic cancer to bone [facility]) [C79.51] Pathological fracture of neck of left femur, initial encounter [facility]) [C16.384T] 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 [M84.50XA] Patient Active Problem List   Diagnosis Date Noted   UTI (urinary tract infection) 03/14/2022   Dyslipidemia 03/13/2022   Essential hypertension 03/13/2022   Anxiety and depression 03/13/2022   GERD (gastroesophageal reflux disease) 03/13/2022   Pleural effusion on  right 03/13/2022   Pressure injury of skin 03/13/2022   PNA (pneumonia) 03/13/2022   Protein-calorie malnutrition, severe 03/13/2022   Anemia 03/13/2022   Metastatic cancer to bone [facility])    Pathological fracture due to  metastatic bone disease 03/12/2022   Cardiac arrest ([place]) 12/06/2021   Choledocholithiasis    Closed fracture dislocation of right elbow joint, initial encounter 07/26/2020   CAD (coronary artery disease) 07/26/2020   Rheumatoid arthritis ([place]) 07/26/2020   Closed fracture dislocation of right elbow 07/26/2020   Closed fracture of left proximal humerus 07/26/2020   Chest pain on exertion 05/28/2017   Chest pain 05/28/2017   PCP:  Tracie Harrier, MD Pharmacy:   [id number] [place], [email or web address]: [phone]: [phone]    Social Determinants of Health (SDOH) Interventions    Readmission Risk Interventions    07/31/2020    1:24 PM  Readmission Risk Prevention Plan  Post Dischage Appt Complete  Medication Screening Complete  Transportation Screening Complete

## 2022-03-15 NOTE — Progress Notes (Signed)
Subjective:  POD #1 s/p left hip hemiarthroplasty.   Patient reports left hip pain as mild to moderate.  Pain is primarily when she moves, but she has been able to flex her left knee now without significant pain which had been painful over the past 2-3 weeks.  Patient's sister is at the bedside.  Occupational Therapy is in the room working with the patient.  Objective:   VITALS:   Vitals:   03/15/22 1123 03/15/22 1145 03/15/22 1415 03/15/22 1501  BP: 106/87 139/65 115/72 121/64  Pulse: 99 (!) 102 88 96  Resp: '18 18 18 18  '$ Temp: 98.4 F (36.9 C) 97.9 F (36.6 C) 98 F (36.7 C) 97.7 F (36.5 C)  TempSrc:  Oral Axillary Oral  SpO2: 90%     Weight:      Height:        PHYSICAL EXAM: Left lower extremity Neurovascular intact Sensation intact distally Intact pulses distally Dorsiflexion/Plantar flexion intact Incision: scant drainage No cellulitis present Compartment soft  LABS  Results for orders placed or performed during the hospital encounter of 03/12/22 (from the past 24 hour(s))  Comprehensive metabolic panel     Status: Abnormal   Collection Time: 03/15/22  4:45 AM  Result Value Ref Range   Sodium 131 (L) 135 - 145 mmol/L   Potassium 5.4 (H) 3.5 - 5.1 mmol/L   Chloride 105 98 - 111 mmol/L   CO2 23 22 - 32 mmol/L   Glucose, Bld 146 (H) 70 - 99 mg/dL   BUN 11 8 - 23 mg/dL   Creatinine, Ser 0.52 0.44 - 1.00 mg/dL   Calcium 6.6 (L) 8.9 - 10.3 mg/dL   Total Protein 4.1 (L) 6.5 - 8.1 g/dL   Albumin 1.9 (L) 3.5 - 5.0 g/dL   AST 38 15 - 41 U/L   ALT 16 0 - 44 U/L   Alkaline Phosphatase 119 38 - 126 U/L   Total Bilirubin 0.7 0.3 - 1.2 mg/dL   GFR, Estimated >60 >60 mL/min   Anion gap 3 (L) 5 - 15  CBC with Differential/Platelet     Status: Abnormal   Collection Time: 03/15/22  4:45 AM  Result Value Ref Range   WBC 8.3 4.0 - 10.5 K/uL   RBC 2.44 (L) 3.87 - 5.11 MIL/uL   Hemoglobin 7.3 (L) 12.0 - 15.0 g/dL   HCT 22.2 (L) 36.0 - 46.0 %   MCV 91.0 80.0 - 100.0 fL    MCH 29.9 26.0 - 34.0 pg   MCHC 32.9 30.0 - 36.0 g/dL   RDW 19.1 (H) 11.5 - 15.5 %   Platelets 289 150 - 400 K/uL   nRBC 0.0 0.0 - 0.2 %   Neutrophils Relative % 79 %   Neutro Abs 6.5 1.7 - 7.7 K/uL   Lymphocytes Relative 4 %   Lymphs Abs 0.3 (L) 0.7 - 4.0 K/uL   Monocytes Relative 16 %   Monocytes Absolute 1.3 (H) 0.1 - 1.0 K/uL   Eosinophils Relative 0 %   Eosinophils Absolute 0.0 0.0 - 0.5 K/uL   Basophils Relative 0 %   Basophils Absolute 0.0 0.0 - 0.1 K/uL   Immature Granulocytes 1 %   Abs Immature Granulocytes 0.06 0.00 - 0.07 K/uL  Prepare RBC (crossmatch)     Status: None   Collection Time: 03/15/22  9:10 AM  Result Value Ref Range   Order Confirmation      ORDER PROCESSED BY BLOOD BANK Performed at [facility], [address] [place], [place] [address]     DG Hip Port Unilat With Pelvis 1V Left  Result Date: 03/14/2022 CLINICAL DATA:  Status post left hip replacement EXAM: DG HIP (WITH OR WITHOUT PELVIS) 1V PORT LEFT COMPARISON:  None Available. FINDINGS: Interval postsurgical changes from left total hip arthroplasty for left femoral neck fracture. Arthroplasty components appear in their expected alignment. Numerous scattered lytic lesions compatible with known diffuse osseous metastatic disease. Expected postoperative changes within the overlying soft tissues. IMPRESSION: Postsurgical changes from left total hip arthroplasty. Electronically Signed   By: Yetta Glassman M.D.   On: 03/14/2022 14:54    Assessment/Plan: 1 Day Post-Op   Principal Problem:   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 Active Problems:   Rheumatoid arthritis ([place])   Choledocholithiasis   Dyslipidemia   Essential hypertension   Anxiety and depression   GERD (gastroesophageal reflux disease)   Pleural effusion on right   Pressure injury of skin   PNA (pneumonia)   Protein-calorie malnutrition, severe   Metastatic cancer to bone ([facility])   Anemia   UTI (urinary  tract infection)  Awaiting surgical pathology.  Patient receiving second unit of PRBCs for a hemoglobin of 7.3 this morning.  Continue physical and occupational therapy as tolerated.  Recheck labs in the morning.    [place] , MD 03/15/2022, 3:16 PM

## 2022-03-15 NOTE — Progress Notes (Signed)
   03/15/22 1806  Assess: MEWS Score  BP (!) 135/106  Pulse Rate (!) 122  Resp 18  Assess: MEWS Score  MEWS Temp 0  MEWS Systolic 0  MEWS Pulse 2  MEWS RR 0  MEWS LOC 0  MEWS Score 2  MEWS Score Color Yellow  Assess: if the MEWS score is Yellow or Red  Were vital signs taken at a resting state? Yes  Focused Assessment Change from prior assessment (see assessment flowsheet)  Does the patient meet 2 or more of the SIRS criteria? No  MEWS guidelines implemented *See Row Information* Yes  Treat  MEWS Interventions Escalated (See documentation below)  Escalate  MEWS: Escalate Yellow: discuss with charge nurse/RN and consider discussing with provider and RRT  Notify: Charge Nurse/RN  Name of Charge Nurse/RN Notified JO RN  Date Charge Nurse/RN Notified 03/15/22  Time Charge Nurse/RN Notified [place]  Notify: Provider  Provider Name/Title Irine Seal  Date Provider Notified 03/15/22  Time Provider Notified 1715  Method of Notification  (secure chat)  Notification Reason Change in status  Provider response See new orders  Date of Provider Response 03/15/22  Time of Provider Response 1720  Assess: SIRS CRITERIA  SIRS Temperature  0  SIRS Pulse 1  SIRS Respirations  0  SIRS WBC 1  SIRS Score Sum  2   ICU RN Sarah to bedside. Assisted with new orders. MD [place] to bedside and met with patient and spoke with patient sister, Kieth Brightly.

## 2022-03-15 NOTE — Consult Note (Addendum)
   Palliative Medicine [facility] [phone] [phone]   Name: Kathleen Blankenship Date: 03/15/2022 MRN: [id number]  DOB: 04/14/1951  Patient Care Team: Hande, Vishwanath, MD as PCP - General (Internal Medicine)    REASON FOR CONSULTATION: Kathleen Blankenship is a 71 y.o. female with multiple medical problems including CAD, hypertension, GERD, history of cholecystectomy in April 2023 with subsequent postop PEA arrest.  Patient was admitted to hospital 03/12/2022 with worsening back pain with thoracic and lumbar MRI, followed by CTs of the chest/abdomen/pelvis showing extensive osseous metastatic disease.  Patient was found to have pathologic fractures of T4, T10, and L5.  She was noted to have numerous hepatic metastases.  She was also found to have a pathologic displaced left femoral neck fracture and underwent hemiarthroplasty.  Palliative care was consulted to address goals and manage ongoing symptoms.  SOCIAL HISTORY:     reports that she has been smoking cigarettes. She has a 37.00 pack-year smoking history. She has never used smokeless tobacco. She reports that she does not drink alcohol and does not use drugs.  Patient has been married and divorced several times.  She has a son who lives nearby but her son's wife is currently undergoing a CABG.  Patient has a sister and brother who are involved.  Patient previously worked in retail  ADVANCE DIRECTIVES:  Does not have  CODE STATUS: DNR  PAST MEDICAL HISTORY: Past Medical History:  Diagnosis Date   Anginal pain (HCC)    Anxiety    Arthritis    RA   Cancer (HCC)    Complication of anesthesia 12/06/2021   PEA Cardiac Arrest with ROSC after 1 round of chest compressions and 1 mg epinephrine. Infiltrated IV found after rescusitative attempts were made.   Coronary artery disease    GERD (gastroesophageal reflux disease)    Hypertension    Pneumonia     PAST SURGICAL HISTORY:   Past Surgical History:  Procedure Laterality Date   CARDIAC CATHETERIZATION     CORONARY/GRAFT ACUTE MI REVASCULARIZATION N/A 05/28/2017   Procedure: Coronary/Graft Acute MI Revascularization;  Surgeon: Paraschos, Alexander, MD;  Location: [facility] INVASIVE CV LAB;  Service: Cardiovascular;  Laterality: N/A;   ERCP N/A 11/22/2021   Procedure: ENDOSCOPIC RETROGRADE CHOLANGIOPANCREATOGRAPHY (ERCP);  Surgeon: Wohl, Darren, MD;  Location: [facility] ENDOSCOPY;  Service: Endoscopy;  Laterality: N/A;   LEFT HEART CATH AND CORONARY ANGIOGRAPHY N/A 05/28/2017   Procedure: LEFT HEART CATH AND CORONARY ANGIOGRAPHY;  Surgeon: Paraschos, Alexander, MD;  Location: [facility] INVASIVE CV LAB;  Service: Cardiovascular;  Laterality: N/A;   ORIF ELBOW FRACTURE Right 07/26/2020   Procedure: OPEN REDUCTION INTERNAL FIXATION (ORIF) ELBOW/OLECRANON FRACTURE;  Surgeon: Haddix, Kevin P, MD;  Location: [facility] OR;  Service: Orthopedics;  Laterality: Right;   REVERSE SHOULDER ARTHROPLASTY Left 07/29/2020   Procedure: REVERSE SHOULDER ARTHROPLASTY;  Surgeon: Varkey, Dax T, MD;  Location: [facility] OR;  Service: Orthopedics;  Laterality: Left;   THYROID LOBECTOMY      HEMATOLOGY/ONCOLOGY HISTORY:  Oncology History   No history exists.    ALLERGIES:  is allergic to codeine, methotrexate, and remicade [infliximab].  MEDICATIONS:  Current Facility-Administered Medications  Medication Dose Route Frequency Provider Last Rate Last Admin   0.9 %  sodium chloride infusion (Manually program via Guardrails IV Fluids)   Intravenous Once Thompson, Daniel V, MD       0.9 %  sodium chloride infusion   Intravenous Continuous Krasinski, Kevin, MD 0 mL/hr at   03/13/22 1341 New Bag at 03/14/22 1037   acetaminophen (TYLENOL) tablet 650 mg  650 mg Oral Q6H PRN Krasinski, Kevin, MD   650 mg at 03/13/22 0347   Or   acetaminophen (TYLENOL) suppository 650 mg  650 mg Rectal Q6H PRN Krasinski, Kevin, MD       acetaminophen (TYLENOL) tablet 1,000 mg  1,000 mg Oral  Q6H Krasinski, Kevin, MD   1,000 mg at 03/15/22 0528   albuterol (PROVENTIL) (2.5 MG/3ML) 0.083% nebulizer solution 3 mL  3 mL Nebulization Q6H PRN Krasinski, Kevin, MD       alum & mag hydroxide-simeth (MAALOX/MYLANTA) 200-200-20 MG/5ML suspension 30 mL  30 mL Oral Q4H PRN Krasinski, Kevin, MD       atorvastatin (LIPITOR) tablet 10 mg  10 mg Oral Daily Krasinski, Kevin, MD   10 mg at 03/15/22 0821   bisacodyl (DULCOLAX) suppository 10 mg  10 mg Rectal Daily PRN Krasinski, Kevin, MD       Chlorhexidine Gluconate Cloth 2 % PADS 6 each  6 each Topical Daily Krasinski, Kevin, MD   6 each at 03/15/22 0821   cholecalciferol (VITAMIN D3) tablet 5,000 Units  5,000 Units Oral Daily Krasinski, Kevin, MD   5,000 Units at 03/15/22 0820   cyclobenzaprine (FLEXERIL) tablet 5-10 mg  5-10 mg Oral QHS PRN Krasinski, Kevin, MD   10 mg at 03/13/22 0014   diphenhydrAMINE (BENADRYL) capsule 25 mg  25 mg Oral Once Thompson, Daniel V, MD       docusate sodium (COLACE) capsule 100 mg  100 mg Oral BID Krasinski, Kevin, MD   100 mg at 03/15/22 0820   enoxaparin (LOVENOX) injection 40 mg  40 mg Subcutaneous Q24H Krasinski, Kevin, MD   40 mg at 03/15/22 0821   feeding supplement (ENSURE ENLIVE / ENSURE PLUS) liquid 237 mL  237 mL Oral BID BM Krasinski, Kevin, MD   237 mL at 03/15/22 0821   furosemide (LASIX) injection 20 mg  20 mg Intravenous Once Thompson, Daniel V, MD       [START ON 03/16/2022] gabapentin (NEURONTIN) capsule 300 mg  300 mg Oral QHS ,  R, NP       guaiFENesin (MUCINEX) 12 hr tablet 1,200 mg  1,200 mg Oral BID Krasinski, Kevin, MD   1,200 mg at 03/15/22 0821   ketorolac (TORADOL) 15 MG/ML injection 15 mg  15 mg Intravenous Q6H PRN Krasinski, Kevin, MD       LORazepam (ATIVAN) tablet 0.5 mg  0.5 mg Oral BID PRN Krasinski, Kevin, MD       magnesium hydroxide (MILK OF MAGNESIA) suspension 30 mL  30 mL Oral Daily PRN Krasinski, Kevin, MD       menthol-cetylpyridinium (CEPACOL) lozenge 3 mg  1  lozenge Oral PRN Krasinski, Kevin, MD       Or   phenol (CHLORASEPTIC) mouth spray 1 spray  1 spray Mouth/Throat PRN Krasinski, Kevin, MD       metoprolol succinate (TOPROL-XL) 24 hr tablet 50 mg  50 mg Oral Daily Krasinski, Kevin, MD   50 mg at 03/15/22 0821   multivitamin with minerals tablet 1 tablet  1 tablet Oral Daily Krasinski, Kevin, MD   1 tablet at 03/15/22 0820   naphazoline-pheniramine (NAPHCON-A) 0.025-0.3 % ophthalmic solution 1 drop  1 drop Both Eyes QID PRN Thompson, Daniel V, MD   1 drop at 03/14/22 2027   ondansetron (ZOFRAN) tablet 4 mg  4 mg Oral Q6H PRN Krasinski, Kevin, MD         Or   ondansetron (ZOFRAN) injection 4 mg  4 mg Intravenous Q6H PRN Krasinski, Kevin, MD       oxyCODONE (Oxy IR/ROXICODONE) immediate release tablet 5-10 mg  5-10 mg Oral Q4H PRN ,  R, NP       pantoprazole (PROTONIX) EC tablet 40 mg  40 mg Oral Daily Krasinski, Kevin, MD   40 mg at 03/15/22 0821   piperacillin-tazobactam (ZOSYN) IVPB 3.375 g  3.375 g Intravenous Q8H Krasinski, Kevin, MD 12.5 mL/hr at 03/15/22 0532 3.375 g at 03/15/22 0532   polyethylene glycol (MIRALAX / GLYCOLAX) packet 17 g  17 g Oral Daily PRN Krasinski, Kevin, MD       senna (SENOKOT) tablet 8.6 mg  1 tablet Oral BID Krasinski, Kevin, MD   8.6 mg at 03/15/22 0820   sertraline (ZOLOFT) tablet 50 mg  50 mg Oral QHS Krasinski, Kevin, MD   50 mg at 03/14/22 2021   sodium chloride flush (NS) 0.9 % injection 10-40 mL  10-40 mL Intracatheter Q12H Krasinski, Kevin, MD   10 mL at 03/15/22 0822   sodium chloride flush (NS) 0.9 % injection 10-40 mL  10-40 mL Intracatheter PRN Krasinski, Kevin, MD       traMADol (ULTRAM) tablet 50 mg  50 mg Oral Q6H ,  R, NP   50 mg at 03/15/22 0529   traMADol (ULTRAM) tablet 50 mg  50 mg Oral Q6H PRN ,  R, NP       traZODone (DESYREL) tablet 25 mg  25 mg Oral QHS PRN Krasinski, Kevin, MD   25 mg at 03/13/22 2104    VITAL SIGNS: BP 108/75 (BP Location: Left Arm)    Pulse 98   Temp 98.6 F (37 C)   Resp 18   Ht 5' 1" (1.549 m)   Wt 112 lb 7 oz (51 kg)   SpO2 98%   BMI 21.24 kg/m  Filed Weights   03/12/22 2027 03/14/22 0957  Weight: 112 lb 7 oz (51 kg) 112 lb 7 oz (51 kg)    Estimated body mass index is 21.24 kg/m as calculated from the following:   Height as of this encounter: 5' 1" (1.549 m).   Weight as of this encounter: 112 lb 7 oz (51 kg).  LABS: CBC:    Component Value Date/Time   WBC 8.3 03/15/2022 0445   HGB 7.3 (L) 03/15/2022 0445   HGB 13.8 09/10/2012 0825   HCT 22.2 (L) 03/15/2022 0445   HCT 40.9 09/10/2012 0825   PLT 289 03/15/2022 0445   PLT 198 09/10/2012 0825   MCV 91.0 03/15/2022 0445   MCV 80 09/10/2012 0825   NEUTROABS 6.5 03/15/2022 0445   LYMPHSABS 0.3 (L) 03/15/2022 0445   MONOABS 1.3 (H) 03/15/2022 0445   EOSABS 0.0 03/15/2022 0445   BASOSABS 0.0 03/15/2022 0445   Comprehensive Metabolic Panel:    Component Value Date/Time   NA 131 (L) 03/15/2022 0445   NA 137 09/10/2012 0825   K 5.4 (H) 03/15/2022 0445   K 3.7 09/10/2012 0825   CL 105 03/15/2022 0445   CL 104 09/10/2012 0825   CO2 23 03/15/2022 0445   CO2 25 09/10/2012 0825   BUN 11 03/15/2022 0445   BUN 6 (L) 09/10/2012 0825   CREATININE 0.52 03/15/2022 0445   CREATININE 0.64 09/10/2012 0825   GLUCOSE 146 (H) 03/15/2022 0445   GLUCOSE 107 (H) 09/10/2012 0825   CALCIUM 6.6 (L) 03/15/2022 0445   CALCIUM 8.8 09/10/2012 0825     AST 38 03/15/2022 0445   ALT 16 03/15/2022 0445   ALKPHOS 119 03/15/2022 0445   BILITOT 0.7 03/15/2022 0445   PROT 4.1 (L) 03/15/2022 0445   ALBUMIN 1.9 (L) 03/15/2022 0445    RADIOGRAPHIC STUDIES: DG Hip Port Unilat With Pelvis 1V Left  Result Date: 03/14/2022 CLINICAL DATA:  Status post left hip replacement EXAM: DG HIP (WITH OR WITHOUT PELVIS) 1V PORT LEFT COMPARISON:  None Available. FINDINGS: Interval postsurgical changes from left total hip arthroplasty for left femoral neck fracture. Arthroplasty components  appear in their expected alignment. Numerous scattered lytic lesions compatible with known diffuse osseous metastatic disease. Expected postoperative changes within the overlying soft tissues. IMPRESSION: Postsurgical changes from left total hip arthroplasty. Electronically Signed   By: Leah  Strickland M.D.   On: 03/14/2022 14:54   DG Knee Complete 4 Views Right  Result Date: 03/13/2022 CLINICAL DATA:  Bilateral knee pain EXAM: RIGHT KNEE - COMPLETE 4+ VIEW COMPARISON:  None Available. FINDINGS: Alignment is anatomic. No joint effusion. No acute fracture. No significant joint space narrowing. Vascular calcifications. IMPRESSION: No significant osseous abnormality. Electronically Signed   By: Praneil  Patel M.D.   On: 03/13/2022 13:05   DG Knee Complete 4 Views Left  Result Date: 03/13/2022 CLINICAL DATA:  Left knee injury. EXAM: LEFT KNEE - COMPLETE 4+ VIEW COMPARISON:  None Available. FINDINGS: No evidence of fracture, dislocation, or joint effusion. No evidence of arthropathy or other focal bone abnormality. Soft tissue edema superior to the left knee. IMPRESSION: 1. No acute fracture or dislocation identified about the left knee. 2. Soft tissue edema superior to the left knee. Electronically Signed   By: Dobrinka  Dimitrova M.D.   On: 03/13/2022 13:04   ECHOCARDIOGRAM COMPLETE  Result Date: 03/13/2022    ECHOCARDIOGRAM REPORT   Patient Name:   Kathleen Blankenship Date of Exam: 03/13/2022 Medical Rec #:  [id number]      Height:       61.0 in Accession #:    [id number]     Weight:       112.4 lb Date of Birth:  03/04/1951      BSA:          1.479 m Patient Age:    70 years       BP:           126/79 mmHg Patient Gender: F              HR:           100 bpm. Exam Location:  [facility] Procedure: 2D Echo, Color Doppler and Cardiac Doppler Indications:     CAD native vessel I25.10  History:         Patient has prior history of Echocardiogram examinations, most                  recent 12/06/2021. CAD; Risk  Factors:Hypertension and Current                  Smoker. Pneumonia.  Sonographer:     Jerry Hege Referring Phys:  [id number] LILY MICHELLE TANG Diagnosing Phys: Dwayne D Callwood MD  Sonographer Comments: Suboptimal apical window and suboptimal subcostal window. Image acquisition challenging due to COPD. IMPRESSIONS  1. Left ventricular ejection fraction, by estimation, is 65 to 70%. The left ventricle has normal function. The left ventricle has no regional wall motion abnormalities. There is mild left ventricular hypertrophy. Left ventricular diastolic parameters are consistent with Grade I diastolic dysfunction (impaired   relaxation).  2. Right ventricular systolic function is normal. The right ventricular size is normal.  3. The mitral valve is normal in structure. Trivial mitral valve regurgitation.  4. The aortic valve is normal in structure. Aortic valve regurgitation is not visualized. FINDINGS  Left Ventricle: Left ventricular ejection fraction, by estimation, is 65 to 70%. The left ventricle has normal function. The left ventricle has no regional wall motion abnormalities. The left ventricular internal cavity size was normal in size. There is  mild left ventricular hypertrophy. Left ventricular diastolic parameters are consistent with Grade I diastolic dysfunction (impaired relaxation). Right Ventricle: The right ventricular size is normal. No increase in right ventricular wall thickness. Right ventricular systolic function is normal. Left Atrium: Left atrial size was normal in size. Right Atrium: Right atrial size was normal in size. Pericardium: There is no evidence of pericardial effusion. Mitral Valve: The mitral valve is normal in structure. Trivial mitral valve regurgitation. Tricuspid Valve: The tricuspid valve is normal in structure. Tricuspid valve regurgitation is trivial. Aortic Valve: The aortic valve is normal in structure. Aortic valve regurgitation is not visualized. Aortic valve mean gradient  measures 4.5 mmHg. Aortic valve peak gradient measures 8.2 mmHg. Aortic valve area, by VTI measures 3.48 cm. Pulmonic Valve: The pulmonic valve was normal in structure. Pulmonic valve regurgitation is not visualized. Aorta: The ascending aorta was not well visualized. IAS/Shunts: No atrial level shunt detected by color flow Doppler.  LEFT VENTRICLE PLAX 2D LVIDd:         3.30 cm   Diastology LVIDs:         2.10 cm   LV e' medial:    4.68 cm/s LV PW:         1.20 cm   LV E/e' medial:  19.7 LV IVS:        1.20 cm   LV e' lateral:   13.30 cm/s LVOT diam:     2.00 cm   LV E/e' lateral: 6.9 LV SV:         70 LV SV Index:   47 LVOT Area:     3.14 cm  RIGHT VENTRICLE RV Basal diam:  3.80 cm RV S prime:     13.60 cm/s TAPSE (M-mode): 1.6 cm LEFT ATRIUM             Index        RIGHT ATRIUM           Index LA diam:        2.10 cm 1.42 cm/m   RA Area:     13.80 cm LA Vol (A2C):   15.3 ml 10.35 ml/m  RA Volume:   35.30 ml  23.87 ml/m LA Vol (A4C):   22.2 ml 15.01 ml/m LA Biplane Vol: 19.6 ml 13.26 ml/m  AORTIC VALVE AV Area (Vmax):    2.65 cm AV Area (Vmean):   2.73 cm AV Area (VTI):     3.48 cm AV Vmax:           143.50 cm/s AV Vmean:          94.750 cm/s AV VTI:            0.202 m AV Peak Grad:      8.2 mmHg AV Mean Grad:      4.5 mmHg LVOT Vmax:         121.00 cm/s LVOT Vmean:        82.200 cm/s LVOT VTI:            0.223 m LVOT/AV VTI ratio: 1.11  AORTA Ao Root diam: 3.57 cm MITRAL VALVE                TRICUSPID VALVE MV Area (PHT): 3.63 cm     TR Peak grad:   40.4 mmHg MV Decel Time: 209 msec     TR Vmax:        318.00 cm/s MV E velocity: 92.10 cm/s MV A velocity: 102.00 cm/s  SHUNTS MV E/A ratio:  0.90         Systemic VTI:  0.22 m                             Systemic Diam: 2.00 cm Dwayne D Callwood MD Electronically signed by Dwayne D Callwood MD Signature Date/Time: 03/13/2022/12:56:43 PM    Final    US EKG SITE RITE  Result Date: 03/13/2022 If Site Rite image not attached, placement could not be confirmed  due to current cardiac rhythm.  DG HIP UNILAT WITH PELVIS 2-3 VIEWS LEFT  Result Date: 03/13/2022 CLINICAL DATA:  Left hip fracture. EXAM: DG HIP (WITH OR WITHOUT PELVIS) 2-3V LEFT COMPARISON:  CT yesterday. FINDINGS: Displaced left femoral neck fracture with likely underlying lytic lesion. Femoral head remains seated. There is proximal migration of the femoral shaft. The question pathologic fracture at the right puboacetabular junction is tentatively visualized. Many of the additional bone lesions on CT are not well seen by radiograph. There is excreted IV contrast in the urinary bladder which appears trabeculated. IMPRESSION: Displaced left femoral neck fracture with likely underlying lytic lesion. Possible pathologic fracture at the right puboacetabular junction on CT is tentatively visualized. Many of the additional bone lesions are not well seen by radiograph. Electronically Signed   By: Melanie  Sanford M.D.   On: 03/13/2022 01:03   CT CHEST ABDOMEN PELVIS W CONTRAST  Result Date: 03/12/2022 CLINICAL DATA:  Metastatic disease evaluation. Osseous metastatic disease of the thoracic and lumbar spine. EXAM: CT CHEST, ABDOMEN, AND PELVIS WITH CONTRAST TECHNIQUE: Multidetector CT imaging of the chest, abdomen and pelvis was performed following the standard protocol during bolus administration of intravenous contrast. RADIATION DOSE REDUCTION: This exam was performed according to the departmental dose-optimization program which includes automated exposure control, adjustment of the mA and/or kV according to patient size and/or use of iterative reconstruction technique. CONTRAST:  75mL OMNIPAQUE IOHEXOL 300 MG/ML  SOLN COMPARISON:  Thoracic and lumbar spine MRI yesterday. Chest abdomen pelvis CT 12/06/2021 FINDINGS: CT CHEST FINDINGS Cardiovascular: Aortic atherosclerosis. Aortic tortuosity. The heart is normal in size. There are coronary artery calcifications. No pericardial effusion. Mediastinum/Nodes:  Moderate-sized hiatal hernia. The esophagus is dilated and contains intraluminal debris to the level of the thoracic inlet. 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 The right thyroid gland may be surgically absent. No definite mediastinal or hilar adenopathy. Right axillary node measures 9 mm series 2, image 18. Lungs/Pleura: Small layering right pleural effusion. No convincing pleural nodularity or enhancement. Right lower lobe airspace disease is greater than typically seen with compressive atelectasis. Mild emphysema. Tiny 3 mm left lower lobe nodule series 4, image 84. This was previously obscured. Previous calcified right lower lobe nodule is again seen, surrounded by adjacent atelectatic lung. Central bronchial thickening. There is layering debris/mucus within the distal trachea and right mainstem bronchus. Mild dependent atelectasis in the left lower lobe. Musculoskeletal: Multiple healing anterior rib fractures typically seen with CPR.   Many of these demonstrate delayed union. Sternal body fracture may be pathologic with adjacent sclerosis. Known thoracic osseous metastatic disease was assessed on thoracic spine MRI yesterday. Pathologic T4 and T10 vertebral body compression fractures. Some of the lesions on MRI are visualized with lytic lesions, occasional areas of cortical sclerosis involving T3 and T4. There multiple small lucent rib lesions. The right subareolar breast tissue appears slightly lobulated, but is similar in appearance to prior exam. There is generalized body wall edema. Left shoulder arthroplasty. CT ABDOMEN PELVIS FINDINGS Hepatobiliary: Patient had numerous liver lesions on spine MRI. These lesions are difficult to visualize on the current exam, however there is a 2.8 cm lesion in the right hepatic dome. Multiple additional low-density lesions scattered throughout the liver, difficult to assess due to phase of contrast. Some of these lesions were  previously characterized as hemangioma on MRI 11/02/2021, however some of these lesions are definitely new. Prior cholecystectomy. Common bile duct measures 9 mm proximally, 10 mm distally. There is a 7 mm stone in the distal common bile duct, series 5, image 58. There is slight central intrahepatic biliary ductal dilatation. Pancreas: 7 mm low-density in the pancreatic head, also seen on prior MRI, characterized as cyst. No evidence of suspicious pancreatic lesion. There is slight proximal ductal prominence of 4 mm. No peripancreatic inflammation. Spleen: Normal in size without focal abnormality. Adrenals/Urinary Tract: No adrenal nodule. Mild left renal atrophy. Tiny hypodensities in the renal cortices are too small to characterize but likely small cysts. No solid renal lesion. No hydronephrosis. No bladder wall thickening. No dense of bladder lesion. Stomach/Bowel: Bowel assessment is limited in the absence of enteric contrast. There is a moderate hiatal hernia. Mild gastric wall hyperemia. No small bowel obstruction or inflammation. Normal appendix. Multifocal colonic diverticulosis. Stool distends the rectum. Circumferential anorectal thickening again seen, series 2, image 107. Vascular/Lymphatic: Aortic atherosclerosis. No aortic aneurysm. No definite abdominopelvic adenopathy. Reproductive: Atrophic uterus is tentatively visualized. No evidence of adnexal mass. Other: There is generalized edema of the subcutaneous and intra-abdominal fat. No convincing abdominopelvic ascites. No omental thickening. Musculoskeletal: There is a displaced left femoral neck fracture, presumably pathologic. Possible pathologic fracture of the right superior puboacetabular junction innumerable osseous metastasis throughout the pelvis, multiple areas of low-density as well as trabecular coarsening. Lumbar metastatic disease better assessed on lumbar spine yesterday remote L5 compression fracture. IMPRESSION: 1. Osseous metastatic  disease throughout the chest, abdomen, and pelvis, as described. 2. Multiple hepatic lesions which are poorly defined on the current exam due to phase of contrast, some of which are new from prior imaging and suspicious for metastatic disease. 3. Circumferential anorectal wall thickening, also seen on prior exam, recommend direct visualization. 4. Small layering right pleural effusion. Right lower lobe airspace disease is greater than typically seen with compressive atelectasis. This may be secondary to pneumonia, however aspiration is considered given there is layering debris in the right mainstem bronchus. No obvious pulmonary mass. 5. Choledocholithiasis with 7 mm stone in the distal common bile duct. Mild biliary ductal dilatation. 6. Moderate hiatal hernia with dilated esophagus and intraluminal debris to the level of the thoracic inlet. 7. Colonic diverticulosis without diverticulitis. 8. 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 9. Displaced left femoral neck fracture, presumably pathologic. Possible pathologic fracture of the right superior puboacetabular junction, possible pathologic fracture of the sternum. 10. Generalized body wall edema. Aortic Atherosclerosis (ICD10-I70.0) and Emphysema (ICD10-J43.9). Electronically Signed   By: Aurther Loft.D.  On: 03/12/2022 22:10   DG Chest Portable 1 View  Result Date: 03/12/2022 CLINICAL DATA:  Shortness of breath. EXAM: PORTABLE CHEST 1 VIEW COMPARISON:  Chest radiograph dated 12/07/2021. FINDINGS: Small right pleural effusion and left lung base atelectasis or infiltrate. Probable left lung base atelectasis/scarring. No pneumothorax. The cardiac silhouette is within limits. No acute osseous pathology. Osteopenia. Left shoulder arthroplasty. IMPRESSION: Probable small right pleural effusion and right lung base atelectasis or infiltrate. Electronically Signed   By: Arash  Radparvar M.D.   On: 03/12/2022 21:14   MR  THORACIC SPINE WO CONTRAST  Result Date: 03/12/2022 CLINICAL DATA:  Mid and lower back pain. EXAM: MRI THORACIC AND LUMBAR SPINE WITHOUT CONTRAST TECHNIQUE: Multiplanar and multiecho pulse sequences of the thoracic and lumbar spine were obtained without intravenous contrast. COMPARISON:  CT chest abdomen pelvis April 13, 23. MRI February 12, 23. FINDINGS: MRI THORACIC SPINE FINDINGS Alignment:  No substantial sagittal subluxation Vertebrae: Extensive/numerous T1 hypointense and STIR hyperintense lesions throughout the visualized lower cervical and thoracic spine, compatible with osseous metastatic disease. Lesions involve both the vertebral bodies as well as the posterior elements. Complete replacement of the bone marrow at T3 and T4. Height loss of the T4 and T10 vertebral bodies is compatible with pathologic fractures, new since CT on April 13, 23. Cord:  Normal cord signal. Paraspinal and other soft tissues: Layering right pleural effusion. Suspected dilated, fluid-filled esophagus. Disc levels: Poor definition of bone marrow cortex at T3 and T4 where there is complete marrow replacement, suggesting possible extraosseous extension of tumor although evaluation is limited without contrast. MRI LUMBAR SPINE FINDINGS Segmentation:  Standard. Alignment: Grade 1 retrolisthesis of L1 on L2 and L2 on L3 and grade 1 anterolisthesis of L5 on S1. Vertebrae: Numerous T1 hypointense and STIR hyperintense lesions throughout the visualized lumbar spine and sacrum, concerning for osseous metastatic disease. Remote L5 compression fracture with similar height loss. Conus medullaris and cauda equina: Conus extends to the T12 level. Conus appears normal. Paraspinal and other soft tissues: Limited intra-abdominal evaluation due to motion/artifact. Numerous hepatic lesions on the coronal T2 image. Disc levels: T12-L1: No significant disc protrusion, foraminal stenosis, or canal stenosis. L1-L2: Posterior disc osteophyte complex and  facet arthropathy. Moderate right foraminal stenosis. Right subarticular stenosis with otherwise patent canal. L2-L3: Disc height loss. Disc bulging and endplate spurring. Similar moderate right and mild left foraminal stenosis with out significant canal stenosis. L3-L4: Right eccentric disc bulging with bilateral facet arthropathy. Similar moderate to severe canal stenosis with severe bilateral subarticular recess stenosis. Similar mild-to-moderate right foraminal stenosis. L4-L5: Mild disc bulging bilateral facet arthropathy. Moderate left and mild right foraminal stenosis, similar. L5-S1: Facet arthropathy and mild disc bulging. Mild left foraminal stenosis, similar. IMPRESSION: 1. Findings compatible with extensive osseous metastatic disease throughout the visualized cervical, thoracic, and lumbar spine, as well as the sacrum. 2. Pathologic fractures of T4 and T10, new since CT on December 06 2021. Similar height loss of an L5 compression fracture. 3. Poor definition of bone marrow cortex at T3 and T4 where there is complete marrow replacement, suggesting possible extraosseous extension of tumor although evaluation is limited without contrast. Postcontrast imaging could further characterize if clinically warranted. 4. Multilevel degenerative change, greatest in the lumbar spine with similar moderate to severe canal stenosis at L3-L4 and multilevel foraminal stenosis, detailed above. 5. Partially imaged numerous hepatic lesions, suspicious for metastatic disease given above findings. Also, layering right pleural effusion and suspected dilated fluid-filled esophagus. Recommend dedicated chest/abdominal/pelvic imaging to   further evaluate disease burden and better evaluate for suspected primary malignancy. These results will be called to the ordering clinician or representative by the Radiologist Assistant, and communication documented in the PACS or [employer]. Electronically Signed   By: Margaretha Sheffield M.D.    On: 03/12/2022 13:57   MR LUMBAR SPINE WO CONTRAST  Result Date: 03/12/2022 CLINICAL DATA:  Mid and lower back pain. EXAM: MRI THORACIC AND LUMBAR SPINE WITHOUT CONTRAST TECHNIQUE: Multiplanar and multiecho pulse sequences of the thoracic and lumbar spine were obtained without intravenous contrast. COMPARISON:  CT chest abdomen pelvis April 13, 23. MRI February 12, 23. FINDINGS: MRI THORACIC SPINE FINDINGS Alignment:  No substantial sagittal subluxation Vertebrae: Extensive/numerous T1 hypointense and STIR hyperintense lesions throughout the visualized lower cervical and thoracic spine, compatible with osseous metastatic disease. Lesions involve both the vertebral bodies as well as the posterior elements. Complete replacement of the bone marrow at T3 and T4. Height loss of the T4 and T10 vertebral bodies is compatible with pathologic fractures, new since CT on April 13, 23. Cord:  Normal cord signal. Paraspinal and other soft tissues: Layering right pleural effusion. Suspected dilated, fluid-filled esophagus. Disc levels: Poor definition of bone marrow cortex at T3 and T4 where there is complete marrow replacement, suggesting possible extraosseous extension of tumor although evaluation is limited without contrast. MRI LUMBAR SPINE FINDINGS Segmentation:  Standard. Alignment: Grade 1 retrolisthesis of L1 on L2 and L2 on L3 and grade 1 anterolisthesis of L5 on S1. Vertebrae: Numerous T1 hypointense and STIR hyperintense lesions throughout the visualized lumbar spine and sacrum, concerning for osseous metastatic disease. Remote L5 compression fracture with similar height loss. Conus medullaris and cauda equina: Conus extends to the T12 level. Conus appears normal. Paraspinal and other soft tissues: Limited intra-abdominal evaluation due to motion/artifact. Numerous hepatic lesions on the coronal T2 image. Disc levels: T12-L1: No significant disc protrusion, foraminal stenosis, or canal stenosis. L1-L2: Posterior  disc osteophyte complex and facet arthropathy. Moderate right foraminal stenosis. Right subarticular stenosis with otherwise patent canal. L2-L3: Disc height loss. Disc bulging and endplate spurring. Similar moderate right and mild left foraminal stenosis with out significant canal stenosis. L3-L4: Right eccentric disc bulging with bilateral facet arthropathy. Similar moderate to severe canal stenosis with severe bilateral subarticular recess stenosis. Similar mild-to-moderate right foraminal stenosis. L4-L5: Mild disc bulging bilateral facet arthropathy. Moderate left and mild right foraminal stenosis, similar. L5-S1: Facet arthropathy and mild disc bulging. Mild left foraminal stenosis, similar. IMPRESSION: 1. Findings compatible with extensive osseous metastatic disease throughout the visualized cervical, thoracic, and lumbar spine, as well as the sacrum. 2. Pathologic fractures of T4 and T10, new since CT on December 06 2021. Similar height loss of an L5 compression fracture. 3. Poor definition of bone marrow cortex at T3 and T4 where there is complete marrow replacement, suggesting possible extraosseous extension of tumor although evaluation is limited without contrast. Postcontrast imaging could further characterize if clinically warranted. 4. Multilevel degenerative change, greatest in the lumbar spine with similar moderate to severe canal stenosis at L3-L4 and multilevel foraminal stenosis, detailed above. 5. Partially imaged numerous hepatic lesions, suspicious for metastatic disease given above findings. Also, layering right pleural effusion and suspected dilated fluid-filled esophagus. Recommend dedicated chest/abdominal/pelvic imaging to further evaluate disease burden and better evaluate for suspected primary malignancy. These results will be called to the ordering clinician or representative by the Radiologist Assistant, and communication documented in the PACS or [employer]. Electronically Signed    By: Albertina Parr  S Jones M.D.   On: 03/12/2022 13:57    PERFORMANCE STATUS (ECOG) : 2 - Symptomatic, <50% confined to bed  Review of Systems Unless otherwise noted, a complete review of systems is negative.  Physical Exam General: NAD Pulmonary: Unlabored  Extremities: no edema, no joint deformities Skin: no rashes Neurological: Weakness but otherwise nonfocal  IMPRESSION: Met with patient and sister.  Patient reports feeling like a whirlwind of information has been presented to her.  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  Patient says that she has talked with her sister and family and she does not think that she is interested in pursuing aggressive chemotherapy but would be interested in any cancer treatments that might improve symptoms or with a low risk of it negatively impacting quality of life.  Of note, patient says her father and several other family members have died of stage IV cancer.  Patient is status post hemiarthroplasty yesterday.  She seems to be doing well postop.  She worked with PT today and was up to the chair.  Patient's goals to return home at time of discharge.  Patient sister asked about hospice care but is somewhat premature given the lack of diagnosis and interested in possibility of exploring some treatment options.  Symptomatically, patient endorses back and hip pain.  Medications reviewed.  Patient has multiple opioids ordered including scheduled tramadol, gabapentin, Norco, oxycodone, and hydromorphone.  Patient says that what works the best is tramadol.  However, she has had some difficulty getting extra tramadol given scheduled dosing.  Patient also requests to discontinue daytime dosing of gabapentin as it makes her feel loopy.  Patient is interested in pursuing kyphoplasty.  We will consult IR.  At baseline, patient lives at home alone.  She has good family support and her sister is very involved.  Patient has  a son but his wife is currently having health issues and patient has not sure how involved he can be at this point.  Patient is a DNR.  She is interested in establishing advance directives.  We will consult chaplain.  PLAN: -Continue current scope of treatment -Scheduled tramadol and will also add as needed dosing -DC Norco/hydromorphone -May use oxycodone as backup if tramadol is ineffective -Daily bowel regimen -DC daytime dosing of gabapentin per patient request -IR for consideration of kyphoplasty -Chaplain to assist with ACP -We will plan follow-up in the clinic  Case and plan discussed with Dr. Rao   Patient expressed understanding and was in agreement with this plan. She also understands that She can call the clinic at any time with any questions, concerns, or complaints.     Time Total: 70 minutes  Visit consisted of counseling and education dealing with the complex and emotionally intense issues of symptom management and palliative care in the setting of serious and potentially life-threatening illness.Greater than 50%  of this time was spent counseling and coordinating care related to the above assessment and plan.  Signed by:  , PhD, NP-C   

## 2022-03-15 NOTE — Progress Notes (Signed)
PT Cancellation Note  Patient Details Name: Kathleen Blankenship MRN: [id number] DOB: [birth year]   Cancelled Treatment:    Reason Eval/Treat Not Completed: Other (comment). Pt eating breakfast, PT to re-attempt as able.   Lieutenant Diego PT, DPT 8:53 AM,03/15/22

## 2022-03-15 NOTE — Progress Notes (Signed)
[facility] for heparin infusion Indication: atrial fibrillation  Allergies  Allergen Reactions   Codeine Hives   Methotrexate Nausea Only   Remicade [Infliximab] Other (See Comments)    Chest felt heavy, was given benadryl.    Patient Measurements: Height: '5\' 1"'$  (154.9 cm) Weight: 51 kg (112 lb 7 oz) IBW/kg (Calculated) : 47.8 Heparin Dosing Weight: 51 kg  Vital Signs: Temp: 97.7 F (36.5 C) (07/21 1715) Temp Source: Oral (07/21 1715) BP: 135/106 (07/21 1806) Pulse Rate: 122 (07/21 1806)  Labs: Recent Labs    03/13/22 0149 03/14/22 0555 03/14/22 0915 03/15/22 0445 03/15/22 1800  HGB 8.5* 9.6*  --  7.3* 12.2  HCT 25.5* 28.7*  --  22.2* 36.1  PLT 384 396  --  289  --   CREATININE 0.74  --  0.58 0.52  --     Estimated Creatinine Clearance: 50.1 mL/min (by C-G formula based on SCr of 0.52 mg/dL).   Medical History: Past Medical History:  Diagnosis Date   Anginal pain ([place])    Anxiety    Arthritis    RA   Cancer ([place])    Complication of anesthesia 12/06/2021   [facility]A Cardiac Arrest with ROSC after 1 round of chest compressions and 1 mg epinephrine. Infiltrated IV found after rescusitative attempts were made.   Coronary artery disease    GERD (gastroesophageal reflux disease)    Hypertension    Pneumonia    Medications: Pt initially ordered Enoxaparin 40 mg and given 1 dose 7/21 @ 0821.  Assessment: Pt is a 71 yo female admitted 03/12/22 that developing A fib, confirmed by EKG, this evening.  Goal of Therapy:  Heparin level 0.3-0.7 units/ml Monitor platelets by anticoagulation protocol: Yes   Plan:  No boluses per MD, "closely watch Hgb" Start heparin infusion at 750 units/hr Will check HL in 6 hr after start of infusion CBC daily while on heparin  Renda Rolls, PharmD, [facility] 03/15/2022 6:33 PM

## 2022-03-15 NOTE — Progress Notes (Signed)
Hematology/Oncology Consult note [facility]  Telephone:([phone] [phone]  Patient Care Team: Tracie Harrier, MD as PCP - General (Internal Medicine)   Name of the patient: Kathleen Blankenship  [id number]  [birth year]   Date of visit: 03/17/2022  Interval history-denies any significant back pain at rest.  Family at bedside.  Still reports ongoing fatigue and shortness of breath.  Urine output has not been good in the last 24 hours.  ECOG PS- 3 Pain scale- 0   Review of systems- Review of Systems  Constitutional:  Negative for chills, fever, malaise/fatigue and weight loss.  HENT:  Negative for congestion, ear discharge and nosebleeds.   Eyes:  Negative for blurred vision.  Respiratory:  Negative for cough, hemoptysis, sputum production, shortness of breath and wheezing.   Cardiovascular:  Negative for chest pain, palpitations, orthopnea and claudication.  Gastrointestinal:  Negative for abdominal pain, blood in stool, constipation, diarrhea, heartburn, melena, nausea and vomiting.  Genitourinary:  Negative for dysuria, flank pain, frequency, hematuria and urgency.  Musculoskeletal:  Negative for back pain, joint pain and myalgias.  Skin:  Negative for rash.  Neurological:  Negative for dizziness, tingling, focal weakness, seizures, weakness and headaches.  Endo/Heme/Allergies:  Does not bruise/bleed easily.  Psychiatric/Behavioral:  Negative for depression and suicidal ideas. The patient does not have insomnia.       Allergies  Allergen Reactions   Codeine Hives   Methotrexate Nausea Only   Remicade [Infliximab] Other (See Comments)    Chest felt heavy, was given benadryl.     Past Medical History:  Diagnosis Date   Anginal pain ([place])    Anxiety    Arthritis    RA   Cancer ([place])    Complication of anesthesia 12/06/2021   PEA Cardiac Arrest with ROSC after 1 round of chest compressions and 1 mg epinephrine. Infiltrated IV found  after rescusitative attempts were made.   Coronary artery disease    GERD (gastroesophageal reflux disease)    Hypertension    Pneumonia      Past Surgical History:  Procedure Laterality Date   CARDIAC CATHETERIZATION     CORONARY/GRAFT ACUTE MI REVASCULARIZATION N/A 05/28/2017   Procedure: Coronary/Graft Acute MI Revascularization;  Surgeon: Isaias Cowman, MD;  Location: [facility];  Service: Cardiovascular;  Laterality: N/A;   ERCP N/A 11/22/2021   Procedure: ENDOSCOPIC RETROGRADE CHOLANGIOPANCREATOGRAPHY (ERCP);  Surgeon: Lucilla Lame, MD;  [facility] END[facility];  Service: Endoscopy;  Laterality: N/A;   HIP ARTHROPLASTY Left 03/14/2022   Procedure: ARTHROPLASTY BIPOLAR HIP (HEMIARTHROPLASTY);  Surgeon: Thornton Park, MD;  Location: [facility];  Service: Orthopedics;  Laterality: Left;   LEFT HEART CATH AND CORONARY ANGIOGRAPHY N/A 05/28/2017   Procedure: LEFT HEART CATH AND CORONARY ANGIOGRAPHY;  Surgeon: Isaias Cowman, MD;  Location: [place];  Service: Cardiovascular;  Laterality: N/A;   ORIF ELBOW FRACTURE Right 07/26/2020   Procedure: OPEN REDUCTION INTERNAL FIXATION (ORIF) ELBOW/OLECRANON FRACTURE;  Surgeon: Shona Needles, MD;  Location: [place];  Service: Orthopedics;  Laterality: Right;   REVERSE SHOULDER ARTHROPLASTY Left 07/29/2020   Procedure: REVERSE SHOULDER ARTHROPLASTY;  Surgeon: Hiram Gash, MD;  Location: [place];  Service: Orthopedics;  Laterality: Left;   THYROID LOBECTOMY      Social History   Socioeconomic History   Marital status: Divorced    Spouse name: Not on file   Number of children: Not on file   Years of education: Not on file   Highest  education level: Not on file  Occupational History   Not on file  Tobacco Use   Smoking status: Every Day    Packs/day: 1.00    Years: 37.00    Total pack years: 37.00    Types: Cigarettes   Smokeless tobacco: Never  Vaping Use   Vaping Use: Not on file  Substance and  Sexual Activity   Alcohol use: No   Drug use: No   Sexual activity: Never  Other Topics Concern   Not on file  Social History Narrative   Not on file   Social Determinants of Health   Financial Resource Strain: Not on file  Food Insecurity: Not on file  Transportation Needs: Not on file  Physical Activity: Not on file  Stress: Not on file  Social Connections: Not on file  Intimate Partner Violence: Not on file    Family History  Family history unknown: Yes     Current Facility-Administered Medications:    0.9 %  sodium chloride infusion, , Intravenous, Continuous, Thornton Park, MD, Last Rate: 0 mL/hr at 03/13/22 1341, New Bag at 03/14/22 1037   acetaminophen (TYLENOL) tablet 650 mg, 650 mg, Oral, Q6H PRN, 650 mg at 03/13/22 0347 **OR** acetaminophen (TYLENOL) suppository 650 mg, 650 mg, Rectal, Q6H PRN, Thornton Park, MD   acetaminophen (TYLENOL) tablet 1,000 mg, 1,000 mg, Oral, Q6H, Thornton Park, MD, 1,000 mg at 03/15/22 0528   albuterol (PROVENTIL) (2.5 MG/3ML) 0.083% nebulizer solution 3 mL, 3 mL, Nebulization, Q6H PRN, Thornton Park, MD   alum & mag hydroxide-simeth (MAALOX/MYLANTA) 200-200-20 MG/5ML suspension 30 mL, 30 mL, Oral, Q4H PRN, Thornton Park, MD   atorvastatin (LIPITOR) tablet 10 mg, 10 mg, Oral, Daily, Thornton Park, MD, 10 mg at 03/15/22 0998   bisacodyl (DULCOLAX) suppository 10 mg, 10 mg, Rectal, Daily PRN, Thornton Park, MD   Chlorhexidine Gluconate Cloth 2 % PADS 6 each, 6 each, Topical, Daily, Thornton Park, MD, 6 each at 03/15/22 3382   cholecalciferol (VITAMIN D3) tablet 5,000 Units, 5,000 Units, Oral, Daily, Thornton Park, MD, 5,000 Units at 03/15/22 0820   cyclobenzaprine (FLEXERIL) tablet 5-10 mg, 5-10 mg, Oral, QHS PRN, Thornton Park, MD, 10 mg at 03/13/22 0014   docusate sodium (COLACE) capsule 100 mg, 100 mg, Oral, BID, Thornton Park, MD, 100 mg at 03/15/22 0820   enoxaparin (LOVENOX) injection 40 mg, 40 mg,  Subcutaneous, Q24H, Thornton Park, MD, 40 mg at 03/15/22 5053   feeding supplement (ENSURE ENLIVE / ENSURE PLUS) liquid 237 mL, 237 mL, Oral, BID BM, Thornton Park, MD, 237 mL at 03/15/22 9767   furosemide (LASIX) injection 20 mg, 20 mg, Intravenous, Once, Eugenie Filler, MD   [START ON 03/16/2022] gabapentin (NEURONTIN) capsule 300 mg, 300 mg, Oral, QHS, Borders, Kirt Boys, NP   guaiFENesin (MUCINEX) 12 hr tablet 1,200 mg, 1,200 mg, Oral, BID, Thornton Park, MD, 1,200 mg at 03/15/22 3419   ketorolac (TORADOL) 15 MG/ML injection 15 mg, 15 mg, Intravenous, Q6H PRN, Thornton Park, MD   LORazepam (ATIVAN) tablet 0.5 mg, 0.5 mg, Oral, BID PRN, Thornton Park, MD   magnesium hydroxide (MILK OF MAGNESIA) suspension 30 mL, 30 mL, Oral, Daily PRN, Thornton Park, MD   menthol-cetylpyridinium (CEPACOL) lozenge 3 mg, 1 lozenge, Oral, PRN **OR** phenol (CHLORASEPTIC) mouth spray 1 spray, 1 spray, Mouth/Throat, PRN, Thornton Park, MD   metoprolol succinate (TOPROL-XL) 24 hr tablet 50 mg, 50 mg, Oral, Daily, Thornton Park, MD, 50 mg at 03/15/22 3790   multivitamin with minerals tablet 1  tablet, 1 tablet, Oral, Daily, Thornton Park, MD, 1 tablet at 03/15/22 0820   naphazoline-pheniramine (NAPHCON-A) 0.025-0.3 % ophthalmic solution 1 drop, 1 drop, Both Eyes, QID PRN, Eugenie Filler, MD, 1 drop at 03/14/22 2027   ondansetron (ZOFRAN) tablet 4 mg, 4 mg, Oral, Q6H PRN **OR** ondansetron (ZOFRAN) injection 4 mg, 4 mg, Intravenous, Q6H PRN, Thornton Park, MD, 4 mg at 03/15/22 1107   oxyCODONE (Oxy IR/ROXICODONE) immediate release tablet 5-10 mg, 5-10 mg, Oral, Q4H PRN, Borders, Kirt Boys, NP   pantoprazole (PROTONIX) EC tablet 40 mg, 40 mg, Oral, Daily, Thornton Park, MD, 40 mg at 03/15/22 0821   piperacillin-tazobactam (ZOSYN) IVPB 3.375 g, 3.375 g, Intravenous, Q8H, Thornton Park, MD, Last Rate: 12.5 mL/hr at 03/15/22 0532, 3.375 g at 03/15/22 0532   polyethylene glycol (MIRALAX  / GLYCOLAX) packet 17 g, 17 g, Oral, Daily PRN, Thornton Park, MD   senna (SENOKOT) tablet 8.6 mg, 1 tablet, Oral, BID, Thornton Park, MD, 8.6 mg at 03/15/22 0820   sertraline (ZOLOFT) tablet 50 mg, 50 mg, Oral, QHS, Thornton Park, MD, 50 mg at 03/14/22 2021   sodium chloride flush (NS) 0.9 % injection 10-40 mL, 10-40 mL, Intracatheter, Q12H, Thornton Park, MD, 10 mL at 03/15/22 9702   sodium chloride flush (NS) 0.9 % injection 10-40 mL, 10-40 mL, Intracatheter, PRN, Thornton Park, MD   traMADol Veatrice Bourbon) tablet 50 mg, 50 mg, Oral, Q6H, Borders, Joshua R, NP, 50 mg at 03/15/22 1107   traMADol (ULTRAM) tablet 50 mg, 50 mg, Oral, Q6H PRN, Borders, Kirt Boys, NP   traZODone (DESYREL) tablet 25 mg, 25 mg, Oral, QHS PRN, Thornton Park, MD, 25 mg at 03/13/22 2104  Physical exam:  Vitals:   03/15/22 0803 03/15/22 0825 03/15/22 1123 03/15/22 1145  BP: 108/75  106/87 139/65  Pulse: 98  99 (!) 102  Resp: 18  18 18   Temp: 98.6 F (37 C)  98.4 F (36.9 C) 97.9 F (36.6 C)  TempSrc:    Oral  SpO2: 99% 98% 90%   Weight:      Height:       Physical Exam Constitutional:      General: She is not in acute distress. Cardiovascular:     Rate and Rhythm: Normal rate and regular rhythm.     Heart sounds: Normal heart sounds.  Pulmonary:     Comments: Decreased over bases. On O2 Abdominal:     General: Bowel sounds are normal.     Palpations: Abdomen is soft.  Musculoskeletal:     Cervical back: Normal range of motion.  Skin:    General: Skin is warm and dry.  Neurological:     Mental Status: She is alert and oriented to person, place, and time.         Latest Ref Rng & Units 03/17/2022    6:33 AM  CMP  Glucose 70 - 99 mg/dL 96   BUN 8 - 23 mg/dL 25   Creatinine 0.44 - 1.00 mg/dL 1.32   Sodium 135 - 145 mmol/L 128   Potassium 3.5 - 5.1 mmol/L 4.0   Chloride 98 - 111 mmol/L 97   CO2 22 - 32 mmol/L 21   Calcium 8.9 - 10.3 mg/dL 7.7       Latest Ref Rng & Units  03/17/2022    6:33 AM  CBC  WBC 4.0 - 10.5 K/uL 8.6   Hemoglobin 12.0 - 15.0 g/dL 11.0   Hematocrit 36.0 - 46.0 % 32.4  Platelets 150 - 400 K/uL 243     @IMAGES @  DG Hip Port Unilat With Pelvis 1V Left  Result Date: 03/14/2022 CLINICAL DATA:  Status post left hip replacement EXAM: DG HIP (WITH OR WITHOUT PELVIS) 1V PORT LEFT COMPARISON:  None Available. FINDINGS: Interval postsurgical changes from left total hip arthroplasty for left femoral neck fracture. Arthroplasty components appear in their expected alignment. Numerous scattered lytic lesions compatible with known diffuse osseous metastatic disease. Expected postoperative changes within the overlying soft tissues. IMPRESSION: Postsurgical changes from left total hip arthroplasty. Electronically Signed   By: Yetta Glassman M.D.   On: 03/14/2022 14:54   DG Knee Complete 4 Views Right  Result Date: 03/13/2022 CLINICAL DATA:  Bilateral knee pain EXAM: RIGHT KNEE - COMPLETE 4+ VIEW COMPARISON:  None Available. FINDINGS: Alignment is anatomic. No joint effusion. No acute fracture. No significant joint space narrowing. Vascular calcifications. IMPRESSION: No significant osseous abnormality. Electronically Signed   By: Macy Mis M.D.   On: 03/13/2022 13:05   DG Knee Complete 4 Views Left  Result Date: 03/13/2022 CLINICAL DATA:  Left knee injury. EXAM: LEFT KNEE - COMPLETE 4+ VIEW COMPARISON:  None Available. FINDINGS: No evidence of fracture, dislocation, or joint effusion. No evidence of arthropathy or other focal bone abnormality. Soft tissue edema superior to the left knee. IMPRESSION: 1. No acute fracture or dislocation identified about the left knee. 2. Soft tissue edema superior to the left knee. Electronically Signed   By: Fidela Salisbury M.D.   On: 03/13/2022 13:04   ECHOCARDIOGRAM COMPLETE  Result Date: 03/13/2022    ECHOCARDIOGRAM REPORT   Patient Name:   MERCADEZ HEITMAN [facility] Date of Exam: 03/13/2022 Medical Rec #:  [id number]       Height:       61.0 in Accession #:    [id number]     Weight:       112.4 lb Date of Birth:  15-Jun-1951      BSA:          1.479 m Patient Age:    71 years       BP:           126/79 mmHg Patient Gender: F              HR:           100 bpm. Exam Location:  [facility] Procedure: 2D Echo, Color Doppler and Cardiac Doppler Indications:     CAD native vessel I25.10  History:         Patient has prior history of Echocardiogram examinations, most                  recent 12/06/2021. CAD; Risk Factors:Hypertension and Current                  Smoker. Pneumonia.  Sonographer:     Sherrie Sport Referring Phys:  [id number] [place] TANG Diagnosing Phys: Yolonda Kida MD  Sonographer Comments: Suboptimal apical window and suboptimal subcostal window. Image acquisition challenging due to COPD. IMPRESSIONS  1. Left ventricular ejection fraction, by estimation, is 65 to 70%. The left ventricle has normal function. The left ventricle has no regional wall motion abnormalities. There is mild left ventricular hypertrophy. Left ventricular diastolic parameters are consistent with Grade I diastolic dysfunction (impaired relaxation).  2. Right ventricular systolic function is normal. The right ventricular size is normal.  3. The mitral valve is normal in structure. Trivial mitral valve regurgitation.  4.  The aortic valve is normal in structure. Aortic valve regurgitation is not visualized. FINDINGS  Left Ventricle: Left ventricular ejection fraction, by estimation, is 65 to 70%. The left ventricle has normal function. The left ventricle has no regional wall motion abnormalities. The left ventricular internal cavity size was normal in size. There is  mild left ventricular hypertrophy. Left ventricular diastolic parameters are consistent with Grade I diastolic dysfunction (impaired relaxation). Right Ventricle: The right ventricular size is normal. No increase in right ventricular wall thickness. Right ventricular systolic function is normal.  Left Atrium: Left atrial size was normal in size. Right Atrium: Right atrial size was normal in size. Pericardium: There is no evidence of pericardial effusion. Mitral Valve: The mitral valve is normal in structure. Trivial mitral valve regurgitation. Tricuspid Valve: The tricuspid valve is normal in structure. Tricuspid valve regurgitation is trivial. Aortic Valve: The aortic valve is normal in structure. Aortic valve regurgitation is not visualized. Aortic valve mean gradient measures 4.5 mmHg. Aortic valve peak gradient measures 8.2 mmHg. Aortic valve area, by VTI measures 3.48 cm. Pulmonic Valve: The pulmonic valve was normal in structure. Pulmonic valve regurgitation is not visualized. Aorta: The ascending aorta was not well visualized. IAS/Shunts: No atrial level shunt detected by color flow Doppler.  LEFT VENTRICLE PLAX 2D LVIDd:         3.30 cm   Diastology LVIDs:         2.10 cm   LV e' medial:    4.68 cm/s LV PW:         1.20 cm   LV E/e' medial:  19.7 LV IVS:        1.20 cm   LV e' lateral:   13.30 cm/s LVOT diam:     2.00 cm   LV E/e' lateral: 6.9 LV SV:         70 LV SV Index:   47 LVOT Area:     3.14 cm  RIGHT VENTRICLE RV Basal diam:  3.80 cm RV S prime:     13.60 cm/s TAPSE (M-mode): 1.6 cm LEFT ATRIUM             Index        RIGHT ATRIUM           Index LA diam:        2.10 cm 1.42 cm/m   RA Area:     13.80 cm LA Vol (A2C):   15.3 ml 10.35 ml/m  RA Volume:   35.30 ml  23.87 ml/m LA Vol (A4C):   22.2 ml 15.01 ml/m LA Biplane Vol: 19.6 ml 13.26 ml/m  AORTIC VALVE AV Area (Vmax):    2.65 cm AV Area (Vmean):   2.73 cm AV Area (VTI):     3.48 cm AV Vmax:           143.50 cm/s AV Vmean:          94.750 cm/s AV VTI:            0.202 m AV Peak Grad:      8.2 mmHg AV Mean Grad:      4.5 mmHg LVOT Vmax:         121.00 cm/s LVOT Vmean:        82.200 cm/s LVOT VTI:          0.223 m LVOT/AV VTI ratio: 1.11  AORTA Ao Root diam: 3.57 cm MITRAL VALVE  TRICUSPID VALVE MV Area (PHT): 3.63  cm     TR Peak grad:   40.4 mmHg MV Decel Time: 209 msec     TR Vmax:        318.00 cm/s MV E velocity: 92.10 cm/s MV A velocity: 102.00 cm/s  SHUNTS MV E/A ratio:  0.90         Systemic VTI:  0.22 m                             Systemic Diam: 2.00 cm Yolonda Kida MD Electronically signed by Yolonda Kida MD Signature Date/Time: 03/13/2022/12:56:43 PM    Final    [place] EKG SITE RITE  Result Date: 03/13/2022 If Site Rite image not attached, placement could not be confirmed due to current cardiac rhythm.  DG HIP UNILAT WITH PELVIS 2-3 VIEWS LEFT  Result Date: 03/13/2022 CLINICAL DATA:  Left hip fracture. EXAM: DG HIP (WITH OR WITHOUT PELVIS) 2-3V LEFT COMPARISON:  CT yesterday. FINDINGS: Displaced left femoral neck fracture with likely underlying lytic lesion. Femoral head remains seated. There is proximal migration of the femoral shaft. The question pathologic fracture at the right puboacetabular junction is tentatively visualized. Many of the additional bone lesions on CT are not well seen by radiograph. There is excreted IV contrast in the urinary bladder which appears trabeculated. IMPRESSION: Displaced left femoral neck fracture with likely underlying lytic lesion. Possible pathologic fracture at the right puboacetabular junction on CT is tentatively visualized. Many of the additional bone lesions are not well seen by radiograph. Electronically Signed   By: Keith Rake M.D.   On: 03/13/2022 01:03   CT CHEST ABDOMEN PELVIS W CONTRAST  Result Date: 03/12/2022 CLINICAL DATA:  Metastatic disease evaluation. Osseous metastatic disease of the thoracic and lumbar spine. EXAM: CT CHEST, ABDOMEN, AND PELVIS WITH CONTRAST TECHNIQUE: Multidetector CT imaging of the chest, abdomen and pelvis was performed following the standard protocol during bolus administration of intravenous contrast. RADIATION DOSE REDUCTION: This exam was performed according to the departmental dose-optimization program which  includes automated exposure control, adjustment of the mA and/or kV according to patient size and/or use of iterative reconstruction technique. CONTRAST:  57m OMNIPAQUE IOHEXOL 300 MG/ML  SOLN COMPARISON:  Thoracic and lumbar spine MRI yesterday. Chest abdomen pelvis CT 12/06/2021 FINDINGS: CT CHEST FINDINGS Cardiovascular: Aortic atherosclerosis. Aortic tortuosity. The heart is normal in size. There are coronary artery calcifications. No pericardial effusion. Mediastinum/Nodes: Moderate-sized hiatal hernia. The esophagus is dilated and contains intraluminal debris to the level of the thoracic inlet. 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 The right thyroid gland may be surgically absent. No definite mediastinal or hilar adenopathy. Right axillary node measures 9 mm series 2, image 18. Lungs/Pleura: Small layering right pleural effusion. No convincing pleural nodularity or enhancement. Right lower lobe airspace disease is greater than typically seen with compressive atelectasis. Mild emphysema. Tiny 3 mm left lower lobe nodule series 4, image 84. This was previously obscured. Previous calcified right lower lobe nodule is again seen, surrounded by adjacent atelectatic lung. Central bronchial thickening. There is layering debris/mucus within the distal trachea and right mainstem bronchus. Mild dependent atelectasis in the left lower lobe. Musculoskeletal: Multiple healing anterior rib fractures typically seen with CPR. Many of these demonstrate delayed union. Sternal body fracture may be pathologic with adjacent sclerosis. Known thoracic osseous metastatic disease was assessed on thoracic spine MRI yesterday. Pathologic T4 and  T10 vertebral body compression fractures. Some of the lesions on MRI are visualized with lytic lesions, occasional areas of cortical sclerosis involving T3 and T4. There multiple small lucent rib lesions. The right subareolar breast tissue appears  slightly lobulated, but is similar in appearance to prior exam. There is generalized body wall edema. Left shoulder arthroplasty. CT ABDOMEN PELVIS FINDINGS Hepatobiliary: Patient had numerous liver lesions on spine MRI. These lesions are difficult to visualize on the current exam, however there is a 2.8 cm lesion in the right hepatic dome. Multiple additional low-density lesions scattered throughout the liver, difficult to assess due to phase of contrast. Some of these lesions were previously characterized as hemangioma on MRI 11/02/2021, however some of these lesions are definitely new. Prior cholecystectomy. Common bile duct measures 9 mm proximally, 10 mm distally. There is a 7 mm stone in the distal common bile duct, series 5, image 58. There is slight central intrahepatic biliary ductal dilatation. Pancreas: 7 mm low-density in the pancreatic head, also seen on prior MRI, characterized as cyst. No evidence of suspicious pancreatic lesion. There is slight proximal ductal prominence of 4 mm. No peripancreatic inflammation. Spleen: Normal in size without focal abnormality. Adrenals/Urinary Tract: No adrenal nodule. Mild left renal atrophy. Tiny hypodensities in the renal cortices are too small to characterize but likely small cysts. No solid renal lesion. No hydronephrosis. No bladder wall thickening. No dense of bladder lesion. Stomach/Bowel: Bowel assessment is limited in the absence of enteric contrast. There is a moderate hiatal hernia. Mild gastric wall hyperemia. No small bowel obstruction or inflammation. Normal appendix. Multifocal colonic diverticulosis. Stool distends the rectum. Circumferential anorectal thickening again seen, series 2, image 107. Vascular/Lymphatic: Aortic atherosclerosis. No aortic aneurysm. No definite abdominopelvic adenopathy. Reproductive: Atrophic uterus is tentatively visualized. No evidence of adnexal mass. Other: There is generalized edema of the subcutaneous and  intra-abdominal fat. No convincing abdominopelvic ascites. No omental thickening. Musculoskeletal: There is a displaced left femoral neck fracture, presumably pathologic. Possible pathologic fracture of the right superior puboacetabular junction innumerable osseous metastasis throughout the pelvis, multiple areas of low-density as well as trabecular coarsening. Lumbar metastatic disease better assessed on lumbar spine yesterday remote L5 compression fracture. IMPRESSION: 1. Osseous metastatic disease throughout the chest, abdomen, and pelvis, as described. 2. Multiple hepatic lesions which are poorly defined on the current exam due to phase of contrast, some of which are new from prior imaging and suspicious for metastatic disease. 3. Circumferential anorectal wall thickening, also seen on prior exam, recommend direct visualization. 4. Small layering right pleural effusion. Right lower lobe airspace disease is greater than typically seen with compressive atelectasis. This may be secondary to pneumonia, however aspiration is considered given there is layering debris in the right mainstem bronchus. No obvious pulmonary mass. 5. Choledocholithiasis with 7 mm stone in the distal common bile duct. Mild biliary ductal dilatation. 6. Moderate hiatal hernia with dilated esophagus and intraluminal debris to the level of the thoracic inlet. 7. Colonic diverticulosis without diverticulitis. 8. 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 9. Displaced left femoral neck fracture, presumably pathologic. Possible pathologic fracture of the right superior puboacetabular junction, possible pathologic fracture of the sternum. 10. Generalized body wall edema. Aortic Atherosclerosis (ICD10-I70.0) and Emphysema (ICD10-J43.9). Electronically Signed   By: Keith Rake M.D.   On: 03/12/2022 22:10   DG Chest Portable 1 View  Result Date: 03/12/2022 CLINICAL DATA:  Shortness of breath. EXAM:  PORTABLE CHEST 1 VIEW COMPARISON:  Chest  radiograph dated 12/07/2021. FINDINGS: Small right pleural effusion and left lung base atelectasis or infiltrate. Probable left lung base atelectasis/scarring. No pneumothorax. The cardiac silhouette is within limits. No acute osseous pathology. Osteopenia. Left shoulder arthroplasty. IMPRESSION: Probable small right pleural effusion and right lung base atelectasis or infiltrate. Electronically Signed   By: Anner Crete M.D.   On: 03/12/2022 21:14   MR THORACIC SPINE WO CONTRAST  Result Date: 03/12/2022 CLINICAL DATA:  Mid and lower back pain. EXAM: MRI THORACIC AND LUMBAR SPINE WITHOUT CONTRAST TECHNIQUE: Multiplanar and multiecho pulse sequences of the thoracic and lumbar spine were obtained without intravenous contrast. COMPARISON:  CT chest abdomen pelvis April 13, 23. MRI February 12, 23. FINDINGS: MRI THORACIC SPINE FINDINGS Alignment:  No substantial sagittal subluxation Vertebrae: Extensive/numerous T1 hypointense and STIR hyperintense lesions throughout the visualized lower cervical and thoracic spine, compatible with osseous metastatic disease. Lesions involve both the vertebral bodies as well as the posterior elements. Complete replacement of the bone marrow at T3 and T4. Height loss of the T4 and T10 vertebral bodies is compatible with pathologic fractures, new since CT on April 13, 23. Cord:  Normal cord signal. Paraspinal and other soft tissues: Layering right pleural effusion. Suspected dilated, fluid-filled esophagus. Disc levels: Poor definition of bone marrow cortex at T3 and T4 where there is complete marrow replacement, suggesting possible extraosseous extension of tumor although evaluation is limited without contrast. MRI LUMBAR SPINE FINDINGS Segmentation:  Standard. Alignment: Grade 1 retrolisthesis of L1 on L2 and L2 on L3 and grade 1 anterolisthesis of L5 on S1. Vertebrae: Numerous T1 hypointense and STIR hyperintense lesions throughout the  visualized lumbar spine and sacrum, concerning for osseous metastatic disease. Remote L5 compression fracture with similar height loss. Conus medullaris and cauda equina: Conus extends to the T12 level. Conus appears normal. Paraspinal and other soft tissues: Limited intra-abdominal evaluation due to motion/artifact. Numerous hepatic lesions on the coronal T2 image. Disc levels: T12-L1: No significant disc protrusion, foraminal stenosis, or canal stenosis. L1-L2: Posterior disc osteophyte complex and facet arthropathy. Moderate right foraminal stenosis. Right subarticular stenosis with otherwise patent canal. L2-L3: Disc height loss. Disc bulging and endplate spurring. Similar moderate right and mild left foraminal stenosis with out significant canal stenosis. L3-L4: Right eccentric disc bulging with bilateral facet arthropathy. Similar moderate to severe canal stenosis with severe bilateral subarticular recess stenosis. Similar mild-to-moderate right foraminal stenosis. L4-L5: Mild disc bulging bilateral facet arthropathy. Moderate left and mild right foraminal stenosis, similar. L5-S1: Facet arthropathy and mild disc bulging. Mild left foraminal stenosis, similar. IMPRESSION: 1. Findings compatible with extensive osseous metastatic disease throughout the visualized cervical, thoracic, and lumbar spine, as well as the sacrum. 2. Pathologic fractures of T4 and T10, new since CT on December 06 2021. Similar height loss of an L5 compression fracture. 3. Poor definition of bone marrow cortex at T3 and T4 where there is complete marrow replacement, suggesting possible extraosseous extension of tumor although evaluation is limited without contrast. Postcontrast imaging could further characterize if clinically warranted. 4. Multilevel degenerative change, greatest in the lumbar spine with similar moderate to severe canal stenosis at L3-L4 and multilevel foraminal stenosis, detailed above. 5. Partially imaged numerous hepatic  lesions, suspicious for metastatic disease given above findings. Also, layering right pleural effusion and suspected dilated fluid-filled esophagus. Recommend dedicated chest/abdominal/pelvic imaging to further evaluate disease burden and better evaluate for suspected primary malignancy. These results will be called to the ordering clinician or representative by the Radiologist Assistant, and communication  documented in the PACS or [employer]. Electronically Signed   By: Margaretha Sheffield M.D.   On: 03/12/2022 13:57   MR LUMBAR SPINE WO CONTRAST  Result Date: 03/12/2022 CLINICAL DATA:  Mid and lower back pain. EXAM: MRI THORACIC AND LUMBAR SPINE WITHOUT CONTRAST TECHNIQUE: Multiplanar and multiecho pulse sequences of the thoracic and lumbar spine were obtained without intravenous contrast. COMPARISON:  CT chest abdomen pelvis April 13, 23. MRI February 12, 23. FINDINGS: MRI THORACIC SPINE FINDINGS Alignment:  No substantial sagittal subluxation Vertebrae: Extensive/numerous T1 hypointense and STIR hyperintense lesions throughout the visualized lower cervical and thoracic spine, compatible with osseous metastatic disease. Lesions involve both the vertebral bodies as well as the posterior elements. Complete replacement of the bone marrow at T3 and T4. Height loss of the T4 and T10 vertebral bodies is compatible with pathologic fractures, new since CT on April 13, 23. Cord:  Normal cord signal. Paraspinal and other soft tissues: Layering right pleural effusion. Suspected dilated, fluid-filled esophagus. Disc levels: Poor definition of bone marrow cortex at T3 and T4 where there is complete marrow replacement, suggesting possible extraosseous extension of tumor although evaluation is limited without contrast. MRI LUMBAR SPINE FINDINGS Segmentation:  Standard. Alignment: Grade 1 retrolisthesis of L1 on L2 and L2 on L3 and grade 1 anterolisthesis of L5 on S1. Vertebrae: Numerous T1 hypointense and STIR  hyperintense lesions throughout the visualized lumbar spine and sacrum, concerning for osseous metastatic disease. Remote L5 compression fracture with similar height loss. Conus medullaris and cauda equina: Conus extends to the T12 level. Conus appears normal. Paraspinal and other soft tissues: Limited intra-abdominal evaluation due to motion/artifact. Numerous hepatic lesions on the coronal T2 image. Disc levels: T12-L1: No significant disc protrusion, foraminal stenosis, or canal stenosis. L1-L2: Posterior disc osteophyte complex and facet arthropathy. Moderate right foraminal stenosis. Right subarticular stenosis with otherwise patent canal. L2-L3: Disc height loss. Disc bulging and endplate spurring. Similar moderate right and mild left foraminal stenosis with out significant canal stenosis. L3-L4: Right eccentric disc bulging with bilateral facet arthropathy. Similar moderate to severe canal stenosis with severe bilateral subarticular recess stenosis. Similar mild-to-moderate right foraminal stenosis. L4-L5: Mild disc bulging bilateral facet arthropathy. Moderate left and mild right foraminal stenosis, similar. L5-S1: Facet arthropathy and mild disc bulging. Mild left foraminal stenosis, similar. IMPRESSION: 1. Findings compatible with extensive osseous metastatic disease throughout the visualized cervical, thoracic, and lumbar spine, as well as the sacrum. 2. Pathologic fractures of T4 and T10, new since CT on December 06 2021. Similar height loss of an L5 compression fracture. 3. Poor definition of bone marrow cortex at T3 and T4 where there is complete marrow replacement, suggesting possible extraosseous extension of tumor although evaluation is limited without contrast. Postcontrast imaging could further characterize if clinically warranted. 4. Multilevel degenerative change, greatest in the lumbar spine with similar moderate to severe canal stenosis at L3-L4 and multilevel foraminal stenosis, detailed above.  5. Partially imaged numerous hepatic lesions, suspicious for metastatic disease given above findings. Also, layering right pleural effusion and suspected dilated fluid-filled esophagus. Recommend dedicated chest/abdominal/pelvic imaging to further evaluate disease burden and better evaluate for suspected primary malignancy. These results will be called to the ordering clinician or representative by the Radiologist Assistant, and communication documented in the PACS or [employer]. Electronically Signed   By: Margaretha Sheffield M.D.   On: 03/12/2022 13:57     Assessment and plan- Patient is a 71 y.o. female with palpable right breast mass, diffuse bone and  liver metastases concerning for metastatic breast cancer new diagnosis  Tumor marker CA 27-29 and CA 15-3 elevated further raising the probability of this being metastatic breast cancer.  Patient underwentLeft hip hemiarthroplasty yesterday and is doing well postoperatively.  Await pathology results.  Hold off on liver biopsy.  Patient also has T4 and T10 compression fractures and has been approved for osteoporosis therapy which would include biopsy ablation and kyphoplasty which will be done by IR either inpatient or outpatient.  Ideally the patient can get this done as an inpatient will allow better control of her back pain.  Plan from my standpoint would be to wait for final pathology results and I will follow-up with her as an outpatient to discuss further management.  ER/PR and HER2 testing if this turns out to be breast cancer will take another week to 10 days.  Patient states that she is not interested in trying chemotherapy but is willing to consider oral therapy which can be attempted if this is ER positive breast cancer.  Continue pain management as in preparation for discharge to rehab.  Appreciate palliative care input in adjusting her pain medications and emotional support  AKI: Serum creatinine is trending upwards and urine output is  going down.  Consider IV fluids  Disposition: It remains to be seen if patient can go home with home physical therapy versus subacute rehab.Patient's sister is willing to help her but there is no 24-hour care available at home   Visit Diagnosis 1. Pathological fracture of neck of left femur, initial encounter ([place])   2. Metastatic cancer to bone ([place])   3. Bilateral leg pain   4. Protein-calorie malnutrition, severe   5. Compression fracture of T4 vertebra, initial encounter ([place])   6. Compression fracture of T10 vertebra, initial encounter [facility])      Dr. Randa Evens, MD, MPH [facility] at [facility] [phone] 03/17/2022 3:42 PM

## 2022-03-15 NOTE — Progress Notes (Addendum)
PROGRESS NOTE    Kathleen Blankenship  VEL:[id number] DOB: [birth year] DOA: 03/12/2022 PCP: Tracie Harrier, MD    Chief Complaint  Patient presents with   Urinary Retention   Back Pain    Brief Narrative:  Patient is a pleasant 71 year old female, history of CAD, GERD, hypertension, PEA cardiac arrest postoperatively after lap chole on 11/2011, underwent cardiac cath 2018 with insignificant CAD, ongoing tobacco use presented to [facility] 03/12/2022 with progressive worsening back and left hip pain times several weeks with difficulty ambulating.  MRI was done for further evaluation of her pain and resulted in extensive osseous metastatic disease.  CT of the L-spine done showed pathologic fractures of T4 and T10, CT chest abdomen and pelvis as well as plain films of the left hip and pelvis consistent with a displaced left femoral neck fracture.  Patient noted to also have a 30 pound unintentional weight loss over the last 6 months, difficulty emptying bladder with urgency, mild dysuria.  Patient noted to have also had an epidural injection on 02/07/2022.  Patient seen in the ED labs showed hyponatremia with a sodium of 130, elevated alk phosphatase of 174, albumin of 2.5, AST of 53, CT chest abdomen and pelvis with osseous metastatic disease throughout chest abdomen and pelvis, multiple hepatic lesions which are poorly defined, circumferential anorectal wall thickening, small layering right pleural effusion, right lower lobe airspace disease greater than typically seen with compressive atelectasis may be secondary to pneumonia versus aspiration, choledocholithiasis with 7 mm stone in the distal common bile duct, mild biliary ductal dilatation, moderate hiatal hernia with dilated esophagus and intraluminal debris to the level of the thoracic inlet, colonic diverticulosis, 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   Orthopedics consulted.  Oncology consulted.  Cardiology consulted for preop clearance.    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 - Patient admitted to [place] floor and subsequently placed on telemetry. -Pain management to be provided. - Orthopedic (Dr Leamon Arnt) consultation obtained, patient assessed by orthopedics and recommended transfusion of a unit of packed red blood cells in anticipation for possible surgery on 03/14/2022, recommended left hip hemiarthroplasty which was done 03/14/2022, as treatment for her fracture which will help improve her left hip pain and opportunity to return to weightbearing and ambulation. -Oncology ( Dr Janese Banks) consulted . -Cardiology consulted for preop clearance.  -Patient status post left hemiarthroplasty on 03/14/2022 without any complications. -Surgical pathology pending. -PT/OT. -Per orthopedics.  Dyslipidemia - Continue statin.  Pleural effusion on right - This could be associated with right lower lobe airspace disease/pneumonia. - She does have cough however without fever or leukocytosis and she is a chronic smoker. -Patient with concern for metastatic disease, pathologic fracture, recent weight loss, noted  to be  on [place], [place] and likely immunocompromised and as such patient placed empirically on IV Zosyn for probable pneumonia.   -Continue to hold [place].  -Supportive care  Compression fracture of body of thoracic vertebra ([place]) - Noted at T4, T10 likely pathologic in nature versus secondary to osteoporosis. -IR consulted for evaluation for kyphoplasty.  UTI (urinary tract infection) - Urinalysis concerning for UTI with moderate leukocytes, positive nitrite, many bacteria. -Urine cultures with multiple species. -Currently on IV Zosyn.  Anemia - Patient with no overt bleeding. -Anemia panel consistent with anemia of chronic disease.  -Status post transfuse 1 unit packed red blood cells in anticipation of orthopedic surgery on 03/14/2022.  - Today as recommended by orthopedic surgeon. -Hemoglobin at 7.3 this morning from 9.6.   -Transfuse 2 units packed red blood cells.   -Follow H&H.  Metastatic cancer to bone [facility]) - Patient being followed by oncology. -Oncology concerned primary likely from the breast. -Patient for Left pathologic femur fracture repair on 03/14/2022, tissue biopsy/pathology pending.  -Breast tumor markers (CA 27-29, CEA 15-3) ordered per oncology noted to be elevated.   -Pathology results pending -See problem #1 above -Per oncology. -Palliative care consulted and following.  Protein-calorie malnutrition, severe -Continue nutritional supplementation.  PNA (pneumonia) - Patient with history of rheumatoid arthritis on Rinvoq and leflunomide with concerns for metastatic Pathologic Fracture. -CT chest with small layering right pleural effusion, right lower lobe airspace disease greater than typically seen with compressive atelectasis may be secondary to pneumonia however aspiration is considered given layering debris in the right mainstem bronchus.  No obvious pulmonary mass. -Patient also with moderate hiatal hernia with dilated esophagus and intraluminal debris to the  level of the thoracic inlet noted. -Urine strep pneumococcus antigen negative.  Urine Legionella antigen negative -Continue IV Zosyn, Mucinex.    GERD (gastroesophageal reflux disease) Continue PPI therapy.  Anxiety and depression -Continue home regimen Zoloft. -Noted on med rec patient was not taking Valium. -Continue Ativan 0.5 mg twice daily as needed.  Essential hypertension -Continue home regimen Toprol-XL, Cozaar.  Choledocholithiasis - This is currently asymptomatic -Follow-up.  Rheumatoid arthritis ([place]) - The patient is on Rinvoq and leflunomide. -Leflunomide held. -Outpatient follow-up.         DVT prophylaxis: SCDs, postop DVT prophylaxis per orthopedics. Code Status: DNR Family Communication: Updated patient, sister at bedside. Disposition: TBD  Status is: Inpatient Remains inpatient appropriate because: Severity of illness   Consultants:  Oncology: Dr.Rao 03/13/2022 Orthopedics: Dr. Mack Guise 03/13/2022 Cardiology: Orinda Kenner, PA 03/13/2022 Neurosurgery: Dr. Cari Caraway 03/14/2022   Procedures:  CT chest abdomen and pelvis 03/12/2022 Chest x-ray 03/12/2022 Plain films of the left hip and pelvis 03/13/2022 Plain films of the left knee 03/13/2022 2D echo 03/13/2022 PICC line placement 03/13/2022 Transfused 2 units packed red blood cells 03/15/2022 Transfuse 1 unit packed red blood cells 03/13/2022 Status post left hip hemiarthroplasty per orthopedics, Dr. Agustin Cree 03/14/2022    Antimicrobials:  IV Zosyn 03/13/2022>>>>>   Subjective: Sitting up in chair with brace on.  No chest pain.  Patient with some shortness of breath however improved since admission.  No abdominal pain.  Left hip pain improved.  Sister at bedside.  .  Objective: Vitals:   03/15/22 1501 03/15/22 1715 03/15/22 1806 03/15/22 1917  BP: 121/64 118/84 (!) 135/106 (!) 110/94  Pulse: 96 (!) 145 (!) 122 (!) 115  Resp: 18 18 18 20   Temp: 97.7 F (36.5 C) 97.7 F (36.5 C)  98.1 F (36.7  C)  TempSrc: Oral Oral    SpO2:    (!) 84%  Weight:      Height:        Intake/Output Summary (Last 24 hours) at 03/15/2022 1932 Last data filed at 03/15/2022 1715 Gross per 24 hour  Intake 1627.04 ml  Output 1800 ml  Net -172.96 ml   Filed Weights   03/12/22 2027 03/14/22 0957  Weight: 51 kg 51 kg    Examination:  General exam: NAD Respiratory system: Lungs clear to auscultation bilaterally.  No wheezes, no crackles, no rhonchi.  Fair air movement.  Speaking in full sentences.   Cardiovascular system: Regular rate rhythm no murmurs rubs or gallops.  No JVD.  No lower extremity edema.  Gastrointestinal system: Abdomen is nondistended, soft and nontender. No organomegaly or masses felt. Normal bowel sounds heard. Central nervous system: Alert and oriented. No focal neurological deficits. Extremities: No clubbing cyanosis or edema.   Skin: No rashes, lesions or ulcers Psychiatry: Judgement and insight appear normal. Mood & affect appropriate.     Data Reviewed:   CBC: Recent Labs  Lab 03/12/22 2025 03/13/22 0149 03/14/22 0555 03/15/22 0445 03/15/22 1800  WBC 9.6 6.5 5.3 8.3  --   NEUTROABS 7.5  --  3.7 6.5  --   HGB 8.2* 8.5* 9.6* 7.3* 12.2  HCT 25.8* 25.5* 28.7* 22.2* 36.1  MCV 92.8 90.7 88.6 91.0  --   PLT 498* 384 396 289  --     Basic Metabolic Panel: Recent Labs  Lab 03/12/22 2025 03/13/22 0149 03/14/22 0555 03/14/22 0915 03/15/22 0445  NA 130* 132*  --  136 131*  K 4.4 4.6  --  3.7 5.4*  CL 100 103  --  105 105  CO2 19* 23  --  21* 23  GLUCOSE 105* 124*  --  94 146*  BUN 20 19  --  11 11  CREATININE 0.60 0.74  --  0.58 0.52  CALCIUM 8.2* 8.0*  --  7.8* 6.6*  MG  --   --  1.9  --   --   PHOS  --   --  2.9  --   --     GFR: Estimated Creatinine Clearance: 50.1 mL/min (by C-G formula based on SCr of 0.52 mg/dL).  Liver Function Tests: Recent Labs  Lab 03/12/22 2025 03/13/22 0149 03/14/22 0915 03/15/22 0445  AST 53* 53* 38 38  ALT 21 22  21 16   ALKPHOS 174* 187* 149* 119  BILITOT 1.0 0.7 0.8 0.7  PROT 5.5* 5.8* 5.1* 4.1*  ALBUMIN 2.5* 2.5* 2.2* 1.9*    CBG: No results for input(s): "GLUCAP" in the last 168 hours.   Recent Results (from the past 240 hour(s))  Blood culture (routine x 2)     Status: None (Preliminary result)   Collection Time: 03/12/22  8:25 PM   Specimen: BLOOD  Result Value Ref Range Status   Specimen Description BLOOD BLOOD LEFT FOREARM  Final   Special Requests   Final    BOTTLES DRAWN AEROBIC AND ANAEROBIC Blood Culture results may not be optimal due to an inadequate volume of blood received in culture bottles   Culture   Final    NO GROWTH 3 DAYS Performed at [facility], [address] [place], [place] [address]    Report Status PENDING  Incomplete  Blood culture (routine x 2)     Status: None (Preliminary result)   Collection Time: 03/12/22  8:25 PM   Specimen:  BLOOD  Result Value Ref Range Status   Specimen Description BLOOD BLOOD LEFT HAND  Final   Special Requests   Final    BOTTLES DRAWN AEROBIC AND ANAEROBIC Blood Culture results may not be optimal due to an inadequate volume of blood received in culture bottles   Culture   Final    NO GROWTH 3 DAYS Performed at [facility], [address], [place], [place] [address]    Report Status PENDING  Incomplete  Expectorated Sputum Assessment w Gram Stain, Rflx to Resp Cult     Status: None   Collection Time: 03/13/22 10:40 AM   Specimen: Expectorated Sputum  Result Value Ref Range Status   Specimen Description EXPECTORATED SPUTUM  Final   Special Requests NONE  Final   Sputum evaluation   Final    THIS SPECIMEN IS ACCEPTABLE FOR SPUTUM CULTURE Performed at [facility], [address] [place], [place] [address]    Report Status 03/13/2022 FINAL  Final  Culture, Respiratory w Gram Stain     Status: None (Preliminary result)   Collection Time: 03/13/22 10:40 AM  Result Value Ref Range Status    Specimen Description   Final    EXPECTORATED SPUTUM Performed at [facility], [address], [place], [place] [address]    Special Requests   Final    NONE Reflexed from [id number] Performed at [facility], [address] [place], [place] [address]    Gram Stain   Final    ABUNDANT WBC PRESENT, PREDOMINANTLY PMN ABUNDANT GRAM NEGATIVE RODS FEW GRAM POSITIVE COCCI RARE BUDDING YEAST SEEN    Culture   Final    MODERATE GRAM NEGATIVE RODS IDENTIFICATION AND SUSCEPTIBILITIES TO FOLLOW Performed at [facility], [place] [address] [place], [place] [address]    Report Status PENDING  Incomplete  Urine Culture     Status: Abnormal   Collection Time: 03/13/22 10:43 AM   Specimen: Urine, Catheterized  Result Value Ref Range Status   Specimen Description   Final    URINE, CATHETERIZED Performed at [facility], [address], [place], [place] [address]    Special Requests   Final    NONE Performed at [facility], [address] [place], [place] [address]    Culture MULTIPLE SPECIES PRESENT, SUGGEST RECOLLECTION (A)  Final   Report Status 03/14/2022 FINAL  Final  Aerobic/Anaerobic Culture w Gram Stain (surgical/deep wound)     Status: None (Preliminary result)   Collection Time: 03/14/22 11:36 AM   Specimen: PATH Other; Body Fluid  Result Value Ref Range Status   Specimen Description   Final    HIP LEFT FEMORAL NECK FRACTURE Performed at [facility], [address] [place], [place] [address]    Special Requests   Final    NONE Performed at [facility], [address] [place], [place] [address]    Gram Stain   Final    RARE WBC PRESENT, PREDOMINANTLY MONONUCLEAR NO ORGANISMS SEEN    Culture   Final    NO GROWTH < 24 HOURS Performed at [facility], [place] [address], [place], [place] [address]    Report Status PENDING  Incomplete  Aerobic/Anaerobic Culture w Gram Stain (surgical/deep wound)     Status:  None (Preliminary result)   Collection Time: 03/14/22 11:57 AM   Specimen: PATH Other; Body Fluid  Result Value Ref Range Status   Specimen Description   Final  HIP LEFT FEMORAL FLUID Performed at [facility], [place] [address] [place], [place] [address]    Special Requests   Final    NONE Performed at [facility], [place], [place] [address]    Gram Stain NO WBC SEEN NO ORGANISMS SEEN   Final   Culture   Final    NO GROWTH < 24 HOURS Performed at [facility], [place] [address] [place], [place] [address]    Report Status PENDING  Incomplete         Radiology Studies: DG Hip Port Unilat With Pelvis 1V Left  Result Date: 03/14/2022 CLINICAL DATA:  Status post left hip replacement EXAM: DG HIP (WITH OR WITHOUT PELVIS) 1V PORT LEFT COMPARISON:  None Available. FINDINGS: Interval postsurgical changes from left total hip arthroplasty for left femoral neck fracture. Arthroplasty components appear in their expected alignment. Numerous scattered lytic lesions compatible with known diffuse osseous metastatic disease. Expected postoperative changes within the overlying soft tissues. IMPRESSION: Postsurgical changes from left total hip arthroplasty. Electronically Signed   By: Yetta Glassman M.D.   On: 03/14/2022 14:54        Scheduled Meds:  atorvastatin  10 mg Oral Daily   Chlorhexidine Gluconate Cloth  6 each Topical Daily   cholecalciferol  5,000 Units Oral Daily   docusate sodium  100 mg Oral BID   feeding supplement  237 mL Oral BID BM   [START ON 03/16/2022] gabapentin  300 mg Oral QHS   guaiFENesin  1,200 mg Oral BID   metoprolol succinate  50 mg Oral Daily   multivitamin with minerals  1 tablet Oral Daily   pantoprazole  40 mg Oral Daily   senna  1 tablet Oral BID   sertraline  50 mg Oral QHS   sodium chloride flush  10-40 mL Intracatheter Q12H   traMADol  50 mg Oral Q6H   Continuous Infusions:  sodium chloride 0 mL/hr at  03/13/22 1341   diltiazem (CARDIZEM) infusion     heparin     piperacillin-tazobactam (ZOSYN)  IV 12.5 mL/hr at 03/15/22 1526     LOS: 3 days    Time spent: 40 minutes    Irine Seal, MD [facility]   To contact the attending provider between 7A-7P or the covering provider during after hours 7P-7A, please log into the web site [email or web address] and access using universal [place] password for that web site. If you do not have the password, please call the hospital operator.  03/15/2022, 7:32 PM

## 2022-03-15 NOTE — Plan of Care (Addendum)
Provided cross coverage for Dr. Grandville Silos on patient this evening.  71yo female admitted on 7/19 with back pain, urinary retention.  Work-up found to show thoracic and lumbar osseous metastatic disease. Also found to have displaced left femoral neck fracture, presumed pathologic.  Being treated for pneumonia as well.  She underwent cemented left hip hemiarthroplasty on 03/14/2022 with orthopedic surgery. She has been evaluated by oncology as well during hospitalization.  She has not wished to pursue aggressive chemo but was interested in other treatments.  Plan is for outpatient follow-up with oncology to discuss further management.  This evening she developed A-fib with RVR (Rate 130s - 140s).  Confirmed on EKG.  She is asymptomatic and denies any chest pain or shortness of breath. Vitals noted.  She is afebrile, RVR, respiratory rate 18.  Blood pressure normotensive, 118/84 Hemoglobin this morning 7.3 g/dL.  She received 2 units PRBC and repeat hemoglobin is 12.2 g/dL which seems disproportionate.   She was given a Cardizem push, 10 mg which controlled heart rate briefly into the 110s which then reverted back into the 130s.  She again remains asymptomatic during this time.  I discussed plan and work-up with patient bedside along with her daughter on the phone who plans to return to the hospital.  Plan: - s/p 10 mg IV cardizem push - start on cardizem drip; just had echo on 7/19 (EF 65-70%, Gr 1 DD, mild LVH) - start heparin drip, no bolus (CHA2DS2-VASc = 3 points min) and now with active presumed malignancy, her risk is at least moderate to high - closely watch Hgb after starting heparin; if drops precipitously, may need to reconsider risk/benefit of anticoagulation -Cardiology consulted - follow up TSH and troponin  -Transfer to progressive in order to continue Cardizem drip -Currently on 50 mg Toprol.  May need to be further adjusted versus transition to oral Cardizem pending cardiology  evaluation  Dwyane Dee, MD [facility] 03/15/2022, 6:30 PM

## 2022-03-15 NOTE — Care Management Important Message (Signed)
Important Message  Patient Details  Name: Kathleen Blankenship MRN: [id number] Date of Birth: 1950/10/17   Medicare Important Message Given:  Yes     Juliann Pulse A Jemeka Wagler 03/15/2022, 2:39 PM

## 2022-03-15 NOTE — TOC CM/SW Note (Signed)
Went by room to discuss therapy recommendations but patient receiving patient care. Will follow up again later as time allows.  Kathleen Blankenship, [place]

## 2022-03-15 NOTE — Progress Notes (Signed)
MD notified of elevated HR at this time. 140-160s, CCMD notified this RN. 2nd unit of RBC transfusion completed at this time.

## 2022-03-15 NOTE — Assessment & Plan Note (Signed)
-   Noted at T4, T10 likely pathologic in nature versus secondary to osteoporosis. -IR consulted for evaluation for kyphoplasty.

## 2022-03-15 NOTE — Evaluation (Addendum)
Physical Therapy Evaluation Patient Details Name: Kathleen Blankenship MRN: [id number] DOB: [birth year] Today's Date: 03/15/2022  History of Present Illness  Pt is a 71 y.o. Female that presente to the ED for back pain, urinary retention. 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 Underwent L hemiarthroplasty 7/20. PMH of HTN, CAD, anxiety, cholecystectomy complicated by brief PEA cardiac arrest.   Clinical Impression  Pt cleared for mobility by MD with elevated K+ (5.4). Patient alert, agreeable to PT, the patient and family in room (her sister) provided PLOF information. Pt for the last couple of weeks has needed more assistance for ADLs and mobility, including assist to stand pivot to WC.   The patient was able to move extremities against gravity. Supine to sit with modA, and pt fatigued with sitting but able to complete. TLSO donned with totalA. Sit <> stand with RW and modA, and able to step pivot to recliner modA as well. Up in chair with needs in reach. Extensive time spent reviewing discharge options, resources, hip precautions, TLSO wear, etc.  Overall the patient demonstrated deficits (see "PT Problem List") that impede the patient's functional abilities, safety, and mobility and would benefit from skilled PT intervention. Recommendation at this time is HHPT with frequent/constant supervision/assistance       Recommendations for follow up therapy are one component of a multi-disciplinary discharge planning process, led by the attending physician.  Recommendations may be updated based on patient status, additional functional criteria and insurance authorization.  Follow Up Recommendations Home health PT      Assistance Recommended at Discharge Frequent or constant Supervision/Assistance  Patient can return home with the  following  Two people to help with walking and/or transfers;A lot of help with bathing/dressing/bathroom;Assistance with feeding;Assist for transportation;Direct supervision/assist for financial management;Assistance with cooking/housework;Help with stairs or ramp for entrance;Direct supervision/assist for medications management    Equipment Recommendations None recommended by PT  Recommendations for Other Services       Functional Status Assessment Patient has had a recent decline in their functional status and demonstrates the ability to make significant improvements in function in a reasonable and predictable amount of time.     Precautions / Restrictions Precautions Precautions: Fall;Posterior Hip Precaution Booklet Issued: Yes (comment) Required Braces or Orthoses:  (TLSO) Restrictions Weight Bearing Restrictions: Yes LLE Weight Bearing: Weight bearing as tolerated      Mobility  Bed Mobility Overal bed mobility: Needs Assistance Bed Mobility: Supine to Sit     Supine to sit: Mod assist          Transfers Overall transfer level: Needs assistance Equipment used: Rolling walker (2 wheels) Transfers: Sit to/from Stand Sit to Stand: Mod assist                Ambulation/Gait               General Gait Details: deferred due to fatigue  Stairs            Wheelchair Mobility    Modified Rankin (Stroke Patients Only)       Balance Overall balance assessment: Needs assistance Sitting-balance support: Feet supported, Bilateral upper extremity supported Sitting balance-Leahy Scale: Poor     Standing balance support: Reliant on assistive device for balance Standing balance-Leahy Scale: Poor  Pertinent Vitals/Pain Pain Assessment Pain Assessment: Faces Faces Pain Scale: Hurts even more Pain Location: L hip with movement Pain Descriptors / Indicators: Grimacing, Guarding, Moaning, Aching Pain  Intervention(s): Limited activity within patient's tolerance, Monitored during session, Repositioned    Home Living Family/patient expects to be discharged to:: Private residence Living Arrangements: Alone Available Help at Discharge: Family;Friend(s) (nearly 24/7 supervision at least, recently) Type of Home: Mobile home Home Access: Ramped entrance       Home Layout: One level Home Equipment: Conservation officer, nature (2 wheels);Cane - single point;Transport chair;BSC/3in1;Shower seat;Wheelchair - manual      Prior Function Prior Level of Function : Needs assist       Physical Assist : Mobility (physical);ADLs (physical) Mobility (physical): Bed mobility;Transfers;Gait ADLs (physical): Bathing;Dressing;Toileting Mobility Comments: physical assist for transfers for commode as well as WC       Hand Dominance   Dominant Hand: Right    Extremity/Trunk Assessment   Upper Extremity Assessment Upper Extremity Assessment: Generalized weakness    Lower Extremity Assessment Lower Extremity Assessment: Generalized weakness    Cervical / Trunk Assessment Cervical / Trunk Assessment: Kyphotic  Communication   Communication: No difficulties  Cognition Arousal/Alertness: Awake/alert Behavior During Therapy: WFL for tasks assessed/performed Overall Cognitive Status: Within Functional Limits for tasks assessed                                          General Comments      Exercises Other Exercises Other Exercises: extensive discussion over DME, resources, discharge plans   Assessment/Plan    PT Assessment Patient needs continued PT services  PT Problem List Decreased strength;Decreased mobility;Decreased range of motion;Decreased activity tolerance;Pain;Decreased knowledge of use of DME;Decreased knowledge of precautions       PT Treatment Interventions DME instruction;Therapeutic exercise;Balance training;Gait training;Stair training;Neuromuscular  re-education;Functional mobility training;Therapeutic activities;Patient/family education    PT Goals (Current goals can be found in the Care Plan section)  Acute Rehab PT Goals Patient Stated Goal: to go home PT Goal Formulation: With patient Time For Goal Achievement: 03/29/22 Potential to Achieve Goals: Good    Frequency 7X/week     Co-evaluation               AM-PAC PT "6 Clicks" Mobility  Outcome Measure Help needed turning from your back to your side while in a flat bed without using bedrails?: A Little Help needed moving from lying on your back to sitting on the side of a flat bed without using bedrails?: A Lot Help needed moving to and from a bed to a chair (including a wheelchair)?: A Lot Help needed standing up from a chair using your arms (e.g., wheelchair or bedside chair)?: A Lot Help needed to walk in hospital room?: A Lot Help needed climbing 3-5 steps with a railing? : Total 6 Click Score: 12    End of Session Equipment Utilized During Treatment: Gait belt Activity Tolerance: Patient limited by fatigue Patient left: in chair;with call bell/phone within reach Nurse Communication: Mobility status PT Visit Diagnosis: Other abnormalities of gait and mobility (R26.89);Difficulty in walking, not elsewhere classified (R26.2);Muscle weakness (generalized) (M62.81);Pain Pain - Right/Left: Left Pain - part of body: Hip    Time: 4782-9562 PT Time Calculation (min) (ACUTE ONLY): 38 min   Charges:   PT Evaluation $PT Eval Low Complexity: 1 Low PT Treatments $Therapeutic Activity: 23-37 mins  Lieutenant Diego PT, DPT 12:31 PM,03/15/22

## 2022-03-15 NOTE — Evaluation (Signed)
Occupational Therapy Evaluation Patient Details Name: Kathleen Blankenship MRN: [id number] DOB: [birth year] Today's Date: 03/15/2022   History of Present Illness Pt is a 71 y.o. Female that presente to the ED for back pain, urinary retention. 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 Underwent L hemiarthroplasty 7/20. PMH of HTN, CAD, anxiety, cholecystectomy complicated by brief PEA cardiac arrest.   Clinical Impression   [name] presents with generalized weakness, limited endurance, impaired balance, and pain. She lives alone in a single-story home with ramped entrance, but reports that she has friends and family who are willing to stay with her 24/7 if needed. During today's evaluation, pt reports 6/10 pain "all over." She is visibly uncomfortable with EOB sitting, requires Mod A for bed mobility, and demonstrates poor sitting balance. Pt reports she is too tired/uncomfortable for OOB movement, so elects to use bed pan for BM, Max A required for toileting, Min A for rolling in bed. Discussed PoC w/ pt and her sister, with pt stating that she has no interest in SNF. Her goal is to return home, get things "in order," and focus on quality of life. Recommend acute OT to assist pt with improved mobility following hip surgery, as well as HHOT to address home safety, falls prevention, etc., until time that pt elects to enroll in hospice services.    Recommendations for follow up therapy are one component of a multi-disciplinary discharge planning process, led by the attending physician.  Recommendations may be updated based on patient status, additional functional criteria and insurance authorization.   Follow Up Recommendations  Home health OT    Assistance Recommended at Discharge Frequent or constant Supervision/Assistance  Patient  can return home with the following A little help with walking and/or transfers;A lot of help with bathing/dressing/bathroom;Assistance with cooking/housework    Functional Status Assessment  Patient has had a recent decline in their functional status and demonstrates the ability to make significant improvements in function in a reasonable and predictable amount of time.  Equipment Recommendations       Recommendations for Other Services       Precautions / Restrictions Precautions Precautions: Fall;Posterior Hip Restrictions Weight Bearing Restrictions: Yes LLE Weight Bearing: Weight bearing as tolerated      Mobility Bed Mobility Overal bed mobility: Needs Assistance Bed Mobility: Supine to Sit, Sit to Supine, Rolling Rolling: Min assist   Supine to sit: Mod assist Sit to supine: Mod assist        Transfers                   General transfer comment: pt declined      Balance Overall balance assessment: Needs assistance Sitting-balance support: Feet supported, Bilateral upper extremity supported Sitting balance-Leahy Scale: Poor                                     ADL either performed or assessed with clinical judgement   ADL Overall ADL's : Needs assistance/impaired                         Toilet Transfer: Maximal assistance Toilet Transfer Details (indicate cue type and reason): using bedpan Toileting- Clothing Manipulation and Hygiene: Maximal assistance  Vision         Perception     Praxis      Pertinent Vitals/Pain Pain Assessment Faces Pain Scale: Hurts even more Pain Location: "overall" Pain Descriptors / Indicators: Grimacing, Guarding, Moaning, Aching Pain Intervention(s): Limited activity within patient's tolerance, Repositioned     Hand Dominance     Extremity/Trunk Assessment Upper Extremity Assessment Upper Extremity Assessment: Generalized weakness   Lower Extremity  Assessment Lower Extremity Assessment: Generalized weakness       Communication Communication Communication: No difficulties   Cognition Arousal/Alertness: Awake/alert Behavior During Therapy: WFL for tasks assessed/performed Overall Cognitive Status: Within Functional Limits for tasks assessed                                       General Comments       Exercises Other Exercises Other Exercises: Educ/discussion re: DME, PoC, DC recs, falls prevention, home modifications   Shoulder Instructions      Home Living Family/patient expects to be discharged to:: Private residence Living Arrangements: Alone Available Help at Discharge: Family;Friend(s);Available 24 hours/day Type of Home: Mobile home Home Access: Ramped entrance     Home Layout: One level     Bathroom Shower/Tub: Walk-in shower         Home Equipment: Conservation officer, nature (2 wheels);Cane - single point;Transport chair;BSC/3in1;Shower seat;Wheelchair - manual          Prior Functioning/Environment Prior Level of Function : Needs assist         Mobility (physical): Bed mobility;Transfers;Gait ADLs (physical): Bathing;Dressing;Toileting Mobility Comments: physical assist for transfers for commode as well as WC          OT Problem List: Decreased strength;Decreased range of motion;Decreased activity tolerance;Impaired balance (sitting and/or standing);Pain      OT Treatment/Interventions: Self-care/ADL training;Therapeutic exercise;Patient/family education;Balance training;Energy conservation;Therapeutic activities;DME and/or AE instruction    OT Goals(Current goals can be found in the care plan section) Acute Rehab OT Goals Patient Stated Goal: to be comfortable and "get things in order" OT Goal Formulation: With patient Time For Goal Achievement: 03/29/22 Potential to Achieve Goals: Good ADL Goals Pt Will Transfer to Toilet: with supervision;ambulating Pt Will Perform Toileting -  Clothing Manipulation and hygiene: with supervision;sitting/lateral leans;sit to/from stand Pt/caregiver will Perform Home Exercise Program: Increased ROM;Increased strength (for increased strength, endurance, and for pain, anxiety, and stress mgmt, and comfort)  OT Frequency: Min 2X/week    Co-evaluation              AM-PAC OT "6 Clicks" Daily Activity     Outcome Measure Help from another person eating meals?: None Help from another person taking care of personal grooming?: A Little Help from another person toileting, which includes using toliet, bedpan, or urinal?: A Lot Help from another person bathing (including washing, rinsing, drying)?: A Lot Help from another person to put on and taking off regular upper body clothing?: A Lot Help from another person to put on and taking off regular lower body clothing?: A Lot 6 Click Score: 15   End of Session    Activity Tolerance: Patient tolerated treatment well Patient left: in bed;with call bell/phone within reach;with bed alarm set;with family/visitor present  OT Visit Diagnosis: Unsteadiness on feet (R26.81);Muscle weakness (generalized) (M62.81);Pain Pain - Right/Left: Left Pain - part of body: Hip                Time:  4144-3601 OT Time Calculation (min): 27 min Charges:  OT General Charges $OT Visit: 1 Visit OT Evaluation $OT Eval Moderate Complexity: 1 Mod OT Treatments $Self Care/Home Management : 23-37 mins Josiah Lobo, PhD, MS, OTR/L 03/15/22, 4:25 PM

## 2022-03-16 DIAGNOSIS — C7951 Secondary malignant neoplasm of bone: Secondary | ICD-10-CM | POA: Diagnosis not present

## 2022-03-16 DIAGNOSIS — S22000A Wedge compression fracture of unspecified thoracic vertebra, initial encounter for closed fracture: Secondary | ICD-10-CM | POA: Diagnosis not present

## 2022-03-16 DIAGNOSIS — I4891 Unspecified atrial fibrillation: Secondary | ICD-10-CM

## 2022-03-16 DIAGNOSIS — M8450XA Pathological fracture in neoplastic disease, unspecified site, initial encounter for fracture: Secondary | ICD-10-CM | POA: Diagnosis not present

## 2022-03-16 LAB — BASIC METABOLIC PANEL
Anion gap: 11 (ref 5–15)
BUN: 17 mg/dL (ref 8–23)
CO2: 22 mmol/L (ref 22–32)
Calcium: 7.3 mg/dL — ABNORMAL LOW (ref 8.9–10.3)
Chloride: 100 mmol/L (ref 98–111)
Creatinine, Ser: 0.93 mg/dL (ref 0.44–1.00)
GFR, Estimated: >60 mL/min (ref >60)
Glucose, Bld: 130 mg/dL — ABNORMAL HIGH (ref 70–99)
Potassium: 3.6 mmol/L (ref 3.5–5.1)
Sodium: 133 mmol/L — ABNORMAL LOW (ref 135–145)

## 2022-03-16 LAB — CBC
HCT: 33.4 % — ABNORMAL LOW (ref 36.0–46.0)
Hemoglobin: 11.4 g/dL — ABNORMAL LOW (ref 12.0–15.0)
MCH: 30 pg (ref 26.0–34.0)
MCHC: 34.1 g/dL (ref 30.0–36.0)
MCV: 87.9 fL (ref 80.0–100.0)
Platelets: 278 10*3/uL (ref 150–400)
RBC: 3.8 MIL/uL — ABNORMAL LOW (ref 3.87–5.11)
RDW: 17.7 % — ABNORMAL HIGH (ref 11.5–15.5)
WBC: 7.7 10*3/uL (ref 4.0–10.5)
nRBC: 0.9 % — ABNORMAL HIGH (ref 0.0–0.2)

## 2022-03-16 LAB — TYPE AND SCREEN
ABO/RH(D): O POS
Antibody Screen: NEGATIVE
Unit division: 0
Unit division: 0
Unit division: 0

## 2022-03-16 LAB — HEPARIN LEVEL (UNFRACTIONATED)
Heparin Unfractionated: 0.7 IU/mL (ref 0.30–0.70)
Heparin Unfractionated: 0.55 IU/mL (ref 0.30–0.70)
Heparin Unfractionated: >1.1 IU/mL — ABNORMAL HIGH (ref 0.30–0.70)
Heparin Unfractionated: 0.5 IU/mL (ref 0.30–0.70)

## 2022-03-16 LAB — BPAM RBC
Blood Product Expiration Date: 202307242359
Blood Product Expiration Date: 202308192359
Blood Product Expiration Date: 202308222359
ISSUE DATE / TIME: 202307191613
ISSUE DATE / TIME: 202307211134
ISSUE DATE / TIME: 202307211436
Unit Type and Rh: 5100
Unit Type and Rh: 5100
Unit Type and Rh: 5100

## 2022-03-16 MED ORDER — SODIUM CHLORIDE 0.9 % IV SOLN: INTRAVENOUS | Status: DC | PRN

## 2022-03-16 MED ORDER — DILTIAZEM HCL 30 MG PO TABS
60.0000 mg | ORAL_TABLET | Freq: Four times a day (QID) | ORAL | Status: DC
Start: 2022-03-16 — End: 2022-03-19
  Administered 2022-03-16 – 2022-03-19 (×12): 60 mg via ORAL
  Filled 2022-03-16 (×12): qty 2

## 2022-03-16 NOTE — Progress Notes (Signed)
Heparin rate/dose change from 7.5 to 7

## 2022-03-16 NOTE — Assessment & Plan Note (Signed)
Pressure Injury 03/13/22 Perineum Bilateral Stage 2 -  Partial thickness loss of dermis presenting as a shallow open injury with a red, pink wound bed without slough. (Active)  03/13/22 0130  Location: Perineum  Location Orientation: Bilateral  Staging: Stage 2 -  Partial thickness loss of dermis presenting as a shallow open injury with a red, pink wound bed without slough.  Wound Description (Comments):   Present on Admission: Yes    Stage II perineal ulcer, present on admission.  Wound care per nursing.

## 2022-03-16 NOTE — Progress Notes (Signed)
Physical Therapy Treatment Patient Details Name: Kathleen Blankenship MRN: [id number] DOB: 16-Aug-1951 Today's Date: 03/16/2022   History of Present Illness Pt is a 71 y.o. Female that presente to the ED for back pain, urinary retention. 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 Underwent L hemiarthroplasty 7/20. PMH of HTN, CAD, anxiety, cholecystectomy complicated by brief PEA cardiac arrest.    PT Comments    At beginning of session pt reported feeling "really tired" and declined OOB activity.  Pt on room air. Pt agreeable to LE there ex in bed.  Pt's SPO2 dropped to 85% with LE exercises even with cues for breathing/pursed lipped.  Notified RN and RN advised pt be put on 2LO2 during PT session.  Pt performed LE exercises well following instruction for technique with min cues.  AAROM required for LLE with heelslides and hip ABD/ADD. Current d/c plan is appropriate.  Recommendations for follow up therapy are one component of a multi-disciplinary discharge planning process, led by the attending physician.  Recommendations may be updated based on patient status, additional functional criteria and insurance authorization.  Follow Up Recommendations  Home health PT     Assistance Recommended at Discharge Frequent or constant Supervision/Assistance  Patient can return home with the following Two people to help with walking and/or transfers;A lot of help with bathing/dressing/bathroom;Assistance with feeding;Assist for transportation;Direct supervision/assist for financial management;Assistance with cooking/housework;Help with stairs or ramp for entrance;Direct supervision/assist for medications management   Equipment Recommendations  None recommended by PT    Recommendations for Other Services       Precautions / Restrictions  Precautions Precautions: Fall;Posterior Hip Precaution Booklet Issued: Yes (comment) Restrictions Weight Bearing Restrictions: Yes LLE Weight Bearing: Weight bearing as tolerated     Mobility  Bed Mobility               General bed mobility comments: Pt declined OOB therapy due to extreme fatigue.    Transfers                   General transfer comment: Pt declined OOB therapy due to extreme fatigue.    Ambulation/Gait               General Gait Details: deferred due to fatigue   Stairs             Wheelchair Mobility    Modified Rankin (Stroke Patients Only)       Balance       Sitting balance - Comments: Pt declined due to fatigue       Standing balance comment: Pt declined due to fatigue                            Cognition Arousal/Alertness: Awake/alert Behavior During Therapy: WFL for tasks assessed/performed Overall Cognitive Status: Within Functional Limits for tasks assessed                                          Exercises Total Joint Exercises Ankle Circles/Pumps: Strengthening, Both, 10 reps Quad Sets: Strengthening, Both, 10 reps Gluteal Sets: Strengthening, Both, 10 reps Heel Slides: Strengthening, Both, 10 reps, AAROM, Supine (AAROM with LLE) Hip ABduction/ADduction: AAROM, Strengthening, Both,  10 reps, Supine (ARROM LLE)    General Comments        Pertinent Vitals/Pain Pain Assessment Pain Assessment: Faces Faces Pain Scale: Hurts a little bit Pain Location: L hip Pain Descriptors / Indicators: Grimacing Pain Intervention(s): Limited activity within patient's tolerance    Home Living                          Prior Function            PT Goals (current goals can now be found in the care plan section) Acute Rehab PT Goals Patient Stated Goal: to go home PT Goal Formulation: With patient Time For Goal Achievement: 03/29/22 Potential to Achieve Goals:  Good Progress towards PT goals: Progressing toward goals    Frequency    7X/week      PT Plan Current plan remains appropriate    Co-evaluation              AM-PAC PT "6 Clicks" Mobility   Outcome Measure  Help needed turning from your back to your side while in a flat bed without using bedrails?: A Little Help needed moving from lying on your back to sitting on the side of a flat bed without using bedrails?: A Lot Help needed moving to and from a bed to a chair (including a wheelchair)?: A Lot Help needed standing up from a chair using your arms (e.g., wheelchair or bedside chair)?: A Lot Help needed to walk in hospital room?: A Lot Help needed climbing 3-5 steps with a railing? : Total 6 Click Score: 12    End of Session Equipment Utilized During Treatment: Oxygen Activity Tolerance: Patient limited by fatigue Patient left: in bed;with family/visitor present;with bed alarm set Nurse Communication: Mobility status PT Visit Diagnosis: Other abnormalities of gait and mobility (R26.89);Difficulty in walking, not elsewhere classified (R26.2);Muscle weakness (generalized) (M62.81);Pain Pain - Right/Left: Left Pain - part of body: Hip     Time: 1010-1040 PT Time Calculation (min) (ACUTE ONLY): 30 min  Charges:  $Therapeutic Exercise: 23-37 mins                     Bjorn Loser, PTA  03/16/22, 11:09 AM

## 2022-03-16 NOTE — Progress Notes (Signed)
Subjective:  POD #2 s/p left hip hemiarthroplasty.   Patient reports left hip pain as mild at rest.  Patient transferred to telemetry floor due to tachycardia.  Received Cardizem drip.  Objective:   VITALS:   Vitals:   03/16/22 0434 03/16/22 0500 03/16/22 0700 03/16/22 0800  BP: 109/79 91/64 (!) 96/58 95/68  Pulse: 94 94 91   Resp: '16 20 18   '$ Temp: 98.1 F (36.7 C) 98.3 F (36.8 C)    TempSrc: Oral Oral    SpO2: 98% 96% 97%   Weight:      Height:        PHYSICAL EXAM: Left lower extremity Neurovascular intact Sensation intact distally Intact pulses distally Dorsiflexion/Plantar flexion intact Incision: scant drainage No cellulitis present Compartment soft  LABS  Results for orders placed or performed during the hospital encounter of 03/12/22 (from the past 24 hour(s))  Prepare RBC (crossmatch)     Status: None   Collection Time: 03/15/22  9:10 AM  Result Value Ref Range   Order Confirmation      ORDER PROCESSED BY BLOOD BANK Performed at [facility], [address] [place], [place] [address]   Hemoglobin and hematocrit, blood     Status: None   Collection Time: 03/15/22  6:00 PM  Result Value Ref Range   Hemoglobin 12.2 12.0 - 15.0 g/dL   HCT 36.1 36.0 - 46.0 %  Troponin I (High Sensitivity)     Status: Abnormal   Collection Time: 03/15/22  6:00 PM  Result Value Ref Range   Troponin I (High Sensitivity) 19 (H) <18 ng/L  TSH     Status: None   Collection Time: 03/15/22  6:00 PM  Result Value Ref Range   TSH 4.283 0.350 - 4.500 uIU/mL  APTT     Status: Abnormal   Collection Time: 03/15/22  8:20 PM  Result Value Ref Range   aPTT 37 (H) 24 - 36 seconds  Protime-INR     Status: None   Collection Time: 03/15/22  8:20 PM  Result Value Ref Range   Prothrombin Time 13.4 11.4 - 15.2 seconds   INR 1.0 0.8 - 1.2  Troponin I (High Sensitivity)     Status: Abnormal   Collection Time: 03/15/22  8:20 PM  Result Value Ref Range   Troponin I (High  Sensitivity) 23 (H) <18 ng/L  CBC     Status: Abnormal   Collection Time: 03/16/22  3:30 AM  Result Value Ref Range   WBC 7.7 4.0 - 10.5 K/uL   RBC 3.80 (L) 3.87 - 5.11 MIL/uL   Hemoglobin 11.4 (L) 12.0 - 15.0 g/dL   HCT 33.4 (L) 36.0 - 46.0 %   MCV 87.9 80.0 - 100.0 fL   MCH 30.0 26.0 - 34.0 pg   MCHC 34.1 30.0 - 36.0 g/dL   RDW 17.7 (H) 11.5 - 15.5 %   Platelets 278 150 - 400 K/uL   nRBC 0.9 (H) 0.0 - 0.2 %  Basic metabolic panel     Status: Abnormal   Collection Time: 03/16/22  3:30 AM  Result Value Ref Range   Sodium 133 (L) 135 - 145 mmol/L   Potassium 3.6 3.5 - 5.1 mmol/L   Chloride 100 98 - 111 mmol/L   CO2 22 22 - 32 mmol/L   Glucose, Bld 130 (H) 70 - 99 mg/dL   BUN 17 8 - 23 mg/dL   Creatinine, Ser 0.93 0.44 - 1.00 mg/dL   Calcium 7.3 (  L) 8.9 - 10.3 mg/dL   GFR, Estimated >60 >60 mL/min   Anion gap 11 5 - 15  Heparin level (unfractionated)     Status: None   Collection Time: 03/16/22  3:30 AM  Result Value Ref Range   Heparin Unfractionated 0.70 0.30 - 0.70 IU/mL    DG Hip Port Unilat With Pelvis 1V Left  Result Date: 03/14/2022 CLINICAL DATA:  Status post left hip replacement EXAM: DG HIP (WITH OR WITHOUT PELVIS) 1V PORT LEFT COMPARISON:  None Available. FINDINGS: Interval postsurgical changes from left total hip arthroplasty for left femoral neck fracture. Arthroplasty components appear in their expected alignment. Numerous scattered lytic lesions compatible with known diffuse osseous metastatic disease. Expected postoperative changes within the overlying soft tissues. IMPRESSION: Postsurgical changes from left total hip arthroplasty. Electronically Signed   By: Yetta Glassman M.D.   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  Hemoglobin of 11.4 this morning after 2 units of PRBCs yesterday. Continue physical and occupational therapy as medically appropriate given recent tachycardia.  Heart rate currently 91.  Last blood pressure was 96/58.     Thornton Park , MD 03/16/2022, 8:42 AM

## 2022-03-16 NOTE — Assessment & Plan Note (Addendum)
Started 7/21 evening.  Initially started on IV Cardizem.  Changed over to p.o. Cardizem.  On heparin drip.  Cardiology following.

## 2022-03-16 NOTE — Progress Notes (Signed)
Patient has been a YELLOW MEWS for the duration of this shift due to a high pulse and heart rate. Patient is currently a GREEN MEWS and vitals are now stable.   03/15/22 2310  Vitals  Temp 97.7 F (36.5 C)  Temp Source Oral  BP 108/72  MAP (mmHg) 84  BP Location Right Leg  BP Method Automatic  Patient Position (if appropriate) Lying  Pulse Rate (!) 104  Pulse Rate Source Monitor  ECG Heart Rate (!) 105  Resp 20  MEWS COLOR  MEWS Score Color Green  Oxygen Therapy  SpO2 100 %  O2 Device Room Air  MEWS Score  MEWS Temp 0  MEWS Systolic 0  MEWS Pulse 1  MEWS RR 0  MEWS LOC 0  MEWS Score 1

## 2022-03-16 NOTE — Plan of Care (Signed)
  Problem: Education: Goal: Knowledge of General Education information will improve Description: Including pain rating scale, medication(s)/side effects and non-pharmacologic comfort measures 03/16/2022 0608 by Kosha Jaquith, RN Outcome: Progressing 03/16/2022 0608 by Staley Lunz, RN Outcome: Progressing   Problem: Health Behavior/Discharge Planning: Goal: Ability to manage health-related needs will improve 03/16/2022 0608 by Shelvy Heckert, RN Outcome: Progressing 03/16/2022 0608 by Nathanel Tallman, RN Outcome: Progressing   Problem: Clinical Measurements: Goal: Ability to maintain clinical measurements within normal limits will improve 03/16/2022 0608 by Alfons Sulkowski, RN Outcome: Progressing 03/16/2022 0608 by Elnoria Livingston, RN Outcome: Progressing Goal: Will remain free from infection 03/16/2022 0608 by Da Michelle, RN Outcome: Progressing 03/16/2022 0608 by Kearah Gayden, RN Outcome: Progressing Goal: Diagnostic test results will improve 03/16/2022 0608 by Tsutomu Barfoot, RN Outcome: Progressing 03/16/2022 0608 by Treysen Sudbeck, RN Outcome: Progressing Goal: Respiratory complications will improve 03/16/2022 0608 by Nizhoni Parlow, RN Outcome: Progressing 03/16/2022 0608 by Dona Walby, RN Outcome: Progressing Goal: Cardiovascular complication will be avoided 03/16/2022 0608 by Jarret Torre, RN Outcome: Progressing 03/16/2022 0608 by Yanice Maqueda, RN Outcome: Progressing   Problem: Clinical Measurements: Goal: Diagnostic test results will improve 03/16/2022 0608 by Tashawn Greff, RN Outcome: Progressing 03/16/2022 0608 by Betzabeth Derringer, RN Outcome: Progressing   Problem: Activity: Goal: Risk for activity intolerance will decrease 03/16/2022 0608 by Sebastian Lurz, RN Outcome: Progressing 03/16/2022 0608 by Izeyah Deike, RN Outcome: Progressing   Problem: Nutrition: Goal: Adequate nutrition will be maintained 03/16/2022 0608 by Marrie Chandra, RN Outcome:  Progressing 03/16/2022 0608 by Linell Shawn, RN Outcome: Progressing   Problem: Elimination: Goal: Will not experience complications related to bowel motility 03/16/2022 0608 by Sentoria Brent, RN Outcome: Progressing 03/16/2022 0608 by Onofre Gains, RN Outcome: Progressing Goal: Will not experience complications related to urinary retention 03/16/2022 0608 by Geetika Laborde, RN Outcome: Progressing 03/16/2022 0608 by Mckenzee Beem, RN Outcome: Progressing   Problem: Pain Managment: Goal: General experience of comfort will improve 03/16/2022 0608 by Britney Newstrom, RN Outcome: Progressing 03/16/2022 0608 by Monifah Freehling, RN Outcome: Progressing

## 2022-03-16 NOTE — Progress Notes (Signed)
[place] for heparin infusion Indication: atrial fibrillation  Allergies  Allergen Reactions   Codeine Hives   Methotrexate Nausea Only   Remicade [Infliximab] Other (See Comments)    Chest felt heavy, was given benadryl.    Patient Measurements: Height: '5\' 1"'$  (154.9 cm) Weight: 51 kg (112 lb 7 oz) IBW/kg (Calculated) : 47.8 Heparin Dosing Weight: 51 kg  Vital Signs: Temp: 97.9 F (36.6 C) (07/22 1144) Temp Source: Oral (07/22 0500) BP: 121/87 (07/22 1144) Pulse Rate: 88 (07/22 1144)  Labs: Recent Labs    03/14/22 0555 03/14/22 0915 03/15/22 0445 03/15/22 1800 03/15/22 2020 03/16/22 0330  HGB 9.6*  --  7.3* 12.2  --  11.4*  HCT 28.7*  --  22.2* 36.1  --  33.4*  PLT 396  --  289  --   --  278  APTT  --   --   --   --  37*  --   LABPROT  --   --   --   --  13.4  --   INR  --   --   --   --  1.0  --   HEPARINUNFRC  --   --   --   --   --  0.70  CREATININE  --  0.58 0.52  --   --  0.93  TROPONINIHS  --   --   --  19* 23*  --    Heparin Dosing Weight: 51 kg  Estimated Creatinine Clearance: 43.1 mL/min (by C-G formula based on SCr of 0.93 mg/dL).  Medical History: Past Medical History:  Diagnosis Date   Anginal pain ([place])    Anxiety    Arthritis    RA   Cancer ([place])    Complication of anesthesia 12/06/2021   PEA Cardiac Arrest with ROSC after 1 round of chest compressions and 1 mg epinephrine. Infiltrated IV found after rescusitative attempts were made.   Coronary artery disease    GERD (gastroesophageal reflux disease)    Hypertension    Pneumonia    Medications: Pt initially ordered Enoxaparin 40 mg and given 1 dose 7/21 @ 0821; then transitioned to heparin gtt.  Assessment: Pt is a 71 yo female admitted 03/12/22 that developed AFib, confirmed by EKG, this evening.  7/22 0330 HL=0.70  therapeutic; but upper limit- adjust from 750 units/hr to 700 units/hr 7/22 1100 HL=0.55 therapeutic; 700 un/hr  Goal of Therapy:   Heparin level 0.3-0.7 units/ml Monitor platelets by anticoagulation protocol: Yes   Plan:  7/22 1100 HL=0.55  therapeutic x1 after rate change. No boluses per MD, "closely watch Hgb". Last transfused 2u pRBC evening 7/21. [Hgb 7.3>12.2>11.4] Continue heparin infusion at 700 units/hr Will check confirmatory HL in 6 hr; then daily once consecutively therapuetic CBC daily while on heparin  Lorna Dibble, PharmD, [place] Pharmacist 03/16/2022 11:56 AM

## 2022-03-16 NOTE — Progress Notes (Signed)
[place] for heparin infusion Indication: atrial fibrillation  Allergies  Allergen Reactions   Codeine Hives   Methotrexate Nausea Only   Remicade [Infliximab] Other (See Comments)    Chest felt heavy, was given benadryl.    Patient Measurements: Height: '5\' 1"'$  (154.9 cm) Weight: 51 kg (112 lb 7 oz) IBW/kg (Calculated) : 47.8 Heparin Dosing Weight: 51 kg  Vital Signs: Temp: 98.4 F (36.9 C) (07/22 2010) BP: 138/118 (07/22 1517) Pulse Rate: 86 (07/22 1517)  Labs: Recent Labs    03/14/22 0555 03/14/22 0915 03/15/22 0445 03/15/22 1800 03/15/22 2020 03/16/22 0330 03/16/22 0330 03/16/22 1100 03/16/22 1700 03/16/22 1950  HGB 9.6*  --  7.3* 12.2  --  11.4*  --   --   --   --   HCT 28.7*  --  22.2* 36.1  --  33.4*  --   --   --   --   PLT 396  --  289  --   --  278  --   --   --   --   APTT  --   --   --   --  37*  --   --   --   --   --   LABPROT  --   --   --   --  13.4  --   --   --   --   --   INR  --   --   --   --  1.0  --   --   --   --   --   HEPARINUNFRC  --   --   --   --   --  0.70   < > 0.55 >1.10* 0.50  CREATININE  --  0.58 0.52  --   --  0.93  --   --   --   --   TROPONINIHS  --   --   --  19* 23*  --   --   --   --   --    < > = values in this interval not displayed.   Heparin Dosing Weight: 51 kg  Estimated Creatinine Clearance: 43.1 mL/min (by C-G formula based on SCr of 0.93 mg/dL).  Medical History: Past Medical History:  Diagnosis Date   Anginal pain ([place])    Anxiety    Arthritis    RA   Cancer ([place])    Complication of anesthesia 12/06/2021   PEA Cardiac Arrest with ROSC after 1 round of chest compressions and 1 mg epinephrine. Infiltrated IV found after rescusitative attempts were made.   Coronary artery disease    GERD (gastroesophageal reflux disease)    Hypertension    Pneumonia    Medications: Pt initially ordered Enoxaparin 40 mg and given 1 dose 7/21 @ 0821; then transitioned to heparin  gtt.  Assessment: Pt is a 71 yo female admitted 03/12/22 that developed AFib, confirmed by EKG, this evening.  7/22 1100 HL=0.55 therapeutic; 700 un/hr  Goal of Therapy:  Heparin level 0.3-0.7 units/ml Monitor platelets by anticoagulation protocol: Yes    Date/time HL/aPTT Comments 7/22'@0330'$  HL 0.70 Therapeutic, adj 750 un/hr to 700 un/hr 7/22'@1100'$  HL 0.55 Therapeutic @ 700 un/hr  7/22'@1700'$  HL > 1.10 In erro , repeat lab 7/22'@1950'$  HL 0.50 Therapeutic x 2, continue at 700 un/hr  Plan:  No boluses per MD, "closely watch Hgb". Last transfused 2u pRBC evening 7/21. [Hgb 7.3>12.2>11.4] Continue heparin infusion at  700 units/hr Re-check HL with AM labs CBC daily while on heparin  Cage Gupton Rodriguez-Guzman PharmD, BCPS 03/16/2022 9:02 PM

## 2022-03-16 NOTE — Progress Notes (Signed)
[place] for heparin infusion Indication: atrial fibrillation  Allergies  Allergen Reactions   Codeine Hives   Methotrexate Nausea Only   Remicade [Infliximab] Other (See Comments)    Chest felt heavy, was given benadryl.    Patient Measurements: Height: '5\' 1"'$  (154.9 cm) Weight: 51 kg (112 lb 7 oz) IBW/kg (Calculated) : 47.8 Heparin Dosing Weight: 51 kg  Vital Signs: Temp: 98.1 F (36.7 C) (07/22 0434) Temp Source: Oral (07/22 0434) BP: 109/79 (07/22 0434) Pulse Rate: 94 (07/22 0434)  Labs: Recent Labs    03/14/22 0555 03/14/22 0915 03/15/22 0445 03/15/22 1800 03/15/22 2020 03/16/22 0330  HGB 9.6*  --  7.3* 12.2  --  11.4*  HCT 28.7*  --  22.2* 36.1  --  33.4*  PLT 396  --  289  --   --  278  APTT  --   --   --   --  37*  --   LABPROT  --   --   --   --  13.4  --   INR  --   --   --   --  1.0  --   HEPARINUNFRC  --   --   --   --   --  0.70  CREATININE  --  0.58 0.52  --   --   --   TROPONINIHS  --   --   --  19* 23*  --      Estimated Creatinine Clearance: 50.1 mL/min (by C-G formula based on SCr of 0.52 mg/dL).   Medical History: Past Medical History:  Diagnosis Date   Anginal pain ([place])    Anxiety    Arthritis    RA   Cancer ([place])    Complication of anesthesia 12/06/2021   PEA Cardiac Arrest with ROSC after 1 round of chest compressions and 1 mg epinephrine. Infiltrated IV found after rescusitative attempts were made.   Coronary artery disease    GERD (gastroesophageal reflux disease)    Hypertension    Pneumonia    Medications: Pt initially ordered Enoxaparin 40 mg and given 1 dose 7/21 @ 0821.  Assessment: Pt is a 71 yo female admitted 03/12/22 that developing A fib, confirmed by EKG, this evening.  7/22 0330 HL=0.70  therapeutic but upper limit- adjust from 750 units/hr to 700 units/hr  Goal of Therapy:  Heparin level 0.3-0.7 units/ml Monitor platelets by anticoagulation protocol: Yes   Plan:   7/22 0330 HL=0.70  therapeutic but upper limit- No boluses per MD, "closely watch Hgb" Slightly Decrease heparin infusion to 700 units/hr Will check confirmatory HL in 6 hr after rate change CBC daily while on heparin  Chinita Greenland PharmD Clinical Pharmacist 03/16/2022

## 2022-03-16 NOTE — Progress Notes (Signed)
[facility] Cardiology    SUBJECTIVE: Patient resting comfortably in bed denies any pain denies any palpitations tachycardia heart rate is back to normal at around 80 patient had a significant bout of rapid atrial fibrillation yesterday but is since resolved she is currently on diltiazem drip which we can transition to p.o. denies any chest pain has not ambulated today may still need physical therapy   Vitals:   03/16/22 0500 03/16/22 0700 03/16/22 0800 03/16/22 1144  BP: 91/64 (!) 96/58 95/68 121/87  Pulse: 94 91  88  Resp: '20 18  16  '$ Temp: 98.3 F (36.8 C)   97.9 F (36.6 C)  TempSrc: Oral     SpO2: 96% 97%  100%  Weight:      Height:         Intake/Output Summary (Last 24 hours) at 03/16/2022 1244 Last data filed at 03/16/2022 5809 Gross per 24 hour  Intake 901.25 ml  Output 300 ml  Net 601.25 ml      PHYSICAL EXAM  General: Well developed, well nourished, in no acute distress HEENT:  Normocephalic and atramatic Neck:  No JVD.  Lungs: Clear bilaterally to auscultation and percussion. Heart: HRRR . Normal S1 and S2 without gallops or murmurs.  Abdomen: Bowel sounds are positive, abdomen soft and non-tender  Msk:  Back normal, normal gait. Normal strength and tone for age. Extremities: No clubbing, cyanosis or edema.   Neuro: Alert and oriented X 3. Psych:  Good affect, responds appropriately   LABS: Basic Metabolic Panel: Recent Labs    03/14/22 0555 03/14/22 0915 03/15/22 0445 03/16/22 0330  NA  --    < > 131* 133*  K  --    < > 5.4* 3.6  CL  --    < > 105 100  CO2  --    < > 23 22  GLUCOSE  --    < > 146* 130*  BUN  --    < > 11 17  CREATININE  --    < > 0.52 0.93  CALCIUM  --    < > 6.6* 7.3*  MG 1.9  --   --   --   PHOS 2.9  --   --   --    < > = values in this interval not displayed.   Liver Function Tests: Recent Labs    03/14/22 0915 03/15/22 0445  AST 38 38  ALT 21 16  ALKPHOS 149* 119  BILITOT 0.8 0.7  PROT 5.1* 4.1*  ALBUMIN 2.2* 1.9*    No results for input(s): "LIPASE", "AMYLASE" in the last 72 hours. CBC: Recent Labs    03/14/22 0555 03/15/22 0445 03/15/22 1800 03/16/22 0330  WBC 5.3 8.3  --  7.7  NEUTROABS 3.7 6.5  --   --   HGB 9.6* 7.3* 12.2 11.4*  HCT 28.7* 22.2* 36.1 33.4*  MCV 88.6 91.0  --  87.9  PLT 396 289  --  278   Cardiac Enzymes: No results for input(s): "CKTOTAL", "CKMB", "CKMBINDEX", "TROPONINI" in the last 72 hours. BNP: Invalid input(s): "POCBNP" D-Dimer: No results for input(s): "DDIMER" in the last 72 hours. Hemoglobin A1C: No results for input(s): "HGBA1C" in the last 72 hours. Fasting Lipid Panel: No results for input(s): "CHOL", "HDL", "LDLCALC", "TRIG", "CHOLHDL", "LDLDIRECT" in the last 72 hours. Thyroid Function Tests: Recent Labs    03/15/22 1800  TSH 4.283   Anemia Panel: No results for input(s): "VITAMINB12", "FOLATE", "FERRITIN", "TIBC", "IRON", "RETICCTPCT" in  the last 72 hours.  DG Hip Port Unilat With Pelvis 1V Left  Result Date: 03/14/2022 CLINICAL DATA:  Status post left hip replacement EXAM: DG HIP (WITH OR WITHOUT PELVIS) 1V PORT LEFT COMPARISON:  None Available. FINDINGS: Interval postsurgical changes from left total hip arthroplasty for left femoral neck fracture. Arthroplasty components appear in their expected alignment. Numerous scattered lytic lesions compatible with known diffuse osseous metastatic disease. Expected postoperative changes within the overlying soft tissues. IMPRESSION: Postsurgical changes from left total hip arthroplasty. Electronically Signed   By: Yetta Glassman M.D.   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 Active Problems:    Rheumatoid arthritis ([place])   Choledocholithiasis   Dyslipidemia   Essential hypertension   Anxiety and depression   GERD (gastroesophageal reflux disease)   Pleural effusion on right   Pressure injury of skin   PNA (pneumonia)   Protein-calorie malnutrition, severe   Metastatic cancer to bone (HCC)   Anemia   UTI (urinary tract infection)   Compression fracture of body of thoracic vertebra ([place])   Patient had a bout of atrial fibrillation rapid ventricular sponsor yesterday responded to IV diltiazem load and drip rate about sinus rhythm 85 so we have decided to switch to p.o. 60 every 6 in anticipation of hopefully have the patient discharged on of IV therapy  PMH of hypertension, rheumatoid arthritis, post-operative lap chole PEA arrest 11/2021, insignificant CAD by heart cath 2018, pericarditis, ongoing tobacco use who presented to [facility] ED 03/12/2022 who presents with progressively worsening back and left hip pain for several weeks with significant difficulty ambulating.  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  CT chest abdomen pelvis performed showed a displaced left femoral neck fracture which may require surgical management.  Cardiology is consulted for medical optimization prior to possible orthopedic surgery.   #Medical optimization prior to potential orthopedic surgery #History of postoperative PEA arrest 11/2021  #Pathologic left femoral neck fracture She presents with a few weeks of progressive functional decline due to pain in her back and left leg resulting in her inability to ambulate.  MRI and CT imaging unfortunately show diffuse osseous metastasis and likely pathologic left femoral neck fracture.  The circumstances surrounding her PEA arrest appear to be in the setting of hypotension post lap chole with difficult IV  access so vasopressors cannot be started, subsequently she lost pulses and underwent 1 round of CPR and 2 minutes of ACLS care before achieving ROSC.  She presented as a possible STEMI and had an emergent heart cath in 2018 that showed insignificant coronary disease and was treated for pericarditis.  Her most recent echo from April shows normal LVEF she is not currently experiencing any heart failure symptoms at this time, although she is decreased ability to perform her ADLs due to pain and difficulty ambulating from her fracture.  Her EKG is without acute ischemic changes, showing sinus tachycardia there is no telemetry available for review thus far.   -Recommend continuous telemetry monitoring -Continue beta-blocker pre-, peri-, postoperatively -maintain IV access via 2 large-bore IVs  -We  will obtain an echo today to evaluate her LVEF, otherwise no further cardiac diagnostics are recommended  Patient status post hip surgery resting comfortably in bed denies any pain no fever no chills and heart rate improved does not did not notice any palpitations or tachycardia when she had rapid A-fib       Yolonda Kida, MD,  03/16/2022 12:44 PM

## 2022-03-16 NOTE — Progress Notes (Signed)
Patient's labs collected

## 2022-03-16 NOTE — Progress Notes (Signed)
Patient's Systolic is 875 and HR 91. Titrated down from 3m/hr to 12.5 ml/hr See MAR for rate/dose change. Patient stable. Vitals below  03/16/22 0434  Vitals  Temp 98.1 F (36.7 C)  Temp Source Oral  BP 109/79  MAP (mmHg) 87  BP Location Right Leg  BP Method Automatic  Patient Position (if appropriate) Lying  Pulse Rate 94  Pulse Rate Source Monitor  ECG Heart Rate 91  Resp 16  Level of Consciousness  Level of Consciousness Alert  MEWS COLOR  MEWS Score Color Green  Oxygen Therapy  SpO2 98 %  O2 Device Room Air  O2 Flow Rate (L/min) 2 L/min  Patient Activity (if Appropriate) In bed  Pulse Oximetry Type Continuous  MEWS Score  MEWS Temp 0  MEWS Systolic 0  MEWS Pulse 0  MEWS RR 0  MEWS LOC 0  MEWS Score 0

## 2022-03-16 NOTE — Progress Notes (Signed)
[facility] Progress Note  Patient: Kathleen Blankenship    JIR:[id number]  DOA: 03/12/2022    Date of Service: the patient was seen and examined on 03/16/2022  Brief hospital course: 71 year old female with history of CAD, hypertension, PEA cardiac arrest postoperatively after lap chole on 11/2011, and ongoing tobacco use presented to the emergency department on 7/18 with progressively worsening back and left hip pain for several weeks causing difficulty to ambulate.  MRI done revealed extensive osseous metastatic disease with CT lumbar spine noting pathologic fractures at T4 and T10.  Also found to have a displaced left femoral neck pathologic fracture.  Lab work noteworthy for sodium of 130, albumin of 2.5.  Orthopedics and oncology consulted.  Course complicated by rapid atrial fibrillation that started on evening of 7/21.  Patient placed on a Cardizem drip.  Assessment and Plan: Assessment and Plan: * 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  Seen by orthopedic surgery and underwent a left hemiarthroplasty on 7/20.  Surgical pathology pending.  Seen by PT and OT who are recommending home health.  Metastatic cancer to bone [facility]) Oncology following, suspicious for likely primary from breast.  Pathology from left femur fracture repair pending. -Breast tumor markers (CA 27-29, CEA 15-3) ordered per oncology noted to be elevated.  Palliative care consulted.  Patient has established as a DNR.  For now, primary focus is on pain control.  Atrial fibrillation with RVR ([place]) Started 7/21 evening.  Initially started on IV Cardizem.  Changed over to p.o. Cardizem.  On heparin drip.  Cardiology following.  PNA (pneumonia) - Patient with history of rheumatoid arthritis on Rinvoq and leflunomide with concerns for metastatic Pathologic Fracture. -CT chest with small layering right pleural effusion, right lower lobe airspace disease greater than typically  seen with compressive atelectasis may be secondary to pneumonia however aspiration is considered given layering debris in the right mainstem bronchus.  No obvious pulmonary mass. -Patient also with moderate hiatal hernia with dilated esophagus and intraluminal debris to the level of the thoracic inlet noted. -Urine strep pneumococcus antigen negative.  Urine Legionella antigen negative -Continue IV Zosyn, Mucinex.    Pleural effusion on right - This could be associated with right lower lobe airspace disease/pneumonia. - She does have cough however without fever or leukocytosis and she is a chronic smoker. -Patient with concern for metastatic disease, pathologic fracture, recent weight loss, noted to be on [place], [place] and likely immunocompromised and as such patient placed empirically on IV Zosyn for probable pneumonia.   -Continue to hold [place].  -Supportive care  UTI (urinary tract infection) - Urinalysis concerning for UTI with moderate leukocytes, positive nitrite, many bacteria.  Urine cultures with multiple species.  Currently on IV Zosyn.  Compression fracture of body of thoracic vertebra (HCC) - Noted at T4, T10 likely pathologic in nature versus secondary to osteoporosis. -IR consulted for evaluation for kyphoplasty.  Protein-calorie malnutrition, severe Nutrition Status: Nutrition Problem: Severe Malnutrition Etiology: chronic illness (metastatic bone disease) Signs/Symptoms: moderate fat depletion, severe fat depletion, moderate muscle depletion, severe muscle depletion Interventions: MVI, Carn[place] Instant Breakfast     Pressure injury of skin Pressure Injury 03/13/22 Perineum Bilateral Stage 2 -  Partial thickness loss of dermis presenting as a shallow open injury with a red, pink wound bed without slough. (Active)  03/13/22 0130  Location: Perineum  Location Orientation: Bilateral  Staging: Stage 2 -  Partial thickness loss of dermis presenting as a shallow open  injury with a red, pink wound bed without slough.  Wound Description (Comments):   Present on Admission: Yes    Stage II perineal ulcer, present on admission.  Wound care per nursing.  Anemia No signs of bleeding.  Consistent with anemia of chronic disease.  Received 1 unit packed red blood cell transfusion 7/20 and then 2 more units given prior to surgery after hemoglobin dropped to 7.3.  Postsurgery, no significant drop.  Hemoglobin at 11.4.  Continue to follow.  Essential hypertension -Continue home regimen Toprol-XL, Cozaar.  Dyslipidemia - Continue statin.  Rheumatoid arthritis ([place]) - The patient is on Rinvoq and leflunomide. -Leflunomide held, Outpatient follow-up.  GERD (gastroesophageal reflux disease) Continue PPI therapy.  Anxiety and depression As needed Ativan.  Continue home Zoloft.  Choledocholithiasis Incidental finding.  Asymptomatic.       Body mass index is 21.24 kg/m.  Nutrition Problem: Severe Malnutrition Etiology: chronic illness (metastatic bone disease) Pressure Injury 03/13/22 Perineum Bilateral Stage 2 -  Partial thickness loss of dermis presenting as a shallow open injury with a red, pink wound bed without slough. (Active)  03/13/22 0130  Location: Perineum  Location Orientation: Bilateral  Staging: Stage 2 -  Partial thickness loss of dermis presenting as a shallow open injury with a red, pink wound bed without slough.  Wound Description (Comments):   Present on Admission: Yes  Dressing Type Foam - Lift dressing to assess site every shift 03/15/22 0100     Consultants: Oncology Cardiology for preop clearance Interventional radiology Palliative care Orthopedic surgery  Procedures: Status post left hip hemiarthroplasty 7/20 Transfusion packed red blood cells x3  Antimicrobials: IV Zosyn 7/18 - present  Code Status: DNR   Subjective: Feels okay, little tired.  Denies any shortness of breath or chest pain  Objective: Vital  signs were reviewed and unremarkable. Vitals:   03/16/22 1356 03/16/22 1517  BP:  (!) 138/118  Pulse:  86  Resp:  19  Temp:  97.9 F (36.6 C)  SpO2: 98% 90%    Intake/Output Summary (Last 24 hours) at 03/16/2022 1741 Last data filed at 03/16/2022 1513 Gross per 24 hour  Intake 305.02 ml  Output 0 ml  Net 305.02 ml   Filed Weights   03/12/22 2027 03/14/22 0957  Weight: 51 kg 51 kg   Body mass index is 21.24 kg/m.  Exam:  General: Alert and oriented x3, no acute distress, fatigued HEENT: Normocephalic and atraumatic, mucous membranes are moist Cardiovascular: Irregular rhythm, rate controlled Respiratory: Decreased breath sounds on right side from midway down Abdomen: Soft, nontender, nondistended, positive bowel sounds Musculoskeletal: No clubbing or cyanosis, trace pitting edema Skin: No skin breaks, tears or lesions other than site of surgery Psychiatry: Appropriate, no evidence of psychoses Neurology: No focal deficits  Data Reviewed: Hemoglobin at 11.4.  Disposition:  Status is: Inpatient Remains inpatient appropriate because: Pain control.  Adjustment of medications for atrial fibrillation.  Pathology from surgery    Anticipated discharge date: 7/25  Family Communication: Left message for sister DVT Prophylaxis: SCDs Start: 03/14/22 1512 Place TED hose Start: 03/14/22 1512   Heparin drip   Author: Annita Brod ,MD 03/16/2022 5:41 PM  To reach On-call, see care teams to locate the attending and reach out via [email or web address]. Between 7PM-7AM, please contact night-coverage If you still have difficulty reaching the attending provider, please page the [facility] (Director on Call) for [facility] on amion for assistance.

## 2022-03-16 NOTE — Progress Notes (Signed)
Patient is on room air and O2 dropped to 82. Did not sustain initially. Oxygen dropped a second and third time to 80 and it sustained. Sat patient up in bed and placed them on 2L O2 [place]

## 2022-03-16 NOTE — Progress Notes (Signed)
Patient arrives floor at 19:30 pm. Alert and Oriented.HR in the 160s.  Cardizem drip started at 5 mg/hr and titrated up. Heparin continuous at 7.5 ml/hr started. Patient's entire family present. Sister Quintin Alto ) is POA. Contact: Home: [phone] and Cell: [phone]  Password given for visitors is "Deke".   Skin assessment shows surgical incision with Honeycomb dressing in Left hip. Right Upper extremity Lymphedema and LLE swelling below the surgical incision.  Patient has pneumonia being treated with Zosyn. Tramadol scheduled for pain from surgery, and tentative kyphoplasty procedure to address T 4 to T 7 Spinal fracture might be scheduled for Monday July 24 th. Not scheduled as yet.  Patient has a double lumen line and 20G Peripheral IV in Left forearm. Labs are Nurse draws on the unit.

## 2022-03-16 NOTE — Progress Notes (Signed)
Patient's family has requested that this password be used by her visitors. If the visitor does not know what the password is, they are not to see her or have any kind of access, and patient's sister Kieth Brightly) should be called immediately. Sister's phone number is in patient's chart.

## 2022-03-16 NOTE — Progress Notes (Signed)
Patient's blood pressure is down to 91/64 and HR is 93. Cardizem drip titrated down to 10 mg/hr  03/16/22 0500  Vitals  Temp 98.3 F (36.8 C)  Temp Source Oral  BP 91/64  MAP (mmHg) 69  BP Location Right Leg  BP Method Automatic  Patient Position (if appropriate) Lying  Pulse Rate 94  Pulse Rate Source Monitor  ECG Heart Rate 94  Resp 20  Level of Consciousness  Level of Consciousness Alert  MEWS COLOR  MEWS Score Color Green  Oxygen Therapy  SpO2 96 %  O2 Device Nasal Cannula  O2 Flow Rate (L/min) 2 L/min  Patient Activity (if Appropriate) In bed  Pulse Oximetry Type Continuous  MEWS Score  MEWS Temp 0  MEWS Systolic 1  MEWS Pulse 0  MEWS RR 0  MEWS LOC 0  MEWS Score 1

## 2022-03-17 DIAGNOSIS — C787 Secondary malignant neoplasm of liver and intrahepatic bile duct: Secondary | ICD-10-CM | POA: Diagnosis not present

## 2022-03-17 DIAGNOSIS — N179 Acute kidney failure, unspecified: Secondary | ICD-10-CM | POA: Diagnosis not present

## 2022-03-17 DIAGNOSIS — C801 Malignant (primary) neoplasm, unspecified: Secondary | ICD-10-CM | POA: Diagnosis not present

## 2022-03-17 DIAGNOSIS — C7951 Secondary malignant neoplasm of bone: Secondary | ICD-10-CM | POA: Diagnosis not present

## 2022-03-17 DIAGNOSIS — M8450XA Pathological fracture in neoplastic disease, unspecified site, initial encounter for fracture: Secondary | ICD-10-CM | POA: Diagnosis not present

## 2022-03-17 DIAGNOSIS — I5032 Chronic diastolic (congestive) heart failure: Secondary | ICD-10-CM

## 2022-03-17 DIAGNOSIS — I4891 Unspecified atrial fibrillation: Secondary | ICD-10-CM | POA: Diagnosis not present

## 2022-03-17 LAB — CBC
HCT: 32.4 % — ABNORMAL LOW (ref 36.0–46.0)
Hemoglobin: 11 g/dL — ABNORMAL LOW (ref 12.0–15.0)
MCH: 30.1 pg (ref 26.0–34.0)
MCHC: 34 g/dL (ref 30.0–36.0)
MCV: 88.5 fL (ref 80.0–100.0)
Platelets: 243 10*3/uL (ref 150–400)
RBC: 3.66 MIL/uL — ABNORMAL LOW (ref 3.87–5.11)
RDW: 18 % — ABNORMAL HIGH (ref 11.5–15.5)
WBC: 8.6 10*3/uL (ref 4.0–10.5)
nRBC: 1.1 % — ABNORMAL HIGH (ref 0.0–0.2)

## 2022-03-17 LAB — CULTURE, BLOOD (ROUTINE X 2)
Culture: NO GROWTH
Culture: NO GROWTH

## 2022-03-17 LAB — BASIC METABOLIC PANEL
Anion gap: 10 (ref 5–15)
BUN: 25 mg/dL — ABNORMAL HIGH (ref 8–23)
CO2: 21 mmol/L — ABNORMAL LOW (ref 22–32)
Calcium: 7.7 mg/dL — ABNORMAL LOW (ref 8.9–10.3)
Chloride: 97 mmol/L — ABNORMAL LOW (ref 98–111)
Creatinine, Ser: 1.32 mg/dL — ABNORMAL HIGH (ref 0.44–1.00)
GFR, Estimated: 44 mL/min — ABNORMAL LOW (ref >60)
Glucose, Bld: 96 mg/dL (ref 70–99)
Potassium: 4 mmol/L (ref 3.5–5.1)
Sodium: 128 mmol/L — ABNORMAL LOW (ref 135–145)

## 2022-03-17 LAB — HEPARIN LEVEL (UNFRACTIONATED)
Heparin Unfractionated: >1.1 IU/mL — ABNORMAL HIGH (ref 0.30–0.70)
Heparin Unfractionated: >1.1 IU/mL — ABNORMAL HIGH (ref 0.30–0.70)
Heparin Unfractionated: 0.79 IU/mL — ABNORMAL HIGH (ref 0.30–0.70)

## 2022-03-17 MED ORDER — HEPARIN (PORCINE) 25000 UT/250ML-% IV SOLN
750.0000 [IU]/h | INTRAVENOUS | Status: DC
  Administered 2022-03-17: 550 [IU]/h via INTRAVENOUS

## 2022-03-17 MED ORDER — SODIUM CHLORIDE 0.9 % IV SOLN: INTRAVENOUS | Status: DC

## 2022-03-17 NOTE — Progress Notes (Signed)
Physical Therapy Treatment Patient Details Name: Kathleen Blankenship MRN: [id number] DOB: [birth year] Today's Date: 03/17/2022   History of Present Illness Pt is a 71 y.o. Female that presente to the ED for back pain, urinary retention. 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 Underwent L hemiarthroplasty 7/20. PMH of HTN, CAD, anxiety, cholecystectomy complicated by brief PEA cardiac arrest.    PT Comments    Pt received upright in bed with family present. Agreeable to PT services. Resting on 2.5L/min with Spo2 > 90%. Reviewed post hip precautions as pt unable to repeat them to PT. Pt endorsing no using abduction wedge when sleeping, pt encouraged and educated to utilize when sleeping to maintain precautions as pt rests with limb adducted and internally rotated.  Pt tolerating LE exercise well with min TC's throughout. Overall good mobility in flexion and abduction. Pt improving in mobility transferring to sitting EOB with supervision, increased time, and HoB elevated. Did rely on modA to transfer towards EOB for feet to reach floor to maintain hip flexion precautions. Total A to don TLSO brace and frequent minA and multimodal cuing to maintain static sitting and prevent post lean. With bed elevated, pt able to stand to RW minguard with VC's for hand placement and step pivot to recliner with minguard and safe use of RW. Pt desats to 86% with OOB mobility requiring seated rest in recliner and PLB to return to >90%. Utilization of 2 pillows to maintain hip in neutral alignment in recliner with TLSO doffed in sitting. HR trending mid to upper 90's with activity. Pt progressing in strength and tolerance with less physical assist but still unable to progress gait due to fatigue. D/c recs remain appropriate at this time but still recommend 24/7  assist/supervision.    Recommendations for follow up therapy are one component of a multi-disciplinary discharge planning process, led by the attending physician.  Recommendations may be updated based on patient status, additional functional criteria and insurance authorization.  Follow Up Recommendations  Home health PT     Assistance Recommended at Discharge Frequent or constant Supervision/Assistance  Patient can return home with the following Two people to help with walking and/or transfers;A lot of help with bathing/dressing/bathroom;Assistance with feeding;Assist for transportation;Direct supervision/assist for financial management;Assistance with cooking/housework;Help with stairs or ramp for entrance;Direct supervision/assist for medications management   Equipment Recommendations  None recommended by PT    Recommendations for Other Services       Precautions / Restrictions Precautions Precautions: Fall;Posterior Hip Precaution Booklet Issued: Yes (comment) Restrictions Weight Bearing Restrictions: Yes LLE Weight Bearing: Weight bearing as tolerated     Mobility  Bed Mobility Overal bed mobility: Needs Assistance Bed Mobility: Supine to Sit     Supine to sit: Supervision, HOB elevated     General bed mobility comments: INcreased time and bed features to reach EOB. Does rely on mod A to scoot anteriroyl for feet to reach floor just to maintain post hip precautions. Patient Response: Cooperative  Transfers Overall transfer level: Needs assistance Equipment used: Rolling walker (2 wheels) Transfers: Sit to/from Stand, Bed to chair/wheelchair/BSC Sit to Stand: Min assist   Step pivot transfers: Min guard       General transfer comment: Bed slightly elevated and VC's for hand positioning. Minguard for step pivot    Ambulation/Gait  General Gait Details: deferred due to fatigue   Stairs             Wheelchair Mobility    Modified  [place]in (Stroke Patients Only)       Balance Overall balance assessment: Needs assistance Sitting-balance support: Feet supported, Bilateral upper extremity supported Sitting balance-Leahy Scale: Poor Sitting balance - Comments: frequent post lean requiring multimodal cues to maintain upright sitting Postural control: Posterior lean Standing balance support: Reliant on assistive device for balance Standing balance-Leahy Scale: Poor Standing balance comment: post lean in standing requiring VC's to correct. once corrected, fair standing balance appreciated                            Cognition Arousal/Alertness: Awake/alert Behavior During Therapy: WFL for tasks assessed/performed Overall Cognitive Status: Within Functional Limits for tasks assessed                                          Exercises Total Joint Exercises Ankle Circles/Pumps: Strengthening, Both, 10 reps Quad Sets: Strengthening, 10 reps, Right, Supine Gluteal Sets: Strengthening, Both, 10 reps Short Arc Quad: Strengthening, Right, 10 reps Heel Slides: AAROM, Strengthening, Right, 10 reps Hip ABduction/ADduction: AROM, Strengthening, Right, 10 reps Marching in Standing: AROM, Strengthening, Both, 5 reps Other Exercises Other Exercises: upright posture, post hip precautions, scap retractions for periscap and postural strengthening    General Comments General comments (skin integrity, edema, etc.): SPO2 desat to 86% with OOB activity on 2.5 L. Requiring seated rest and PLB to return to > 90%. HR in mid to upper 90's with activity.      Pertinent Vitals/Pain Pain Assessment Pain Assessment: Faces Faces Pain Scale: Hurts a little bit Pain Location: L hip Pain Descriptors / Indicators: Grimacing Pain Intervention(s): Limited activity within patient's tolerance, Monitored during session, Repositioned    Home Living                          Prior Function            PT  Goals (current goals can now be found in the care plan section) Acute Rehab PT Goals Patient Stated Goal: to go home PT Goal Formulation: With patient Time For Goal Achievement: 03/29/22 Potential to Achieve Goals: Good Progress towards PT goals: Progressing toward goals    Frequency    7X/week      PT Plan Current plan remains appropriate    Co-evaluation              AM-PAC PT "6 Clicks" Mobility   Outcome Measure  Help needed turning from your back to your side while in a flat bed without using bedrails?: A Little Help needed moving from lying on your back to sitting on the side of a flat bed without using bedrails?: A Little Help needed moving to and from a bed to a chair (including a wheelchair)?: A Lot Help needed standing up from a chair using your arms (e.g., wheelchair or bedside chair)?: A Little Help needed to walk in hospital room?: A Lot Help needed climbing 3-5 steps with a railing? : Total 6 Click Score: 14    End of Session Equipment Utilized During Treatment: Oxygen;Gait belt;Back brace Activity Tolerance: Patient tolerated treatment well Patient left: in chair;with call bell/phone within reach;with chair alarm set;with  family/visitor present Nurse Communication: Mobility status PT Visit Diagnosis: Other abnormalities of gait and mobility (R26.89);Difficulty in walking, not elsewhere classified (R26.2);Muscle weakness (generalized) (M62.81);Pain Pain - Right/Left: Left Pain - part of body: Hip     Time: 3762-8315 PT Time Calculation (min) (ACUTE ONLY): 32 min  Charges:  $Therapeutic Exercise: 8-22 mins $Therapeutic Activity: 8-22 mins                     Daxtyn Rottenberg M. Fairly IV, PT, DPT Physical Therapist- [facility]  03/17/2022, 10:51 AM

## 2022-03-17 NOTE — Progress Notes (Signed)
[place] for heparin infusion Indication: atrial fibrillation  Allergies  Allergen Reactions   Codeine Hives   Methotrexate Nausea Only   Remicade [Infliximab] Other (See Comments)    Chest felt heavy, was given benadryl.    Patient Measurements: Height: '5\' 1"'$  (154.9 cm) Weight: 51 kg (112 lb 7 oz) IBW/kg (Calculated) : 47.8 Heparin Dosing Weight: 51 kg  Vital Signs: Temp: 97.9 F (36.6 C) (07/23 0742) BP: 169/126 (07/23 0742) Pulse Rate: 96 (07/23 0742)  Labs: Recent Labs    03/15/22 0445 03/15/22 1800 03/15/22 2020 03/16/22 0330 03/16/22 1100 03/16/22 1700 03/16/22 1950 03/17/22 0633  HGB 7.3* 12.2  --  11.4*  --   --   --  11.0*  HCT 22.2* 36.1  --  33.4*  --   --   --  32.4*  PLT 289  --   --  278  --   --   --  243  APTT  --   --  37*  --   --   --   --   --   LABPROT  --   --  13.4  --   --   --   --   --   INR  --   --  1.0  --   --   --   --   --   HEPARINUNFRC  --   --   --  0.70   < > >1.10* 0.50 >1.10*  CREATININE 0.52  --   --  0.93  --   --   --  1.32*  TROPONINIHS  --  19* 23*  --   --   --   --   --    < > = values in this interval not displayed.   Heparin Dosing Weight: 51 kg  Estimated Creatinine Clearance: 30.4 mL/min (A) (by C-G formula based on SCr of 1.32 mg/dL (H)).  Medical History: Past Medical History:  Diagnosis Date   Anginal pain ([place])    Anxiety    Arthritis    RA   Cancer ([place])    Complication of anesthesia 12/06/2021   PEA Cardiac Arrest with ROSC after 1 round of chest compressions and 1 mg epinephrine. Infiltrated IV found after rescusitative attempts were made.   Coronary artery disease    GERD (gastroesophageal reflux disease)    Hypertension    Pneumonia    Medications: Heparin Dosing Weight: 51 kg Pt initially ordered Enoxaparin 40 mg and given 1 dose 7/21 @ 0821; then transitioned to heparin gtt.  Assessment: Pt is a 71 yo female admitted 03/12/22 that developed AFib,  confirmed by EKG, this evening.  Goal of Therapy:  Heparin level 0.3-0.7 units/ml Monitor platelets by anticoagulation protocol: Yes    Date/time HL/aPTT Comments 7/22'@0330'$  HL 0.70 Therapeutic, adj 750 un/hr to 700 un/hr 7/22'@1100'$  HL 0.55 Therapeutic @ 700 un/hr  7/22'@1700'$  HL > 1.10  In error , repeat lab 7/22'@1950'$  HL 0.50 Therapeutic x 2, continue at 700 un/hr 7/23'@0633'$  HL > 1.10 In error?, repeat lab to confirm 7/23'@0845'$  HL > 1.10 On repeat elevated; 700 > 550un/hr    Plan:  No boluses per MD, "closely watch Hgb". NOTE: Last transfused 2u pRBC evening 7/21. [Hgb 7.3>trf>12.2>11.4>11] Hold for 1hr; then reduce heparin infusion rate to 550 units/hr Re-check HL in 6hrs to confirm rate change CBC daily while on heparin  Lorna Dibble, PharmD, [place] Pharmacist 03/17/2022 7:46 AM

## 2022-03-17 NOTE — Assessment & Plan Note (Addendum)
Creatinine increased to 1.32 with GFR 44 on 7/23.  Urine output has declined.  Have started some IV fluids. Cr better today

## 2022-03-17 NOTE — Progress Notes (Signed)
[place] for heparin infusion Indication: atrial fibrillation  Allergies  Allergen Reactions   Codeine Hives   Methotrexate Nausea Only   Remicade [Infliximab] Other (See Comments)    Chest felt heavy, was given benadryl.    Patient Measurements: Height: '5\' 1"'$  (154.9 cm) Weight: 51 kg (112 lb 7 oz) IBW/kg (Calculated) : 47.8 Heparin Dosing Weight: 51 kg  Vital Signs: Temp: 97.5 F (36.4 C) (07/23 1452) BP: 116/63 (07/23 1452) Pulse Rate: 82 (07/23 1815)  Labs: Recent Labs    03/15/22 0445 03/15/22 1800 03/15/22 2020 03/16/22 0330 03/16/22 1100 03/17/22 0633 03/17/22 0845 03/17/22 1800  HGB 7.3* 12.2  --  11.4*  --  11.0*  --   --   HCT 22.2* 36.1  --  33.4*  --  32.4*  --   --   PLT 289  --   --  278  --  243  --   --   APTT  --   --  37*  --   --   --   --   --   LABPROT  --   --  13.4  --   --   --   --   --   INR  --   --  1.0  --   --   --   --   --   HEPARINUNFRC  --   --   --  0.70   < > >1.10* >1.10* 0.79*  CREATININE 0.52  --   --  0.93  --  1.32*  --   --   TROPONINIHS  --  19* 23*  --   --   --   --   --    < > = values in this interval not displayed.   Heparin Dosing Weight: 51 kg  Estimated Creatinine Clearance: 30.4 mL/min (A) (by C-G formula based on SCr of 1.32 mg/dL (H)).  Medical History: Past Medical History:  Diagnosis Date   Anginal pain ([place])    Anxiety    Arthritis    RA   Cancer ([place])    Complication of anesthesia 12/06/2021   PEA Cardiac Arrest with ROSC after 1 round of chest compressions and 1 mg epinephrine. Infiltrated IV found after rescusitative attempts were made.   Coronary artery disease    GERD (gastroesophageal reflux disease)    Hypertension    Pneumonia    Medications: Heparin Dosing Weight: 51 kg Pt initially ordered Enoxaparin 40 mg and given 1 dose 7/21 @ 0821; then transitioned to heparin gtt.  Assessment: Pt is a 71 yo female admitted 03/12/22 that developed AFib,  confirmed by EKG, this evening.  Goal of Therapy:  Heparin level 0.3-0.7 units/ml Monitor platelets by anticoagulation protocol: Yes    Date/time HL/aPTT Comments 7/22'@0330'$  HL 0.70 Therapeutic, adj 750 un/hr to 700 un/hr 7/22'@1100'$  HL 0.55 Therapeutic @ 700 un/hr  7/22'@1700'$  HL > 1.10  In error , repeat lab 7/22'@1950'$  HL 0.50 Therapeutic x 2, continue at 700 un/hr 7/23'@0633'$  HL > 1.10 In error?, repeat lab to confirm 7/23'@0845'$  HL > 1.10 On repeat elevated; 700 > 550un/hr 7/23'@1800'$  HL 0.79 Slightly above goal at 550 un/hr    Plan:  No boluses per MD, "closely watch Hgb". NOTE: Last transfused 2u pRBC evening 7/21. [Hgb 7.3>trf>12.2>11.4>11] Reduce heparin infusion rate to 500 units/hr Re-check HL in 8 hrs to confirm rate change CBC daily while on heparin  Chamara Dyck Rodriguez-Guzman PharmD, BCPS 03/17/2022 6:35 PM

## 2022-03-17 NOTE — Progress Notes (Signed)
Subjective:  POD #3 s/p left hip hemiarthroplasty.   Patient reports left hip pain as mild.  Patient states her breathing is her major issue.  She is currently on a nasal cannula for supplemental O2.  Family members at the bedside.  Patient on the telemetry floor for episode of rapid atrial fibrillation for which she was placed on a Cardizem drip.  Objective:   VITALS:   Vitals:   03/17/22 0742 03/17/22 1139 03/17/22 1159 03/17/22 1452  BP: (!) 169/126 104/71  116/63  Pulse: 96 91  84  Resp: '18 18  18  '$ Temp: 97.9 F (36.6 C) 97.7 F (36.5 C)  (!) 97.5 F (36.4 C)  TempSrc:      SpO2: 100% 100% 96% 100%  Weight:      Height:        PHYSICAL EXAM: Left lower extremity: I personally change the patient's ended today.  The bandage was saturated with sanguinous drainage.  The wound was cleaned with Hibiclens.  There is no evidence of active bleeding.  A new Aquacel dressing was placed.  Her left lower extremity exam demonstrated Neurovascular intact Sensation intact distally Intact pulses distally Dorsiflexion/Plantar flexion intact No cellulitis present Compartment soft  LABS  Results for orders placed or performed during the hospital encounter of 03/12/22 (from the past 24 hour(s))  Heparin level (unfractionated)     Status: Abnormal   Collection Time: 03/16/22  5:00 PM  Result Value Ref Range   Heparin Unfractionated >1.10 (H) 0.30 - 0.70 IU/mL  Heparin level (unfractionated)     Status: None   Collection Time: 03/16/22  7:50 PM  Result Value Ref Range   Heparin Unfractionated 0.50 0.30 - 0.70 IU/mL  CBC     Status: Abnormal   Collection Time: 03/17/22  6:33 AM  Result Value Ref Range   WBC 8.6 4.0 - 10.5 K/uL   RBC 3.66 (L) 3.87 - 5.11 MIL/uL   Hemoglobin 11.0 (L) 12.0 - 15.0 g/dL   HCT 32.4 (L) 36.0 - 46.0 %   MCV 88.5 80.0 - 100.0 fL   MCH 30.1 26.0 - 34.0 pg   MCHC 34.0 30.0 - 36.0 g/dL   RDW 18.0 (H) 11.5 - 15.5 %   Platelets 243 150 - 400 K/uL   nRBC 1.1  (H) 0.0 - 0.2 %  Basic metabolic panel     Status: Abnormal   Collection Time: 03/17/22  6:33 AM  Result Value Ref Range   Sodium 128 (L) 135 - 145 mmol/L   Potassium 4.0 3.5 - 5.1 mmol/L   Chloride 97 (L) 98 - 111 mmol/L   CO2 21 (L) 22 - 32 mmol/L   Glucose, Bld 96 70 - 99 mg/dL   BUN 25 (H) 8 - 23 mg/dL   Creatinine, Ser 1.32 (H) 0.44 - 1.00 mg/dL   Calcium 7.7 (L) 8.9 - 10.3 mg/dL   GFR, Estimated 44 (L) >60 mL/min   Anion gap 10 5 - 15  Heparin level (unfractionated)     Status: Abnormal   Collection Time: 03/17/22  6:33 AM  Result Value Ref Range   Heparin Unfractionated >1.10 (H) 0.30 - 0.70 IU/mL  Heparin level (unfractionated)     Status: Abnormal   Collection Time: 03/17/22  8:45 AM  Result Value Ref Range   Heparin Unfractionated >1.10 (H) 0.30 - 0.70 IU/mL    No results found.  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  Continue physical therapy as tolerated.  Patient is weightbearing as tolerated with posterior hip precautions on the left lower extremity.  Patient currently on a continuous heparin infusion.  Awaiting surgical pathology results.    Thornton Park , MD 03/17/2022, 3:08 PM

## 2022-03-17 NOTE — Progress Notes (Signed)
[facility] Progress Note  Patient: Kathleen Blankenship    ELF:[id number]  DOA: 03/12/2022    Date of Service: the patient was seen and examined on 03/17/2022  Brief hospital course: 71 year old female with history of CAD, hypertension, PEA cardiac arrest postoperatively after lap chole on 11/2011, and ongoing tobacco use presented to the emergency department on 7/18 with progressively worsening back and left hip pain for several weeks causing difficulty to ambulate.  MRI done revealed extensive osseous metastatic disease with CT lumbar spine noting pathologic fractures at T4 and T10.  Also found to have a displaced left femoral neck pathologic fracture.  Lab work noteworthy for sodium of 130, albumin of 2.5.  Orthopedics and oncology consulted.  Course complicated by rapid atrial fibrillation that started on evening of 7/21.  Patient placed on a Cardizem drip.  Assessment and Plan: Assessment and Plan: * 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  Seen by orthopedic surgery and underwent a left hemiarthroplasty on 7/20.  Surgical pathology pending.  Seen by PT and OT who are recommending home health.  Metastatic cancer to bone [facility]) Oncology following, suspicious for likely primary from breast.  Pathology from left femur fracture repair pending. -Breast tumor markers (CA 27-29, CEA 15-3) ordered per oncology noted to be elevated.  Palliative care consulted.  Patient has established as a DNR.  For now, primary focus is on pain control.  AKI (acute kidney injury) ([place]) Creatinine increased to 1.32 with GFR 44 on 7/23.  Urine output has declined.  Have started some IV fluids and recheck labs in the morning.  Also check BNP given diastolic heart failure.  Atrial fibrillation with RVR ([place]) Started 7/21 evening.  Initially started on IV Cardizem.  Changed over to p.o. Cardizem and heart rate staying under control.  On heparin drip.  Cardiology  following.  PNA (pneumonia) - Patient with history of rheumatoid arthritis on Rinvoq and leflunomide with concerns for metastatic Pathologic Fracture. -CT chest with small layering right pleural effusion, right lower lobe airspace disease greater than typically seen with compressive atelectasis may be secondary to pneumonia however aspiration is considered given layering debris in the right mainstem bronchus.  No obvious pulmonary mass. -Patient also with moderate hiatal hernia with dilated esophagus and intraluminal debris to the level of the thoracic inlet noted. -Urine strep pneumococcus antigen negative.  Urine Legionella antigen negative -Continue IV Zosyn, Mucinex.    Pleural effusion on right - This could be associated with right lower lobe airspace disease/pneumonia. - She does have cough however without fever or leukocytosis and she is a chronic smoker. -Patient with concern for metastatic disease, pathologic fracture, recent weight loss, noted to be on [place], [place] and likely immunocompromised and as such patient placed empirically on IV Zosyn for probable pneumonia.   -Continue to hold [place].  -Supportive care  UTI (urinary tract infection) - Urinalysis concerning for UTI with moderate leukocytes, positive nitrite, many bacteria.  Urine cultures with multiple species.  Currently on IV Zosyn, completed on 7/24 for 7 days.  Compression fracture of body of thoracic vertebra (HCC) - Noted at T4, T10 likely pathologic in nature versus secondary to osteoporosis. -IR consulted for evaluation for kyphoplasty.  Protein-calorie malnutrition, severe Nutrition Status: Nutrition Problem: Severe Malnutrition Etiology: chronic illness (metastatic bone disease) Signs/Symptoms: moderate fat depletion, severe fat depletion, moderate muscle depletion, severe muscle depletion Interventions: MVI, Carnation Instant Breakfast     Chronic diastolic CHF (congestive heart failure) (HCC) Appears  more dry than euvolemic.  Echocardiogram on 7/19 noted grade 1 diastolic dysfunction.  Checking BNP in the morning.  Pressure injury of skin Pressure Injury 03/13/22 Perineum Bilateral Stage 2 -  Partial thickness loss of dermis presenting as a shallow open injury with a red, pink wound bed without slough. (Active)  03/13/22 0130  Location: Perineum  Location Orientation: Bilateral  Staging: Stage 2 -  Partial thickness loss of dermis presenting as a shallow open injury with a red, pink wound bed without slough.  Wound Description (Comments):   Present on Admission: Yes    Stage II perineal ulcer, present on admission.  Wound care per nursing.  Anemia No signs of bleeding.  Consistent with anemia of chronic disease.  Received 1 unit packed red blood cell transfusion 7/20 and then 2 more units given prior to surgery after hemoglobin dropped to 7.3.  Postsurgery, no significant drop.  Hemoglobin at 11.4.  Continue to follow.  Essential hypertension -Continue home regimen Toprol-XL, Cozaar.  Dyslipidemia - Continue statin.  Rheumatoid arthritis ([place]) - The patient is on Rinvoq and leflunomide. -Leflunomide held, Outpatient follow-up.  GERD (gastroesophageal reflux disease) Continue PPI therapy.  Anxiety and depression As needed Ativan.  Continue home Zoloft.  Choledocholithiasis Incidental finding.  Asymptomatic.       Body mass index is 21.24 kg/m.  Nutrition Problem: Severe Malnutrition Etiology: chronic illness (metastatic bone disease) Pressure Injury 03/13/22 Perineum Bilateral Stage 2 -  Partial thickness loss of dermis presenting as a shallow open injury with a red, pink wound bed without slough. (Active)  03/13/22 0130  Location: Perineum  Location Orientation: Bilateral  Staging: Stage 2 -  Partial thickness loss of dermis presenting as a shallow open injury with a red, pink wound bed without slough.  Wound Description (Comments):   Present on Admission: Yes   Dressing Type Foam - Lift dressing to assess site every shift 03/15/22 0100     Consultants: Oncology Cardiology for preop clearance Interventional radiology Palliative care Orthopedic surgery  Procedures: Status post left hip hemiarthroplasty 7/20 Transfusion packed red blood cells x3 Echocardiogram 1/61: Grade 1 diastolic dysfunction  Antimicrobials: IV Zosyn 7/18 -7/24  Code Status: DNR   Subjective: Still having some episodes of pain.  Feels very tired.  Objective: Vital signs were reviewed and unremarkable. Vitals:   03/17/22 1815 03/17/22 2025  BP:    Pulse: 82   Resp: 16   Temp:  98.9 F (37.2 C)  SpO2: 98%     Intake/Output Summary (Last 24 hours) at 03/17/2022 2103 Last data filed at 03/17/2022 1300 Gross per 24 hour  Intake 133.72 ml  Output --  Net 133.72 ml    Filed Weights   03/12/22 2027 03/14/22 0957  Weight: 51 kg 51 kg   Body mass index is 21.24 kg/m.  Exam:  General: Alert and oriented x3, no acute distress, fatigued HEENT: Normocephalic and atraumatic, mucous membranes are moist Cardiovascular: Irregular rhythm, rate controlled Respiratory: Decreased breath sounds on right side from midway down Abdomen: Soft, nontender, nondistended, positive bowel sounds Musculoskeletal: No clubbing or cyanosis, trace pitting edema Skin: No skin breaks, tears or lesions other than site of surgery Psychiatry: Appropriate, no evidence of psychoses Neurology: No focal deficits  Data Reviewed: Sodium of 128, creatinine increased to 1.32 with GFR of 44.  Hemoglobin stable.  Disposition:  Status is: Inpatient Remains inpatient appropriate because:  -Pain control. -Pathology from surgery    Anticipated discharge date: 7/25  Family Communication: Left message  for sister DVT Prophylaxis: SCDs Start: 03/14/22 1512 Place TED hose Start: 03/14/22 1512   Heparin drip   Author: Annita Brod ,MD 03/17/2022 9:03 PM  To reach On-call, see  care teams to locate the attending and reach out via [email or web address]. Between 7PM-7AM, please contact night-coverage If you still have difficulty reaching the attending provider, please page the [facility] (Director on Call) for [facility] on amion for assistance.

## 2022-03-17 NOTE — Assessment & Plan Note (Addendum)
Appears more dry than euvolemic.  Echocardiogram on 7/19 noted grade 1 diastolic dysfunction.

## 2022-03-18 ENCOUNTER — Encounter: Payer: Self-pay | Admitting: Family Medicine

## 2022-03-18 ENCOUNTER — Telehealth (HOSPITAL_COMMUNITY): Payer: Self-pay | Admitting: Pharmacy Technician

## 2022-03-18 ENCOUNTER — Other Ambulatory Visit (HOSPITAL_COMMUNITY): Payer: Self-pay

## 2022-03-18 DIAGNOSIS — E871 Hypo-osmolality and hyponatremia: Secondary | ICD-10-CM

## 2022-03-18 DIAGNOSIS — Z515 Encounter for palliative care: Secondary | ICD-10-CM | POA: Diagnosis not present

## 2022-03-18 DIAGNOSIS — M8450XA Pathological fracture in neoplastic disease, unspecified site, initial encounter for fracture: Secondary | ICD-10-CM | POA: Diagnosis not present

## 2022-03-18 LAB — BASIC METABOLIC PANEL WITH GFR
Anion gap: 11 (ref 5–15)
BUN: 26 mg/dL — ABNORMAL HIGH (ref 8–23)
CO2: 20 mmol/L — ABNORMAL LOW (ref 22–32)
Calcium: 7.5 mg/dL — ABNORMAL LOW (ref 8.9–10.3)
Chloride: 93 mmol/L — ABNORMAL LOW (ref 98–111)
Creatinine, Ser: 1.19 mg/dL — ABNORMAL HIGH (ref 0.44–1.00)
GFR, Estimated: 49 mL/min — ABNORMAL LOW
Glucose, Bld: 94 mg/dL (ref 70–99)
Potassium: 3.9 mmol/L (ref 3.5–5.1)
Sodium: 124 mmol/L — ABNORMAL LOW (ref 135–145)

## 2022-03-18 LAB — CULTURE, RESPIRATORY W GRAM STAIN

## 2022-03-18 LAB — OSMOLALITY, URINE: Osmolality, Ur: 345 mOsm/kg (ref 300–900)

## 2022-03-18 LAB — SODIUM, URINE, RANDOM: Sodium, Ur: <10 mmol/L

## 2022-03-18 LAB — OSMOLALITY: Osmolality: 271 mOsm/kg — ABNORMAL LOW (ref 275–295)

## 2022-03-18 LAB — HEPARIN LEVEL (UNFRACTIONATED)
Heparin Unfractionated: 0.13 [IU]/mL — ABNORMAL LOW (ref 0.30–0.70)
Heparin Unfractionated: 0.15 IU/mL — ABNORMAL LOW (ref 0.30–0.70)

## 2022-03-18 LAB — CREATININE, URINE, RANDOM: Creatinine, Urine: 87 mg/dL

## 2022-03-18 MED ORDER — APIXABAN 5 MG PO TABS
5.0000 mg | ORAL_TABLET | Freq: Two times a day (BID) | ORAL | Status: DC
  Administered 2022-03-18 – 2022-03-19 (×2): 5 mg via ORAL
  Filled 2022-03-18 (×2): qty 1

## 2022-03-18 NOTE — Progress Notes (Signed)
Occupational Therapy Treatment Patient Details Name: Kathleen Blankenship MRN: [id number] DOB: 1951-03-13 Today's Date: 03/18/2022   History of present illness Pt is a 71 y.o. Female that presente to the ED for back pain, urinary retention. 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 Underwent L hemiarthroplasty 7/20. PMH of HTN, CAD, anxiety, cholecystectomy complicated by brief PEA cardiac arrest.   OT comments  Ms. Funez displays significant fatigue today; she states she does not have much pain, but that she feels "very weak." She becomes worn out by any movement, requires Min-Mod A for supine<>sit, then does not have the strength to maintain EOB sitting, has to complete grooming tasks at bed level. Provided educ re: positioning in bed for comfort and skin integrity, pt verbalizes understanding, attempts, but needs Min A for rolling, 2/2 fatigue. Pt has b/l UE edema, more significant on R. Lymphedema specialist notified. Will continue to follow PoC.   Recommendations for follow up therapy are one component of a multi-disciplinary discharge planning process, led by the attending physician.  Recommendations may be updated based on patient status, additional functional criteria and insurance authorization.    Follow Up Recommendations  Home health OT    Assistance Recommended at Discharge Frequent or constant Supervision/Assistance  Patient can return home with the following  A little help with walking and/or transfers;A lot of help with bathing/dressing/bathroom;Assistance with cooking/housework   Equipment Recommendations       Recommendations for Other Services      Precautions / Restrictions Precautions Precautions: Fall;Posterior Hip Restrictions Weight Bearing Restrictions: Yes LLE Weight Bearing: Weight bearing as  tolerated       Mobility Bed Mobility Overal bed mobility: Needs Assistance Bed Mobility: Rolling, Supine to Sit, Sit to Supine Rolling: Min assist   Supine to sit: Min assist Sit to supine: Mod assist   General bed mobility comments: increased time and effort, Min-Mod A    Transfers                   General transfer comment: pt declined     Balance Overall balance assessment: Needs assistance Sitting-balance support: Feet supported, Bilateral upper extremity supported Sitting balance-Leahy Scale: Poor   Postural control: Posterior lean                                 ADL either performed or assessed with clinical judgement   ADL Overall ADL's : Needs assistance/impaired     Grooming: Wash/dry hands;Wash/dry face;Oral care;Bed level                                      Extremity/Trunk Assessment Upper Extremity Assessment Upper Extremity Assessment: Generalized weakness   Lower Extremity Assessment Lower Extremity Assessment: Generalized weakness        Vision       Perception     Praxis      Cognition Arousal/Alertness: Awake/alert, Lethargic Behavior During Therapy: WFL for tasks assessed/performed Overall Cognitive Status: Within Functional Limits for tasks assessed  Exercises Other Exercises Other Exercises: Educ re: hip precautions, bed mobility for comfort and skin integrity, falls prevention    Shoulder Instructions       General Comments      Pertinent Vitals/ Pain       Pain Assessment Faces Pain Scale: Hurts a little bit Pain Location: L hip Pain Descriptors / Indicators: Aching Pain Intervention(s): Repositioned, Limited activity within patient's tolerance  Home Living                                          Prior Functioning/Environment              Frequency  Min 2X/week        Progress Toward  Goals  OT Goals(current goals can now be found in the care plan section)  Progress towards OT goals: Progressing toward goals  Acute Rehab OT Goals OT Goal Formulation: With patient Time For Goal Achievement: 03/29/22 Potential to Achieve Goals: Good  Plan Discharge plan remains appropriate;Frequency remains appropriate    Co-evaluation                 AM-PAC OT "6 Clicks" Daily Activity     Outcome Measure   Help from another person eating meals?: A Little Help from another person taking care of personal grooming?: A Little Help from another person toileting, which includes using toliet, bedpan, or urinal?: A Lot Help from another person bathing (including washing, rinsing, drying)?: A Lot Help from another person to put on and taking off regular upper body clothing?: A Lot Help from another person to put on and taking off regular lower body clothing?: A Lot 6 Click Score: 14    End of Session    OT Visit Diagnosis: Unsteadiness on feet (R26.81);Muscle weakness (generalized) (M62.81);Pain Pain - Right/Left: Left Pain - part of body: Hip   Activity Tolerance Patient tolerated treatment well   Patient Left in bed;with call bell/phone within reach;with bed alarm set;with family/visitor present   Nurse Communication          Time: [id number] OT Time Calculation (min): 9 min  Charges: OT General Charges $OT Visit: 1 Visit OT Treatments $Self Care/Home Management : 8-22 mins Josiah Lobo, PhD, MS, OTR/L 03/18/22, 2:06 PM

## 2022-03-18 NOTE — Progress Notes (Signed)
Manufacturing engineer [facility]) Hospital Liaison Note  Received request from PMT provider/ Lennox Solders. NP for family interest in [facility]. Visited patient at bedside and spoke with sister/Penny to confirm interest and explain services.  Approval for Hospice Home is determined by [facility] MD. Once [facility] MD has determined Hospice Home eligibility, [place] will update hospital staff and family. Approved for Hospice Home.   Unfortunately, there are no beds available today for patient.  Please do not hesitate to call with any hospice related questions.    Thank you for the opportunity to participate in this patient's care.  Daphene Calamity, MSW [facility] Liaison  [phone]

## 2022-03-18 NOTE — Progress Notes (Signed)
[facility] for heparin infusion Indication: atrial fibrillation  Allergies  Allergen Reactions   Codeine Hives   Methotrexate Nausea Only   Remicade [Infliximab] Other (See Comments)    Chest felt heavy, was given benadryl.    Patient Measurements: Height: '5\' 1"'$  (154.9 cm) Weight: 51 kg (112 lb 7 oz) IBW/kg (Calculated) : 47.8 Heparin Dosing Weight: 51 kg  Vital Signs: Temp: 97.8 F (36.6 C) (07/24 0859) Temp Source: Oral (07/24 0859) BP: 146/100 (07/24 0859) Pulse Rate: 96 (07/24 0859)  Labs: Recent Labs    03/15/22 1800 03/15/22 2020 03/16/22 0330 03/16/22 1100 03/17/22 0633 03/17/22 0845 03/17/22 1800 03/18/22 0335 03/18/22 1215  HGB 12.2  --  11.4*  --  11.0*  --   --   --   --   HCT 36.1  --  33.4*  --  32.4*  --   --   --   --   PLT  --   --  278  --  243  --   --   --   --   APTT  --  37*  --   --   --   --   --   --   --   LABPROT  --  13.4  --   --   --   --   --   --   --   INR  --  1.0  --   --   --   --   --   --   --   HEPARINUNFRC  --   --  0.70   < > >1.10*   < > 0.79* 0.13* 0.15*  CREATININE  --   --  0.93  --  1.32*  --   --  1.19*  --   TROPONINIHS 19* 23*  --   --   --   --   --   --   --    < > = values in this interval not displayed.   Heparin Dosing Weight: 51 kg  Estimated Creatinine Clearance: 33.7 mL/min (A) (by C-G formula based on SCr of 1.19 mg/dL (H)).  Medical History: Past Medical History:  Diagnosis Date   Anginal pain ([place])    Anxiety    Arthritis    RA   Cancer ([place])    Complication of anesthesia 12/06/2021   PEA Cardiac Arrest with ROSC after 1 round of chest compressions and 1 mg epinephrine. Infiltrated IV found after rescusitative attempts were made.   Coronary artery disease    GERD (gastroesophageal reflux disease)    Hypertension    Pneumonia    Medications: Heparin Dosing Weight: 51 kg Pt initially ordered Enoxaparin 40 mg and given 1 dose 7/21 @ 0821; then transitioned  to heparin gtt.  Assessment: Pt is a 71 yo female admitted 03/12/22 that developed AFib, confirmed by EKG, this evening.  Goal of Therapy:  Heparin level 0.3-0.7 units/ml Monitor platelets by anticoagulation protocol: Yes    Date/time HL/aPTT Comments 7/22'@0330'$  HL 0.70 Therapeutic, adj 750 un/hr to 700 un/hr 7/22'@1100'$  HL 0.55 Therapeutic @ 700 un/hr  7/22'@1700'$  HL > 1.10  In error , repeat lab 7/22'@1950'$  HL 0.50 Therapeutic x 2, continue at 700 un/hr 7/23'@0633'$  HL > 1.10 In error?, repeat lab to confirm 7/23'@0845'$  HL > 1.10 On repeat elevated; 700 > 550un/hr 7/23'@1800'$  HL 0.79 Slightly above goal at 550 un/hr  7/24'@0335'$  HL=0.13 Subtherapeutic increase from 500 units/hr to 600 units/hr  7/24'@1215'$  HL= 0.15 Subtherapeutic 600-750 units/hr   Plan:  7/24'@0335'$  HL=0.13  Subtherapeutic  Increase heparin infusion rate to 750 units/hr Re-check HL in 8 hrs to confirm rate change CBC daily while on heparin  Dorothe Pea, PharmD, BCPS Clinical Pharmacist   03/18/2022

## 2022-03-18 NOTE — Progress Notes (Signed)
[place] at [facility] Telephone:([phone]) [phone] Fax:([phone]) [phone]   Name: Kathleen Blankenship Date: 03/18/2022 MRN: [id number]  DOB: 28-Dec-1950  Patient Care Team: Tracie Harrier, MD as PCP - General (Internal Medicine)    REASON FOR CONSULTATION: Kathleen Blankenship is a 71 y.o. female with multiple medical problems including CAD, hypertension, GERD, history of cholecystectomy in April 2023 with subsequent postop PEA arrest.  Patient was admitted to hospital 03/12/2022 with worsening back pain with thoracic and lumbar MRI, followed by CTs of the chest/abdomen/pelvis showing extensive osseous metastatic disease.  Patient was found to have pathologic fractures of T4, T10, and L5.  She was noted to have numerous hepatic metastases.  She was also found to have a pathologic displaced left femoral neck fracture and underwent hemiarthroplasty.  Palliative care was consulted to address goals and manage ongoing symptoms.   CODE STATUS: DNR  PAST MEDICAL HISTORY: Past Medical History:  Diagnosis Date   Anginal pain ([place])    Anxiety    Arthritis    RA   Cancer ([place])    Complication of anesthesia 12/06/2021   PEA Cardiac Arrest with ROSC after 1 round of chest compressions and 1 mg epinephrine. Infiltrated IV found after rescusitative attempts were made.   Coronary artery disease    GERD (gastroesophageal reflux disease)    Hypertension    Pneumonia     PAST SURGICAL HISTORY:  Past Surgical History:  Procedure Laterality Date   CARDIAC CATHETERIZATION     CORONARY/GRAFT ACUTE MI REVASCULARIZATION N/A 05/28/2017   Procedure: Coronary/Graft Acute MI Revascularization;  Surgeon: Isaias Cowman, MD;  Location: [place] [facility];  Service: Cardiovascular;  Laterality: N/A;   ERCP N/A 11/22/2021   Procedure: ENDOSCOPIC RETROGRADE CHOLANGIOPANCREATOGRAPHY (ERCP);  Surgeon: Lucilla Lame, MD;  Location: [facility] ENDOSCOPY;  Service: Endoscopy;   Laterality: N/A;   HIP ARTHROPLASTY Left 03/14/2022   Procedure: ARTHROPLASTY BIPOLAR HIP (HEMIARTHROPLASTY);  Surgeon: Thornton Park, MD;  Location: [facility] ORS;  Service: Orthopedics;  Laterality: Left;   LEFT HEART CATH AND CORONARY ANGIOGRAPHY N/A 05/28/2017   Procedure: LEFT HEART CATH AND CORONARY ANGIOGRAPHY;  Surgeon: Isaias Cowman, MD;  Location: [place] [facility];  Service: Cardiovascular;  Laterality: N/A;   ORIF ELBOW FRACTURE Right 07/26/2020   Procedure: OPEN REDUCTION INTERNAL FIXATION (ORIF) ELBOW/OLECRANON FRACTURE;  Surgeon: Shona Needles, MD;  Location: [place];  Service: Orthopedics;  Laterality: Right;   REVERSE SHOULDER ARTHROPLASTY Left 07/29/2020   Procedure: REVERSE SHOULDER ARTHROPLASTY;  Surgeon: Hiram Gash, MD;  Location: [place];  Service: Orthopedics;  Laterality: Left;   THYROID LOBECTOMY      HEMATOLOGY/ONCOLOGY HISTORY:  Oncology History   No history exists.    ALLERGIES:  is allergic to codeine, methotrexate, and remicade [infliximab].  MEDICATIONS:  Current Facility-Administered Medications  Medication Dose Route Frequency Provider Last Rate Last Admin   0.9 %  sodium chloride infusion   Intravenous PRN Annita Brod, MD 10 mL/hr at 03/17/22 2245 New Bag at 03/17/22 2245   0.9 %  sodium chloride infusion   Intravenous Continuous Annita Brod, MD 100 mL/hr at 03/18/22 1412 New Bag at 03/18/22 1412   acetaminophen (TYLENOL) tablet 650 mg  650 mg Oral Q6H PRN Thornton Park, MD   650 mg at 03/13/22 0347   Or   acetaminophen (TYLENOL) suppository 650 mg  650 mg Rectal Q6H PRN Thornton Park, MD       albuterol (PROVENTIL) (2.5 MG/3ML)  0.083% nebulizer solution 3 mL  3 mL Nebulization Q6H PRN Thornton Park, MD   3 mL at 03/17/22 1815   alum & mag hydroxide-simeth (MAALOX/MYLANTA) 200-200-20 MG/5ML suspension 30 mL  30 mL Oral Q4H PRN Thornton Park, MD       atorvastatin (LIPITOR) tablet 10 mg  10 mg Oral Daily Thornton Park,  MD   10 mg at 03/18/22 0932   bisacodyl (DULCOLAX) suppository 10 mg  10 mg Rectal Daily PRN Thornton Park, MD       Chlorhexidine Gluconate Cloth 2 % PADS 6 each  6 each Topical Daily Thornton Park, MD   6 each at 03/18/22 1058   cholecalciferol (VITAMIN D3) tablet 5,000 Units  5,000 Units Oral Daily Thornton Park, MD   5,000 Units at 03/18/22 0932   cyclobenzaprine (FLEXERIL) tablet 5-10 mg  5-10 mg Oral QHS PRN Thornton Park, MD   10 mg at 03/13/22 0014   diltiazem (CARDIZEM) tablet 60 mg  60 mg Oral Q6H Callwood, Dwayne D, MD   60 mg at 03/18/22 1159   docusate sodium (COLACE) capsule 100 mg  100 mg Oral BID Thornton Park, MD   100 mg at 03/15/22 2122   feeding supplement (ENSURE ENLIVE / ENSURE PLUS) liquid 237 mL  237 mL Oral BID BM Thornton Park, MD   237 mL at 03/17/22 0852   gabapentin (NEURONTIN) capsule 300 mg  300 mg Oral QHS Lamondre Wesche, Kirt Boys, NP   300 mg at 03/17/22 2240   guaiFENesin (MUCINEX) 12 hr tablet 1,200 mg  1,200 mg Oral BID Thornton Park, MD   1,200 mg at 03/18/22 0932   heparin ADULT infusion 100 units/mL (25000 units/277m)  750 Units/hr Intravenous Continuous DDorothe Pea RPH 7.5 mL/hr at 03/18/22 1408 750 Units/hr at 03/18/22 1408   LORazepam (ATIVAN) tablet 0.5 mg  0.5 mg Oral BID PRN KThornton Park MD       magnesium hydroxide (MILK OF MAGNESIA) suspension 30 mL  30 mL Oral Daily PRN KThornton Park MD       menthol-cetylpyridinium (CEPACOL) lozenge 3 mg  1 lozenge Oral PRN KThornton Park MD       Or   phenol (CHLORASEPTIC) mouth spray 1 spray  1 spray Mouth/Throat PRN KThornton Park MD       metoprolol succinate (TOPROL-XL) 24 hr tablet 50 mg  50 mg Oral Daily KThornton Park MD   50 mg at 03/18/22 0932   multivitamin with minerals tablet 1 tablet  1 tablet Oral Daily KThornton Park MD   1 tablet at 03/18/22 0932   naphazoline-pheniramine (NAPHCON-A) 0.025-0.3 % ophthalmic solution 1 drop  1 drop Both Eyes QID PRN TEugenie Filler MD   1 drop at 03/14/22 2027   ondansetron (ZOFRAN) tablet 4 mg  4 mg Oral Q6H PRN KThornton Park MD       Or   ondansetron ([facility] injection 4 mg  4 mg Intravenous Q6H PRN KThornton Park MD   4 mg at 03/15/22 1107   oxyCODONE (Oxy IR/ROXICODONE) immediate release tablet 5-10 mg  5-10 mg Oral Q4H PRN Rika Daughdrill, JKirt Boys NP       pantoprazole (PROTONIX) EC tablet 40 mg  40 mg Oral Daily KThornton Park MD   40 mg at 03/18/22 0932   polyethylene glycol (MIRALAX / GLYCOLAX) packet 17 g  17 g Oral Daily PRN KThornton Park MD       senna (SENOKOT) tablet 8.6 mg  1 tablet Oral BID KMack Guise  Lennette Bihari, MD   8.6 mg at 03/15/22 2122   sertraline (ZOLOFT) tablet 50 mg  50 mg Oral QHS Thornton Park, MD   50 mg at 03/17/22 2240   sodium chloride flush (NS) 0.9 % injection 10-40 mL  10-40 mL Intracatheter Q12H Thornton Park, MD   10 mL at 03/18/22 1059   sodium chloride flush (NS) 0.9 % injection 10-40 mL  10-40 mL Intracatheter PRN Thornton Park, MD   10 mL at 03/15/22 2123   traMADol (ULTRAM) tablet 50 mg  50 mg Oral Q6H Alvah Gilder, Kirt Boys, NP   50 mg at 03/18/22 1159   traMADol (ULTRAM) tablet 50 mg  50 mg Oral Q6H PRN Ravina Milner, Kirt Boys, NP       traZODone (DESYREL) tablet 25 mg  25 mg Oral QHS PRN Thornton Park, MD   25 mg at 03/13/22 2104    VITAL SIGNS: BP (!) 146/100 (BP Location: Right Leg)   Pulse 96   Temp 97.8 F (36.6 C) (Oral)   Resp 11   Ht 5' 1"  (1.549 m)   Wt 112 lb 7 oz (51 kg)   SpO2 97%   BMI 21.24 kg/m  [facility] Hights Weights   03/12/22 2027 03/14/22 0957  Weight: 112 lb 7 oz (51 kg) 112 lb 7 oz (51 kg)    Estimated body mass index is 21.24 kg/m as calculated from the following:   Height as of this encounter: 5' 1"  (1.549 m).   Weight as of this encounter: 112 lb 7 oz (51 kg).  LABS: CBC:    Component Value Date/Time   WBC 8.6 03/17/2022 0633   HGB 11.0 (L) 03/17/2022 0633   HGB 13.8 09/10/2012 0825   HCT 32.4 (L) 03/17/2022 0633   HCT 40.9  09/10/2012 0825   PLT 243 03/17/2022 0633   PLT 198 09/10/2012 0825   MCV 88.5 03/17/2022 0633   MCV 80 09/10/2012 0825   NEUTROABS 6.5 03/15/2022 0445   LYMPHSABS 0.3 (L) 03/15/2022 0445   MONOABS 1.3 (H) 03/15/2022 0445   EOSABS 0.0 03/15/2022 0445   BASOSABS 0.0 03/15/2022 0445   Comprehensive Metabolic Panel:    Component Value Date/Time   NA 124 (L) 03/18/2022 0335   NA 137 09/10/2012 0825   K 3.9 03/18/2022 0335   K 3.7 09/10/2012 0825   CL 93 (L) 03/18/2022 0335   CL 104 09/10/2012 0825   CO2 20 (L) 03/18/2022 0335   CO2 25 09/10/2012 0825   BUN 26 (H) 03/18/2022 0335   BUN 6 (L) 09/10/2012 0825   CREATININE 1.19 (H) 03/18/2022 0335   CREATININE 0.64 09/10/2012 0825   GLUCOSE 94 03/18/2022 0335   GLUCOSE 107 (H) 09/10/2012 0825   CALCIUM 7.5 (L) 03/18/2022 0335   CALCIUM 8.8 09/10/2012 0825   AST 38 03/15/2022 0445   ALT 16 03/15/2022 0445   ALKPHOS 119 03/15/2022 0445   BILITOT 0.7 03/15/2022 0445   PROT 4.1 (L) 03/15/2022 0445   ALBUMIN 1.9 (L) 03/15/2022 0445    RADIOGRAPHIC STUDIES: DG Hip Port Unilat With Pelvis 1V Left  Result Date: 03/14/2022 CLINICAL DATA:  Status post left hip replacement EXAM: DG HIP (WITH OR WITHOUT PELVIS) 1V PORT LEFT COMPARISON:  None Available. FINDINGS: Interval postsurgical changes from left total hip arthroplasty for left femoral neck fracture. Arthroplasty components appear in their expected alignment. Numerous scattered lytic lesions compatible with known diffuse osseous metastatic disease. Expected postoperative changes within the overlying soft tissues. IMPRESSION: Postsurgical changes from left  total hip arthroplasty. Electronically Signed   By: Yetta Glassman M.D.   On: 03/14/2022 14:54   DG Knee Complete 4 Views Right  Result Date: 03/13/2022 CLINICAL DATA:  Bilateral knee pain EXAM: RIGHT KNEE - COMPLETE 4+ VIEW COMPARISON:  None Available. FINDINGS: Alignment is anatomic. No joint effusion. No acute fracture. No  significant joint space narrowing. Vascular calcifications. IMPRESSION: No significant osseous abnormality. Electronically Signed   By: Macy Mis M.D.   On: 03/13/2022 13:05   DG Knee Complete 4 Views Left  Result Date: 03/13/2022 CLINICAL DATA:  Left knee injury. EXAM: LEFT KNEE - COMPLETE 4+ VIEW COMPARISON:  None Available. FINDINGS: No evidence of fracture, dislocation, or joint effusion. No evidence of arthropathy or other focal bone abnormality. Soft tissue edema superior to the left knee. IMPRESSION: 1. No acute fracture or dislocation identified about the left knee. 2. Soft tissue edema superior to the left knee. Electronically Signed   By: Fidela Salisbury M.D.   On: 03/13/2022 13:04   ECHOCARDIOGRAM COMPLETE  Result Date: 03/13/2022    ECHOCARDIOGRAM REPORT   Patient Name:   Kathleen Blankenship [facility] Date of Exam: 03/13/2022 Medical Rec #:  [id number]      Height:       61.0 in Accession #:    [id number]     Weight:       112.4 lb Date of Birth:  [birth year]      BSA:          1.479 m Patient Age:    38 years       BP:           126/79 mmHg Patient Gender: F              HR:           100 bpm. Exam Location:  [facility] Procedure: 2D Echo, Color Doppler and Cardiac Doppler Indications:     CAD native vessel I25.10  History:         Patient has prior history of Echocardiogram examinations, most                  recent 12/06/2021. CAD; Risk Factors:Hypertension and Current                  Smoker. Pneumonia.  Sonographer:     Sherrie Sport Referring Phys:  [id number] [place] TANG Diagnosing Phys: Yolonda Kida MD  Sonographer Comments: Suboptimal apical window and suboptimal subcostal window. Image acquisition challenging due to COPD. IMPRESSIONS  1. Left ventricular ejection fraction, by estimation, is 65 to 70%. The left ventricle has normal function. The left ventricle has no regional wall motion abnormalities. There is mild left ventricular hypertrophy. Left ventricular diastolic parameters are consistent  with Grade I diastolic dysfunction (impaired relaxation).  2. Right ventricular systolic function is normal. The right ventricular size is normal.  3. The mitral valve is normal in structure. Trivial mitral valve regurgitation.  4. The aortic valve is normal in structure. Aortic valve regurgitation is not visualized. FINDINGS  Left Ventricle: Left ventricular ejection fraction, by estimation, is 65 to 70%. The left ventricle has normal function. The left ventricle has no regional wall motion abnormalities. The left ventricular internal cavity size was normal in size. There is  mild left ventricular hypertrophy. Left ventricular diastolic parameters are consistent with Grade I diastolic dysfunction (impaired relaxation). Right Ventricle: The right ventricular size is normal. No increase in right ventricular wall thickness. Right ventricular systolic  function is normal. Left Atrium: Left atrial size was normal in size. Right Atrium: Right atrial size was normal in size. Pericardium: There is no evidence of pericardial effusion. Mitral Valve: The mitral valve is normal in structure. Trivial mitral valve regurgitation. Tricuspid Valve: The tricuspid valve is normal in structure. Tricuspid valve regurgitation is trivial. Aortic Valve: The aortic valve is normal in structure. Aortic valve regurgitation is not visualized. Aortic valve mean gradient measures 4.5 mmHg. Aortic valve peak gradient measures 8.2 mmHg. Aortic valve area, by VTI measures 3.48 cm. Pulmonic Valve: The pulmonic valve was normal in structure. Pulmonic valve regurgitation is not visualized. Aorta: The ascending aorta was not well visualized. IAS/Shunts: No atrial level shunt detected by color flow Doppler.  LEFT VENTRICLE PLAX 2D LVIDd:         3.30 cm   Diastology LVIDs:         2.10 cm   LV e' medial:    4.68 cm/s LV PW:         1.20 cm   LV E/e' medial:  19.7 LV IVS:        1.20 cm   LV e' lateral:   13.30 cm/s LVOT diam:     2.00 cm   LV E/e'  lateral: 6.9 LV SV:         70 LV SV Index:   47 LVOT Area:     3.14 cm  RIGHT VENTRICLE RV Basal diam:  3.80 cm RV S prime:     13.60 cm/s TAPSE (M-mode): 1.6 cm LEFT ATRIUM             Index        RIGHT ATRIUM           Index LA diam:        2.10 cm 1.42 cm/m   RA Area:     13.80 cm LA Vol (A2C):   15.3 ml 10.35 ml/m  RA Volume:   35.30 ml  23.87 ml/m LA Vol (A4C):   22.2 ml 15.01 ml/m LA Biplane Vol: 19.6 ml 13.26 ml/m  AORTIC VALVE AV Area (Vmax):    2.65 cm AV Area (Vmean):   2.73 cm AV Area (VTI):     3.48 cm AV Vmax:           143.50 cm/s AV Vmean:          94.750 cm/s AV VTI:            0.202 m AV Peak Grad:      8.2 mmHg AV Mean Grad:      4.5 mmHg LVOT Vmax:         121.00 cm/s LVOT Vmean:        82.200 cm/s LVOT VTI:          0.223 m LVOT/AV VTI ratio: 1.11  AORTA Ao Root diam: 3.57 cm MITRAL VALVE                TRICUSPID VALVE MV Area (PHT): 3.63 cm     TR Peak grad:   40.4 mmHg MV Decel Time: 209 msec     TR Vmax:        318.00 cm/s MV E velocity: 92.10 cm/s MV A velocity: 102.00 cm/s  SHUNTS MV E/A ratio:  0.90         Systemic VTI:  0.22 m  Systemic Diam: 2.00 cm Yolonda Kida MD Electronically signed by Yolonda Kida MD Signature Date/Time: 03/13/2022/12:56:43 PM    Final    [place] EKG SITE RITE  Result Date: 03/13/2022 If Site Rite image not attached, placement could not be confirmed due to current cardiac rhythm.  DG HIP UNILAT WITH PELVIS 2-3 VIEWS LEFT  Result Date: 03/13/2022 CLINICAL DATA:  Left hip fracture. EXAM: DG HIP (WITH OR WITHOUT PELVIS) 2-3V LEFT COMPARISON:  CT yesterday. FINDINGS: Displaced left femoral neck fracture with likely underlying lytic lesion. Femoral head remains seated. There is proximal migration of the femoral shaft. The question pathologic fracture at the right puboacetabular junction is tentatively visualized. Many of the additional bone lesions on CT are not well seen by radiograph. There is excreted IV contrast in  the urinary bladder which appears trabeculated. IMPRESSION: Displaced left femoral neck fracture with likely underlying lytic lesion. Possible pathologic fracture at the right puboacetabular junction on CT is tentatively visualized. Many of the additional bone lesions are not well seen by radiograph. Electronically Signed   By: Keith Rake M.D.   On: 03/13/2022 01:03   CT CHEST ABDOMEN PELVIS W CONTRAST  Result Date: 03/12/2022 CLINICAL DATA:  Metastatic disease evaluation. Osseous metastatic disease of the thoracic and lumbar spine. EXAM: CT CHEST, ABDOMEN, AND PELVIS WITH CONTRAST TECHNIQUE: Multidetector CT imaging of the chest, abdomen and pelvis was performed following the standard protocol during bolus administration of intravenous contrast. RADIATION DOSE REDUCTION: This exam was performed according to the departmental dose-optimization program which includes automated exposure control, adjustment of the mA and/or kV according to patient size and/or use of iterative reconstruction technique. CONTRAST:  31m OMNIPAQUE IOHEXOL 300 MG/ML  SOLN COMPARISON:  Thoracic and lumbar spine MRI yesterday. Chest abdomen pelvis CT 12/06/2021 FINDINGS: CT CHEST FINDINGS Cardiovascular: Aortic atherosclerosis. Aortic tortuosity. The heart is normal in size. There are coronary artery calcifications. No pericardial effusion. Mediastinum/Nodes: Moderate-sized hiatal hernia. The esophagus is dilated and contains intraluminal debris to the level of the thoracic inlet. 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 The right thyroid gland may be surgically absent. No definite mediastinal or hilar adenopathy. Right axillary node measures 9 mm series 2, image 18. Lungs/Pleura: Small layering right pleural effusion. No convincing pleural nodularity or enhancement. Right lower lobe airspace disease is greater than typically seen with compressive atelectasis. Mild emphysema. Tiny 3 mm left  lower lobe nodule series 4, image 84. This was previously obscured. Previous calcified right lower lobe nodule is again seen, surrounded by adjacent atelectatic lung. Central bronchial thickening. There is layering debris/mucus within the distal trachea and right mainstem bronchus. Mild dependent atelectasis in the left lower lobe. Musculoskeletal: Multiple healing anterior rib fractures typically seen with CPR. Many of these demonstrate delayed union. Sternal body fracture may be pathologic with adjacent sclerosis. Known thoracic osseous metastatic disease was assessed on thoracic spine MRI yesterday. Pathologic T4 and T10 vertebral body compression fractures. Some of the lesions on MRI are visualized with lytic lesions, occasional areas of cortical sclerosis involving T3 and T4. There multiple small lucent rib lesions. The right subareolar breast tissue appears slightly lobulated, but is similar in appearance to prior exam. There is generalized body wall edema. Left shoulder arthroplasty. CT ABDOMEN PELVIS FINDINGS Hepatobiliary: Patient had numerous liver lesions on spine MRI. These lesions are difficult to visualize on the current exam, however there is a 2.8 cm lesion in the right hepatic dome. Multiple additional low-density lesions scattered  throughout the liver, difficult to assess due to phase of contrast. Some of these lesions were previously characterized as hemangioma on MRI 11/02/2021, however some of these lesions are definitely new. Prior cholecystectomy. Common bile duct measures 9 mm proximally, 10 mm distally. There is a 7 mm stone in the distal common bile duct, series 5, image 58. There is slight central intrahepatic biliary ductal dilatation. Pancreas: 7 mm low-density in the pancreatic head, also seen on prior MRI, characterized as cyst. No evidence of suspicious pancreatic lesion. There is slight proximal ductal prominence of 4 mm. No peripancreatic inflammation. Spleen: Normal in size without  focal abnormality. Adrenals/Urinary Tract: No adrenal nodule. Mild left renal atrophy. Tiny hypodensities in the renal cortices are too small to characterize but likely small cysts. No solid renal lesion. No hydronephrosis. No bladder wall thickening. No dense of bladder lesion. Stomach/Bowel: Bowel assessment is limited in the absence of enteric contrast. There is a moderate hiatal hernia. Mild gastric wall hyperemia. No small bowel obstruction or inflammation. Normal appendix. Multifocal colonic diverticulosis. Stool distends the rectum. Circumferential anorectal thickening again seen, series 2, image 107. Vascular/Lymphatic: Aortic atherosclerosis. No aortic aneurysm. No definite abdominopelvic adenopathy. Reproductive: Atrophic uterus is tentatively visualized. No evidence of adnexal mass. Other: There is generalized edema of the subcutaneous and intra-abdominal fat. No convincing abdominopelvic ascites. No omental thickening. Musculoskeletal: There is a displaced left femoral neck fracture, presumably pathologic. Possible pathologic fracture of the right superior puboacetabular junction innumerable osseous metastasis throughout the pelvis, multiple areas of low-density as well as trabecular coarsening. Lumbar metastatic disease better assessed on lumbar spine yesterday remote L5 compression fracture. IMPRESSION: 1. Osseous metastatic disease throughout the chest, abdomen, and pelvis, as described. 2. Multiple hepatic lesions which are poorly defined on the current exam due to phase of contrast, some of which are new from prior imaging and suspicious for metastatic disease. 3. Circumferential anorectal wall thickening, also seen on prior exam, recommend direct visualization. 4. Small layering right pleural effusion. Right lower lobe airspace disease is greater than typically seen with compressive atelectasis. This may be secondary to pneumonia, however aspiration is considered given there is layering debris in  the right mainstem bronchus. No obvious pulmonary mass. 5. Choledocholithiasis with 7 mm stone in the distal common bile duct. Mild biliary ductal dilatation. 6. Moderate hiatal hernia with dilated esophagus and intraluminal debris to the level of the thoracic inlet. 7. Colonic diverticulosis without diverticulitis. 8. 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 9. Displaced left femoral neck fracture, presumably pathologic. Possible pathologic fracture of the right superior puboacetabular junction, possible pathologic fracture of the sternum. 10. Generalized body wall edema. Aortic Atherosclerosis (ICD10-I70.0) and Emphysema (ICD10-J43.9). Electronically Signed   By: Keith Rake M.D.   On: 03/12/2022 22:10   DG Chest Portable 1 View  Result Date: 03/12/2022 CLINICAL DATA:  Shortness of breath. EXAM: PORTABLE CHEST 1 VIEW COMPARISON:  Chest radiograph dated 12/07/2021. FINDINGS: Small right pleural effusion and left lung base atelectasis or infiltrate. Probable left lung base atelectasis/scarring. No pneumothorax. The cardiac silhouette is within limits. No acute osseous pathology. Osteopenia. Left shoulder arthroplasty. IMPRESSION: Probable small right pleural effusion and right lung base atelectasis or infiltrate. Electronically Signed   By: Anner Crete M.D.   On: 03/12/2022 21:14   MR THORACIC SPINE WO CONTRAST  Result Date: 03/12/2022 CLINICAL DATA:  Mid and lower back pain. EXAM: MRI THORACIC AND LUMBAR SPINE WITHOUT CONTRAST TECHNIQUE: Multiplanar and multiecho pulse sequences of  the thoracic and lumbar spine were obtained without intravenous contrast. COMPARISON:  CT chest abdomen pelvis April 13, 23. MRI February 12, 23. FINDINGS: MRI THORACIC SPINE FINDINGS Alignment:  No substantial sagittal subluxation Vertebrae: Extensive/numerous T1 hypointense and STIR hyperintense lesions throughout the visualized lower cervical and thoracic spine, compatible with  osseous metastatic disease. Lesions involve both the vertebral bodies as well as the posterior elements. Complete replacement of the bone marrow at T3 and T4. Height loss of the T4 and T10 vertebral bodies is compatible with pathologic fractures, new since CT on April 13, 23. Cord:  Normal cord signal. Paraspinal and other soft tissues: Layering right pleural effusion. Suspected dilated, fluid-filled esophagus. Disc levels: Poor definition of bone marrow cortex at T3 and T4 where there is complete marrow replacement, suggesting possible extraosseous extension of tumor although evaluation is limited without contrast. MRI LUMBAR SPINE FINDINGS Segmentation:  Standard. Alignment: Grade 1 retrolisthesis of L1 on L2 and L2 on L3 and grade 1 anterolisthesis of L5 on S1. Vertebrae: Numerous T1 hypointense and STIR hyperintense lesions throughout the visualized lumbar spine and sacrum, concerning for osseous metastatic disease. Remote L5 compression fracture with similar height loss. Conus medullaris and cauda equina: Conus extends to the T12 level. Conus appears normal. Paraspinal and other soft tissues: Limited intra-abdominal evaluation due to motion/artifact. Numerous hepatic lesions on the coronal T2 image. Disc levels: T12-L1: No significant disc protrusion, foraminal stenosis, or canal stenosis. L1-L2: Posterior disc osteophyte complex and facet arthropathy. Moderate right foraminal stenosis. Right subarticular stenosis with otherwise patent canal. L2-L3: Disc height loss. Disc bulging and endplate spurring. Similar moderate right and mild left foraminal stenosis with out significant canal stenosis. L3-L4: Right eccentric disc bulging with bilateral facet arthropathy. Similar moderate to severe canal stenosis with severe bilateral subarticular recess stenosis. Similar mild-to-moderate right foraminal stenosis. L4-L5: Mild disc bulging bilateral facet arthropathy. Moderate left and mild right foraminal stenosis,  similar. L5-S1: Facet arthropathy and mild disc bulging. Mild left foraminal stenosis, similar. IMPRESSION: 1. Findings compatible with extensive osseous metastatic disease throughout the visualized cervical, thoracic, and lumbar spine, as well as the sacrum. 2. Pathologic fractures of T4 and T10, new since CT on December 06 2021. Similar height loss of an L5 compression fracture. 3. Poor definition of bone marrow cortex at T3 and T4 where there is complete marrow replacement, suggesting possible extraosseous extension of tumor although evaluation is limited without contrast. Postcontrast imaging could further characterize if clinically warranted. 4. Multilevel degenerative change, greatest in the lumbar spine with similar moderate to severe canal stenosis at L3-L4 and multilevel foraminal stenosis, detailed above. 5. Partially imaged numerous hepatic lesions, suspicious for metastatic disease given above findings. Also, layering right pleural effusion and suspected dilated fluid-filled esophagus. Recommend dedicated chest/abdominal/pelvic imaging to further evaluate disease burden and better evaluate for suspected primary malignancy. These results will be called to the ordering clinician or representative by the Radiologist Assistant, and communication documented in the PACS or [employer]. Electronically Signed   By: Margaretha Sheffield M.D.   On: 03/12/2022 13:57   MR LUMBAR SPINE WO CONTRAST  Result Date: 03/12/2022 CLINICAL DATA:  Mid and lower back pain. EXAM: MRI THORACIC AND LUMBAR SPINE WITHOUT CONTRAST TECHNIQUE: Multiplanar and multiecho pulse sequences of the thoracic and lumbar spine were obtained without intravenous contrast. COMPARISON:  CT chest abdomen pelvis April 13, 23. MRI February 12, 23. FINDINGS: MRI THORACIC SPINE FINDINGS Alignment:  No substantial sagittal subluxation Vertebrae: Extensive/numerous T1 hypointense and STIR hyperintense lesions  throughout the visualized lower cervical and  thoracic spine, compatible with osseous metastatic disease. Lesions involve both the vertebral bodies as well as the posterior elements. Complete replacement of the bone marrow at T3 and T4. Height loss of the T4 and T10 vertebral bodies is compatible with pathologic fractures, new since CT on April 13, 23. Cord:  Normal cord signal. Paraspinal and other soft tissues: Layering right pleural effusion. Suspected dilated, fluid-filled esophagus. Disc levels: Poor definition of bone marrow cortex at T3 and T4 where there is complete marrow replacement, suggesting possible extraosseous extension of tumor although evaluation is limited without contrast. MRI LUMBAR SPINE FINDINGS Segmentation:  Standard. Alignment: Grade 1 retrolisthesis of L1 on L2 and L2 on L3 and grade 1 anterolisthesis of L5 on S1. Vertebrae: Numerous T1 hypointense and STIR hyperintense lesions throughout the visualized lumbar spine and sacrum, concerning for osseous metastatic disease. Remote L5 compression fracture with similar height loss. Conus medullaris and cauda equina: Conus extends to the T12 level. Conus appears normal. Paraspinal and other soft tissues: Limited intra-abdominal evaluation due to motion/artifact. Numerous hepatic lesions on the coronal T2 image. Disc levels: T12-L1: No significant disc protrusion, foraminal stenosis, or canal stenosis. L1-L2: Posterior disc osteophyte complex and facet arthropathy. Moderate right foraminal stenosis. Right subarticular stenosis with otherwise patent canal. L2-L3: Disc height loss. Disc bulging and endplate spurring. Similar moderate right and mild left foraminal stenosis with out significant canal stenosis. L3-L4: Right eccentric disc bulging with bilateral facet arthropathy. Similar moderate to severe canal stenosis with severe bilateral subarticular recess stenosis. Similar mild-to-moderate right foraminal stenosis. L4-L5: Mild disc bulging bilateral facet arthropathy. Moderate left and mild  right foraminal stenosis, similar. L5-S1: Facet arthropathy and mild disc bulging. Mild left foraminal stenosis, similar. IMPRESSION: 1. Findings compatible with extensive osseous metastatic disease throughout the visualized cervical, thoracic, and lumbar spine, as well as the sacrum. 2. Pathologic fractures of T4 and T10, new since CT on December 06 2021. Similar height loss of an L5 compression fracture. 3. Poor definition of bone marrow cortex at T3 and T4 where there is complete marrow replacement, suggesting possible extraosseous extension of tumor although evaluation is limited without contrast. Postcontrast imaging could further characterize if clinically warranted. 4. Multilevel degenerative change, greatest in the lumbar spine with similar moderate to severe canal stenosis at L3-L4 and multilevel foraminal stenosis, detailed above. 5. Partially imaged numerous hepatic lesions, suspicious for metastatic disease given above findings. Also, layering right pleural effusion and suspected dilated fluid-filled esophagus. Recommend dedicated chest/abdominal/pelvic imaging to further evaluate disease burden and better evaluate for suspected primary malignancy. These results will be called to the ordering clinician or representative by the Radiologist Assistant, and communication documented in the PACS or [employer]. Electronically Signed   By: Margaretha Sheffield M.D.   On: 03/12/2022 13:57    PERFORMANCE STATUS (ECOG) : 4 - Bedbound  Review of Systems Unless otherwise noted, a complete review of systems is negative.  Physical Exam General: Frail appearing Pulmonary: On O2 Abdomen: soft, nontender, + bowel sounds GU: no suprapubic tenderness Extremities: no edema, no joint deformities Skin: no rashes Neurological: Weakness but otherwise nonfocal  IMPRESSION: Met with patient and sister.  Patient was somewhat drowsy during our conversation.  She deferred to sister for decision-making.  Sister says  that patient has decided to forego further work-up/treatment.  She is not interested in pursuing any cancer treatments regardless of the ultimate results of biopsy.  Patient has minimal oral intake.  She is essentially  bedbound.  She is now on O2 at 3 L.  She has been symptomatic with ongoing pain.  Sister says that patient and family would like to keep her as comfortable as possible until her end-of-life.  They are interested in pursuing care at the [facility] if possible.   I did speak with Dr. Antonieta Loveless, hospice physician.  We will involve the hospice liaison to help coordinate transfer to hospice IPU.  PLAN: -Hospice IPU when a bed is available   Time Total: 25 minutes  Visit consisted of counseling and education dealing with the complex and emotionally intense issues of symptom management and palliative care in the setting of serious and potentially life-threatening illness.Greater than 50%  of this time was spent counseling and coordinating care related to the above assessment and plan.  Signed by: Altha Harm, PhD, NP-C

## 2022-03-18 NOTE — Assessment & Plan Note (Signed)
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 Encouraged po intake as desired.

## 2022-03-18 NOTE — Progress Notes (Signed)
[place] for heparin infusion Indication: atrial fibrillation  Allergies  Allergen Reactions   Codeine Hives   Methotrexate Nausea Only   Remicade [Infliximab] Other (See Comments)    Chest felt heavy, was given benadryl.    Patient Measurements: Height: '5\' 1"'$  (154.9 cm) Weight: 51 kg (112 lb 7 oz) IBW/kg (Calculated) : 47.8 Heparin Dosing Weight: 51 kg  Vital Signs: Temp: 97.4 F (36.3 C) (07/24 0031) Pulse Rate: 82 (07/23 1815)  Labs: Recent Labs    03/15/22 0445 03/15/22 1800 03/15/22 2020 03/16/22 0330 03/16/22 1100 03/17/22 0633 03/17/22 0845 03/17/22 1800 03/18/22 0335  HGB 7.3* 12.2  --  11.4*  --  11.0*  --   --   --   HCT 22.2* 36.1  --  33.4*  --  32.4*  --   --   --   PLT 289  --   --  278  --  243  --   --   --   APTT  --   --  37*  --   --   --   --   --   --   LABPROT  --   --  13.4  --   --   --   --   --   --   INR  --   --  1.0  --   --   --   --   --   --   HEPARINUNFRC  --   --   --  0.70   < > >1.10* >1.10* 0.79* 0.13*  CREATININE 0.52  --   --  0.93  --  1.32*  --   --  1.19*  TROPONINIHS  --  19* 23*  --   --   --   --   --   --    < > = values in this interval not displayed.   Heparin Dosing Weight: 51 kg  Estimated Creatinine Clearance: 33.7 mL/min (A) (by C-G formula based on SCr of 1.19 mg/dL (H)).  Medical History: Past Medical History:  Diagnosis Date   Anginal pain ([place])    Anxiety    Arthritis    RA   Cancer ([place])    Complication of anesthesia 12/06/2021   PEA Cardiac Arrest with ROSC after 1 round of chest compressions and 1 mg epinephrine. Infiltrated IV found after rescusitative attempts were made.   Coronary artery disease    GERD (gastroesophageal reflux disease)    Hypertension    Pneumonia    Medications: Heparin Dosing Weight: 51 kg Pt initially ordered Enoxaparin 40 mg and given 1 dose 7/21 @ 0821; then transitioned to heparin gtt.  Assessment: Pt is a 71 yo female  admitted 03/12/22 that developed AFib, confirmed by EKG, this evening.  Goal of Therapy:  Heparin level 0.3-0.7 units/ml Monitor platelets by anticoagulation protocol: Yes    Date/time HL/aPTT Comments 7/22'@0330'$  HL 0.70 Therapeutic, adj 750 un/hr to 700 un/hr 7/22'@1100'$  HL 0.55 Therapeutic @ 700 un/hr  7/22'@1700'$  HL > 1.10  In error , repeat lab 7/22'@1950'$  HL 0.50 Therapeutic x 2, continue at 700 un/hr 7/23'@0633'$  HL > 1.10 In error?, repeat lab to confirm 7/23'@0845'$  HL > 1.10 On repeat elevated; 700 > 550un/hr 7/23'@1800'$  HL 0.79 Slightly above goal at 550 un/hr  7/24'@0335'$  HL=0.13 Subtherapeutic increase from 500 units/hr to 600 units/hr   Plan:  No boluses per MD, "closely watch Hgb". 7/24'@0335'$  HL=0.13  Subtherapeutic  Increase heparin  infusion rate to 600 units/hr Re-check HL in 8 hrs to confirm rate change CBC daily while on heparin  Chinita Greenland PharmD Clinical Pharmacist 03/18/2022

## 2022-03-18 NOTE — Telephone Encounter (Signed)
Pharmacy Patient Advocate Encounter  Insurance verification completed.    The patient is insured through [employer]   The patient is currently admitted and ran test claims for the following: Eliquis, Xarelto.  Copays and coinsurance results were relayed to Inpatient clinical team.

## 2022-03-18 NOTE — Progress Notes (Signed)
PT Cancellation Note  Patient Details Name: Kathleen Blankenship MRN: [id number] DOB: Jul 08, 1951   Cancelled Treatment:    Reason Eval/Treat Not Completed:  (patient refused due to not feeling well today. she is agreeable for PT to return tomorrow)  Minna Merritts, PT, MPT  Percell Locus 03/18/2022, 1:55 PM

## 2022-03-18 NOTE — Progress Notes (Signed)
[facility] Progress Note  Patient: Kathleen Blankenship    KTG:[id number]  DOA: 03/12/2022    Date of Service: the patient was seen and examined on 03/18/2022  Brief hospital course: 71 year old female with history of CAD, hypertension, PEA cardiac arrest postoperatively after lap chole on 11/2011, and ongoing tobacco use presented to the emergency department on 7/18 with progressively worsening back and left hip pain for several weeks causing difficulty to ambulate.  MRI done revealed extensive osseous metastatic disease with CT lumbar spine noting pathologic fractures at T4 and T10.  Also found to have a displaced left femoral neck pathologic fracture.  Lab work noteworthy for sodium of 130, albumin of 2.5.  Orthopedics and oncology consulted.  Course complicated by rapid atrial fibrillation that started on evening of 7/21.  Patient placed on a Cardizem and heparin drip. Afib now rate controlled.  Palliative care saw patient 03/18/2022, patient is opting for hospice facility and plan is for discharge tomorrow if bed/transport available.  Discontinued heparin drip, started on Eliquis.  Assessment and Plan:  * 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  Seen by orthopedic surgery and underwent a left hemiarthroplasty on 7/20.  Surgical pathology pending.  Seen by PT and OT who are recommending home health.  Metastatic cancer to bone [facility]) Oncology following, suspicious for likely primary from breast.  Pathology from left femur fracture repair pending. -Breast tumor markers (CA 27-29, CEA 15-3) ordered per oncology noted to be elevated.  Palliative care consulted.  Patient has established as a DNR.  For now, primary focus is on pain control. -pt to hospice, does not desire aggressive treatment for cancer   AKI (acute kidney injury) ([place]) Creatinine increased to 1.32 with GFR 44 on 7/23.  Urine output has declined.  Have started some IV fluids. Cr better  today  Atrial fibrillation with RVR ([place]) Started 7/21 evening.  Initially started on IV Cardizem.  Changed over to p.o. Cardizem and heart rate staying under control.  heparin drip --.> eliquis 07/24.  Cardiology following.  PNA (pneumonia) - Patient with history of rheumatoid arthritis on Rinvoq and leflunomide with concerns for metastatic Pathologic Fracture. -CT chest with small layering right pleural effusion, right lower lobe airspace disease greater than typically seen with compressive atelectasis may be secondary to pneumonia however aspiration is considered given layering debris in the right mainstem bronchus.  No obvious pulmonary mass. -Patient also with moderate hiatal hernia with dilated esophagus and intraluminal debris to the level of the thoracic inlet noted. -Urine strep pneumococcus antigen negative.  Urine Legionella antigen negative -Continue IV Zosyn, Mucinex.    Pleural effusion on right - This could be associated with right lower lobe airspace disease/pneumonia. - She does have cough however without fever or leukocytosis and she is a chronic smoker. -Patient with concern for metastatic disease, pathologic fracture, recent weight loss, noted to be on [place], [place] and likely immunocompromised and as such patient placed empirically on IV Zosyn for probable pneumonia.   -Continue to hold [place].  -Supportive care  UTI (urinary tract infection) - Urinalysis concerning for UTI with moderate leukocytes, positive nitrite, many bacteria.  Urine cultures with multiple species.  Currently on IV Zosyn, completed on 7/24 for 7 days.  Compression fracture of body of thoracic vertebra (HCC) - Noted at T4, T10 likely pathologic in nature versus secondary to osteoporosis. -IR consulted for evaluation for kyphoplasty - pt decides to defer for now since pain is controlled, will  follow outpatient as needed.  Protein-calorie malnutrition, severe Nutrition Status: Nutrition Problem:  Severe Malnutrition Etiology: chronic illness (metastatic bone disease) Signs/Symptoms: moderate fat depletion, severe fat depletion, moderate muscle depletion, severe muscle depletion Interventions: MVI, [place] Instant Breakfast     Chronic diastolic CHF (congestive heart failure) (HCC) Appears more dry than euvolemic.  Echocardiogram on 7/19 noted grade 1 diastolic dysfunction.   Pressure injury of skin Pressure Injury 03/13/22 Perineum Bilateral Stage 2 -  Partial thickness loss of dermis presenting as a shallow open injury with a red, pink wound bed without slough. (Active)  03/13/22 0130  Location: Perineum  Location Orientation: Bilateral  Staging: Stage 2 -  Partial thickness loss of dermis presenting as a shallow open injury with a red, pink wound bed without slough.  Wound Description (Comments):   Present on Admission: Yes    Stage II perineal ulcer, present on admission.  Wound care per nursing.  Anemia No signs of bleeding.  Consistent with anemia of chronic disease.  Received 1 unit packed red blood cell transfusion 7/20 and then 2 more units given prior to surgery after hemoglobin dropped to 7.3.  Postsurgery, no significant drop.  Hemoglobin at 11.4.  Continue to follow.  Essential hypertension -Continue home regimen Toprol-XL, Cozaar.  Dyslipidemia - Continue statin.  Rheumatoid arthritis ([place]) - The patient is on Rinvoq and leflunomide. -Leflunomide held, Outpatient follow-up.  GERD (gastroesophageal reflux disease) Continue PPI therapy.  Anxiety and depression As needed Ativan.  Continue home Zoloft.  Choledocholithiasis Incidental finding.  Asymptomatic.  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 Encouraged po intake as desired.        Body mass index is 21.24 kg/m.  Nutrition Problem: Severe  Malnutrition Etiology: chronic illness (metastatic bone disease) Pressure Injury 03/13/22 Perineum Bilateral Stage 2 -  Partial thickness loss of dermis presenting as a shallow open injury with a red, pink wound bed without slough. (Active)  03/13/22 0130  Location: Perineum  Location Orientation: Bilateral  Staging: Stage 2 -  Partial thickness loss of dermis presenting as a shallow open injury with a red, pink wound bed without slough.  Wound Description (Comments):   Present on Admission: Yes  Dressing Type Foam - Lift dressing to assess site every shift 03/17/22 2254         Consultants: Oncology Cardiology for preop clearance Interventional radiology Palliative care Orthopedic surgery  Procedures: Status post left hip hemiarthroplasty 7/20 Transfusion packed red blood cells x3 Echocardiogram 0/25: Grade 1 diastolic dysfunction  Antimicrobials: IV Zosyn 7/18 -7/24  Code Status: DNR   Subjective: Still having some episodes of pain.  Feels very tired.  Objective: Vital signs were reviewed and unremarkable. Vitals:   03/18/22 0415 03/18/22 0859  BP:  (!) 146/100  Pulse:  96  Resp:  11  Temp: 97.8 F (36.6 C) 97.8 F (36.6 C)  SpO2:  97%    Intake/Output Summary (Last 24 hours) at 03/18/2022 1629 Last data filed at 03/18/2022 1039 Gross per 24 hour  Intake 197.22 ml  Output --  Net 197.22 ml   Filed Weights   03/12/22 2027 03/14/22 0957  Weight: 51 kg 51 kg   Body mass index is 21.24 kg/m.  Exam:  General: Alert and oriented x3, no acute distress, fatigued HEENT: Normocephalic and atraumatic, mucous membranes are moist Cardiovascular: Irregular rhythm, rate controlled Respiratory: Decreased breath sounds  on right side  Abdomen: Soft, nontender, nondistended, positive bowel sounds Musculoskeletal: No clubbing or cyanosis, trace pitting edema Skin: No skin breaks, tears or lesions other than site of surgery Psychiatry: Appropriate, no evidence of  psychoses Neurology: No focal deficits  Data Reviewed: Sodium of 128 --> 124, creatinine increased to 1.32 with GFR of 44.  Hemoglobin stable.  Disposition:  Status is: Inpatient Remains inpatient appropriate because:  -Pain control. -Pathology from surgery    Anticipated discharge date: 7/25  Family Communication: Sister and friend at bedside on rounds  DVT Prophylaxis: SCDs Start: 03/14/22 1512 Place TED hose Start: 03/14/22 1512 apixaban (ELIQUIS) tablet 5 mg   Heparin drip   Author: Emeterio Reeve ,MD 03/18/2022 4:29 PM  To reach On-call, see care teams to locate the attending and reach out via [email or web address]. Between 7PM-7AM, please contact night-coverage If you still have difficulty reaching the attending provider, please page the [facility] (Director on Call) for [facility] on amion for assistance.

## 2022-03-18 NOTE — TOC Benefit Eligibility Note (Signed)
Patient Teacher, English as a foreign language completed.    The patient is currently admitted and upon discharge could be taking Eliquis 5 mg.  The current 30 day co-pay is, $47.00.   The patient is currently admitted and upon discharge could be taking Xarelto 20 mg.  The current 30 day co-pay is, $47.00.   The patient is insured through [place], [place] Patient Advocate Specialist [place] Patient Advocate Team Direct Number: [phone]  Fax: [phone]

## 2022-03-18 NOTE — Care Management Important Message (Signed)
Important Message  Patient Details  Name: Kathleen Blankenship MRN: [id number] Date of Birth: 1951-03-02   Medicare Important Message Given:  Yes     Dannette Barbara 03/18/2022, 11:44 AM

## 2022-03-18 NOTE — Progress Notes (Signed)
Subjective:  POD #4 s/p left hip hemiarthroplasty.   Patient reports left hip pain as mild to moderate.  Patient's family is in the room.  Patient is elected to forego any further cancer treatment or work-up regardless of the pending pathology results from surgical specimens.  Patient and her family have decided to go to hospice home when a room is available.  Patient did not get up with physical therapy today.  She states this is mainly from fatigue and not pain.  Objective:   VITALS:   Vitals:   03/17/22 2025 03/18/22 0031 03/18/22 0415 03/18/22 0859  BP:    (!) 146/100  Pulse:    96  Resp:    11  Temp: 98.9 F (37.2 C) (!) 97.4 F (36.3 C) 97.8 F (36.6 C) 97.8 F (36.6 C)  TempSrc:    Oral  SpO2:    97%  Weight:      Height:        PHYSICAL EXAM: Left lower extremity: No significant swelling.  No erythema or ecchymosis over the left hip Neurovascular intact Sensation intact distally Intact pulses distally Dorsiflexion/Plantar flexion intact Aquacel dressing: Changed yesterday.  Patient has moderate sanguinous drainage on her dressing today. No cellulitis present Thigh compartments soft and compressible  LABS  Results for orders placed or performed during the hospital encounter of 03/12/22 (from the past 24 hour(s))  Heparin level (unfractionated)     Status: Abnormal   Collection Time: 03/17/22  6:00 PM  Result Value Ref Range   Heparin Unfractionated 0.79 (H) 0.30 - 0.70 IU/mL  Heparin level (unfractionated)     Status: Abnormal   Collection Time: 03/18/22  3:35 AM  Result Value Ref Range   Heparin Unfractionated 0.13 (L) 0.30 - 0.70 IU/mL  Basic metabolic panel     Status: Abnormal   Collection Time: 03/18/22  3:35 AM  Result Value Ref Range   Sodium 124 (L) 135 - 145 mmol/L   Potassium 3.9 3.5 - 5.1 mmol/L   Chloride 93 (L) 98 - 111 mmol/L   CO2 20 (L) 22 - 32 mmol/L   Glucose, Bld 94 70 - 99 mg/dL   BUN 26 (H) 8 - 23 mg/dL   Creatinine, Ser 1.19 (H)  0.44 - 1.00 mg/dL   Calcium 7.5 (L) 8.9 - 10.3 mg/dL   GFR, Estimated 49 (L) >60 mL/min   Anion gap 11 5 - 15  Osmolality, urine     Status: None   Collection Time: 03/18/22  7:24 AM  Result Value Ref Range   Osmolality, Ur 345 300 - 900 mOsm/kg  Sodium, urine, random     Status: None   Collection Time: 03/18/22  7:24 AM  Result Value Ref Range   Sodium, Ur <10 mmol/L  Creatinine, urine, random     Status: None   Collection Time: 03/18/22  7:24 AM  Result Value Ref Range   Creatinine, Urine 87 mg/dL  Heparin level (unfractionated)     Status: Abnormal   Collection Time: 03/18/22 12:15 PM  Result Value Ref Range   Heparin Unfractionated 0.15 (L) 0.30 - 0.70 IU/mL  Osmolality     Status: Abnormal   Collection Time: 03/18/22 12:15 PM  Result Value Ref Range   Osmolality 271 (L) 275 - 295 mOsm/kg    No results found.  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 Active Problems:   Rheumatoid arthritis (HCC)   Choledocholithiasis  Dyslipidemia   Essential hypertension   Anxiety and depression   GERD (gastroesophageal reflux disease)   Pleural effusion on right   Pressure injury of skin   PNA (pneumonia)   Protein-calorie malnutrition, severe   Metastatic cancer to bone (HCC)   Anemia   UTI (urinary tract infection)   Compression fracture of body of thoracic vertebra (HCC)   Atrial fibrillation with RVR (HCC)   AKI (acute kidney injury) ([place])   Chronic diastolic CHF (congestive heart failure) ([place])   Palliative care encounter   Hyponatremia  Patient is stable from an orthopedic standpoint.  Patient and her sister state that her left hip pain has improved following surgery.  Patient states overall she does not feel well and feels fatigued which is impacting her ability to participate with physical therapy.  Patient has stopped the heparin drip and is now on Eliquis.  This should help slow bleeding from the  surgical site.  Patient is awaiting placement in the hospice home when a bed is available.  She may follow-up in our office in 10 to 14 days for wound check and staple removal if appropriate.    Thornton Park , MD 03/18/2022, 5:58 PM

## 2022-03-18 NOTE — Consult Note (Signed)
IR received request for Osteocool (bone biopsy, tumor ablation and kyphoplasty) procedure given new findings of T4 and T10 pathologic compression fractures. The patient was seen today and denies any current back pain. Her only pain complaint at this moment is her recent hip surgery related pain. The patient and her family would like to hold off on the procedure and if she develops back pain will reach out for outpatient work up. The ordering team was made aware.   Hedy Jacob, PA-C 03/18/2022, 1:08 PM

## 2022-03-19 ENCOUNTER — Encounter: Payer: Medicare HMO | Admitting: Physical Therapy

## 2022-03-19 DIAGNOSIS — M8450XA Pathological fracture in neoplastic disease, unspecified site, initial encounter for fracture: Secondary | ICD-10-CM | POA: Diagnosis not present

## 2022-03-19 LAB — AEROBIC/ANAEROBIC CULTURE W GRAM STAIN (SURGICAL/DEEP WOUND)
Culture: NO GROWTH
Culture: NO GROWTH
Gram Stain: NONE SEEN

## 2022-03-19 LAB — BASIC METABOLIC PANEL
Anion gap: 9 (ref 5–15)
BUN: 21 mg/dL (ref 8–23)
CO2: 20 mmol/L — ABNORMAL LOW (ref 22–32)
Calcium: 7.9 mg/dL — ABNORMAL LOW (ref 8.9–10.3)
Chloride: 100 mmol/L (ref 98–111)
Creatinine, Ser: 0.71 mg/dL (ref 0.44–1.00)
GFR, Estimated: >60 mL/min (ref >60)
Glucose, Bld: 111 mg/dL — ABNORMAL HIGH (ref 70–99)
Potassium: 3.9 mmol/L (ref 3.5–5.1)
Sodium: 129 mmol/L — ABNORMAL LOW (ref 135–145)

## 2022-03-19 LAB — CBC
HCT: 33.8 % — ABNORMAL LOW (ref 36.0–46.0)
Hemoglobin: 11.5 g/dL — ABNORMAL LOW (ref 12.0–15.0)
MCH: 29.9 pg (ref 26.0–34.0)
MCHC: 34 g/dL (ref 30.0–36.0)
MCV: 88 fL (ref 80.0–100.0)
Platelets: 254 10*3/uL (ref 150–400)
RBC: 3.84 MIL/uL — ABNORMAL LOW (ref 3.87–5.11)
RDW: 17.2 % — ABNORMAL HIGH (ref 11.5–15.5)
WBC: 9 10*3/uL (ref 4.0–10.5)
nRBC: 0.6 % — ABNORMAL HIGH (ref 0.0–0.2)

## 2022-03-19 LAB — SURGICAL PATHOLOGY

## 2022-03-19 MED ORDER — GABAPENTIN 300 MG PO CAPS: 300.0000 mg | ORAL_CAPSULE | Freq: Three times a day (TID) | ORAL | Status: AC | PRN

## 2022-03-19 MED ORDER — DILTIAZEM HCL 60 MG PO TABS: 60.0000 mg | ORAL_TABLET | Freq: Four times a day (QID) | ORAL | 0 refills | Status: AC

## 2022-03-19 MED ORDER — APIXABAN 5 MG PO TABS
5.0000 mg | ORAL_TABLET | Freq: Two times a day (BID) | ORAL | 0 refills | Status: AC
Start: 2022-03-19 — End: ?

## 2022-03-19 NOTE — Discharge Summary (Signed)
Physician Discharge Summary   Patient: Kathleen Blankenship MRN: [id number]  DOB: 1951/06/07   Admit:     Date of Admission: 03/12/2022 Admitted from: home   Discharge: Date of discharge: 03/19/22 Disposition: Hospice care Condition at discharge: poor  CODE STATUS: DNR   Diet recommendation: Regular diet as tolerated / as desired    Discharge Physician: Emeterio Reeve, DO [facility]     PCP: Tracie Harrier, MD  Recommendations for Outpatient Follow-up:  Management pain/symptoms per hospice care    Discharge Instructions     Diet general   Complete by: As directed    Discharge wound care:   Complete by: As directed    Per facility protocol   Increase activity slowly   Complete by: As directed    As tolerated / as desired          Brief/Interim Summary: 71 year old female with history of CAD, hypertension, PEA cardiac arrest postoperatively after lap chole on 11/2011, and ongoing tobacco use presented to the emergency department on 7/18 with progressively worsening back and left hip pain for several weeks causing difficulty to ambulate.  MRI done revealed extensive osseous metastatic disease with CT lumbar spine noting pathologic fractures at T4 and T10.  Also found to have a displaced left femoral neck pathologic fracture.  Lab work noteworthy for sodium of 130, albumin of 2.5.  Orthopedics and oncology consulted.  Course complicated by rapid atrial fibrillation that started on evening of 7/21.  Patient placed on a Cardizem and heparin drip. Afib now rate controlled.  Palliative care saw patient 03/18/2022, patient is opting for hospice facility and plan is for discharge tomorrow if bed/transport available.  Discontinued heparin drip, started on Eliquis. Stable for transport to hospice - medications continued to control BP and HR, pain and anxiety control and other palliative measures per hospice, continued Eliquis to prevent CVA, stopped several other chronic  medications per patient preference.   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 Active Problems:   Metastatic cancer to bone [facility])   Atrial fibrillation with RVR (HCC)   AKI (acute kidney injury) ([place])   Pleural effusion on right   PNA (pneumonia)   UTI (urinary tract infection)   Compression fracture of body of thoracic vertebra (HCC)   Protein-calorie malnutrition, severe   Pressure injury of skin   Chronic diastolic CHF (congestive heart failure) (HCC)   Essential hypertension   Anemia   Rheumatoid arthritis (HCC)   Dyslipidemia   Anxiety and depression   GERD (gastroesophageal reflux disease)   Choledocholithiasis   Palliative care encounter   Hyponatremia    Assessment & Plan:  * 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  Seen by orthopedic surgery and underwent a left hemiarthroplasty on 7/20.  Surgical pathology pending, likely met breast CA and pt does not desire treatment.    Metastatic cancer to bone [facility]) Oncology following, suspicious for likely primary from breast.  Pathology from left femur fracture repair pending. -Breast tumor markers (CA 27-29, CEA 15-3) ordered per oncology noted to be elevated.  Palliative care consulted.  Patient has established as a DNR.  For now, primary focus is on pain control. -pt to  hospice, does not desire aggressive treatment for cancer    AKI (acute kidney injury) ([place]) Meds/labs d/c    Atrial fibrillation with RVR ([place]) Started 7/21 evening.  Initially started on IV Cardizem.  Changed over to p.o. Cardizem and heart rate staying under control.  heparin  drip --.> eliquis 07/24.     PNA (pneumonia) - Patient with history of rheumatoid arthritis on Rinvoq and leflunomide with concerns for metastatic Pathologic Fracture. -CT chest with small layering right pleural effusion, right lower lobe airspace disease greater than typically seen with compressive atelectasis may be secondary to pneumonia however aspiration is considered given layering debris in the right mainstem bronchus.  No obvious pulmonary mass. -Patient also with moderate hiatal hernia with dilated esophagus and intraluminal debris to the level of the thoracic inlet noted. -Urine strep pneumococcus antigen negative.  Urine Legionella antigen negative -Completed IV Zosyn, Mucinex.     Pleural effusion on right - This could be associated with right lower lobe airspace disease/pneumonia. - She does have cough however without fever or leukocytosis and she is a chronic smoker. -Patient with concern for metastatic disease, pathologic fracture, recent weight loss, noted to be on [place], [place] and likely immunocompromised and as such patient placed empirically on IV Zosyn for probable pneumonia.   -Continue to hold [place]rav[place].  -Supportive care   UTI (urinary tract infection) - Urinalysis concerning for UTI with moderate leukocytes, positive nitrite, many bacteria.  Urine cultures with multiple species.  Currently on IV Zosyn, completed on 7/24 x 7 days.   Compression fracture of body of thoracic vertebra (HCC) - Noted at T4, T10 likely pathologic in nature versus secondary to osteoporosis. -IR consulted for evaluation for kyphoplasty - pt decides to defer for now since pain is controlled, will follow outpatient as needed.   Protein-calorie malnutrition, severe Nutrition Status: Nutrition Problem: Severe Malnutrition Etiology: chronic illness (metastatic bone disease) Signs/Symptoms: moderate fat depletion, severe fat depletion, moderate muscle depletion, severe muscle  depletion Interventions: MVI, Carn[employer] Instant Breakfast    Chronic diastolic CHF (congestive heart failure) (HCC) Appears more dry than euvolemic.  Echocardiogram on 7/19 noted grade 1 diastolic dysfunction.    Pressure injury of skin Pressure Injury 03/13/22 Perineum Bilateral Stage 2 -  Partial thickness loss of dermis presenting as a shallow open injury with a red, pink wound bed without slough. (Active)  03/13/22 0130  Location: Perineum  Location Orientation: Bilateral  Staging: Stage 2 -  Partial thickness loss of dermis presenting as a shallow open injury with a red, pink wound bed without slough.  Wound Description (Comments):   Present on Admission: Yes  Stage II perineal ulcer, present on admission.  Wound care per nursing.   Anemia No signs of bleeding.  Consistent with anemia of chronic disease.  Received 1 unit packed red blood cell transfusion 7/20 and then 2 more units given prior to surgery after hemoglobin dropped to 7.3.  Postsurgery, no significant drop.  Hemoglobin at 11.4.  FOllow as needed. Stop eliquis if s/s bleeding    Essential hypertension -Continue home regimen Toprol-XL. ALso on cardizem for rate control    Dyslipidemia - stopped statin.   Rheumatoid arthritis ([place]) - The patient is on Rinvoq and leflunomide, held on hospice    GERD (gastroesophageal reflux disease) Continue PPI therapy.   Anxiety and depression As needed Ativan.  Continue home Zoloft. Acute symptom management per hospice    Choledocholithiasis Incidental finding.  Asymptomatic.   Hyponatremia - labs pending but likely  SIADH / poor po intake, discussed w/ patient implications of low sodium but she is not interested in aggressive workup or treatment. Encouraged po intake as desired.    Body mass index is 21.24 kg/m.  Nutrition Problem: Severe Malnutrition Etiology: chronic illness (metastatic bone disease)     Discharge Instructions  Allergies as of 03/19/2022        Reactions   Codeine Hives   Methotrexate Nausea Only   Remicade [infliximab] Other (See Comments)   Chest felt heavy, was given benadryl.        Medication List     STOP taking these medications    alendronate 70 MG tablet Commonly known as: FOSAMAX   atorvastatin 10 MG tablet Commonly known as: LIPITOR   leflunomide 10 MG tablet Commonly known as: ARAVA   Rinvoq 15 MG Tb24 Generic drug: Upadacitinib ER   Vitamin D 125 MCG (5000 UT) Caps       TAKE these medications    albuterol 108 (90 Base) MCG/ACT inhaler Commonly known as: VENTOLIN HFA Inhale 2 puffs into the lungs every 6 (six) hours as needed for wheezing or shortness of breath.   apixaban 5 MG Tabs tablet Commonly known as: ELIQUIS Take 1 tablet (5 mg total) by mouth 2 (two) times daily. STOP for s/s bleeding   cyclobenzaprine 10 MG tablet Commonly known as: FLEXERIL Take 5-10 mg by mouth at bedtime as needed.   diazepam 5 MG tablet Commonly known as: VALIUM SMARTSIG:1 Tablet(s) By Mouth   diltiazem 60 MG tablet Commonly known as: CARDIZEM Take 1 tablet (60 mg total) by mouth every 6 (six) hours.   gabapentin 300 MG capsule Commonly known as: NEURONTIN Take 1 capsule (300 mg total) by mouth 3 (three) times daily as needed (muscle spasm / neuropathy). What changed:  when to take this reasons to take this   HYDROcodone-acetaminophen 7.5-325 MG tablet Commonly known as: NORCO Take 1 tablet by mouth 2 (two) times daily as needed.   losartan 50 MG tablet Commonly known as: COZAAR Take 50 mg by mouth daily.   metoprolol succinate 50 MG 24 hr tablet Commonly known as: TOPROL-XL Take 50 mg by mouth daily.   pantoprazole 40 MG tablet Commonly known as: PROTONIX Take 40 mg by mouth daily.   sertraline 50 MG tablet Commonly known as: ZOLOFT Take 50 mg by mouth at bedtime.   traMADol 50 MG tablet Commonly known as: ULTRAM Take 1 tablet (50 mg total) by mouth every 6 (six) hours as needed  for moderate pain or severe pain (for pain not controlled by tylenol).               Discharge Care Instructions  (From admission, onward)           Start     Ordered   03/19/22 0000  Discharge wound care:       Comments: Per facility protocol   03/19/22 1047              Allergies  Allergen Reactions   Codeine Hives   Methotrexate Nausea Only   Remicade [Infliximab] Other (See Comments)    Chest felt heavy, was given benadryl.     Subjective: Pt feeling tired today, friend speaks for her, NO complaints or questions, pain is controlled.    Discharge Exam: Vitals:   03/19/22 0400 03/19/22 0800  BP: 135/80 136/72  Pulse: 88 97  Resp: 18 (!) 8  Temp: 97.7 F (36.5 C) 97.7 F (36.5  C)  SpO2: 95% 98%   Vitals:   03/18/22 0859 03/19/22 0017 03/19/22 0400 03/19/22 0800  BP: (!) 146/100 (!) 125/98 135/80 136/72  Pulse: 96 95 88 97  Resp: _0 (!) 8  Temp: 97.8 F (36.6 C) 98 F (36.7 C) 97.7 F (36.5 C) 97.7 F (36.5 C)  TempSrc: Oral Oral Oral Oral  SpO2: 97% 93% 95% 98%  Weight:      Height:         General: Pt is alert, awake, not in acute distress Cardiovascular: RRR Respiratory: normal respiratory effort Extremities: no edema, no cyanosis     The results of significant diagnostics from this hospitalization (including imaging, microbiology, ancillary and laboratory) are listed below for reference.     Microbiology: Recent Results (from the past 240 hour(s))  Blood culture (routine x 2)     Status: None   Collection Time: 03/12/22  8:25 PM   Specimen: BLOOD  Result Value Ref Range Status   Specimen Description BLOOD BLOOD LEFT FOREARM  Final   Special Requests   Final    BOTTLES DRAWN AEROBIC AND ANAEROBIC Blood Culture results may not be optimal due to an inadequate volume of blood received in culture bottles   Culture   Final    NO GROWTH 5 DAYS Performed at [facility], [address] [place], [place]  [address]    Report Status 03/17/2022 FINAL  Final  Blood culture (routine x 2)     Status: None   Collection Time: 03/12/22  8:25 PM   Specimen: BLOOD  Result Value Ref Range Status   Specimen Description BLOOD BLOOD LEFT HAND  Final   Special Requests   Final    BOTTLES DRAWN AEROBIC AND ANAEROBIC Blood Culture results may not be optimal due to an inadequate volume of blood received in culture bottles   Culture   Final    NO GROWTH 5 DAYS Performed at [facility], [address], [place], [place] [address]    Report Status 03/17/2022 FINAL  Final  Expectorated Sputum Assessment w Gram Stain, Rflx to Resp Cult     Status: None   Collection Time: 03/13/22 10:40 AM   Specimen: Expectorated Sputum  Result Value Ref Range Status   Specimen Description EXPECTORATED SPUTUM  Final   Special Requests NONE  Final   Sputum evaluation   Final    THIS SPECIMEN IS ACCEPTABLE FOR SPUTUM CULTURE Performed at [facility], [address], [place], [place] [address]    Report Status 03/13/2022 FINAL  Final  Culture, Respiratory w Gram Stain     Status: None   Collection Time: 03/13/22 10:40 AM  Result Value Ref Range Status   Specimen Description   Final    EXPECTORATED SPUTUM Performed at [facility], [address] [place], [place] [address]    Special Requests   Final    NONE Reflexed from [id number] Performed at [facility], [address] [place], [place] [address]    Gram Stain   Final    ABUNDANT WBC PRESENT, PREDOMINANTLY PMN ABUNDANT GRAM NEGATIVE RODS FEW GRAM POSITIVE COCCI RARE BUDDING YEAST SEEN    Culture   Final    MODERATE ESCHERICHIA COLI FEW ENTEROBACTER CLOACAE NO STAPHYLOCOCCUS AUREUS ISOLATED No Pseudomonas species isolated Performed at [facility], [place] [address], [place], [place] [address]    Report Status 03/18/2022 FINAL  Final   Organism ID, Bacteria ENTEROBACTER CLOACAE  Final   Organism ID, Bacteria  ESCHERICHIA COLI  Final      Susceptibility   Enterobacter cloacae - MIC*    CEFAZOLIN >=64 RESISTANT Resistant     CEFEPIME <=0.12 SENSITIVE Sensitive     CEFTAZIDIME <=1 SENSITIVE Sensitive     CIPROFLOXACIN <=0.25 SENSITIVE Sensitive     GENTAMICIN <=1 SENSITIVE Sensitive     IMIPENEM 0.5 SENSITIVE Sensitive     TRIMETH/SULFA <=20 SENSITIVE Sensitive     PIP/TAZO <=4 SENSITIVE Sensitive     * FEW ENTEROBACTER CLOACAE   Escherichia coli - MIC*    AMPICILLIN >=32 RESISTANT Resistant     CEFAZOLIN <=4 SENSITIVE Sensitive     CEFEPIME <=0.12 SENSITIVE Sensitive     CEFTAZIDIME <=1 SENSITIVE Sensitive     CEFTRIAXONE <=0.25 SENSITIVE Sensitive     CIPROFLOXACIN <=0.25 SENSITIVE Sensitive     GENTAMICIN <=1 SENSITIVE Sensitive     IMIPENEM <=0.25 SENSITIVE Sensitive     TRIMETH/SULFA <=20 SENSITIVE Sensitive     AMPICILLIN/SULBACTAM 16 INTERMEDIATE Intermediate     * MODERATE ESCHERICHIA COLI  Urine Culture     Status: Abnormal   Collection Time: 03/13/22 10:43 AM   Specimen: Urine, Catheterized  Result Value Ref Range Status   Specimen Description   Final    URINE, CATHETERIZED Performed at [facility], [address], [place], [place] [address]    Special Requests   Final    NONE Performed at [facility], [address] [place], [place] [address]    Culture MULTIPLE SPECIES PRESENT, SUGGEST RECOLLECTION (A)  Final   Report Status 03/14/2022 FINAL  Final  Aerobic/Anaerobic Culture w Gram Stain (surgical/deep wound)     Status: None (Preliminary result)   Collection Time: 03/14/22 11:36 AM   Specimen: PATH Other; Body Fluid  Result Value Ref Range Status   Specimen Description   Final    HIP LEFT FEMORAL NECK FRACTURE Performed at [facility], [address], [place], [place] [address]    Special Requests   Final    NONE Performed at [facility], [address] [place], [place] [address]    Gram Stain   Final    RARE WBC  PRESENT, PREDOMINANTLY MONONUCLEAR NO ORGANISMS SEEN    Culture   Final    NO GROWTH 4 DAYS NO ANAEROBES ISOLATED; CULTURE IN PROGRESS FOR 5 DAYS Performed at [facility] [address] [place], [place] [address]    Report Status PENDING  Incomplete  Aerobic/Anaerobic Culture w Gram Stain (surgical/deep wound)     Status: None (Preliminary result)   Collection Time: 03/14/22 11:57 AM   Specimen: PATH Other; Body Fluid  Result Value Ref Range Status   Specimen Description   Final    HIP LEFT FEMORAL FLUID Performed at [facility], [address] [place], [place] [address]    Special Requests   Final    NONE Performed at [facility], [place], [place] [address]    Gram Stain NO WBC SEEN NO ORGANISMS SEEN   Final   Culture   Final    NO GROWTH 4 DAYS NO ANAEROBES ISOLATED; CULTURE IN PROGRESS FOR 5 DAYS Performed at [facility], [place] [address] [place], [place] [address]    Report Status PENDING  Incomplete     Labs: BNP (last 3 results) No results for input(s): "BNP" in the last 8760 hours. Basic Metabolic Panel: Recent  Labs  Lab 03/14/22 0555 03/14/22 0915 03/15/22 0445 03/16/22 0330 03/17/22 0633 03/18/22 0335 03/19/22 0612  NA  --    < > 131* 133* 128* 124* 129*  K  --    < > 5.4* 3.6 4.0 3.9 3.9  CL  --    < > 105 100 97* 93* 100  CO2  --    < > 23 22 21* 20* 20*  GLUCOSE  --    < > 146* 130* 96 94 111*  BUN  --    < > 11 17 25* 26* 21  CREATININE  --    < > 0.52 0.93 1.32* 1.19* 0.71  CALCIUM  --    < > 6.6* 7.3* 7.7* 7.5* 7.9*  MG 1.9  --   --   --   --   --   --   PHOS 2.9  --   --   --   --   --   --    < > = values in this interval not displayed.   Liver Function Tests: Recent Labs  Lab 03/12/22 2025 03/13/22 0149 03/14/22 0915 03/15/22 0445  AST 53* 53* 38 38  ALT _0 ALKPHOS 174* 187* 149* 119  BILITOT 1.0 0.7 0.8 0.7  PROT 5.5* 5.8* 5.1* 4.1*  ALBUMIN 2.5* 2.5* 2.2* 1.9*   No  results for input(s): "LIPASE", "AMYLASE" in the last 168 hours. No results for input(s): "AMMONIA" in the last 168 hours. CBC: Recent Labs  Lab 03/12/22 2025 03/13/22 0149 03/14/22 0555 03/15/22 0445 03/15/22 1800 03/16/22 0330 03/17/22 0633 03/19/22 0612  WBC 9.6   < > 5.3 8.3  --  7.7 8.6 9.0  NEUTROABS 7.5  --  3.7 6.5  --   --   --   --   HGB 8.2*   < > 9.6* 7.3* 12.2 11.4* 11.0* 11.5*  HCT 25.8*   < > 28.7* 22.2* 36.1 33.4* 32.4* 33.8*  MCV 92.8   < > 88.6 91.0  --  87.9 88.5 88.0  PLT 498*   < > 396 289  --  278 243 254   < > = values in this interval not displayed.   Cardiac Enzymes: No results for input(s): "CKTOTAL", "CKMB", "CKMBINDEX", "TROPONINI" in the last 168 hours. BNP: Invalid input(s): "POCBNP" CBG: No results for input(s): "GLUCAP" in the last 168 hours. D-Dimer No results for input(s): "DDIMER" in the last 72 hours. Hgb A1c No results for input(s): "HGBA1C" in the last 72 hours. Lipid Profile No results for input(s): "CHOL", "HDL", "LDLCALC", "TRIG", "CHOLHDL", "LDLDIRECT" in the last 72 hours. Thyroid function studies No results for input(s): "TSH", "T4TOTAL", "T3FREE", "THYROIDAB" in the last 72 hours.  Invalid input(s): "FREET3" Anemia work up No results for input(s): "VITAMINB12", "FOLATE", "FERRITIN", "TIBC", "IRON", "RETICCTPCT" in the last 72 hours. Urinalysis    Component Value Date/Time   COLORURINE AMBER (A) 03/13/2022 1350   APPEARANCEUR CLOUDY (A) 03/13/2022 1350   LABSPEC 1.016 03/13/2022 1350   PHURINE 5.0 03/13/2022 1350   GLUCOSEU NEGATIVE 03/13/2022 1350   HGBUR MODERATE (A) 03/13/2022 1350   BILIRUBINUR NEGATIVE 03/13/2022 1350   KETONESUR NEGATIVE 03/13/2022 1350   PROTEINUR NEGATIVE 03/13/2022 1350   NITRITE POSITIVE (A) 03/13/2022 1350   LEUKOCYTESUR MODERATE (A) 03/13/2022 1350   Sepsis Labs Recent Labs  Lab 03/15/22 0445 03/16/22 0330 03/17/22 0633 03/19/22 0612  WBC 8.3 7.7 8.6 9.0   Microbiology Recent  Results (from the  past 240 hour(s))  Blood culture (routine x 2)     Status: None   Collection Time: 03/12/22  8:25 PM   Specimen: BLOOD  Result Value Ref Range Status   Specimen Description BLOOD BLOOD LEFT FOREARM  Final   Special Requests   Final    BOTTLES DRAWN AEROBIC AND ANAEROBIC Blood Culture results may not be optimal due to an inadequate volume of blood received in culture bottles   Culture   Final    NO GROWTH 5 DAYS Performed at [facility], [address] [place], [place] [address]    Report Status 03/17/2022 FINAL  Final  Blood culture (routine x 2)     Status: None   Collection Time: 03/12/22  8:25 PM   Specimen: BLOOD  Result Value Ref Range Status   Specimen Description BLOOD BLOOD LEFT HAND  Final   Special Requests   Final    BOTTLES DRAWN AEROBIC AND ANAEROBIC Blood Culture results may not be optimal due to an inadequate volume of blood received in culture bottles   Culture   Final    NO GROWTH 5 DAYS Performed at [facility], [address] [place], [place] [address]    Report Status 03/17/2022 FINAL  Final  Expectorated Sputum Assessment w Gram Stain, Rflx to Resp Cult     Status: None   Collection Time: 03/13/22 10:40 AM   Specimen: Expectorated Sputum  Result Value Ref Range Status   Specimen Description EXPECTORATED SPUTUM  Final   Special Requests NONE  Final   Sputum evaluation   Final    THIS SPECIMEN IS ACCEPTABLE FOR SPUTUM CULTURE Performed at [facility], [address] [place], [place] [address]    Report Status 03/13/2022 FINAL  Final  Culture, Respiratory w Gram Stain     Status: None   Collection Time: 03/13/22 10:40 AM  Result Value Ref Range Status   Specimen Description   Final    EXPECTORATED SPUTUM Performed at [facility], [address] [place], [place] [address]    Special Requests   Final    NONE Reflexed from [id number] Performed at [facility], [address]  [place], [place] [address]    Gram Stain   Final    ABUNDANT WBC PRESENT, PREDOMINANTLY PMN ABUNDANT GRAM NEGATIVE RODS FEW GRAM POSITIVE COCCI RARE BUDDING YEAST SEEN    Culture   Final    MODERATE ESCHERICHIA COLI FEW ENTEROBACTER CLOACAE NO STAPHYLOCOCCUS AUREUS ISOLATED No Pseudomonas species isolated Performed at [facility], [place] [address] [place], [place] [address]    Report Status 03/18/2022 FINAL  Final   Organism ID, Bacteria ENTEROBACTER CLOACAE  Final   Organism ID, Bacteria ESCHERICHIA COLI  Final      Susceptibility   Enterobacter cloacae - MIC*    CEFAZOLIN >=64 RESISTANT Resistant     CEFEPIME <=0.12 SENSITIVE Sensitive     CEFTAZIDIME <=1 SENSITIVE Sensitive     CIPROFLOXACIN <=0.25 SENSITIVE Sensitive     GENTAMICIN <=1 SENSITIVE Sensitive     IMIPENEM 0.5 SENSITIVE Sensitive     TRIMETH/SULFA <=20 SENSITIVE Sensitive     PIP/TAZO <=4 SENSITIVE Sensitive     * FEW ENTEROBACTER CLOACAE   Escherichia coli - MIC*    AMPICILLIN >=32 RESISTANT Resistant     CEFAZOLIN <=4 SENSITIVE Sensitive     CEFEPIME <=0.12 SENSITIVE Sensitive     CEFTAZIDIME <=1 SENSITIVE Sensitive     CEFTRIAXONE <=0.25  SENSITIVE Sensitive     CIPROFLOXACIN <=0.25 SENSITIVE Sensitive     GENTAMICIN <=1 SENSITIVE Sensitive     IMIPENEM <=0.25 SENSITIVE Sensitive     TRIMETH/SULFA <=20 SENSITIVE Sensitive     AMPICILLIN/SULBACTAM 16 INTERMEDIATE Intermediate     * MODERATE ESCHERICHIA COLI  Urine Culture     Status: Abnormal   Collection Time: 03/13/22 10:43 AM   Specimen: Urine, Catheterized  Result Value Ref Range Status   Specimen Description   Final    URINE, CATHETERIZED Performed at [facility], [address] [place], [place] [address]    Special Requests   Final    NONE Performed at [facility], [address] [place], [place] [address]    Culture MULTIPLE SPECIES PRESENT, SUGGEST RECOLLECTION (A)  Final   Report Status 03/14/2022 FINAL  Final   Aerobic/Anaerobic Culture w Gram Stain (surgical/deep wound)     Status: None (Preliminary result)   Collection Time: 03/14/22 11:36 AM   Specimen: PATH Other; Body Fluid  Result Value Ref Range Status   Specimen Description   Final    HIP LEFT FEMORAL NECK FRACTURE Performed at [facility], [address], [place], [place] [address]    Special Requests   Final    NONE Performed at [facility], [address] [place], [place] [address]    Gram Stain   Final    RARE WBC PRESENT, PREDOMINANTLY MONONUCLEAR NO ORGANISMS SEEN    Culture   Final    NO GROWTH 4 DAYS NO ANAEROBES ISOLATED; CULTURE IN PROGRESS FOR 5 DAYS Performed at [facility] [address], [place], [place] [address]    Report Status PENDING  Incomplete  Aerobic/Anaerobic Culture w Gram Stain (surgical/deep wound)     Status: None (Preliminary result)   Collection Time: 03/14/22 11:57 AM   Specimen: PATH Other; Body Fluid  Result Value Ref Range Status   Specimen Description   Final    HIP LEFT FEMORAL FLUID Performed at [facility], [address] [address], [place], [place] [address]    Special Requests   Final    NONE Performed at [facility], [place], [place] [address]    Gram Stain NO WBC SEEN NO ORGANISMS SEEN   Final   Culture   Final    NO GROWTH 4 DAYS NO ANAEROBES ISOLATED; CULTURE IN PROGRESS FOR 5 DAYS Performed at [facility], [place] [address] [place], [place] [address]    Report Status PENDING  Incomplete   Imaging DG Hip Port Unilat With Pelvis 1V Left  Result Date: 03/14/2022 CLINICAL DATA:  Status post left hip replacement EXAM: DG HIP (WITH OR WITHOUT PELVIS) 1V PORT LEFT COMPARISON:  None Available. FINDINGS: Interval postsurgical changes from left total hip arthroplasty for left femoral neck fracture. Arthroplasty components appear in their expected alignment. 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 Expected postoperative changes within the overlying soft tissues. IMPRESSION: Postsurgical changes from left total hip arthroplasty. Electronically Signed   By: Yetta Glassman M.D.   On: 03/14/2022 14:54   DG Knee Complete 4 Views Right  Result Date: 03/13/2022 CLINICAL DATA:  Bilateral knee pain EXAM: RIGHT KNEE - COMPLETE 4+ VIEW COMPARISON:  None Available. FINDINGS: Alignment is anatomic. No joint effusion. No acute fracture. No significant joint space narrowing. Vascular calcifications. IMPRESSION: No significant osseous abnormality. Electronically Signed   By: Addison Lank.D.  On: 03/13/2022 13:05   DG Knee Complete 4 Views Left  Result Date: 03/13/2022 CLINICAL DATA:  Left knee injury. EXAM: LEFT KNEE - COMPLETE 4+ VIEW COMPARISON:  None Available. FINDINGS: No evidence of fracture, dislocation, or joint effusion. No evidence of arthropathy or other focal bone abnormality. Soft tissue edema superior to the left knee. IMPRESSION: 1. No acute fracture or dislocation identified about the left knee. 2. Soft tissue edema superior to the left knee. Electronically Signed   By: Fidela Salisbury M.D.   On: 03/13/2022 13:04   ECHOCARDIOGRAM COMPLETE  Result Date: 03/13/2022    ECHOCARDIOGRAM REPORT   Patient Name:   Kathleen Blankenship [facility] Date of Exam: 03/13/2022 Medical Rec #:  [id number]      Height:       61.0 in Accession #:    [id number]     Weight:       112.4 lb Date of Birth:  [birth year]      BSA:          1.479 m Patient Age:    80 years       BP:           126/79 mmHg Patient Gender: F              HR:           100 bpm. Exam Location:  [facility] Procedure: 2D Echo, Color Doppler and Cardiac Doppler Indications:     CAD native vessel I25.10  History:         Patient has prior history of Echocardiogram examinations, most                  recent 12/06/2021. CAD; Risk Factors:Hypertension and Current                  Smoker. Pneumonia.  Sonographer:     Sherrie Sport Referring  Phys:  [id number] [place] TANG Diagnosing Phys: Yolonda Kida MD  Sonographer Comments: Suboptimal apical window and suboptimal subcostal window. Image acquisition challenging due to COPD. IMPRESSIONS  1. Left ventricular ejection fraction, by estimation, is 65 to 70%. The left ventricle has normal function. The left ventricle has no regional wall motion abnormalities. There is mild left ventricular hypertrophy. Left ventricular diastolic parameters are consistent with Grade I diastolic dysfunction (impaired relaxation).  2. Right ventricular systolic function is normal. The right ventricular size is normal.  3. The mitral valve is normal in structure. Trivial mitral valve regurgitation.  4. The aortic valve is normal in structure. Aortic valve regurgitation is not visualized. FINDINGS  Left Ventricle: Left ventricular ejection fraction, by estimation, is 65 to 70%. The left ventricle has normal function. The left ventricle has no regional wall motion abnormalities. The left ventricular internal cavity size was normal in size. There is  mild left ventricular hypertrophy. Left ventricular diastolic parameters are consistent with Grade I diastolic dysfunction (impaired relaxation). Right Ventricle: The right ventricular size is normal. No increase in right ventricular wall thickness. Right ventricular systolic function is normal. Left Atrium: Left atrial size was normal in size. Right Atrium: Right atrial size was normal in size. Pericardium: There is no evidence of pericardial effusion. Mitral Valve: The mitral valve is normal in structure. Trivial mitral valve regurgitation. Tricuspid Valve: The tricuspid valve is normal in structure. Tricuspid valve regurgitation is trivial. Aortic Valve: The aortic valve is normal in structure. Aortic valve regurgitation is not visualized. Aortic valve mean gradient measures 4.5 mmHg. Aortic valve  peak gradient measures 8.2 mmHg. Aortic valve area, by VTI measures 3.48 cm.  Pulmonic Valve: The pulmonic valve was normal in structure. Pulmonic valve regurgitation is not visualized. Aorta: The ascending aorta was not well visualized. IAS/Shunts: No atrial level shunt detected by color flow Doppler.  LEFT VENTRICLE PLAX 2D LVIDd:         3.30 cm   Diastology LVIDs:         2.10 cm   LV e' medial:    4.68 cm/s LV PW:         1.20 cm   LV E/e' medial:  19.7 LV IVS:        1.20 cm   LV e' lateral:   13.30 cm/s LVOT diam:     2.00 cm   LV E/e' lateral: 6.9 LV SV:         70 LV SV Index:   47 LVOT Area:     3.14 cm  RIGHT VENTRICLE RV Basal diam:  3.80 cm RV S prime:     13.60 cm/s TAPSE (M-mode): 1.6 cm LEFT ATRIUM             Index        RIGHT ATRIUM           Index LA diam:        2.10 cm 1.42 cm/m   RA Area:     13.80 cm LA Vol (A2C):   15.3 ml 10.35 ml/m  RA Volume:   35.30 ml  23.87 ml/m LA Vol (A4C):   22.2 ml 15.01 ml/m LA Biplane Vol: 19.6 ml 13.26 ml/m  AORTIC VALVE AV Area (Vmax):    2.65 cm AV Area (Vmean):   2.73 cm AV Area (VTI):     3.48 cm AV Vmax:           143.50 cm/s AV Vmean:          94.750 cm/s AV VTI:            0.202 m AV Peak Grad:      8.2 mmHg AV Mean Grad:      4.5 mmHg LVOT Vmax:         121.00 cm/s LVOT Vmean:        82.200 cm/s LVOT VTI:          0.223 m LVOT/AV VTI ratio: 1.11  AORTA Ao Root diam: 3.57 cm MITRAL VALVE                TRICUSPID VALVE MV Area (PHT): 3.63 cm     TR Peak grad:   40.4 mmHg MV Decel Time: 209 msec     TR Vmax:        318.00 cm/s MV E velocity: 92.10 cm/s MV A velocity: 102.00 cm/s  SHUNTS MV E/A ratio:  0.90         Systemic VTI:  0.22 m                             Systemic Diam: 2.00 cm Yolonda Kida MD Electronically signed by Yolonda Kida MD Signature Date/Time: 03/13/2022/12:56:43 PM    Final    [place] EKG SITE RITE  Result Date: 03/13/2022 If Site Rite image not attached, placement could not be confirmed due to current cardiac rhythm.  DG HIP UNILAT WITH PELVIS 2-3 VIEWS LEFT  Result Date:  03/13/2022 CLINICAL DATA:  Left hip fracture. EXAM:  DG HIP (WITH OR WITHOUT PELVIS) 2-3V LEFT COMPARISON:  CT yesterday. FINDINGS: Displaced left femoral neck fracture with likely underlying lytic lesion. Femoral head remains seated. There is proximal migration of the femoral shaft. The question pathologic fracture at the right puboacetabular junction is tentatively visualized. Many of the additional bone lesions on CT are not well seen by radiograph. There is excreted IV contrast in the urinary bladder which appears trabeculated. IMPRESSION: Displaced left femoral neck fracture with likely underlying lytic lesion. Possible pathologic fracture at the right puboacetabular junction on CT is tentatively visualized. Many of the additional bone lesions are not well seen by radiograph. Electronically Signed   By: Keith Rake M.D.   On: 03/13/2022 01:03      Time coordinating discharge: Over 30 minutes  SIGNED:  Emeterio Reeve DO [facility]

## 2022-03-19 NOTE — Progress Notes (Signed)
Occupational Therapy Treatment Patient Details Name: Kathleen Blankenship MRN: [id number] DOB: 12-13-1950 Today's Date: 03/19/2022   History of present illness Pt is a 71 y.o. Female that presente to the ED for back pain, urinary retention. 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 Underwent L hemiarthroplasty 7/20. PMH of HTN, CAD, anxiety, cholecystectomy complicated by brief PEA cardiac arrest.   OT comments  [name] received in bed, reporting 6/10 pain in L hip and overall. Provided educ and assistance for using bedrails, UE, for repositioning in bed without causing increased pain; how to use bed mobility, pillows, blankets to find more comfortable positions and to prevent further skin breakdown on sacrum. Provided Min-Mod A for grooming at bed level, educated pt as to how she can assist in attending to her grooming needs, if she so desires, w/o increased pain. Notified RN re: pt endorsing 6/10 pain, requesting pt be given pain meds prior to transport to Kathleen Blankenship. RN confirms.   Recommendations for follow up therapy are one component of a multi-disciplinary discharge planning process, led by the attending physician.  Recommendations may be updated based on patient status, additional functional criteria and insurance authorization.    Follow Up Recommendations  Other (comment) (inpatient hospice)    Assistance Recommended at Discharge Frequent or constant Supervision/Assistance  Patient can return home with the following  A lot of help with walking and/or transfers;A lot of help with bathing/dressing/bathroom   Equipment Recommendations       Recommendations for Other Services      Precautions / Restrictions Precautions Precautions: Fall;Posterior Hip Restrictions Weight Bearing Restrictions: Yes LLE Weight  Bearing: Weight bearing as tolerated       Mobility Bed Mobility Overal bed mobility: Needs Assistance Bed Mobility: Rolling Rolling: Mod assist         General bed mobility comments: Mod A, w/ attn to pain mgmt    Transfers                   General transfer comment: pt declined     Balance Overall balance assessment: Needs assistance                                         ADL either performed or assessed with clinical judgement   ADL Overall ADL's : Needs assistance/impaired     Grooming: Wash/dry face;Wash/dry hands;Brushing hair;Bed level;Set up;Minimal assistance;Oral care                                      Extremity/Trunk Assessment Upper Extremity Assessment Upper Extremity Assessment: Generalized weakness   Lower Extremity Assessment Lower Extremity Assessment: Generalized weakness        Vision       Perception     Praxis      Cognition Arousal/Alertness: Awake/alert, Lethargic Behavior During Therapy: WFL for tasks assessed/performed Overall Cognitive Status: Within Functional Limits for tasks assessed                                          Exercises  Shoulder Instructions       General Comments      Pertinent Vitals/ Pain       Pain Assessment Pain Score: 6  Pain Location: L hip Pain Descriptors / Indicators: Aching Pain Intervention(s): Limited activity within patient's tolerance, Repositioned, Utilized relaxation techniques  Home Living                                          Prior Functioning/Environment              Frequency           Progress Toward Goals  OT Goals(current goals can now be found in the care plan section)  Progress towards OT goals: Progressing toward goals  Acute Rehab OT Goals OT Goal Formulation: With patient Time For Goal Achievement: 03/29/22 Potential to Achieve Goals: Good  Plan Discharge plan  needs to be updated;Frequency remains appropriate    Co-evaluation                 AM-PAC OT "6 Clicks" Daily Activity     Outcome Measure   Help from another person eating meals?: A Little Help from another person taking care of personal grooming?: A Lot Help from another person toileting, which includes using toliet, bedpan, or urinal?: A Lot Help from another person bathing (including washing, rinsing, drying)?: A Lot Help from another person to put on and taking off regular upper body clothing?: A Lot Help from another person to put on and taking off regular lower body clothing?: A Lot 6 Click Score: 13    End of Session    OT Visit Diagnosis: Unsteadiness on feet (R26.81);Muscle weakness (generalized) (M62.81);Pain   Activity Tolerance Patient tolerated treatment well;Patient limited by lethargy   Patient Left in bed;with call bell/phone within reach;with bed alarm set;with family/visitor present   Nurse Communication          Time: 1100-1110 OT Time Calculation (min): 10 min  Charges: OT General Charges $OT Visit: 1 Visit OT Treatments $Self Care/Home Management : 8-22 mins Josiah Lobo, PhD, MS, OTR/L 03/19/22, 12:38 PM

## 2022-03-19 NOTE — Progress Notes (Signed)
PT Cancellation Note  Patient Details Name: LORIN HAUCK MRN: [id number] DOB: 02-12-1951   Cancelled Treatment:    Reason Eval/Treat Not Completed: Patient declined participation with PT services, even gentle supine therex, secondary to fatigue. Friend in room stated pt has not been eating and has not been awake yet today.  Will attempt to see pt at a future date/time as medically appropriate.    Linus Salmons PT, DPT 03/19/22, 10:34 AM

## 2022-03-19 NOTE — TOC Transition Note (Signed)
Transition of Care [facility]) - CM/SW Discharge Note   Patient Details  Name: Kathleen Blankenship MRN: [id number] Date of Birth: Aug 01, 1951  Transition of Care [facility]) CM/SW Contact:  Alberteen Sam, LCSW Phone Number: 03/19/2022, 10:30 AM   Clinical Narrative:      Patient to dc to [facility] today, RN to call report to [phone]. , Lorayne Bender with [facility] has arranged EMS for 12:30 pick up and EMS forms are on chart.   Final next level of care: [place] Barriers to Discharge: No Barriers Identified   Patient Goals and CMS Choice Patient states their goals for this hospitalization and ongoing recovery are:: to go home CMS Medicare.gov Compare Post Acute Care list provided to:: Patient Choice offered to / list presented to : Patient  Discharge Placement                       Discharge Plan and Services     Post Acute Care Choice: Resumption of Svcs/PTA Provider                               Social Determinants of Health (SDOH) Interventions     Readmission Risk Interventions    07/31/2020    1:24 PM  Readmission Risk Prevention Plan  Post Dischage Appt Complete  Medication Screening Complete  Transportation Screening Complete

## 2022-03-19 NOTE — Progress Notes (Signed)
[facility])   Consent forms to be completed @ 10:30 a.m..  EMS notified of patient D/C and transport arranged for 12:30 p.m. TOC/[place] and Attending Physician/Dr. Sheppard Coil also notified of transport arrangement.    Please send signed DNR form with patient and RN call report to [phone].    Daphene Calamity, MSW [facility] Liaison [phone]

## 2022-03-20 ENCOUNTER — Telehealth: Payer: Self-pay | Admitting: Hospice and Palliative Medicine

## 2022-03-20 NOTE — Telephone Encounter (Signed)
Called and spoke with patient's sister.  Updated her on the results of the biopsy.  Patient is now at hospice and sister states that she is rapidly declining.  They are focusing on just keeping her comfortable.

## 2022-03-21 ENCOUNTER — Encounter: Payer: Medicare HMO | Admitting: Physical Therapy

## 2022-03-25 ENCOUNTER — Encounter: Payer: Medicare HMO | Admitting: Physical Therapy

## 2022-03-26 DEATH — deceased

## 2022-03-28 ENCOUNTER — Encounter: Payer: Medicare HMO | Admitting: Physical Therapy

## 2022-04-01 ENCOUNTER — Encounter: Payer: Medicare HMO | Admitting: Physical Therapy

## 2022-04-03 ENCOUNTER — Encounter: Payer: Medicare HMO | Admitting: Physical Therapy

## 2022-04-04 ENCOUNTER — Ambulatory Visit: Payer: Medicare HMO | Admitting: Neurosurgery

## 2022-04-08 ENCOUNTER — Encounter: Payer: Medicare HMO | Admitting: Physical Therapy

## 2022-04-10 ENCOUNTER — Encounter: Payer: Medicare HMO | Admitting: Physical Therapy

## 2022-04-15 ENCOUNTER — Encounter: Payer: Medicare HMO | Admitting: Physical Therapy

## 2022-04-17 ENCOUNTER — Encounter: Payer: Medicare HMO | Admitting: Physical Therapy

## 2022-04-22 ENCOUNTER — Encounter: Payer: Medicare HMO | Admitting: Physical Therapy

## 2022-04-25 ENCOUNTER — Encounter: Payer: Medicare HMO | Admitting: Physical Therapy
